# Patient Record
Sex: Female | Born: 1970
Health system: Southern US, Community
[De-identification: ages and names within clinical notes are randomized; demographics above are authoritative.]

## PROBLEM LIST (undated history)

## (undated) DIAGNOSIS — K649 Unspecified hemorrhoids: Secondary | ICD-10-CM

## (undated) DIAGNOSIS — A549 Gonococcal infection, unspecified: Secondary | ICD-10-CM

## (undated) DIAGNOSIS — F32A Depression, unspecified: Secondary | ICD-10-CM

## (undated) DIAGNOSIS — F419 Anxiety disorder, unspecified: Secondary | ICD-10-CM

## (undated) DIAGNOSIS — K219 Gastro-esophageal reflux disease without esophagitis: Secondary | ICD-10-CM

## (undated) DIAGNOSIS — D649 Anemia, unspecified: Secondary | ICD-10-CM

## (undated) DIAGNOSIS — A749 Chlamydial infection, unspecified: Secondary | ICD-10-CM

## (undated) DIAGNOSIS — I10 Essential (primary) hypertension: Secondary | ICD-10-CM

## (undated) DIAGNOSIS — R0600 Dyspnea, unspecified: Secondary | ICD-10-CM

## (undated) DIAGNOSIS — M1990 Unspecified osteoarthritis, unspecified site: Secondary | ICD-10-CM

## (undated) HISTORY — PX: TUBAL LIGATION: SHX77

## (undated) HISTORY — PX: TONSILLECTOMY: SUR1361

## (undated) SURGERY — EGD (ESOPHAGOGASTRODUODENOSCOPY)
Anesthesia: Moderate Sedation

## (undated) NOTE — *Deleted (*Deleted)
HEMATOLOGY/ONCOLOGY CONSULTATION NOTE  Date of Service: 03/21/2020  Patient Care Team: Marcine Matar, MD as PCP - General (Internal Medicine)  CHIEF COMPLAINTS/PURPOSE OF CONSULTATION:  IDA  HISTORY OF PRESENTING ILLNESS:  Elizabeth Bush is a wonderful 31 y.o. female who has been referred to [place] by Dr. Laural Benes for evaluation and management of iron deficiency anemia. Pt is accompanied today by ***. The pt reports that she is doing well overall.   The pt reports ***   Of note prior to the patient's visit today, pt has had *** completed on *** with results revealing ***.   Most recent lab results (***) of CBC is as follows: all values are WNL except for ***.  On review of systems, pt reports *** and denies ***and any other symptoms.   On PMHx the pt reports ***. On Social Hx the pt reports *** On Family Hx the pt reports ***  A: -Discussed patient's most recent labs from ***, *** -***  MEDICAL HISTORY:  Past Medical History:  Diagnosis Date  . Chlamydia   . Gonorrhea   . Hypertension     SURGICAL HISTORY: Past Surgical History:  Procedure Laterality Date  . COLONOSCOPY  05/31/2011   Procedure: COLONOSCOPY;  Surgeon: [name], MD;  Location: [facility];  Service: Endoscopy;  Laterality: N/A;  . ESOPHAGOGASTRODUODENOSCOPY  05/30/2011   Procedure: ESOPHAGOGASTRODUODENOSCOPY (EGD);  Surgeon: [name], MD;  Location: [facility];  Service: Endoscopy;  Laterality: N/A;  . GIVENS CAPSULE STUDY  06/01/2011   Procedure: GIVENS CAPSULE STUDY;  Surgeon: [name], MD;  Location: [facility];  Service: Endoscopy;  Laterality: N/A;  . TONSILLECTOMY    . TUBAL LIGATION      SOCIAL HISTORY: Social History   Socioeconomic History  . Marital status: Single    Spouse name: Not on file  . Number of children: Not on file  . Years of education: Not on file  . Highest education level: Not on file  Occupational History  . Not on file  Tobacco Use  .  Smoking status: Current Some Day Smoker    Packs/day: 0.25    Years: 5.00    Pack years: 1.25    Types: Cigarettes    Last attempt to quit: 11/04/2016    Years since quitting: 3.3  . Smokeless tobacco: Never Used  Substance and Sexual Activity  . Alcohol use: Yes    Alcohol/week: 6.0 standard drinks    Types: 6 Cans of beer per week    Comment: 6 40 oz beers/day  . Drug use: No  . Sexual activity: Yes    Birth control/protection: Other-see comments, Surgical  Other Topics Concern  . Not on file  Social History Narrative  . Not on file   Social Determinants of Health   Financial Resource Strain:   . Difficulty of Paying Living Expenses: Not on file  Food Insecurity: Food Insecurity Present  . Worried About Programme researcher, broadcasting/film/video in the Last Year: Often true  . Ran Out of Food in the Last Year: Often true  Transportation Needs: Unmet Transportation Needs  . Lack of Transportation (Medical): Yes  . Lack of Transportation (Non-Medical): Yes  Physical Activity:   . Days of Exercise per Week: Not on file  . Minutes of Exercise per Session: Not on file  Stress:   . Feeling of Stress : Not on file  Social Connections:   . Frequency of Communication with Friends and Family:  Not on file  . Frequency of Social Gatherings with Friends and Family: Not on file  . Attends Religious Services: Not on file  . Active Member of Clubs or Organizations: Not on file  . Attends Banker Meetings: Not on file  . Marital Status: Not on file  Intimate Partner Violence:   . Fear of Current or Ex-Partner: Not on file  . Emotionally Abused: Not on file  . Physically Abused: Not on file  . Sexually Abused: Not on file    FAMILY HISTORY: Family History  Problem Relation Age of Onset  . Diabetes Mother   . Hypertension Other   . Asthma Other     ALLERGIES:  is allergic to lisinopril.  MEDICATIONS:  Current Outpatient Medications  Medication Sig Dispense Refill  . amLODipine  (NORVASC) 10 MG tablet Take 1 tablet (10 mg total) by mouth daily. 90 tablet 3  . celecoxib (CELEBREX) 200 MG capsule Take 1 capsule (200 mg total) by mouth daily. 30 capsule 3  . DULoxetine (CYMBALTA) 30 MG capsule Take 1 capsule (30 mg total) by mouth daily. 30 capsule 5  . ferrous sulfate 325 (65 FE) MG tablet Take 1 tablet (325 mg total) by mouth daily with breakfast. (Patient taking differently: Take 325 mg by mouth 2 (two) times daily with a meal. ) 100 tablet 3  . omeprazole (PRILOSEC) 20 MG capsule Take 1 capsule (20 mg total) by mouth daily. 30 capsule 3  . PEG-KCl-NaCl-NaSulf-Na Asc-C (PLENVU) 140 g SOLR Take 1 kit by mouth as directed. Manufacturer's coupon Universal coupon code:BIN: G6837245; GROUP: [id number]; PCN: [id number]; ID: [id number]; PAY NO MORE $50 1 each 0   No current facility-administered medications for this visit.    REVIEW OF SYSTEMS:    10 Point review of Systems was done is negative except as noted above.  PHYSICAL EXAMINATION: ECOG PERFORMANCE STATUS: {CHL ONC ECOG NF:[id number]}  .There were no vitals filed for this visit. There were no vitals filed for this visit. .There is no height or weight on file to calculate BMI.  *** GENERAL:alert, in no acute distress and comfortable SKIN: no acute rashes, no significant lesions EYES: conjunctiva are pink and non-injected, sclera anicteric OROPHARYNX: MMM, no exudates, no oropharyngeal erythema or ulceration NECK: supple, no JVD LYMPH:  no palpable lymphadenopathy in the cervical, axillary or inguinal regions LUNGS: clear to auscultation b/l with normal respiratory effort HEART: regular rate & rhythm ABDOMEN:  normoactive bowel sounds , non tender, not distended. Extremity: no pedal edema PSYCH: alert & oriented x 3 with fluent speech NEURO: no focal motor/sensory deficits  LABORATORY DATA:  I have reviewed the data as listed  . CBC Latest Ref Rng & Units 02/28/2020 02/06/2020 10/26/2019  WBC 4.0 - 10.5 K/uL  7.2 7.0 7.2  Hemoglobin 12.0 - 15.0 g/dL 9.2 Repeated and verified X2.(L) 8.9(L) 8.4 Repeated and verified X2.(L)  Hematocrit 36 - 46 % 28.3(L) 27.6(L) 26.5(L)  Platelets 150 - 400 K/uL 197.0 303 262.0    . CMP Latest Ref Rng & Units 10/20/2019 09/15/2019 04/02/2017  Glucose 70 - 99 mg/dL 90 92 89  BUN 6 - 23 mg/dL 7 6 5(L)  Creatinine 4.69 - 1.20 mg/dL 6.29 5.28 4.13(K)  Sodium 135 - 145 mEq/L 138 132(L) 137  Potassium 3.5 - 5.1 mEq/L 3.9 4.5 3.7  Chloride 96 - 112 mEq/L 106 100 101  CO2 19 - 32 mEq/L 24 16(L) 19(L)  Calcium 8.4 - 10.5 mg/dL 9.1 9.0 9.0  Total  Protein 6.0 - 8.3 g/dL 9.0(H) 9.0(H) 9.1(H)  Total Bilirubin 0.2 - 1.2 mg/dL 0.4 0.3 0.3  Alkaline Phos 39 - 117 U/L 93 105 105  AST 0 - 37 U/L 69(H) 91(H) 105(H)  ALT 0 - 35 U/L 20 29 33(H)     RADIOGRAPHIC STUDIES: I have personally reviewed the radiological images as listed and agreed with the findings in the report. No results found.  ASSESSMENT & PLAN:   PLAN: ***   FOLLOW UP: ***  All of the patients questions were answered with apparent satisfaction. The patient knows to call the clinic with any problems, questions or concerns.  I spent *** counseling the patient face to face. The total time spent in the appointment was *** and more than 50% was on counseling and direct patient cares.    Wyvonnia Lora MD MS AAHIVMS [facility] Hematology/Oncology Physician [facility]  (Office):       [phone] (Work cell):  [phone] (Fax):           [phone]  03/21/2020 8:07 AM  I, Carollee Herter, am acting as a scribe for Dr. Wyvonnia Lora.   {Add Production assistant, radio Statement}

---

## 1999-01-17 ENCOUNTER — Inpatient Hospital Stay (HOSPITAL_COMMUNITY): Admission: AD | Admit: 1999-01-17 | Discharge: 1999-01-17 | Payer: Self-pay | Admitting: *Deleted

## 1999-07-02 ENCOUNTER — Other Ambulatory Visit: Admission: RE | Admit: 1999-07-02 | Discharge: 1999-07-02 | Payer: Self-pay | Admitting: Obstetrics

## 2000-09-24 ENCOUNTER — Emergency Department (HOSPITAL_COMMUNITY): Admission: EM | Admit: 2000-09-24 | Discharge: 2000-09-25 | Payer: Self-pay | Admitting: Emergency Medicine

## 2000-10-06 ENCOUNTER — Ambulatory Visit (HOSPITAL_BASED_OUTPATIENT_CLINIC_OR_DEPARTMENT_OTHER): Admission: RE | Admit: 2000-10-06 | Discharge: 2000-10-06 | Payer: Self-pay | Admitting: Orthopedic Surgery

## 2003-06-06 ENCOUNTER — Encounter: Admission: RE | Admit: 2003-06-06 | Discharge: 2003-06-06 | Payer: Self-pay | Admitting: Obstetrics

## 2007-08-24 ENCOUNTER — Emergency Department (HOSPITAL_COMMUNITY): Admission: EM | Admit: 2007-08-24 | Discharge: 2007-08-24 | Payer: Self-pay | Admitting: Emergency Medicine

## 2008-07-21 ENCOUNTER — Emergency Department (HOSPITAL_COMMUNITY): Admission: EM | Admit: 2008-07-21 | Discharge: 2008-07-21 | Payer: Self-pay | Admitting: Emergency Medicine

## 2009-01-18 ENCOUNTER — Emergency Department (HOSPITAL_COMMUNITY): Admission: EM | Admit: 2009-01-18 | Discharge: 2009-01-18 | Payer: Self-pay | Admitting: Emergency Medicine

## 2010-06-07 ENCOUNTER — Encounter: Payer: Self-pay | Admitting: Obstetrics

## 2010-08-22 LAB — POCT URINALYSIS DIP (DEVICE)
Bilirubin Urine: NEGATIVE
Glucose, UA: NEGATIVE mg/dL
Ketones, ur: 15 mg/dL — AB
Nitrite: NEGATIVE
Protein, ur: 100 mg/dL — AB
Specific Gravity, Urine: 1.025 (ref 1.005–1.030)
Urobilinogen, UA: 0.2 mg/dL (ref 0.0–1.0)
pH: 5 (ref 5.0–8.0)

## 2010-08-22 LAB — RAPID URINE DRUG SCREEN, HOSP PERFORMED
Amphetamines: NOT DETECTED
Barbiturates: NOT DETECTED
Benzodiazepines: NOT DETECTED
Cocaine: NOT DETECTED
Opiates: NOT DETECTED
Tetrahydrocannabinol: NOT DETECTED

## 2010-08-22 LAB — POCT I-STAT, CHEM 8
BUN: 4 mg/dL — ABNORMAL LOW (ref 6–23)
Calcium, Ion: 1.05 mmol/L — ABNORMAL LOW (ref 1.12–1.32)
Chloride: 103 mEq/L (ref 96–112)
Creatinine, Ser: 0.5 mg/dL (ref 0.4–1.2)
Glucose, Bld: 75 mg/dL (ref 70–99)
HCT: 35 % — ABNORMAL LOW (ref 36.0–46.0)
Hemoglobin: 11.9 g/dL — ABNORMAL LOW (ref 12.0–15.0)
Potassium: 4.1 mEq/L (ref 3.5–5.1)
Sodium: 135 mEq/L (ref 135–145)
TCO2: 19 mmol/L (ref 0–100)

## 2010-08-22 LAB — TSH: TSH: 1.73 u[IU]/mL (ref 0.350–4.500)

## 2010-08-22 LAB — POCT PREGNANCY, URINE: Preg Test, Ur: NEGATIVE

## 2010-08-28 LAB — POCT URINALYSIS DIP (DEVICE)
Bilirubin Urine: NEGATIVE
Glucose, UA: NEGATIVE mg/dL
Ketones, ur: NEGATIVE mg/dL
Nitrite: NEGATIVE
Protein, ur: 30 mg/dL — AB
Specific Gravity, Urine: <=1.005 (ref 1.005–1.030)
Urobilinogen, UA: 0.2 mg/dL (ref 0.0–1.0)
pH: 5.5 (ref 5.0–8.0)

## 2010-08-28 LAB — POCT PREGNANCY, URINE: Preg Test, Ur: NEGATIVE

## 2010-08-28 LAB — GC/CHLAMYDIA PROBE AMP, GENITAL
Chlamydia, DNA Probe: NEGATIVE
GC Probe Amp, Genital: NEGATIVE

## 2010-10-03 NOTE — Op Note (Signed)
[facility]  Patient:    DEVONE, BONILLA                          MRN: [id number] Proc. Date: 10/06/00 Adm. Date:  [id number] Attending:  Marlowe Shores                           Operative Report  PREOPERATIVE DIAGNOSIS:  Right hand web space laceration.  POSTOPERATIVE DIAGNOSIS:  Right hand web space laceration.  PROCEDURE:  Irrigation, debridement, exploration above wound with repair of first dorsal interosseous muscle.  SURGEON:  Artist Pais. Mina Marble, M.D.  ASSISTANT:  Junius Roads. Ireton, P.A.C.  ANESTHESIA:  General.  TOURNIQUET TIME:  40 minutes.  COMPLICATIONS:  None.  DRAINS:  None.  DESCRIPTION:  Patient taken to the operating room where after the induction of adequate general anesthesia the right upper extremity is prepped and draped in usual sterile fashion.  An Esmarch was used to exsanguinate the limb and tourniquet was inflated to 250 mmHg.  At this point in time a large web space laceration of the thumb on the right hand was explored after the sutures placed in the ER were removed.  Exploration revealed intact ulnar neurovascular bundles.  However, there was significant laceration of the first dorsal interosseus muscle and the adductor to the thumb.  These were repaired using 4-0 Vicryl after adequate debridement of nonviable tissues.  The wound was then thoroughly irrigated and closed loosely with 4-0 nylon in a combination of simple and horizontal mattress sutures.  A sterile dressing of Xeroform, 4x4s, fluffs, and a compressive dressing was applied.  Patient tolerated the procedure well, went to recovery room in stable fashion. DD:  10/06/00 TD:  10/07/00 Job: [id number]

## 2011-02-10 LAB — POCT URINALYSIS DIP (DEVICE)
Bilirubin Urine: NEGATIVE
Glucose, UA: NEGATIVE
Ketones, ur: NEGATIVE
Nitrite: NEGATIVE
Operator id: 247071
Protein, ur: NEGATIVE
Specific Gravity, Urine: 1.015
Urobilinogen, UA: 0.2
pH: 5

## 2011-02-10 LAB — GC/CHLAMYDIA PROBE AMP, GENITAL
Chlamydia, DNA Probe: NEGATIVE
GC Probe Amp, Genital: NEGATIVE

## 2011-02-10 LAB — POCT PREGNANCY, URINE
Operator id: 247071
Preg Test, Ur: NEGATIVE

## 2011-02-10 LAB — WET PREP, GENITAL
Clue Cells Wet Prep HPF POC: NONE SEEN
Trich, Wet Prep: NONE SEEN
Yeast Wet Prep HPF POC: NONE SEEN

## 2011-05-27 ENCOUNTER — Inpatient Hospital Stay (HOSPITAL_COMMUNITY)
Admission: AD | Admit: 2011-05-27 | Discharge: 2011-05-27 | Discharge status: Against Medical Advice | Payer: Self-pay | Source: Ambulatory Visit | Attending: Obstetrics & Gynecology | Admitting: Obstetrics & Gynecology

## 2011-05-27 DIAGNOSIS — K625 Hemorrhage of anus and rectum: Secondary | ICD-10-CM | POA: Insufficient documentation

## 2011-05-27 DIAGNOSIS — M549 Dorsalgia, unspecified: Secondary | ICD-10-CM | POA: Insufficient documentation

## 2011-05-27 DIAGNOSIS — R109 Unspecified abdominal pain: Secondary | ICD-10-CM | POA: Insufficient documentation

## 2011-05-27 LAB — COMPREHENSIVE METABOLIC PANEL
ALT: 48 U/L — ABNORMAL HIGH (ref 0–35)
AST: 91 U/L — ABNORMAL HIGH (ref 0–37)
Albumin: 3.4 g/dL — ABNORMAL LOW (ref 3.5–5.2)
Alkaline Phosphatase: 76 U/L (ref 39–117)
BUN: 3 mg/dL — ABNORMAL LOW (ref 6–23)
CO2: 24 mEq/L (ref 19–32)
Calcium: 9.4 mg/dL (ref 8.4–10.5)
Chloride: 105 mEq/L (ref 96–112)
Creatinine, Ser: 0.56 mg/dL (ref 0.50–1.10)
GFR calc Af Amer: >90 mL/min (ref >90)
GFR calc non Af Amer: >90 mL/min (ref >90)
Glucose, Bld: 92 mg/dL (ref 70–99)
Potassium: 3.5 mEq/L (ref 3.5–5.1)
Sodium: 139 mEq/L (ref 135–145)
Total Bilirubin: 0.2 mg/dL — ABNORMAL LOW (ref 0.3–1.2)
Total Protein: 8.5 g/dL — ABNORMAL HIGH (ref 6.0–8.3)

## 2011-05-27 LAB — DIFFERENTIAL
Basophils Absolute: 0.2 10*3/uL — ABNORMAL HIGH (ref 0.0–0.1)
Basophils Relative: 2 % — ABNORMAL HIGH (ref 0–1)
Eosinophils Absolute: 0.4 10*3/uL (ref 0.0–0.7)
Eosinophils Relative: 5 % (ref 0–5)
Lymphocytes Relative: 46 % (ref 12–46)
Lymphs Abs: 3.6 10*3/uL (ref 0.7–4.0)
Monocytes Absolute: 1 10*3/uL (ref 0.1–1.0)
Monocytes Relative: 13 % — ABNORMAL HIGH (ref 3–12)
Neutro Abs: 2.7 10*3/uL (ref 1.7–7.7)
Neutrophils Relative %: 35 % — ABNORMAL LOW (ref 43–77)

## 2011-05-27 LAB — CBC
HCT: 24.1 % — ABNORMAL LOW (ref 36.0–46.0)
Hemoglobin: 7.3 g/dL — ABNORMAL LOW (ref 12.0–15.0)
MCH: 20.5 pg — ABNORMAL LOW (ref 26.0–34.0)
MCHC: 30.3 g/dL (ref 30.0–36.0)
MCV: 67.7 fL — ABNORMAL LOW (ref 78.0–100.0)
Platelets: 419 10*3/uL — ABNORMAL HIGH (ref 150–400)
RBC: 3.56 MIL/uL — ABNORMAL LOW (ref 3.87–5.11)
RDW: 18.4 % — ABNORMAL HIGH (ref 11.5–15.5)
WBC: 7.9 10*3/uL (ref 4.0–10.5)

## 2011-05-27 LAB — POCT PREGNANCY, URINE: Preg Test, Ur: NEGATIVE

## 2011-05-27 NOTE — Plan of Care (Signed)
Patient is not in the lobby when called to a room. Admission staff state the patient left after the last hourly round.

## 2011-05-27 NOTE — Progress Notes (Signed)
Patient states she has had rectal bleeding for 2 years. States the bleeding is daily and occurs spontaneously and saturates everything she is wearing. States she has had abdominal and back pain for the same length of time but was afraid to be evaluated.

## 2011-05-29 ENCOUNTER — Emergency Department (HOSPITAL_COMMUNITY): Payer: Self-pay

## 2011-05-29 ENCOUNTER — Inpatient Hospital Stay (HOSPITAL_COMMUNITY)
Admission: EM | Admit: 2011-05-29 | Discharge: 2011-06-01 | DRG: 378 | Disposition: A | Discharge status: Home / Self Care | Payer: Self-pay | Attending: Internal Medicine | Admitting: Internal Medicine

## 2011-05-29 ENCOUNTER — Encounter (HOSPITAL_COMMUNITY): Payer: Self-pay

## 2011-05-29 DIAGNOSIS — D649 Anemia, unspecified: Secondary | ICD-10-CM

## 2011-05-29 DIAGNOSIS — K625 Hemorrhage of anus and rectum: Principal | ICD-10-CM | POA: Diagnosis present

## 2011-05-29 DIAGNOSIS — K2980 Duodenitis without bleeding: Secondary | ICD-10-CM | POA: Diagnosis present

## 2011-05-29 DIAGNOSIS — Z91199 Patient's noncompliance with other medical treatment and regimen due to unspecified reason: Secondary | ICD-10-CM

## 2011-05-29 DIAGNOSIS — A499 Bacterial infection, unspecified: Secondary | ICD-10-CM | POA: Diagnosis present

## 2011-05-29 DIAGNOSIS — B9689 Other specified bacterial agents as the cause of diseases classified elsewhere: Secondary | ICD-10-CM | POA: Diagnosis present

## 2011-05-29 DIAGNOSIS — N760 Acute vaginitis: Secondary | ICD-10-CM | POA: Diagnosis present

## 2011-05-29 DIAGNOSIS — E876 Hypokalemia: Secondary | ICD-10-CM | POA: Diagnosis present

## 2011-05-29 DIAGNOSIS — F17200 Nicotine dependence, unspecified, uncomplicated: Secondary | ICD-10-CM | POA: Diagnosis present

## 2011-05-29 DIAGNOSIS — R110 Nausea: Secondary | ICD-10-CM | POA: Diagnosis present

## 2011-05-29 DIAGNOSIS — K644 Residual hemorrhoidal skin tags: Secondary | ICD-10-CM | POA: Diagnosis present

## 2011-05-29 DIAGNOSIS — Z9119 Patient's noncompliance with other medical treatment and regimen: Secondary | ICD-10-CM

## 2011-05-29 DIAGNOSIS — K648 Other hemorrhoids: Secondary | ICD-10-CM | POA: Diagnosis present

## 2011-05-29 DIAGNOSIS — N390 Urinary tract infection, site not specified: Secondary | ICD-10-CM | POA: Diagnosis present

## 2011-05-29 DIAGNOSIS — R109 Unspecified abdominal pain: Secondary | ICD-10-CM | POA: Diagnosis present

## 2011-05-29 DIAGNOSIS — I10 Essential (primary) hypertension: Secondary | ICD-10-CM | POA: Diagnosis present

## 2011-05-29 DIAGNOSIS — F1020 Alcohol dependence, uncomplicated: Secondary | ICD-10-CM | POA: Diagnosis present

## 2011-05-29 DIAGNOSIS — K922 Gastrointestinal hemorrhage, unspecified: Secondary | ICD-10-CM | POA: Diagnosis present

## 2011-05-29 DIAGNOSIS — D62 Acute posthemorrhagic anemia: Secondary | ICD-10-CM | POA: Diagnosis present

## 2011-05-29 HISTORY — DX: Essential (primary) hypertension: I10

## 2011-05-29 LAB — COMPREHENSIVE METABOLIC PANEL
ALT: 54 U/L — ABNORMAL HIGH (ref 0–35)
AST: 96 U/L — ABNORMAL HIGH (ref 0–37)
Albumin: 3.4 g/dL — ABNORMAL LOW (ref 3.5–5.2)
Alkaline Phosphatase: 77 U/L (ref 39–117)
BUN: 4 mg/dL — ABNORMAL LOW (ref 6–23)
CO2: 23 mEq/L (ref 19–32)
Calcium: 9.4 mg/dL (ref 8.4–10.5)
Chloride: 100 mEq/L (ref 96–112)
Creatinine, Ser: 0.65 mg/dL (ref 0.50–1.10)
GFR calc Af Amer: >90 mL/min (ref >90)
GFR calc non Af Amer: >90 mL/min (ref >90)
Glucose, Bld: 123 mg/dL — ABNORMAL HIGH (ref 70–99)
Potassium: 3.4 mEq/L — ABNORMAL LOW (ref 3.5–5.1)
Sodium: 135 mEq/L (ref 135–145)
Total Bilirubin: 0.4 mg/dL (ref 0.3–1.2)
Total Protein: 8.7 g/dL — ABNORMAL HIGH (ref 6.0–8.3)

## 2011-05-29 LAB — PREPARE RBC (CROSSMATCH)

## 2011-05-29 LAB — WET PREP, GENITAL
Trich, Wet Prep: NONE SEEN
Yeast Wet Prep HPF POC: NONE SEEN

## 2011-05-29 LAB — TYPE AND SCREEN
ABO/RH(D): B POS
Antibody Screen: NEGATIVE
Unit division: 0
Unit division: 0

## 2011-05-29 LAB — CBC
HCT: 24.9 % — ABNORMAL LOW (ref 36.0–46.0)
Hemoglobin: 7.6 g/dL — ABNORMAL LOW (ref 12.0–15.0)
MCH: 21.3 pg — ABNORMAL LOW (ref 26.0–34.0)
MCHC: 30.5 g/dL (ref 30.0–36.0)
MCV: 69.9 fL — ABNORMAL LOW (ref 78.0–100.0)
Platelets: 442 10*3/uL — ABNORMAL HIGH (ref 150–400)
RBC: 3.56 MIL/uL — ABNORMAL LOW (ref 3.87–5.11)
RDW: 18 % — ABNORMAL HIGH (ref 11.5–15.5)
WBC: 7.9 10*3/uL (ref 4.0–10.5)

## 2011-05-29 LAB — URINALYSIS, ROUTINE W REFLEX MICROSCOPIC
Bilirubin Urine: NEGATIVE
Glucose, UA: NEGATIVE mg/dL
Ketones, ur: NEGATIVE mg/dL
Nitrite: POSITIVE — AB
Protein, ur: NEGATIVE mg/dL
Specific Gravity, Urine: 1.008 (ref 1.005–1.030)
Urobilinogen, UA: 0.2 mg/dL (ref 0.0–1.0)
pH: 5.5 (ref 5.0–8.0)

## 2011-05-29 LAB — URINE MICROSCOPIC-ADD ON

## 2011-05-29 LAB — ABO/RH: ABO/RH(D): B POS

## 2011-05-29 LAB — PROTIME-INR
INR: 1.04 (ref 0.00–1.49)
Prothrombin Time: 13.8 seconds (ref 11.6–15.2)

## 2011-05-29 LAB — PREGNANCY, URINE: Preg Test, Ur: NEGATIVE

## 2011-05-29 LAB — MAGNESIUM: Magnesium: 1.3 mg/dL — ABNORMAL LOW (ref 1.5–2.5)

## 2011-05-29 LAB — ETHANOL: Alcohol, Ethyl (B): <11 mg/dL (ref 0–11)

## 2011-05-29 MED ORDER — IOHEXOL 300 MG/ML  SOLN
100.0000 mL | Freq: Once | INTRAMUSCULAR | Status: AC | PRN
  Administered 2011-05-29: 100 mL via INTRAVENOUS

## 2011-05-29 MED ORDER — OXYCODONE HCL 5 MG PO TABS
5.0000 mg | ORAL_TABLET | ORAL | Status: DC | PRN
  Administered 2011-05-29: 5 mg via ORAL
  Filled 2011-05-29: qty 1

## 2011-05-29 MED ORDER — LORAZEPAM 1 MG PO TABS: 1.0000 mg | ORAL_TABLET | Freq: Four times a day (QID) | ORAL | Status: AC | PRN

## 2011-05-29 MED ORDER — MORPHINE SULFATE 2 MG/ML IJ SOLN: 2.0000 mg | INTRAMUSCULAR | Status: DC | PRN

## 2011-05-29 MED ORDER — ADULT MULTIVITAMIN W/MINERALS CH
1.0000 | ORAL_TABLET | Freq: Every day | ORAL | Status: DC
  Administered 2011-05-29 – 2011-06-01 (×4): 1 via ORAL
  Filled 2011-05-29 (×5): qty 1

## 2011-05-29 MED ORDER — LORAZEPAM 1 MG PO TABS: 0.0000 mg | ORAL_TABLET | Freq: Two times a day (BID) | ORAL | Status: DC

## 2011-05-29 MED ORDER — FOLIC ACID 1 MG PO TABS
1.0000 mg | ORAL_TABLET | Freq: Every day | ORAL | Status: DC
  Administered 2011-05-29 – 2011-06-01 (×4): 1 mg via ORAL
  Filled 2011-05-29 (×5): qty 1

## 2011-05-29 MED ORDER — LORAZEPAM 1 MG PO TABS: 0.0000 mg | ORAL_TABLET | Freq: Four times a day (QID) | ORAL | Status: AC

## 2011-05-29 MED ORDER — SODIUM CHLORIDE 0.9 % IV SOLN
INTRAVENOUS | Status: DC
  Administered 2011-05-29: 09:00:00 via INTRAVENOUS

## 2011-05-29 MED ORDER — INFLUENZA VIRUS VACC SPLIT PF IM SUSP
0.5000 mL | INTRAMUSCULAR | Status: AC
  Filled 2011-05-29: qty 0.5

## 2011-05-29 MED ORDER — THIAMINE HCL 100 MG/ML IJ SOLN
100.0000 mg | Freq: Every day | INTRAMUSCULAR | Status: DC
  Filled 2011-05-29 (×4): qty 2

## 2011-05-29 MED ORDER — PNEUMOCOCCAL VAC POLYVALENT 25 MCG/0.5ML IJ INJ
0.5000 mL | INJECTION | INTRAMUSCULAR | Status: AC
  Filled 2011-05-29: qty 0.5

## 2011-05-29 MED ORDER — METRONIDAZOLE 500 MG PO TABS
500.0000 mg | ORAL_TABLET | Freq: Two times a day (BID) | ORAL | Status: DC
  Administered 2011-05-29 – 2011-06-01 (×7): 500 mg via ORAL
  Filled 2011-05-29 (×11): qty 1

## 2011-05-29 MED ORDER — HYDRALAZINE HCL 20 MG/ML IJ SOLN
10.0000 mg | Freq: Four times a day (QID) | INTRAMUSCULAR | Status: DC | PRN
  Administered 2011-05-30 – 2011-06-01 (×4): 10 mg via INTRAVENOUS
  Filled 2011-05-29 (×4): qty 1

## 2011-05-29 MED ORDER — VITAMIN B-1 100 MG PO TABS
100.0000 mg | ORAL_TABLET | Freq: Every day | ORAL | Status: DC
  Administered 2011-05-29 – 2011-06-01 (×4): 100 mg via ORAL
  Filled 2011-05-29 (×5): qty 1

## 2011-05-29 MED ORDER — SODIUM CHLORIDE 0.9 % IV SOLN
INTRAVENOUS | Status: DC
  Administered 2011-05-29 – 2011-06-01 (×6): via INTRAVENOUS

## 2011-05-29 MED ORDER — LORAZEPAM 2 MG/ML IJ SOLN: 1.0000 mg | Freq: Four times a day (QID) | INTRAMUSCULAR | Status: AC | PRN

## 2011-05-29 MED ORDER — POTASSIUM CHLORIDE CRYS ER 20 MEQ PO TBCR
40.0000 meq | EXTENDED_RELEASE_TABLET | Freq: Every day | ORAL | Status: DC
  Administered 2011-05-29 – 2011-06-01 (×4): 40 meq via ORAL
  Filled 2011-05-29 (×5): qty 2

## 2011-05-29 MED ORDER — LORAZEPAM 2 MG/ML IJ SOLN
1.0000 mg | Freq: Once | INTRAMUSCULAR | Status: AC
  Administered 2011-05-29: 1 mg via INTRAVENOUS
  Filled 2011-05-29: qty 1

## 2011-05-29 MED ORDER — PANTOPRAZOLE SODIUM 40 MG IV SOLR
40.0000 mg | INTRAVENOUS | Status: DC
  Administered 2011-05-29 – 2011-06-01 (×4): 40 mg via INTRAVENOUS
  Filled 2011-05-29 (×5): qty 40

## 2011-05-29 NOTE — ED Notes (Signed)
Pt alert and oriented x4. Respirations even and unlabored, bilateral symmetrical rise and fall of chest. Skin warm and dry. In no acute distress. Denies needs.   

## 2011-05-29 NOTE — ED Notes (Signed)
Called to give report, rn giving meds, will call back in 5 min

## 2011-05-29 NOTE — ED Notes (Signed)
Dr in room, performing pelvic

## 2011-05-29 NOTE — Progress Notes (Signed)
CM [facility] with K[facility] at [facility] to make appointments for Tuesday january 29 11:30 am for eligibility & Tuesday July 14, 2011 at 10 am with Dr Sherron Flemings for one time hospital follow up

## 2011-05-29 NOTE — H&P (Signed)
Hospital Admission Note Date: 05/29/2011  Patient name: Elizabeth Bush Medical record number: [id number] Date of birth: [birth year] Age: 41 y.o. Gender: female PCP: No primary provider on file.  Attending physician: Kathlen Mody  Chief Complaint:: Increased Rectal Bleeding and Dizziness and Fatigue  History of Present Illness:Elizabeth Bush is a pleasant 41 y/o female with H/O heavy ETOH use in the past 2 years. Pt states that while she had medical insurance 2 years ago she saw her Ob-Gyn Dr. Gaynell Face and at that time was having some occasional rectal bleeding. She was advised by her Ob-Gyn that she had hemorrhoids and so she did not worry with it. She states that the bleeding increased over time but she did not pursue any medical care as she no longer had medical insurance coverage, and she thought that it was just her hemorrhoids. However in the last 2-3 months the volume of blood loss has increased significantly to the point that she stays in bed because when she sits, she bleeds through her underclothes. She also reports dizziness and intense fatigue that has kept her in the bed for the last several months. She also admits that she has been drinking about a 6-pack of beer daily and to excessive use of "Goody Powder". Last ETOH ingestion last night.  Pt designates her sister [name] (who can be reached at [phone]) to speak for her in the even  that she cannot speak for herself. Scheduled Meds:   . folic acid  1 mg Oral Daily  . LORazepam  1 mg Intravenous Once  . LORazepam  0-4 mg Oral Q6H   Followed by  . LORazepam  0-4 mg Oral Q12H  . mulitivitamin with minerals  1 tablet Oral Daily  . pantoprazole (PROTONIX) IV  40 mg Intravenous Q24H  . thiamine  100 mg Oral Daily   Or  . thiamine  100 mg Intravenous Daily   Continuous Infusions:   . sodium chloride 100 mL/hr at 05/29/11 1321  . DISCONTD: sodium chloride Stopped (05/29/11 1251)   PRN Meds:.LORazepam,  LORazepam Allergies: Review of patient's allergies indicates no known allergies. Past Medical History  Diagnosis Date  . Hypertension    Past Surgical History  Procedure Date  . Tubal ligation   . Tonsillectomy    Family History  Problem Relation Age of Onset  . Diabetes Mother    History   Social History  . Marital Status: Single    Spouse Name: N/A    Number of Children: N/A  . Years of Education: N/A   Occupational History  . Not on file.   Social History Main Topics  . Smoking status: Current Some Day Smoker -- 0.2 packs/day for 5 years  . Smokeless tobacco: Never Used  . Alcohol Use: 14.4 oz/week    24 Cans of beer per week  . Drug Use: No  . Sexually Active: Yes    Birth Control/ Protection: Other-see comments   Other Topics Concern  . Not on file   Social History Narrative  . No narrative on file   Review of Systems: A comprehensive review of systems was negative. Physical Exam: No intake or output data in the 24 hours ending 05/29/11 1344 General: Alert, awake, oriented x3, in no acute distress.  HEENT: [place]/AT PEERL, EOMI Neck: Trachea midline,  no masses, no thyromegal,y no JVD, no carotid bruit OROPHARYNX:  Moist, No exudate/ erythema/lesions.  Heart: Regular rate and rhythm, without murmurs, rubs, gallops, PMI non-displaced, no heaves  or thrills on palpation.  Lungs: Clear to auscultation, no wheezing or rhonchi noted. No increased vocal fremitus resonant to percussion  Abdomen: Soft, diffusely tender, nondistended, positive bowel sounds, no masses no hepatosplenomegaly noted..  Neuro: No focal neurological deficits noted cranial nerves II through XII grossly intact. DTRs 2+ bilaterally upper and lower extremities. Strength functional in bilateral upper and lower extremities. Musculoskeletal: No warm swelling or erythema around joints, no spinal tenderness noted. Psychiatric: Patient alert and oriented x3, good insight and cognition, good recent to  remote recall. Lymph node survey: No cervical axillary or inguinal lymphadenopathy noted.  Lab results:  [facility] 05/29/11 0905 05/27/11 1223  NA 135 139  K 3.4* 3.5  CL 100 105  CO2 23 24  GLUCOSE 123* 92  BUN 4* 3*  CREATININE 0.65 0.56  CALCIUM 9.4 9.4  MG -- --  PHOS -- --    Basename 05/29/11 0905 05/27/11 1223  AST 96* 91*  ALT 54* 48*  ALKPHOS 77 76  BILITOT 0.4 0.2*  PROT 8.7* 8.5*  ALBUMIN 3.4* 3.4*   No results found for this basename: LIPASE:2,AMYLASE:2 in the last 72 hours  Basename 05/29/11 0905 05/27/11 1223  WBC 7.9 7.9  NEUTROABS -- 2.7  HGB 7.6* 7.3*  HCT 24.9* 24.1*  MCV 69.9* 67.7*  PLT 442* 419*   No results found for this basename: CKTOTAL:3,CKMB:3,CKMBINDEX:3,TROPONINI:3 in the last 72 hours No components found with this basename: POCBNP:3 No results found for this basename: DDIMER:2 in the last 72 hours No results found for this basename: HGBA1C:2 in the last 72 hours No results found for this basename: CHOL:2,HDL:2,LDLCALC:2,TRIG:2,CHOLHDL:2,LDLDIRECT:2 in the last 72 hours No results found for this basename: TSH,T4TOTAL,FREET3,T3FREE,THYROIDAB in the last 72 hours No results found for this basename: VITAMINB12:2,FOLATE:2,FERRITIN:2,TIBC:2,IRON:2,RETICCTPCT:2 in the last 72 hours Imaging results:  No results found.    Patient Active Hospital Problem List: GIB (gastrointestinal bleeding) (05/29/2011)   Assessment: From the description of her symptoms it appears that patient may be having components of both an upper GIB and  Maybe a lower GIB. There is likely some alcoholic gastritis and effects of NSAIDS .   Plan: Will order CT abdomen and pelvis, and consult GI and transfuse 2 units PRBC's. Also start on Protonix. Pt will likely need endoscopic evaluation.  ETOH dependence (05/29/2011)   Assessment: Treat symptoms of withdrawal with Ativan    Abdominal pain (05/29/2011)   Assessment: Pt reports increase in abdominal girth as well as  generalized abdominal pain. Will get a CT abdomen and pelvis to further evaluate.   Nausea alone (05/29/2011)   Assessment: Treat symptoms with Zofran. GI evaluation   Anemia associated with acute blood loss (05/29/2011)   Assessment: Transfuse 2 units PRBC's as Pt symptomatic. Check Iron levels to evaluate need for replacemnent    Bacterial vaginosis (05/29/2011)   Assessment: Pt has clue cells on wet prep. In light of abdominal pain. Will treat with flagyl.  DVT prophylaxis with SCD's.   MATTHEWS,MICHELLE A. 05/29/2011, 1:44 PM

## 2011-05-29 NOTE — Consult Note (Signed)
[facility] Reason for consult: GI bleeding and anemia Referring Physician: [facility] room. Primary care physician none. Patient is unassigned.  Elizabeth Bush is an 41 y.o. female.  HPI: Patient without previous history of GI bleeding. Has never had EGD or colonoscopy presented to the emergency room with vague abdominal pain dark are maroonish stools. Hemoglobin was 7.6. She notes that she has chronic back pain and takes could easily be sees as well as other things in any chronic continuing basis. Since being in hospital she has not had any further bleeding.  Past Medical History  Diagnosis Date  . Hypertension     Past Surgical History  Procedure Date  . Tubal ligation   . Tonsillectomy     Family History  Problem Relation Age of Onset  . Diabetes Mother     Social History:  reports that she has been smoking.  She has never used smokeless tobacco. She reports that she drinks alcohol. She reports that she does not use illicit drugs.  Allergies: No Known Allergies  Medications;    . folic acid  1 mg Oral Daily  . influenza  inactive virus vaccine  0.5 mL Intramuscular Tomorrow-1000  . LORazepam  1 mg Intravenous Once  . LORazepam  0-4 mg Oral Q6H   Followed by  . LORazepam  0-4 mg Oral Q12H  . metroNIDAZOLE  500 mg Oral Q12H  . mulitivitamin with minerals  1 tablet Oral Daily  . pantoprazole (PROTONIX) IV  40 mg Intravenous Q24H  . pneumococcal 23 valent vaccine  0.5 mL Intramuscular Tomorrow-1000  . potassium chloride  40 mEq Oral Daily  . thiamine  100 mg Oral Daily   Or  . thiamine  100 mg Intravenous Daily   PRN Meds iohexol, LORazepam, LORazepam Results for orders placed during the hospital encounter of 05/29/11 (from the past 48 hour(s))  TYPE AND SCREEN     Status: Normal (Preliminary result)   Collection Time   05/29/11  9:05 AM      Component Value Range Comment   ABO/RH(D) B POS      Antibody Screen NEG      Sample  Expiration 06/01/2011      Unit Number [id number]      Blood Component Type RED CELLS,LR      Unit division 00      Status of Unit ISSUED      Transfusion Status OK TO TRANSFUSE      Crossmatch Result Compatible      Unit Number [id number]      Blood Component Type RED CELLS,LR      Unit division 00      Status of Unit ALLOCATED      Transfusion Status OK TO TRANSFUSE      Crossmatch Result Compatible     COMPREHENSIVE METABOLIC PANEL     Status: Abnormal   Collection Time   05/29/11  9:05 AM      Component Value Range Comment   Sodium 135  135 - 145 (mEq/L)    Potassium 3.4 (*) 3.5 - 5.1 (mEq/L)    Chloride 100  96 - 112 (mEq/L)    CO2 23  19 - 32 (mEq/L)    Glucose, Bld 123 (*) 70 - 99 (mg/dL)    BUN 4 (*) 6 - 23 (mg/dL)    Creatinine, Ser 7.82  0.50 - 1.10 (mg/dL)    Calcium 9.4  8.4 - 10.5 (mg/dL)    Total  Protein 8.7 (*) 6.0 - 8.3 (g/dL)    Albumin 3.4 (*) 3.5 - 5.2 (g/dL)    AST 96 (*) 0 - 37 (U/L)    ALT 54 (*) 0 - 35 (U/L)    Alkaline Phosphatase 77  39 - 117 (U/L)    Total Bilirubin 0.4  0.3 - 1.2 (mg/dL)    GFR calc non Af Amer >90  >90 (mL/min)    GFR calc Af Amer >90  >90 (mL/min)   CBC     Status: Abnormal   Collection Time   05/29/11  9:05 AM      Component Value Range Comment   WBC 7.9  4.0 - 10.5 (K/uL)    RBC 3.56 (*) 3.87 - 5.11 (MIL/uL)    Hemoglobin 7.6 (*) 12.0 - 15.0 (g/dL)    HCT 16.1 (*) 09.6 - 46.0 (%)    MCV 69.9 (*) 78.0 - 100.0 (fL)    MCH 21.3 (*) 26.0 - 34.0 (pg)    MCHC 30.5  30.0 - 36.0 (g/dL)    RDW 04.5 (*) 40.9 - 15.5 (%)    Platelets 442 (*) 150 - 400 (K/uL)   PROTIME-INR     Status: Normal   Collection Time   05/29/11  9:05 AM      Component Value Range Comment   Prothrombin Time 13.8  11.6 - 15.2 (seconds)    INR 1.04  0.00 - 1.49    ABO/RH     Status: Normal   Collection Time   05/29/11  9:05 AM      Component Value Range Comment   ABO/RH(D) B POS     PREGNANCY, URINE     Status: Normal   Collection Time   05/29/11  9:28 AM       Component Value Range Comment   Preg Test, Ur NEGATIVE     WET PREP, GENITAL     Status: Abnormal   Collection Time   05/29/11  9:50 AM      Component Value Range Comment   Yeast, Wet Prep NONE SEEN  NONE SEEN     Trich, Wet Prep NONE SEEN  NONE SEEN     Clue Cells, Wet Prep FEW (*) NONE SEEN     WBC, Wet Prep HPF POC FEW (*) NONE SEEN    ETHANOL     Status: Normal   Collection Time   05/29/11  1:35 PM      Component Value Range Comment   Alcohol, Ethyl (B) <11  0 - 11 (mg/dL)   MAGNESIUM     Status: Abnormal   Collection Time   05/29/11  2:15 PM      Component Value Range Comment   Magnesium 1.3 (*) 1.5 - 2.5 (mg/dL)   URINALYSIS, ROUTINE W REFLEX MICROSCOPIC     Status: Abnormal   Collection Time   05/29/11  2:24 PM      Component Value Range Comment   Color, Urine YELLOW  YELLOW     APPearance CLOUDY (*) CLEAR     Specific Gravity, Urine 1.008  1.005 - 1.030     pH 5.5  5.0 - 8.0     Glucose, UA NEGATIVE  NEGATIVE (mg/dL)    Hgb urine dipstick TRACE (*) NEGATIVE     Bilirubin Urine NEGATIVE  NEGATIVE     Ketones, ur NEGATIVE  NEGATIVE (mg/dL)    Protein, ur NEGATIVE  NEGATIVE (mg/dL)    Urobilinogen, UA 0.2  0.0 - 1.0 (  mg/dL)    Nitrite POSITIVE (*) NEGATIVE     Leukocytes, UA LARGE (*) NEGATIVE    URINE MICROSCOPIC-ADD ON     Status: Abnormal   Collection Time   05/29/11  2:24 PM      Component Value Range Comment   Squamous Epithelial / LPF MANY (*) RARE     WBC, UA 21-50  <3 (WBC/hpf)    RBC / HPF 0-2  <3 (RBC/hpf)    Bacteria, UA MANY (*) RARE     Casts GRANULAR CAST (*) NEGATIVE      Ct Abdomen Pelvis W Contrast  05/29/2011  *RADIOLOGY REPORT*  Clinical Data: Abdominal pain.  CT ABDOMEN AND PELVIS WITH CONTRAST  Technique:  Multidetector CT imaging of the abdomen and pelvis was performed following the standard protocol during bolus administration of intravenous contrast.  Contrast: OMNIPAQUE IOHEXOL 300 MG/ML IV SOLN  Comparison: None.  Findings: Lung bases  are clear.  No effusions.  Heart is normal size.  Liver, spleen, pancreas, gallbladder, adrenals and kidneys are unremarkable.  Small exophytic cyst off the superior pole of the left kidney which appears benign.  No hydronephrosis or stones.  No biliary ductal dilatation.  There are a few scattered descending colonic and sigmoid diverticula.  No active diverticulitis.  Uterus is enlarged and heterogeneous, likely with large fibroid in the fundus.  3.2 cm left ovarian cyst.  Right ovary unremarkable.  Prior appendectomy.  Small bowel is decompressed.  No free fluid, free air or adenopathy.  Aorta is normal caliber.  No acute bony abnormality.  IMPRESSION: No acute findings in the abdomen or pelvis.  Scattered left colonic diverticula.  Original Report Authenticated By: Cyndie Chime, M.D.   Linus OrnWynona Meals problem appears to be chronic back pain Cardiovascular-hypertension no heart trouble Pulmonary-negative GI-chronic problems no prior EGD or colonoscopy  Musculoskeletal-no chronic problems other than chronic low back pain           Blood pressure 157/105, pulse 92, temperature 98.4 F (36.9 C), temperature source Oral, resp. rate 16, height 5\' 7"  (1.702 m), weight 97.3 kg (214 lb 8.1 oz), SpO2 100.00%.  Physical exam:   Gen.-obese African American female in acute distress Eyes-sclerae are nonicteric Lungs-clear anteriorly and posteriorly Abdomen-mild epigastric tenderness  Assessment: 1. Anemia/positive stool. 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 She admits to taking this nearly every day .  Plan: 1. We'll increase her Protonix to twice a day and arrange endoscopy tomorrow by Dr. Dulce Sellar. Have discussed the procedure including the risk and benefits with the patient.   Terease Marcotte JR,Bronc Brosseau L 05/29/2011, 6:11 PM

## 2011-05-29 NOTE — ED Notes (Signed)
Pt. Reports that she has had rectal bleeding x 2 years, but has increased significantly in the past 2 months. Pt. Having dark red bleeding with clots. Pt .also c/o dizziness.

## 2011-05-29 NOTE — Progress Notes (Signed)
ED CM spoke with pt Confirmed she is self [place] resident with no pcp. Pt states she previously been with [facility] but not recently She agrees to [facility] referral to assist with eligibility Pt also want to check on [facility].

## 2011-05-29 NOTE — ED Provider Notes (Signed)
History     CSN: [id number]  Arrival date & time 05/29/11  7829   First MD Initiated Contact with Patient 05/29/11 0840      Chief Complaint  Patient presents with  . Rectal Bleeding  . Abdominal Pain    (Consider location/radiation/quality/duration/timing/severity/associated sxs/prior treatment) Patient is a 41 y.o. female presenting with hematochezia and abdominal pain. The history is provided by the patient.  Rectal Bleeding  Associated symptoms include abdominal pain. Pertinent negatives include no fever, no hematuria, no vaginal bleeding, no vaginal discharge, no chest pain, no headaches and no rash.  Abdominal Pain The primary symptoms of the illness include abdominal pain and hematochezia. The primary symptoms of the illness do not include fever, shortness of breath, dysuria, vaginal discharge or vaginal bleeding.  Symptoms associated with the illness do not include chills, hematuria or back pain.  pt c/o rectal bleeding intermittently for past 2 years. States has increased in past 2 months. Dark red blood. Denies rectal pain or injury. States remote hx hemorrhoids. No hx gi bleeding, ulcer disease or diverticula. No nsaid, asa or anticoagulant use. States has been told is anemic in past, but no hx transfusion or iron therapy. States when stands feels sl lightheaded. No syncope. No chest pain or sob. No other abnormal bleeding or bruising. States is unsure when last period was, but states does have regular periods. Denies unusual vaginal bleeding or hx fibroids. No flank or abd pain. No fever or chills. No nvd. No constipation or straining. Pt denies specific exacerbating or alleviating factors related to symptoms.  Past Medical History  Diagnosis Date  . Hypertension     Past Surgical History  Procedure Date  . Tubal ligation   . Tonsillectomy     Family History  Problem Relation Age of Onset  . Diabetes Other     History  Substance Use Topics  . Smoking status:  Current Some Day Smoker -- 0.5 packs/day for 5 years  . Smokeless tobacco: Never Used  . Alcohol Use: 14.4 oz/week    24 Cans of beer per week    OB History    Grav Para Term Preterm Abortions TAB SAB Ect Mult Living                  Review of Systems  Constitutional: Negative for fever and chills.  HENT: Negative for neck pain.   Eyes: Negative for redness.  Respiratory: Negative for shortness of breath.   Cardiovascular: Negative for chest pain.  Gastrointestinal: Positive for abdominal pain and hematochezia.  Genitourinary: Negative for dysuria, hematuria, flank pain, vaginal bleeding and vaginal discharge.  Musculoskeletal: Negative for back pain.  Skin: Negative for rash.  Neurological: Negative for headaches.  Hematological: Does not bruise/bleed easily.  Psychiatric/Behavioral: Negative for confusion.    Allergies  Review of patient's allergies indicates not on file.  Home Medications  No current outpatient prescriptions on file.  BP 168/86  Pulse 102  Temp(Src) 97.9 F (36.6 C) (Oral)  Resp 20  SpO2 100%  Physical Exam  Nursing note and vitals reviewed. Constitutional: She appears well-developed and well-nourished. No distress.  HENT:  Head: Atraumatic.  Mouth/Throat: Oropharynx is clear and moist.  Eyes: Conjunctivae are normal. No scleral icterus.  Neck: Normal range of motion. Neck supple. No tracheal deviation present.  Cardiovascular: Normal rate, regular rhythm, normal heart sounds and intact distal pulses.  Exam reveals no gallop and no friction rub.   No murmur heard. Pulmonary/Chest: Effort normal and breath  sounds normal. No respiratory distress.  Abdominal: Soft. Normal appearance and bowel sounds are normal. She exhibits no distension and no mass. There is no tenderness. There is no rebound and no guarding.  Genitourinary:       Rectal exam, no stool in vault only scant mucous/streak brb, that does test hemoccult positive. Small skin tag/?old  hemorrhoid, but no acutely thrombosed or bleeding hemorrhoids seen. No rectal mass. Mild whitish vaginal discharge. No vaginal blood/bleeding. No cmt. No adx masses/tenderness.   Musculoskeletal: She exhibits no edema and no tenderness.  Neurological: She is alert.  Skin: Skin is warm and dry. No rash noted.  Psychiatric: She has a normal mood and affect.    ED Course  Procedures (including critical care time)   Labs Reviewed  TYPE AND SCREEN  COMPREHENSIVE METABOLIC PANEL  CBC  PROTIME-INR  PREGNANCY, URINE   Results for orders placed during the hospital encounter of 05/29/11  TYPE AND SCREEN      Component Value Range   ABO/RH(D) B POS     Antibody Screen NEG     Sample Expiration 06/01/2011    COMPREHENSIVE METABOLIC PANEL      Component Value Range   Sodium 135  135 - 145 (mEq/L)   Potassium 3.4 (*) 3.5 - 5.1 (mEq/L)   Chloride 100  96 - 112 (mEq/L)   CO2 23  19 - 32 (mEq/L)   Glucose, Bld 123 (*) 70 - 99 (mg/dL)   BUN 4 (*) 6 - 23 (mg/dL)   Creatinine, Ser 1.61  0.50 - 1.10 (mg/dL)   Calcium 9.4  8.4 - 09.6 (mg/dL)   Total Protein 8.7 (*) 6.0 - 8.3 (g/dL)   Albumin 3.4 (*) 3.5 - 5.2 (g/dL)   AST 96 (*) 0 - 37 (U/L)   ALT 54 (*) 0 - 35 (U/L)   Alkaline Phosphatase 77  39 - 117 (U/L)   Total Bilirubin 0.4  0.3 - 1.2 (mg/dL)   GFR calc non Af Amer >90  >90 (mL/min)   GFR calc Af Amer >90  >90 (mL/min)  CBC      Component Value Range   WBC 7.9  4.0 - 10.5 (K/uL)   RBC 3.56 (*) 3.87 - 5.11 (MIL/uL)   Hemoglobin 7.6 (*) 12.0 - 15.0 (g/dL)   HCT 04.5 (*) 40.9 - 46.0 (%)   MCV 69.9 (*) 78.0 - 100.0 (fL)   MCH 21.3 (*) 26.0 - 34.0 (pg)   MCHC 30.5  30.0 - 36.0 (g/dL)   RDW 81.1 (*) 91.4 - 15.5 (%)   Platelets 442 (*) 150 - 400 (K/uL)  PROTIME-INR      Component Value Range   Prothrombin Time 13.8  11.6 - 15.2 (seconds)   INR 1.04  0.00 - 1.49   PREGNANCY, URINE      Component Value Range   Preg Test, Ur NEGATIVE    WET PREP, GENITAL      Component Value Range     Yeast, Wet Prep NONE SEEN  NONE SEEN    Trich, Wet Prep NONE SEEN  NONE SEEN    Clue Cells, Wet Prep FEW (*) NONE SEEN    WBC, Wet Prep HPF POC FEW (*) NONE SEEN   ABO/RH      Component Value Range   ABO/RH(D) B POS         MDM  Prior charts and prior labs reviewed. Recent cbc with hemoglobin 7 noted. As pt unclear on rectal bleeding/periods/vaginal bleeding, will  do pelvic exam.   Nursing notes reviewed. Vitals reviewed.   Orthostatic vital signs checked.   Given symptomatic anemia, rectal bleeding, triad called to admit. They indicate admit to gen bed to team 1.         Suzi Roots, MD 05/29/11 1249

## 2011-05-30 ENCOUNTER — Encounter (HOSPITAL_COMMUNITY)
Admission: EM | Disposition: A | Discharge status: Home / Self Care | Payer: Self-pay | Source: Home / Self Care | Attending: Internal Medicine

## 2011-05-30 HISTORY — PX: ESOPHAGOGASTRODUODENOSCOPY: SHX5428

## 2011-05-30 LAB — URINE CULTURE
Colony Count: >=100000
Culture  Setup Time: 201301131209

## 2011-05-30 LAB — URINE MICROSCOPIC-ADD ON

## 2011-05-30 LAB — URINALYSIS, ROUTINE W REFLEX MICROSCOPIC
Bilirubin Urine: NEGATIVE
Glucose, UA: NEGATIVE mg/dL
Ketones, ur: NEGATIVE mg/dL
Nitrite: NEGATIVE
Protein, ur: NEGATIVE mg/dL
Specific Gravity, Urine: 1.003 — ABNORMAL LOW (ref 1.005–1.030)
Urobilinogen, UA: 0.2 mg/dL (ref 0.0–1.0)
pH: 6.5 (ref 5.0–8.0)

## 2011-05-30 LAB — DIFFERENTIAL
Basophils Absolute: 0.1 10*3/uL (ref 0.0–0.1)
Basophils Relative: 1 % (ref 0–1)
Eosinophils Absolute: 0.3 10*3/uL (ref 0.0–0.7)
Eosinophils Relative: 4 % (ref 0–5)
Lymphocytes Relative: 26 % (ref 12–46)
Lymphs Abs: 1.7 10*3/uL (ref 0.7–4.0)
Monocytes Absolute: 0.8 10*3/uL (ref 0.1–1.0)
Monocytes Relative: 12 % (ref 3–12)
Neutro Abs: 3.7 10*3/uL (ref 1.7–7.7)
Neutrophils Relative %: 57 % (ref 43–77)

## 2011-05-30 LAB — CBC
HCT: 27.9 % — ABNORMAL LOW (ref 36.0–46.0)
Hemoglobin: 8.9 g/dL — ABNORMAL LOW (ref 12.0–15.0)
MCH: 23.1 pg — ABNORMAL LOW (ref 26.0–34.0)
MCHC: 31.9 g/dL (ref 30.0–36.0)
MCV: 72.3 fL — ABNORMAL LOW (ref 78.0–100.0)
Platelets: 376 10*3/uL (ref 150–400)
RBC: 3.86 MIL/uL — ABNORMAL LOW (ref 3.87–5.11)
RDW: 18.9 % — ABNORMAL HIGH (ref 11.5–15.5)
WBC: 6.5 10*3/uL (ref 4.0–10.5)

## 2011-05-30 LAB — GC/CHLAMYDIA PROBE AMP, GENITAL
Chlamydia, DNA Probe: NEGATIVE
GC Probe Amp, Genital: NEGATIVE

## 2011-05-30 LAB — PREGNANCY, URINE: Preg Test, Ur: NEGATIVE

## 2011-05-30 SURGERY — EGD (ESOPHAGOGASTRODUODENOSCOPY)
Anesthesia: Moderate Sedation

## 2011-05-30 MED ORDER — FENTANYL CITRATE 0.05 MG/ML IJ SOLN
INTRAMUSCULAR | Status: AC
  Filled 2011-05-30: qty 2

## 2011-05-30 MED ORDER — PEG 3350-KCL-NA BICARB-NACL 420 G PO SOLR
4000.0000 mL | Freq: Once | ORAL | Status: AC
  Administered 2011-05-30: 4000 mL via ORAL
  Filled 2011-05-30: qty 4000

## 2011-05-30 MED ORDER — MIDAZOLAM HCL 10 MG/2ML IJ SOLN
INTRAMUSCULAR | Status: AC
  Filled 2011-05-30: qty 2

## 2011-05-30 MED ORDER — SODIUM CHLORIDE 0.9 % IV SOLN: Freq: Once | INTRAVENOUS | Status: DC

## 2011-05-30 MED ORDER — MIDAZOLAM HCL 10 MG/2ML IJ SOLN
INTRAMUSCULAR | Status: DC | PRN
  Administered 2011-05-30 (×5): 2 mg via INTRAVENOUS

## 2011-05-30 MED ORDER — BUTAMBEN-TETRACAINE-BENZOCAINE 2-2-14 % EX AERO
INHALATION_SPRAY | CUTANEOUS | Status: DC | PRN
  Administered 2011-05-30: 2 via TOPICAL

## 2011-05-30 MED ORDER — BISACODYL 5 MG PO TBEC
10.0000 mg | DELAYED_RELEASE_TABLET | Freq: Once | ORAL | Status: AC
  Administered 2011-05-30: 10 mg via ORAL
  Filled 2011-05-30: qty 2

## 2011-05-30 MED ORDER — FENTANYL CITRATE 0.05 MG/ML IJ SOLN
INTRAMUSCULAR | Status: DC | PRN
  Administered 2011-05-30 (×4): 25 ug via INTRAVENOUS

## 2011-05-30 NOTE — Brief Op Note (Signed)
Please see EndoPro note dated 05/30/11.  

## 2011-05-30 NOTE — Op Note (Signed)
[facility] [address] [place], [place]  [address]  ENDOSCOPY PROCEDURE REPORT  PATIENT:  Elizabeth Bush, Elizabeth Bush  MR#:  [id number] BIRTHDATE:  December 01, 1970, 40 yrs. old  GENDER:  female ENDOSCOPIST:  Willis Modena, MD Referred by:  Marthann Schiller, MD PROCEDURE DATE:  05/30/2011 PROCEDURE:  EGD, diagnostic 96295 ASA CLASS:  Class II INDICATIONS:  melena, anemia, abdominal pain MEDICATIONS:  Cetacaine spray x 2, Fentanyl 100 mcg IV, Versed 10 mg IV DESCRIPTION OF PROCEDURE:   After the risks benefits and alternatives of the procedure were thoroughly explained, informed consent was obtained.  The Pentax Gastroscope [id number] endoscope was introduced through the mouth and advanced to the , without limitations.  The instrument was slowly withdrawn as the mucosa was fully examined.  <<PROCEDUREIMAGES>>  FINDINGS:  Small hiatal hernia, otherwise normal esophagus. Couple small antral erosions, otherwise normal stomach.  Mild bulbar duodenitis, otherwise normal duodenum to the second portion.  ENDOSCOPIC IMPRESSION:    1.  Small antral erosions and mild bulbar duodenitis. 2.  Otherwise normal endoscopy; no source of melena or anemia was identified.  RECOMMENDATIONS:      1.  Watch for potential complications of procedure. 2.  PPI therapy, clear liquids only. 3.  Colonoscopy tomorrow for further evaluation.  REPEAT EXAM:  No  ______________________________ Willis Modena  CC:  n. eSIGNEDWillis Modena at 05/30/2011 08:31 AM  Williemae Area, [phone]

## 2011-05-30 NOTE — Interval H&P Note (Signed)
History and Physical Interval Note:  05/30/2011 7:59 AM  Elizabeth Bush  has presented today for surgery, with the diagnosis of abdominal pain, melena  The various methods of treatment have been discussed with the patient and family. After consideration of risks, benefits and other options for treatment, the patient has consented to  Procedure(s): ESOPHAGOGASTRODUODENOSCOPY (EGD) as a surgical intervention .  The patients' history has been reviewed, patient examined, no change in status, stable for surgery.  I have reviewed the patients' chart and labs.  Questions were answered to the patient's satisfaction.     [name]  Risks (bleeding, infection, bowel perforation that could require surgery, sedation-related changes in cardiopulmonary systems), benefits (identification and possible treatment of source of symptoms, exclusion of certain causes of symptoms), and alternatives (watchful waiting, radiographic imaging studies, empiric medical treatment) of upper endoscopy (EGD) were explained to patient in detail and patient wishes to proceed.

## 2011-05-30 NOTE — Progress Notes (Signed)
Subjective: Patient had EGD today.  She is disappointed about being able to be discharged today.  Objective: Vital signs in last 24 hours: Filed Vitals:   05/30/11 0835 05/30/11 0840 05/30/11 0900 05/30/11 1434  BP: 152/103 144/94 138/93 154/95  Pulse:   91 83  Temp:   98.2 F (36.8 C) 98.6 F (37 C)  TempSrc:   Oral Oral  Resp: 22 24 20 18   Height:      Weight:      SpO2: 100% 99% 99% 100%   Weight change:   Intake/Output Summary (Last 24 hours) at 05/30/11 1520 Last data filed at 05/30/11 1400  Gross per 24 hour  Intake 2267.5 ml  Output   1600 ml  Net  667.5 ml    Physical Exam: General: Awake, Oriented, No acute distress. HEENT: EOMI. Neck: Supple CV: S1 and S2 Lungs: Clear to ascultation bilaterally Abdomen: Soft, Nontender, Nondistended, +bowel sounds. Ext: Good pulses. Trace edema.  Lab Results:  Basename 05/29/11 1415 05/29/11 0905  NA -- 135  K -- 3.4*  CL -- 100  CO2 -- 23  GLUCOSE -- 123*  BUN -- 4*  CREATININE -- 0.65  CALCIUM -- 9.4  MG 1.3* --  PHOS -- --    Basename 05/29/11 0905  AST 96*  ALT 54*  ALKPHOS 77  BILITOT 0.4  PROT 8.7*  ALBUMIN 3.4*   No results found for this basename: LIPASE:2,AMYLASE:2 in the last 72 hours  Basename 05/30/11 0415 05/29/11 0905  WBC 6.5 7.9  NEUTROABS 3.7 --  HGB 8.9* 7.6*  HCT 27.9* 24.9*  MCV 72.3* 69.9*  PLT 376 442*   No results found for this basename: CKTOTAL:3,CKMB:3,CKMBINDEX:3,TROPONINI:3 in the last 72 hours No components found with this basename: POCBNP:3 No results found for this basename: DDIMER:2 in the last 72 hours No results found for this basename: HGBA1C:2 in the last 72 hours No results found for this basename: CHOL:2,HDL:2,LDLCALC:2,TRIG:2,CHOLHDL:2,LDLDIRECT:2 in the last 72 hours No results found for this basename: TSH,T4TOTAL,FREET3,T3FREE,THYROIDAB in the last 72 hours No results found for this basename: VITAMINB12:2,FOLATE:2,FERRITIN:2,TIBC:2,IRON:2,RETICCTPCT:2 in the  last 72 hours  Micro Results: Recent Results (from the past 240 hour(s))  WET PREP, GENITAL     Status: Abnormal   Collection Time   05/29/11  9:50 AM      Component Value Range Status Comment   Yeast, Wet Prep NONE SEEN  NONE SEEN  Final    Trich, Wet Prep NONE SEEN  NONE SEEN  Final    Clue Cells, Wet Prep FEW (*) NONE SEEN  Final    WBC, Wet Prep HPF POC FEW (*) NONE SEEN  Final     Studies/Results: Ct Abdomen Pelvis W Contrast  05/29/2011  *RADIOLOGY REPORT*  Clinical Data: Abdominal pain.  CT ABDOMEN AND PELVIS WITH CONTRAST  Technique:  Multidetector CT imaging of the abdomen and pelvis was performed following the standard protocol during bolus administration of intravenous contrast.  Contrast: OMNIPAQUE IOHEXOL 300 MG/ML IV SOLN  Comparison: None.  Findings: Lung bases are clear.  No effusions.  Heart is normal size.  Liver, spleen, pancreas, gallbladder, adrenals and kidneys are unremarkable.  Small exophytic cyst off the superior pole of the left kidney which appears benign.  No hydronephrosis or stones.  No biliary ductal dilatation.  There are a few scattered descending colonic and sigmoid diverticula.  No active diverticulitis.  Uterus is enlarged and heterogeneous, likely with large fibroid in the fundus.  3.2 cm left ovarian  cyst.  Right ovary unremarkable.  Prior appendectomy.  Small bowel is decompressed.  No free fluid, free air or adenopathy.  Aorta is normal caliber.  No acute bony abnormality.  IMPRESSION: No acute findings in the abdomen or pelvis.  Scattered left colonic diverticula.  Original Report Authenticated By: Cyndie Chime, M.D.    Medications: I have reviewed the patient's current medications. Scheduled Meds:   . bisacodyl  10 mg Oral Once  . folic acid  1 mg Oral Daily  . influenza  inactive virus vaccine  0.5 mL Intramuscular Tomorrow-1000  . LORazepam  0-4 mg Oral Q6H   Followed by  . LORazepam  0-4 mg Oral Q12H  . metroNIDAZOLE  500 mg Oral Q12H    . mulitivitamin with minerals  1 tablet Oral Daily  . pantoprazole (PROTONIX) IV  40 mg Intravenous Q24H  . pneumococcal 23 valent vaccine  0.5 mL Intramuscular Tomorrow-1000  . polyethylene glycol-electrolytes  4,000 mL Oral Once  . potassium chloride  40 mEq Oral Daily  . thiamine  100 mg Oral Daily   Or  . thiamine  100 mg Intravenous Daily  . DISCONTD: sodium chloride   Intravenous Once   Continuous Infusions:   . sodium chloride 100 mL/hr at 05/30/11 1440   PRN Meds:.hydrALAZINE, LORazepam, LORazepam, oxyCODONE, DISCONTD: butamben-tetracaine-benzocaine, DISCONTD: fentaNYL, DISCONTD: midazolam, DISCONTD:  morphine injection  Assessment/Plan: 1. GIB (gastrointestinal bleeding).  Status post EGD on 05/30/2011 which showed small antral erosions and mild bulbar duodenitis otherwise normal EGD, nor source of melena or anemia identified.  Plan for colonoscopy on 05/31/2011.  CT of the abdomen and pelvis on 05/29/2011 showed no acute findings in the abdomen and pelvis.  Continue pantoprazole.  2.  Alcohol dependence.  Patient is on CIWA protocol.  Social worker consult to offer the patient resources on alcohol cessation.  Continue thiamine and folic acid.  3.  Abdominal pain.  Currently denies any abdominal pain.  CT of the abdomen pelvis with results as indicated above.  4.  Acute blood loss anemia.  Status post 2 units of PRBC.  Likely due to #1.  5.  Bacterial vaginosis.  Currently on metronidazole.  Antibiotics since 05/29/2011.  6.  Questionable urinary tract infection.  Sample dirty as indicated by many squamous epithelial cells. Will resend for urine analysis.  7.  Prophylaxis.  SCDs and PPI.  8.  Disposition.  Pending.   LOS: 1 day  Amber Williard A, MD 05/30/2011, 3:20 PM

## 2011-05-30 NOTE — Progress Notes (Signed)
Cm spoke with pt concerning d/c planning. Cm in ER made pt follow-up at [facility] for Feb 26th 2013. Pt eligible for medication assistance upon discharge. Pt informed about [facility] uninsured program. Cm to fax referral to Johney Frame intake specialist. Pt given target generic drug list. Pt informed [facility] also has generic drugs for same price. Pt agreeable with plan of care.  Elizabeth Bush [phone]

## 2011-05-31 ENCOUNTER — Encounter (HOSPITAL_COMMUNITY)
Admission: EM | Disposition: A | Discharge status: Home / Self Care | Payer: Self-pay | Source: Home / Self Care | Attending: Internal Medicine

## 2011-05-31 ENCOUNTER — Encounter (HOSPITAL_COMMUNITY): Payer: Self-pay | Admitting: *Deleted

## 2011-05-31 DIAGNOSIS — I10 Essential (primary) hypertension: Secondary | ICD-10-CM | POA: Diagnosis present

## 2011-05-31 DIAGNOSIS — E876 Hypokalemia: Secondary | ICD-10-CM | POA: Diagnosis not present

## 2011-05-31 HISTORY — PX: COLONOSCOPY: SHX5424

## 2011-05-31 LAB — BASIC METABOLIC PANEL
BUN: <3 mg/dL — ABNORMAL LOW (ref 6–23)
CO2: 24 mEq/L (ref 19–32)
Calcium: 8.8 mg/dL (ref 8.4–10.5)
Chloride: 106 mEq/L (ref 96–112)
Creatinine, Ser: 0.68 mg/dL (ref 0.50–1.10)
GFR calc Af Amer: >90 mL/min (ref >90)
GFR calc non Af Amer: >90 mL/min (ref >90)
Glucose, Bld: 94 mg/dL (ref 70–99)
Potassium: 2.9 mEq/L — ABNORMAL LOW (ref 3.5–5.1)
Sodium: 139 mEq/L (ref 135–145)

## 2011-05-31 LAB — CBC
HCT: 27.9 % — ABNORMAL LOW (ref 36.0–46.0)
Hemoglobin: 8.7 g/dL — ABNORMAL LOW (ref 12.0–15.0)
MCH: 22.7 pg — ABNORMAL LOW (ref 26.0–34.0)
MCHC: 31.2 g/dL (ref 30.0–36.0)
MCV: 72.8 fL — ABNORMAL LOW (ref 78.0–100.0)
Platelets: 356 10*3/uL (ref 150–400)
RBC: 3.83 MIL/uL — ABNORMAL LOW (ref 3.87–5.11)
RDW: 19.4 % — ABNORMAL HIGH (ref 11.5–15.5)
WBC: 6.6 10*3/uL (ref 4.0–10.5)

## 2011-05-31 LAB — POTASSIUM: Potassium: 3.5 mEq/L (ref 3.5–5.1)

## 2011-05-31 SURGERY — COLONOSCOPY
Anesthesia: Moderate Sedation

## 2011-05-31 MED ORDER — MIDAZOLAM HCL 10 MG/2ML IJ SOLN
INTRAMUSCULAR | Status: AC
  Filled 2011-05-31: qty 4

## 2011-05-31 MED ORDER — MAGNESIUM SULFATE 40 MG/ML IJ SOLN: 2.0000 g | Freq: Once | INTRAMUSCULAR | Status: DC

## 2011-05-31 MED ORDER — POTASSIUM CHLORIDE 10 MEQ/100ML IV SOLN
10.0000 meq | INTRAVENOUS | Status: AC
  Administered 2011-05-31 (×6): 10 meq via INTRAVENOUS
  Filled 2011-05-31 (×6): qty 100

## 2011-05-31 MED ORDER — MIDAZOLAM HCL 5 MG/5ML IJ SOLN
INTRAMUSCULAR | Status: DC | PRN
  Administered 2011-05-31 (×5): 2 mg via INTRAVENOUS

## 2011-05-31 MED ORDER — FENTANYL NICU IV SYRINGE 50 MCG/ML
INJECTION | INTRAMUSCULAR | Status: DC | PRN
  Administered 2011-05-31 (×4): 25 ug via INTRAVENOUS

## 2011-05-31 MED ORDER — FENTANYL CITRATE 0.05 MG/ML IJ SOLN
INTRAMUSCULAR | Status: AC
  Filled 2011-05-31: qty 4

## 2011-05-31 MED ORDER — DIPHENHYDRAMINE HCL 50 MG/ML IJ SOLN
INTRAMUSCULAR | Status: DC | PRN
  Administered 2011-05-31: 25 mg via INTRAVENOUS

## 2011-05-31 MED ORDER — METOPROLOL TARTRATE 50 MG PO TABS
50.0000 mg | ORAL_TABLET | Freq: Two times a day (BID) | ORAL | Status: DC
  Administered 2011-05-31 – 2011-06-01 (×3): 50 mg via ORAL
  Filled 2011-05-31 (×6): qty 1

## 2011-05-31 MED ORDER — MAGNESIUM SULFATE 50 % IJ SOLN
Freq: Once | INTRAVENOUS | Status: AC
  Administered 2011-05-31: 18:00:00 via INTRAVENOUS
  Filled 2011-05-31: qty 50

## 2011-05-31 MED ORDER — DIPHENHYDRAMINE HCL 50 MG/ML IJ SOLN
INTRAMUSCULAR | Status: AC
  Filled 2011-05-31: qty 1

## 2011-05-31 NOTE — Interval H&P Note (Signed)
History and Physical Interval Note:  05/31/2011 11:08 AM  Elizabeth Bush  has presented today for surgery, with the diagnosis of GI Bleeding  The various methods of treatment have been discussed with the patient and family. After consideration of risks, benefits and other options for treatment, the patient has consented to  Procedure(s): COLONOSCOPY as a surgical intervention .  The patients' history has been reviewed, patient examined, no change in status, stable for surgery.  I have reviewed the patients' chart and labs.  Questions were answered to the patient's satisfaction.     Elizabeth Bush  Risks (bleeding, infection, bowel perforation that could require surgery, sedation-related changes in cardiopulmonary systems), benefits (identification and possible treatment of source of symptoms, exclusion of certain causes of symptoms), and alternatives (watchful waiting, radiographic imaging studies, empiric medical treatment) of colonoscopy were explained to patient in detail and she wishes to proceed.

## 2011-05-31 NOTE — Op Note (Signed)
[facility] [address] [place], [place]  [address]  COLONOSCOPY PROCEDURE REPORT  PATIENT:  Elizabeth Bush, Elizabeth Bush  MR#:  [id number] BIRTHDATE:  1970-10-11, 40 yrs. old  GENDER:  female ENDOSCOPIST:  Willis Modena, MD REF. BY:  Marthann Schiller, MD PROCEDURE DATE:  05/31/2011 PROCEDURE:  Colonoscopy 19147 ASA CLASS:  Class II INDICATIONS:  blood in stool, microcytic anemia MEDICATIONS:   Fentanyl 100 mcg IV, Versed 10 mg IV, Benadryl 25 mg IV  DESCRIPTION OF PROCEDURE:   After the risks benefits and alternatives of the procedure were thoroughly explained, informed consent was obtained.  Digital rectal exam was performed and revealed moderate external hemorrhoids.   The Pentax Peds Colonoscope [id number] endoscope was introduced through the anus and advanced to the terminal ileum which was intubated for a short distance, without limitations.  The quality of the prep was fair.. The instrument was then slowly withdrawn as the colon was fully examined.  <<PROCEDUREIMAGES>> FINDINGS:  Digital rectal exam showed moderate external hemorrhoids, otherwise normal.  Prep quality was fair in proximal colon (ascending + cecum), good elsewhere.  Scattered medium-sized sigmoid diverticula.  Normal distal 5cm of the terminal ileum.  No polyps, masses, vascular ectasias, or inflammatory changes. Retroflexed view of rectum revealed inflammed internal hemorrhoids, otherwise normal.  No old or fresh blood seen to the extent of our examination.  ENDOSCOPIC IMPRESSION:      1.  Moderate internal and external hemorrhoids, likely source of blood in stool. 2.  Scattered sigmoid diverticula. 3.  Otherwise normal colonoscopy to terminal ileum.  RECOMMENDATIONS:        1.  Watch for potential complications of procedure. 2.  Advance diet. 3.  Anusol-HC for presumed hemorrhoidal bleeding. 4.  Capsule endoscopy  tomorrow (while the hemorrhoids could certainly explain some               bleeding,  they wouldn't readily explain her microcytic anemia.  REPEAT EXAM:  No  ______________________________ Willis Modena  CC:  n. eSIGNEDWillis Modena at 05/31/2011 11:56 AM  Williemae Area, [phone]

## 2011-05-31 NOTE — Progress Notes (Signed)
Subjective: Patient states she feels much better since she's had blood. She also reports that she has had a previous diagnosis of hypertension and had been on multiple medications. She decided to only take one medication and has essentially been noncompliant with that medication for greater than a year. The patient states that her dizziness is now resolved and she's had no further rectal bleeding. [name] I also had a long discussion about her alcohol usage and we discussed the risks to her both for GI prospective and involving of the pathology appeared The patient had an upper endoscopy which showed small antral erosions and mild bulbar duodenitis.. The endoscopy however did not demonstrate any ulcer or active bleeding. The colonoscopy on today show moderate internal and external hemorrhoids which is likely source of the blood in the stool. As scattered sigmoid diverticula but otherwise normal colonoscopy to terminal ileum. Objective: Filed Vitals:   05/31/11 1154 05/31/11 1204 05/31/11 1213 05/31/11 1402  BP: 153/98 160/86 162/105 156/106  Pulse:    100  Temp:    98 F (36.7 C)  TempSrc:    Oral  Resp: 22 19 14 18   Height:      Weight:      SpO2: 99% 99% 100% 100%   Weight change:   Intake/Output Summary (Last 24 hours) at 05/31/11 1706 Last data filed at 05/31/11 1300  Gross per 24 hour  Intake 1451.67 ml  Output   2400 ml  Net -948.33 ml    General: Alert, awake, oriented x3, in no acute distress.  HEENT: [place]/AT PEERL, EOMI Neck: Trachea midline,  no masses, no thyromegal,y no JVD, no carotid bruit OROPHARYNX:  Moist, No exudate/ erythema/lesions.  Heart: Regular rate and rhythm, without murmurs, rubs, gallops, PMI non-displaced, no heaves or thrills on palpation.  Lungs: Clear to auscultation, no wheezing or rhonchi noted. No increased vocal fremitus resonant to percussion  Abdomen: Soft, nontender, nondistended, positive bowel sounds, no masses no hepatosplenomegaly noted..    Neuro: No focal neurological deficits noted cranial nerves II through XII grossly intact. DTRs 2+ bilaterally upper and lower extremities. Strength 5 /5 in bilateral upper and lower extremities. Musculoskeletal: No warm swelling or erythema around joints, no spinal tenderness noted. Psychiatric: Patient alert and oriented x3, good insight and cognition, good recent to remote recall. Lymph node survey: No cervical axillary or inguinal lymphadenopathy noted.     Lab Results:  Basename 05/31/11 0415 05/29/11 1415 05/29/11 0905  NA 139 -- 135  K 2.9* -- 3.4*  CL 106 -- 100  CO2 24 -- 23  GLUCOSE 94 -- 123*  BUN <3* -- 4*  CREATININE 0.68 -- 0.65  CALCIUM 8.8 -- 9.4  MG -- 1.3* --  PHOS -- -- --    Basename 05/29/11 0905  AST 96*  ALT 54*  ALKPHOS 77  BILITOT 0.4  PROT 8.7*  ALBUMIN 3.4*   No results found for this basename: LIPASE:2,AMYLASE:2 in the last 72 hours  Basename 05/31/11 0415 05/30/11 0415  WBC 6.6 6.5  NEUTROABS -- 3.7  HGB 8.7* 8.9*  HCT 27.9* 27.9*  MCV 72.8* 72.3*  PLT 356 376   No results found for this basename: CKTOTAL:3,CKMB:3,CKMBINDEX:3,TROPONINI:3 in the last 72 hours No components found with this basename: POCBNP:3 No results found for this basename: DDIMER:2 in the last 72 hours No results found for this basename: HGBA1C:2 in the last 72 hours No results found for this basename: CHOL:2,HDL:2,LDLCALC:2,TRIG:2,CHOLHDL:2,LDLDIRECT:2 in the last 72 hours No results found for  this basename: TSH,T4TOTAL,FREET3,T3FREE,THYROIDAB in the last 72 hours No results found for this basename: VITAMINB12:2,FOLATE:2,FERRITIN:2,TIBC:2,IRON:2,RETICCTPCT:2 in the last 72 hours  Micro Results: Recent Results (from the past 240 hour(s))  WET PREP, GENITAL     Status: Abnormal   Collection Time   05/29/11  9:50 AM      Component Value Range Status Comment   Yeast, Wet Prep NONE SEEN  NONE SEEN  Final    Trich, Wet Prep NONE SEEN  NONE SEEN  Final    Clue Cells,  Wet Prep FEW (*) NONE SEEN  Final    WBC, Wet Prep HPF POC FEW (*) NONE SEEN  Final     Studies/Results: Ct Abdomen Pelvis W Contrast  05/29/2011  *RADIOLOGY REPORT*  Clinical Data: Abdominal pain.  CT ABDOMEN AND PELVIS WITH CONTRAST  Technique:  Multidetector CT imaging of the abdomen and pelvis was performed following the standard protocol during bolus administration of intravenous contrast.  Contrast: OMNIPAQUE IOHEXOL 300 MG/ML IV SOLN  Comparison: None.  Findings: Lung bases are clear.  No effusions.  Heart is normal size.  Liver, spleen, pancreas, gallbladder, adrenals and kidneys are unremarkable.  Small exophytic cyst off the superior pole of the left kidney which appears benign.  No hydronephrosis or stones.  No biliary ductal dilatation.  There are a few scattered descending colonic and sigmoid diverticula.  No active diverticulitis.  Uterus is enlarged and heterogeneous, likely with large fibroid in the fundus.  3.2 cm left ovarian cyst.  Right ovary unremarkable.  Prior appendectomy.  Small bowel is decompressed.  No free fluid, free air or adenopathy.  Aorta is normal caliber.  No acute bony abnormality.  IMPRESSION: No acute findings in the abdomen or pelvis.  Scattered left colonic diverticula.  Original Report Authenticated By: Cyndie Chime, M.D.    Medications: I have reviewed the patient's current medications. Scheduled Meds:   . bisacodyl  10 mg Oral Once  . folic acid  1 mg Oral Daily  . influenza  inactive virus vaccine  0.5 mL Intramuscular Tomorrow-1000  . LORazepam  0-4 mg Oral Q6H   Followed by  . LORazepam  0-4 mg Oral Q12H  . metoprolol tartrate  50 mg Oral BID  . metroNIDAZOLE  500 mg Oral Q12H  . mulitivitamin with minerals  1 tablet Oral Daily  . pantoprazole (PROTONIX) IV  40 mg Intravenous Q24H  . pneumococcal 23 valent vaccine  0.5 mL Intramuscular Tomorrow-1000  . potassium chloride  10 mEq Intravenous Q1 Hr x 6  . potassium chloride  40 mEq Oral  Daily  . thiamine  100 mg Oral Daily   Or  . thiamine  100 mg Intravenous Daily   Continuous Infusions:   . sodium chloride 100 mL/hr at 05/31/11 0842   PRN Meds:.hydrALAZINE, LORazepam, LORazepam, oxyCODONE, DISCONTD: diphenhydrAMINE, DISCONTD: fentaNYL, DISCONTD: midazolam Assessment/Plan: Patient Active Hospital Problem List: GIB (gastrointestinal bleeding) (05/29/2011)   Assessment: Patient has had no further active GI bleeding. She scheduled for a capsule endoscopy  tomorrow.presently her diet is been advanced and she's tolerating without difficulty. The patient has been put on proton pump inhibitors and Carafate.    Plan: To discharge and he may post a capsule endoscopy.  ETOH dependence (05/29/2011)   Assessment: So far the patient has shown no signs of withdrawal. The patient is in a contemplative state of cessation of alcohol use.   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 Dependent upon her blood pressures I will go ahead and add hydrochlorothiazide 12.5 mg if necessary.   Hypokalemia (05/31/2011)   Assessment: Currently being repleted by IV.magnesium was low at 1.3 on 05/29/11. I will go ahead and  supplement magnesium by IV on today and check it in the morning.      LOS: 2 days

## 2011-05-31 NOTE — Brief Op Note (Signed)
Please see [facility] note dated 05/31/11.

## 2011-06-01 ENCOUNTER — Encounter (HOSPITAL_COMMUNITY): Payer: Self-pay | Admitting: Gastroenterology

## 2011-06-01 ENCOUNTER — Encounter (HOSPITAL_COMMUNITY)
Admission: EM | Disposition: A | Discharge status: Home / Self Care | Payer: Self-pay | Source: Home / Self Care | Attending: Internal Medicine

## 2011-06-01 HISTORY — PX: GIVENS CAPSULE STUDY: SHX5432

## 2011-06-01 LAB — MAGNESIUM: Magnesium: 1.7 mg/dL (ref 1.5–2.5)

## 2011-06-01 LAB — BASIC METABOLIC PANEL
BUN: 4 mg/dL — ABNORMAL LOW (ref 6–23)
CO2: 24 mEq/L (ref 19–32)
Calcium: 8.8 mg/dL (ref 8.4–10.5)
Chloride: 105 mEq/L (ref 96–112)
Creatinine, Ser: 0.68 mg/dL (ref 0.50–1.10)
GFR calc Af Amer: >90 mL/min (ref >90)
GFR calc non Af Amer: >90 mL/min (ref >90)
Glucose, Bld: 105 mg/dL — ABNORMAL HIGH (ref 70–99)
Potassium: 3.7 mEq/L (ref 3.5–5.1)
Sodium: 138 mEq/L (ref 135–145)

## 2011-06-01 LAB — CBC
HCT: 27.5 % — ABNORMAL LOW (ref 36.0–46.0)
Hemoglobin: 8.5 g/dL — ABNORMAL LOW (ref 12.0–15.0)
MCH: 22.5 pg — ABNORMAL LOW (ref 26.0–34.0)
MCHC: 30.9 g/dL (ref 30.0–36.0)
MCV: 72.9 fL — ABNORMAL LOW (ref 78.0–100.0)
Platelets: 363 10*3/uL (ref 150–400)
RBC: 3.77 MIL/uL — ABNORMAL LOW (ref 3.87–5.11)
RDW: 19.9 % — ABNORMAL HIGH (ref 11.5–15.5)
WBC: 6.3 10*3/uL (ref 4.0–10.5)

## 2011-06-01 SURGERY — IMAGING PROCEDURE, GI TRACT, INTRALUMINAL, VIA CAPSULE

## 2011-06-01 MED ORDER — METRONIDAZOLE 500 MG PO TABS: 500.0000 mg | ORAL_TABLET | Freq: Two times a day (BID) | ORAL | Status: AC

## 2011-06-01 MED ORDER — POTASSIUM CHLORIDE CRYS ER 20 MEQ PO TBCR: 40.0000 meq | EXTENDED_RELEASE_TABLET | Freq: Every day | ORAL | Status: DC

## 2011-06-01 MED ORDER — PANTOPRAZOLE SODIUM 40 MG PO TBEC: 40.0000 mg | DELAYED_RELEASE_TABLET | Freq: Every day | ORAL | Status: DC

## 2011-06-01 MED ORDER — METRONIDAZOLE 500 MG PO TABS: 500.0000 mg | ORAL_TABLET | Freq: Two times a day (BID) | ORAL | Status: DC

## 2011-06-01 MED ORDER — FOLIC ACID 1 MG PO TABS: 1.0000 mg | ORAL_TABLET | Freq: Every day | ORAL | Status: DC

## 2011-06-01 MED ORDER — THIAMINE HCL 100 MG PO TABS: 100.0000 mg | ORAL_TABLET | Freq: Every day | ORAL | Status: DC

## 2011-06-01 MED ORDER — METOPROLOL TARTRATE 50 MG PO TABS: 50.0000 mg | ORAL_TABLET | Freq: Two times a day (BID) | ORAL | Status: DC

## 2011-06-01 MED ORDER — ADULT MULTIVITAMIN W/MINERALS CH: 1.0000 | ORAL_TABLET | Freq: Every day | ORAL | Status: DC

## 2011-06-01 MED ORDER — FOLIC ACID 1 MG PO TABS: 1.0000 mg | ORAL_TABLET | Freq: Every day | ORAL | Status: AC

## 2011-06-01 MED ORDER — THIAMINE HCL 100 MG PO TABS: 100.0000 mg | ORAL_TABLET | Freq: Every day | ORAL | Status: AC

## 2011-06-01 NOTE — Progress Notes (Signed)
The patient feels fine. She is in the process of the capsule endoscopy at this time. Once this is completed I think she can go home later today and she can follow up at [facility] as an outpatient. We will read the capsule endo.

## 2011-06-01 NOTE — Progress Notes (Addendum)
Pt  D/C home, denies pain, in stable condition. Discharge med/discharge  instruction education done. Pt verbalizes understanding.  Pt refuses Flu and PNA shot at this time.

## 2011-06-01 NOTE — Discharge Summary (Signed)
Elizabeth Bush MRN: [id number] DOB/AGE: December 12, 1970 41 y.o.  Admit date: 05/29/2011 Discharge date: 06/01/2011  Primary Care Physician:  No primary provider on file.   Discharge Diagnoses:   Patient Active Problem List  Diagnoses  . GIB (gastrointestinal bleeding)  . ETOH dependence  . Abdominal pain  . Nausea alone  . Anemia associated with acute blood loss  . Bacterial vaginosis  . HTN (hypertension), malignant  . Hypokalemia    DISCHARGE MEDICATION: Current Discharge Medication List    START taking these medications   Details  folic acid (FOLVITE) 1 MG tablet Take 1 tablet (1 mg total) by mouth daily. Qty: 30 tablet, Refills: 0    metoprolol (LOPRESSOR) 50 MG tablet Take 1 tablet (50 mg total) by mouth 2 (two) times daily. Qty: 60 tablet, Refills: 0    metroNIDAZOLE (FLAGYL) 500 MG tablet Take 1 tablet (500 mg total) by mouth every 12 (twelve) hours. Qty: 8 tablet, Refills: 0    Multiple Vitamin (MULITIVITAMIN WITH MINERALS) TABS Take 1 tablet by mouth daily.    pantoprazole (PROTONIX) 40 MG tablet Take 1 tablet (40 mg total) by mouth daily. Qty: 30 tablet, Refills: 0    potassium chloride SA (K-DUR,KLOR-CON) 20 MEQ tablet Take 2 tablets (40 mEq total) by mouth daily. Qty: 60 tablet, Refills: 0    thiamine 100 MG tablet Take 1 tablet (100 mg total) by mouth daily. Qty: 30 tablet, Refills: 0           Consults: Treatment Team:  Vertell Novak., MD   SIGNIFICANT DIAGNOSTIC STUDIES:  Ct Abdomen Pelvis W Contrast  05/29/2011  *RADIOLOGY REPORT*  Clinical Data: Abdominal pain.  CT ABDOMEN AND PELVIS WITH CONTRAST  Technique:  Multidetector CT imaging of the abdomen and pelvis was performed following the standard protocol during bolus administration of intravenous contrast.  Contrast: OMNIPAQUE IOHEXOL 300 MG/ML IV SOLN  Comparison: None.  Findings: Lung bases are clear.  No effusions.  Heart is normal size.  Liver, spleen, pancreas, gallbladder, adrenals  and kidneys are unremarkable.  Small exophytic cyst off the superior pole of the left kidney which appears benign.  No hydronephrosis or stones.  No biliary ductal dilatation.  There are a few scattered descending colonic and sigmoid diverticula.  No active diverticulitis.  Uterus is enlarged and heterogeneous, likely with large fibroid in the fundus.  3.2 cm left ovarian cyst.  Right ovary unremarkable.  Prior appendectomy.  Small bowel is decompressed.  No free fluid, free air or adenopathy.  Aorta is normal caliber.  No acute bony abnormality.  IMPRESSION: No acute findings in the abdomen or pelvis.  Scattered left colonic diverticula.  Original Report Authenticated By: Cyndie Chime, M.D.       Recent Results (from the past 240 hour(s))  WET PREP, GENITAL     Status: Abnormal   Collection Time   05/29/11  9:50 AM      Component Value Range Status Comment   Yeast, Wet Prep NONE SEEN  NONE SEEN  Final    Trich, Wet Prep NONE SEEN  NONE SEEN  Final    Clue Cells, Wet Prep FEW (*) NONE SEEN  Final    WBC, Wet Prep HPF POC FEW (*) NONE SEEN  Final   URINE CULTURE     Status: Normal (Preliminary result)   Collection Time   05/30/11  5:47 PM      Component Value Range Status Comment   Specimen Description URINE, CLEAN  CATCH   Final    Special Requests NONE   Final    Setup Time [id number]   Final    Colony Count PENDING   Incomplete    Culture Culture reincubated for better growth   Final    Report Status PENDING   Incomplete     BRIEF ADMITTING H & P: Elizabeth Bush is a pleasant 41 y/o female with H/O heavy ETOH use in the past 2 years. Pt states that while she had medical insurance 2 years ago she saw her Ob-Gyn Dr. Gaynell Face and at that time was having some occasional rectal bleeding. She was advised by her Ob-Gyn that she had hemorrhoids and so she did not worry with it. She states that the bleeding increased over time but she did not pursue any medical care as she no longer had medical  insurance coverage, and she thought that it was just her hemorrhoids. However in the last 2-3 months the volume of blood loss has increased significantly to the point that she stays in bed because when she sits, she bleeds through her underclothes. She also reports dizziness and intense fatigue that has kept her in the bed for the last several months. She also admits that she has been drinking about a 6-pack of beer daily and to excessive use of "Goody Powder". Last ETOH ingestion night PTA.    Hospital Course:  Present on Admission:  Patient Active Hospital Problem List: GIB (gastrointestinal bleeding) (05/29/2011) Assessment: Patient has had no further active GI bleeding.She had upper and lower endoscopy which showed antral gastritis and duodenitis, and internal and external hemorrhoids and scattered diverticulosis. She had a capsule endoscopy today and GI will follow up as an out patient. Presently her diet is been advanced and she's tolerating without difficulty. The patient has been put on proton pump inhibitors and Carafate.   ETOH dependence (05/29/2011) Assessment: So far the patient has shown no signs of withdrawal. The patient is in a contemplative state of cessation of alcohol use.   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 Dependent upon her blood pressures I will go ahead and add hydrochlorothiazide 12.5 mg if necessary.   Hypokalemia (05/31/2011) Assessment:repleted    Disposition and Follow-up:   Pt to F/U with [facility] for medical care and [facility] will call to F/U on biopsy and  further treatment.  Discharge Orders    Future Orders Please Complete By Expires   Diet - low sodium heart healthy            DISCHARGE EXAM: General: Alert, awake, oriented x3, in no acute distress.  HEENT: [place]/AT [place]ER[place], EOMI  Neck: Trachea midline, no masses, no thyromegal,y no JVD, no carotid bruit  OROPHARYNX: Moist, No exudate/ erythema/lesions.  Heart: Regular rate and rhythm, without murmurs, rubs, gallops, PMI non-displaced, no heaves or thrills on palpation.  Lungs: Clear to auscultation, no wheezing or rhonchi noted. No increased vocal fremitus resonant to percussion  Abdomen: Soft, nontender, nondistended, positive bowel sounds, no masses no hepatosplenomegaly noted..  Neuro: No focal neurological deficits noted cranial nerves II through XII grossly intact. DTRs 2+ bilaterally upper and lower extremities. Strength 5 /5  in bilateral upper and lower extremities.  Musculoskeletal: No warm swelling or erythema around joints, no spinal tenderness noted.  Psychiatric: Patient alert and oriented x3, good insight and cognition, good recent to remote recall.  Lymph node survey: No cervical axillary or inguinal lymphadenopathy noted.    Blood pressure 123/80, pulse 80, temperature 98.2 F (36.8 C), temperature source Oral, resp. rate 20, height 5\' 7"  (1.702 m), weight 97.3 kg (214 lb 8.1 oz), SpO2 100.00%.   Basename 06/01/11 0355 05/31/11 1645 05/31/11 0415  NA 138 -- 139  K 3.7 3.5 --  CL 105 -- 106  CO2 24 -- 24  GLUCOSE 105* -- 94  BUN 4* -- <3*  CREATININE 0.68 -- 0.68  CALCIUM 8.8 -- 8.8  MG 1.7 -- --  PHOS -- -- --   No results found for this basename: AST:2,ALT:2,ALKPHOS:2,BILITOT:2,PROT:2,ALBUMIN:2 in the last 72 hours No results found for this basename: LIPASE:2,AMYLASE:2 in the last 72 hours  Basename 06/01/11 0355 05/31/11 0415 05/30/11 0415  WBC 6.3 6.6 --  NEUTROABS -- -- 3.7  HGB 8.5* 8.7* --  HCT 27.5* 27.9* --  MCV 72.9* 72.8* --  PLT 363 356 --    Total time for D/C including face to face time approximately 40 minutes. Signed: MATTHEWS,MICHELLE A. 06/01/2011, 5:05 PM

## 2011-06-01 NOTE — Progress Notes (Signed)
GIVENS Capsule Endoscopy. Capsule given to pt at 0610. Procedure explained to pt. Pt tolerated capsule without difficulty and verbalized understanding. Macy Mis RN

## 2011-06-01 NOTE — Progress Notes (Signed)
CM consult completed by [facility] RN - [facility] apt Jul 14, 2011 at 10 am with Dr Sherron Flemings and eligibility apt Jun 16, 2011 at 11:30am; Patient has used [facility] in the past. Talked to patient about going to [facility] to get her prescriptions filled ( $4.00); Patient qualifies for the indigent funds (3 day medication supply);  B. Lobbyist, BSN MHA

## 2011-06-02 ENCOUNTER — Encounter (HOSPITAL_COMMUNITY): Payer: Self-pay | Admitting: Gastroenterology

## 2012-10-19 ENCOUNTER — Emergency Department (HOSPITAL_COMMUNITY)
Admission: EM | Admit: 2012-10-19 | Discharge: 2012-10-19 | Disposition: A | Discharge status: Home / Self Care | Payer: Self-pay | Attending: Emergency Medicine | Admitting: Emergency Medicine

## 2012-10-19 DIAGNOSIS — T465X5A Adverse effect of other antihypertensive drugs, initial encounter: Secondary | ICD-10-CM | POA: Insufficient documentation

## 2012-10-19 DIAGNOSIS — I10 Essential (primary) hypertension: Secondary | ICD-10-CM | POA: Insufficient documentation

## 2012-10-19 DIAGNOSIS — R220 Localized swelling, mass and lump, head: Secondary | ICD-10-CM | POA: Insufficient documentation

## 2012-10-19 DIAGNOSIS — T783XXA Angioneurotic edema, initial encounter: Secondary | ICD-10-CM | POA: Insufficient documentation

## 2012-10-19 DIAGNOSIS — F17200 Nicotine dependence, unspecified, uncomplicated: Secondary | ICD-10-CM | POA: Insufficient documentation

## 2012-10-19 MED ORDER — PREDNISONE 20 MG PO TABS
60.0000 mg | ORAL_TABLET | Freq: Once | ORAL | Status: AC
  Administered 2012-10-19: 60 mg via ORAL
  Filled 2012-10-19: qty 3

## 2012-10-19 MED ORDER — DIPHENHYDRAMINE HCL 25 MG PO CAPS
25.0000 mg | ORAL_CAPSULE | Freq: Once | ORAL | Status: AC
  Administered 2012-10-19: 25 mg via ORAL
  Filled 2012-10-19: qty 1

## 2012-10-19 MED ORDER — HYDROCHLOROTHIAZIDE 25 MG PO TABS: 25.0000 mg | ORAL_TABLET | Freq: Every day | ORAL | Status: DC

## 2012-10-19 NOTE — ED Notes (Signed)
Pt states that last night, she started experiencing some facial swelling/angioedema.  Pt asked if she takes any medications for HBP.  States that yesterday, her neighbor gave her one of her hypertension meds.  Denies any trouble breathing.

## 2012-10-19 NOTE — Progress Notes (Signed)
[facility] has seen patient and provided her with a oc application. °

## 2012-10-19 NOTE — ED Notes (Signed)
Pt also c/o bilateral leg pain x 2 years.

## 2012-10-19 NOTE — ED Provider Notes (Signed)
History     CSN: [id number]  Arrival date & time 10/19/12  1035   First MD Initiated Contact with Patient 10/19/12 1043      Chief Complaint  Patient presents with  . Facial Swelling  . Angioedema     The history is provided by the patient.   Patient reports a history of hypertension she is currently out of her hydrochlorothiazide.  Last night she "felt like my blood pressure was up" and therefore she took one of her neighbors blood pressure medications.  Shortly after she developed swelling of both her upper and lower lips.  This began yesterday using it was severe.  She reports her swelling has significantly improved at this time.  No difficulty breathing or swallowing.  No other complaints.  She does not have a primary care physician.  She states she is post be on hydrochlorothiazide 25 mg once a day.   Past Medical History  Diagnosis Date  . Hypertension     Past Surgical History  Procedure Laterality Date  . Tubal ligation    . Tonsillectomy    . Esophagogastroduodenoscopy  05/30/2011    Procedure: ESOPHAGOGASTRODUODENOSCOPY (EGD);  Surgeon: [name], MD;  Location: [facility];  Service: Endoscopy;  Laterality: N/A;  . Colonoscopy  05/31/2011    Procedure: COLONOSCOPY;  Surgeon: [name], MD;  Location: [facility];  Service: Endoscopy;  Laterality: N/A;  . Givens capsule study  06/01/2011    Procedure: GIVENS CAPSULE STUDY;  Surgeon: [name], MD;  Location: [facility];  Service: Endoscopy;  Laterality: N/A;    Family History  Problem Relation Age of Onset  . Diabetes Mother     History  Substance Use Topics  . Smoking status: Current Some Day Smoker -- 0.25 packs/day for 5 years  . Smokeless tobacco: Never Used  . Alcohol Use: 3.6 oz/week    6 Cans of beer per week     Comment: 6 pack per day of beer    OB History   Grav Para Term Preterm Abortions TAB SAB Ect Mult Living                  Review of Systems  All other systems  reviewed and are negative.    Allergies  Lisinopril  Home Medications   Current Outpatient Rx  Name  Route  Sig  Dispense  Refill  . hydrochlorothiazide (HYDRODIURIL) 25 MG tablet   Oral   Take 1 tablet (25 mg total) by mouth daily.   30 tablet   2     BP 145/86  Pulse 110  Temp(Src) 98.2 F (36.8 C) (Oral)  Resp 18  SpO2 97%  LMP 10/12/2012  Physical Exam  Nursing note and vitals reviewed. Constitutional: She is oriented to person, place, and time. She appears well-developed and well-nourished. No distress.  HENT:  Head: Normocephalic and atraumatic.  Evidence of angioedema of both upper and lower lips.  Oral pharynx is patent.  Tolerating secretions.  Airway patent.  No tongue swelling.  Eyes: EOM are normal.  Neck: Normal range of motion.  Cardiovascular: Normal rate, regular rhythm and normal heart sounds.   Pulmonary/Chest: Effort normal and breath sounds normal.  Abdominal: Soft. She exhibits no distension. There is no tenderness.  Musculoskeletal: Normal range of motion.  Neurological: She is alert and oriented to person, place, and time.  Skin: Skin is warm and dry.  Psychiatric: She has a normal mood and affect. Judgment normal.  ED Course  Procedures (including critical care time)  Labs Reviewed - No data to display No results found.   1. Angioedema, initial encounter       MDM  She's had angioedema of her lips since last night.  She reports that her lip swelling is significantly improved.  Single dose of prednisone Benadryl emergency department.  This is likely secondary to lisinopril given to her by her neighbor.  Her hydrochlorothiazide was written for.  She[facility] for primary care followup.  Blood pressure 146/86 in the emergency department.  Discharge as she has had already her observational period        Lyanne Co, MD 10/19/12 1147

## 2013-09-04 ENCOUNTER — Encounter (HOSPITAL_COMMUNITY): Payer: Self-pay | Admitting: Emergency Medicine

## 2013-09-04 ENCOUNTER — Emergency Department (HOSPITAL_COMMUNITY)
Admission: EM | Admit: 2013-09-04 | Discharge: 2013-09-04 | Disposition: A | Discharge status: Home / Self Care | Payer: Self-pay | Attending: Emergency Medicine | Admitting: Emergency Medicine

## 2013-09-04 DIAGNOSIS — F17200 Nicotine dependence, unspecified, uncomplicated: Secondary | ICD-10-CM | POA: Insufficient documentation

## 2013-09-04 DIAGNOSIS — L03211 Cellulitis of face: Secondary | ICD-10-CM

## 2013-09-04 DIAGNOSIS — X58XXXA Exposure to other specified factors, initial encounter: Secondary | ICD-10-CM | POA: Insufficient documentation

## 2013-09-04 DIAGNOSIS — K130 Diseases of lips: Secondary | ICD-10-CM

## 2013-09-04 DIAGNOSIS — L0201 Cutaneous abscess of face: Secondary | ICD-10-CM | POA: Insufficient documentation

## 2013-09-04 DIAGNOSIS — S01541A Puncture wound with foreign body of lip, initial encounter: Secondary | ICD-10-CM

## 2013-09-04 DIAGNOSIS — I10 Essential (primary) hypertension: Secondary | ICD-10-CM | POA: Insufficient documentation

## 2013-09-04 DIAGNOSIS — Y9389 Activity, other specified: Secondary | ICD-10-CM | POA: Insufficient documentation

## 2013-09-04 DIAGNOSIS — Z79899 Other long term (current) drug therapy: Secondary | ICD-10-CM | POA: Insufficient documentation

## 2013-09-04 DIAGNOSIS — S01501A Unspecified open wound of lip, initial encounter: Secondary | ICD-10-CM | POA: Insufficient documentation

## 2013-09-04 DIAGNOSIS — Z23 Encounter for immunization: Secondary | ICD-10-CM | POA: Insufficient documentation

## 2013-09-04 DIAGNOSIS — Y929 Unspecified place or not applicable: Secondary | ICD-10-CM | POA: Insufficient documentation

## 2013-09-04 MED ORDER — CLINDAMYCIN HCL 150 MG PO CAPS
300.0000 mg | ORAL_CAPSULE | Freq: Three times a day (TID) | ORAL | Status: DC
Start: 2013-09-04 — End: 2016-11-05

## 2013-09-04 MED ORDER — TETANUS-DIPHTH-ACELL PERTUSSIS 5-2.5-18.5 LF-MCG/0.5 IM SUSP
0.5000 mL | Freq: Once | INTRAMUSCULAR | Status: AC
  Administered 2013-09-04: 0.5 mL via INTRAMUSCULAR
  Filled 2013-09-04: qty 0.5

## 2013-09-04 MED ORDER — LIDOCAINE-EPINEPHRINE-TETRACAINE (LET) SOLUTION
3.0000 mL | Freq: Once | NASAL | Status: AC
  Administered 2013-09-04: 3 mL via TOPICAL
  Filled 2013-09-04: qty 3

## 2013-09-04 NOTE — ED Notes (Signed)
Pt states she got her lip peirced on Friday night  Pt states Saturday early morning she noticed swelling to her lip and face  Pt states she continues to have swelling to her lips  Pt states she has been taking penicillin for it and the swelling to her face has improved but her lips remain swollen

## 2013-09-04 NOTE — ED Notes (Signed)
EDPA at bedside for incision and removal of lip piercing, pt tolerated procedure well.

## 2013-09-04 NOTE — Discharge Instructions (Signed)
Disposition spit with warm water to prevent particles from seeding in your wound. Take the antibiotics as prescribed. Take ibuprofen as needed for pain control. Return if symptoms worsen.  Facial Infection You have an infection of your face. This requires special attention to help prevent serious problems. Infections in facial wounds can cause poor healing and scars. They can also spread to deeper tissues, especially around the eye. Wound and dental infections can lead to sinusitis, infection of the eye socket, and even meningitis. Permanent damage to the skin, eye, and nervous system may result if facial infections are not treated properly. With severe infections, hospital care for IV antibiotic injections may be needed if they don't respond to oral antibiotics. Antibiotics must be taken for the full course to insure the infection is eliminated. If the infection came from a bad tooth, it may have to be extracted when the infection is under control. Warm compresses may be applied to reduce skin irritation and remove drainage. You might need a tetanus shot now if:  You cannot remember when your last tetanus shot was.  You have never had a tetanus shot.  The object that caused your wound was dirty. If you need a tetanus shot, and you decide not to get one, there is a rare chance of getting tetanus. Sickness from tetanus can be serious. If you got a tetanus shot, your arm may swell, get red and warm to the touch at the shot site. This is common and not a problem. SEEK IMMEDIATE MEDICAL CARE IF:   You have increased swelling, redness, or trouble breathing.  You have a severe headache, dizziness, nausea, or vomiting.  You develop problems with your eyesight.  You have a fever. Document Released: 06/11/2004 Document Revised: 07/27/2011 Document Reviewed: 05/04/2005 [facility] Patient Information 2014 [place].

## 2013-09-04 NOTE — ED Provider Notes (Signed)
CSN: [id number]     Arrival date & time 09/04/13  1754 History   None    This chart was scribed for non-physician practitioner, Antonietta Breach, PA-C working with Saddie Benders. Dorna Mai, MD by Forrestine Him, ED Scribe. This patient was seen in room WTR5/WTR5 and the patient's care was started at 9:01 PM.   Chief Complaint  Patient presents with  . Oral Swelling   The history is provided by the patient. No language interpreter was used.    HPI Comments: Elizabeth Bush is a 43 y.o. female with a PMHx of HTN who presents to the Emergency Department complaining of ongoing, moderate oral and facial swelling x 2 days that is somewhat responsive to Penicillin. She has also noted pain described as throbbing to her lip and yellow colored pus drainage. Pt states she recently had her lip pierced 3 days ago. States she has been taking prescribed Penicillin from a [place] store and has noted facial swelling improvement; No improvement for lip swelling. She states she was given 4 tabs of the Penicillin and has 1 tab remaining. At this time she denies any fever or chills. She has no other pertinent medical history. No other concerns this visit.  Past Medical History  Diagnosis Date  . Hypertension    Past Surgical History  Procedure Laterality Date  . Tubal ligation    . Tonsillectomy    . Esophagogastroduodenoscopy  05/30/2011    Procedure: ESOPHAGOGASTRODUODENOSCOPY (EGD);  Surgeon: Landry Dyke, MD;  Location: [facility];  Service: Endoscopy;  Laterality: N/A;  . Colonoscopy  05/31/2011    Procedure: COLONOSCOPY;  Surgeon: Landry Dyke, MD;  Location: [facility];  Service: Endoscopy;  Laterality: N/A;  . Givens capsule study  06/01/2011    Procedure: GIVENS CAPSULE STUDY;  Surgeon: Landry Dyke, MD;  Location: [facility];  Service: Endoscopy;  Laterality: N/A;   Family History  Problem Relation Age of Onset  . Diabetes Mother   . Hypertension Other   . Asthma Other    History  Substance Use  Topics  . Smoking status: Current Some Day Smoker -- 0.25 packs/day for 5 years    Types: Cigarettes  . Smokeless tobacco: Never Used  . Alcohol Use: 3.6 oz/week    6 Cans of beer per week     Comment: 6 pack per day of beer   OB History   Grav Para Term Preterm Abortions TAB SAB Ect Mult Living                  Review of Systems  Constitutional: Negative for fever and chills.  HENT: Positive for facial swelling (Facial and Oral swelling). Negative for congestion.   Eyes: Negative for redness.  Respiratory: Negative for cough.   Skin: Negative for rash.  Psychiatric/Behavioral: Negative for confusion.     Allergies  Lisinopril  Home Medications   Prior to Admission medications   Medication Sig Start Date End Date Taking? Authorizing Provider  hydrochlorothiazide (HYDRODIURIL) 25 MG tablet Take 1 tablet (25 mg total) by mouth daily. 10/19/12   Hoy Morn, MD   Triage Vitals: BP 145/89  Pulse 108  Temp(Src) 97.8 F (36.6 C) (Oral)  Resp 20  SpO2 100%   Physical Exam  Nursing note and vitals reviewed. Constitutional: She is oriented to person, place, and time. She appears well-developed and well-nourished. No distress.  HENT:  Head: Normocephalic and atraumatic.  Right Ear: External ear normal.  Left Ear: External ear  normal.  Nose: Nose normal.  Mouth/Throat: Uvula is midline, oropharynx is clear and moist and mucous membranes are normal.  Oropharynx clear. Patient tolerating secretions without difficulty. Patient has purulent drainage from her piercing site to her left lower lip as well as swelling to her left lower lip. No facial swelling or red linear streaking appreciated.  Eyes: Conjunctivae and EOM are normal. No scleral icterus.  Neck: Normal range of motion.  Cardiovascular: Normal rate.   Pulmonary/Chest: Effort normal. No respiratory distress.  Musculoskeletal: Normal range of motion.  Neurological: She is alert and oriented to person, place, and time.   Skin: Skin is warm and dry. No rash noted. She is not diaphoretic. No erythema. No pallor.  Psychiatric: She has a normal mood and affect. Her behavior is normal.    ED Course  FOREIGN BODY REMOVAL Date/Time: 09/04/2013 10:51 PM Performed by: Antonietta Breach Authorized by: Antonietta Breach Consent: Verbal consent obtained. written consent not obtained. The procedure was performed in an emergent situation. Risks and benefits: risks, benefits and alternatives were discussed Consent given by: patient Patient understanding: patient states understanding of the procedure being performed Patient consent: the patient's understanding of the procedure matches consent given Procedure consent: procedure consent matches procedure scheduled Relevant documents: relevant documents present and verified Test results: test results available and properly labeled Site marked: the operative site was marked Imaging studies: imaging studies available Required items: required blood products, implants, devices, and special equipment available Patient identity confirmed: verbally with patient and arm band Time out: Immediately prior to procedure a "time out" was called to verify the correct patient, procedure, equipment, support staff and site/side marked as required. Intake: L lower lip. Anesthesia: local infiltration Local anesthetic: LET (lido,epi,tetracaine) Anesthetic total: 5 ml Patient sedated: no Patient restrained: no Patient cooperative: yes Complexity: simple 1 objects recovered. Objects recovered: lip ring piercing Post-procedure assessment: foreign body removed Patient tolerance: Patient tolerated the procedure well with no immediate complications.   (including critical care time)  DIAGNOSTIC STUDIES: Oxygen Saturation is 100% on RA, Normal by my interpretation.    COORDINATION OF CARE: 10:47 PM-Discussed treatment plan with pt at bedside and pt agreed to plan.     Labs Review Labs Reviewed -  No data to display  Imaging Review No results found.   EKG Interpretation None      MDM   Final diagnoses:  Puncture wound of lip with foreign body  Cellulitis, lip  Abscess of lip    Patient with signs of acute infection in abscess to her left lower lip after having it pierced on Friday night. Ring removed been ED. Patient well and nontoxic appearing, hemodynamically stable, and afebrile. She is appropriate for discharge with course of clindamycin. Maxillofacial referral provided should symptoms persist. Return precautions discussed and provided. Patient agreeable to plan with no unaddressed concerns.  I personally performed the services described in this documentation, which was scribed in my presence. The recorded information has been reviewed and is accurate.    Filed Vitals:   09/04/13 1915 09/04/13 2246  BP: 145/89 155/79  Pulse: 108 97  Temp: 97.8 F (36.6 C)   TempSrc: Oral   Resp: 20 16  SpO2: 100% 99%     Antonietta Breach, PA-C 09/04/13 2253

## 2013-09-05 NOTE — ED Provider Notes (Signed)
Medical screening examination/treatment/procedure(s) were performed by non-physician practitioner and as supervising physician I was immediately available for consultation/collaboration.   EKG Interpretation None        Saddie Benders. Breckon Reeves, MD 09/05/13 2314

## 2013-09-21 ENCOUNTER — Encounter (HOSPITAL_COMMUNITY): Payer: Self-pay | Admitting: *Deleted

## 2013-09-21 ENCOUNTER — Observation Stay (HOSPITAL_COMMUNITY)
Admission: AD | Admit: 2013-09-21 | Discharge: 2013-09-21 | Disposition: A | Discharge status: Home / Self Care | Payer: Self-pay | Source: Ambulatory Visit | Attending: Obstetrics and Gynecology | Admitting: Obstetrics and Gynecology

## 2013-09-21 ENCOUNTER — Inpatient Hospital Stay (HOSPITAL_COMMUNITY): Payer: Self-pay

## 2013-09-21 DIAGNOSIS — D62 Acute posthemorrhagic anemia: Secondary | ICD-10-CM

## 2013-09-21 DIAGNOSIS — I10 Essential (primary) hypertension: Secondary | ICD-10-CM | POA: Insufficient documentation

## 2013-09-21 DIAGNOSIS — F10239 Alcohol dependence with withdrawal, unspecified: Secondary | ICD-10-CM | POA: Insufficient documentation

## 2013-09-21 DIAGNOSIS — F1020 Alcohol dependence, uncomplicated: Secondary | ICD-10-CM | POA: Insufficient documentation

## 2013-09-21 DIAGNOSIS — R0609 Other forms of dyspnea: Secondary | ICD-10-CM | POA: Insufficient documentation

## 2013-09-21 DIAGNOSIS — N938 Other specified abnormal uterine and vaginal bleeding: Principal | ICD-10-CM | POA: Diagnosis present

## 2013-09-21 DIAGNOSIS — R0989 Other specified symptoms and signs involving the circulatory and respiratory systems: Secondary | ICD-10-CM | POA: Insufficient documentation

## 2013-09-21 DIAGNOSIS — N949 Unspecified condition associated with female genital organs and menstrual cycle: Principal | ICD-10-CM | POA: Insufficient documentation

## 2013-09-21 DIAGNOSIS — R7401 Elevation of levels of liver transaminase levels: Secondary | ICD-10-CM | POA: Insufficient documentation

## 2013-09-21 DIAGNOSIS — N760 Acute vaginitis: Secondary | ICD-10-CM | POA: Insufficient documentation

## 2013-09-21 DIAGNOSIS — D500 Iron deficiency anemia secondary to blood loss (chronic): Secondary | ICD-10-CM

## 2013-09-21 DIAGNOSIS — F10939 Alcohol use, unspecified with withdrawal, unspecified: Secondary | ICD-10-CM | POA: Insufficient documentation

## 2013-09-21 DIAGNOSIS — R7402 Elevation of levels of lactic acid dehydrogenase (LDH): Secondary | ICD-10-CM | POA: Insufficient documentation

## 2013-09-21 DIAGNOSIS — D649 Anemia, unspecified: Secondary | ICD-10-CM | POA: Insufficient documentation

## 2013-09-21 DIAGNOSIS — A499 Bacterial infection, unspecified: Secondary | ICD-10-CM | POA: Insufficient documentation

## 2013-09-21 DIAGNOSIS — B9689 Other specified bacterial agents as the cause of diseases classified elsewhere: Secondary | ICD-10-CM | POA: Insufficient documentation

## 2013-09-21 DIAGNOSIS — D250 Submucous leiomyoma of uterus: Secondary | ICD-10-CM | POA: Insufficient documentation

## 2013-09-21 DIAGNOSIS — N925 Other specified irregular menstruation: Principal | ICD-10-CM

## 2013-09-21 DIAGNOSIS — R740 Nonspecific elevation of levels of transaminase and lactic acid dehydrogenase [LDH]: Secondary | ICD-10-CM

## 2013-09-21 HISTORY — DX: Chlamydial infection, unspecified: A74.9

## 2013-09-21 HISTORY — DX: Gonococcal infection, unspecified: A54.9

## 2013-09-21 LAB — COMPREHENSIVE METABOLIC PANEL
ALT: 49 U/L — ABNORMAL HIGH (ref 0–35)
AST: 118 U/L — ABNORMAL HIGH (ref 0–37)
Albumin: 3.4 g/dL — ABNORMAL LOW (ref 3.5–5.2)
Alkaline Phosphatase: 90 U/L (ref 39–117)
BUN: 7 mg/dL (ref 6–23)
CO2: 20 mEq/L (ref 19–32)
Calcium: 9.2 mg/dL (ref 8.4–10.5)
Chloride: 105 mEq/L (ref 96–112)
Creatinine, Ser: 0.61 mg/dL (ref 0.50–1.10)
GFR calc Af Amer: >90 mL/min (ref >90)
GFR calc non Af Amer: >90 mL/min (ref >90)
Glucose, Bld: 84 mg/dL (ref 70–99)
Potassium: 4.2 mEq/L (ref 3.7–5.3)
Sodium: 142 mEq/L (ref 137–147)
Total Bilirubin: 0.4 mg/dL (ref 0.3–1.2)
Total Protein: 8.3 g/dL (ref 6.0–8.3)

## 2013-09-21 LAB — CBC
HCT: 21.5 % — ABNORMAL LOW (ref 36.0–46.0)
Hemoglobin: 6.2 g/dL — CL (ref 12.0–15.0)
MCH: 20 pg — ABNORMAL LOW (ref 26.0–34.0)
MCHC: 28.8 g/dL — ABNORMAL LOW (ref 30.0–36.0)
MCV: 69.4 fL — ABNORMAL LOW (ref 78.0–100.0)
Platelets: 368 10*3/uL (ref 150–400)
RBC: 3.1 MIL/uL — ABNORMAL LOW (ref 3.87–5.11)
RDW: 18.8 % — ABNORMAL HIGH (ref 11.5–15.5)
WBC: 5.9 10*3/uL (ref 4.0–10.5)

## 2013-09-21 LAB — ETHANOL: Alcohol, Ethyl (B): <11 mg/dL (ref 0–11)

## 2013-09-21 LAB — PREPARE RBC (CROSSMATCH)

## 2013-09-21 LAB — HEMOGLOBIN AND HEMATOCRIT, BLOOD
HCT: 25.3 % — ABNORMAL LOW (ref 36.0–46.0)
Hemoglobin: 7.9 g/dL — ABNORMAL LOW (ref 12.0–15.0)

## 2013-09-21 LAB — WET PREP, GENITAL: Trich, Wet Prep: NONE SEEN

## 2013-09-21 LAB — HCG, QUANTITATIVE, PREGNANCY: hCG, Beta Chain, Quant, S: <1 m[IU]/mL (ref <5)

## 2013-09-21 LAB — ABO/RH: ABO/RH(D): B POS

## 2013-09-21 MED ORDER — HYDROCHLOROTHIAZIDE 25 MG PO TABS
25.0000 mg | ORAL_TABLET | Freq: Every day | ORAL | Status: DC
  Administered 2013-09-21: 25 mg via ORAL
  Filled 2013-09-21 (×2): qty 1

## 2013-09-21 MED ORDER — FERROUS SULFATE 325 (65 FE) MG PO TABS: 325.0000 mg | ORAL_TABLET | Freq: Two times a day (BID) | ORAL | Status: DC

## 2013-09-21 MED ORDER — LORAZEPAM 1 MG PO TABS
0.0000 mg | ORAL_TABLET | Freq: Four times a day (QID) | ORAL | Status: DC
  Administered 2013-09-21: 1 mg via ORAL
  Administered 2013-09-21: 2 mg via ORAL
  Filled 2013-09-21: qty 1
  Filled 2013-09-21: qty 2

## 2013-09-21 MED ORDER — LORAZEPAM 1 MG PO TABS: 1.0000 mg | ORAL_TABLET | Freq: Four times a day (QID) | ORAL | Status: DC | PRN

## 2013-09-21 MED ORDER — LORAZEPAM 2 MG/ML IJ SOLN: 1.0000 mg | Freq: Four times a day (QID) | INTRAMUSCULAR | Status: DC | PRN

## 2013-09-21 MED ORDER — LABETALOL HCL 5 MG/ML IV SOLN
20.0000 mg | INTRAVENOUS | Status: DC | PRN
  Administered 2013-09-21 (×2): 20 mg via INTRAVENOUS
  Filled 2013-09-21 (×2): qty 4

## 2013-09-21 MED ORDER — THIAMINE HCL 100 MG/ML IJ SOLN
100.0000 mg | Freq: Every day | INTRAMUSCULAR | Status: DC
  Filled 2013-09-21 (×2): qty 1

## 2013-09-21 MED ORDER — SODIUM CHLORIDE 0.9 % IV SOLN
INTRAVENOUS | Status: DC
  Administered 2013-09-21: 12:00:00 via INTRAVENOUS

## 2013-09-21 MED ORDER — LORAZEPAM 1 MG PO TABS: 0.0000 mg | ORAL_TABLET | Freq: Two times a day (BID) | ORAL | Status: DC

## 2013-09-21 MED ORDER — IBUPROFEN 600 MG PO TABS
600.0000 mg | ORAL_TABLET | Freq: Four times a day (QID) | ORAL | Status: DC | PRN
  Administered 2013-09-21: 600 mg via ORAL
  Filled 2013-09-21: qty 1

## 2013-09-21 MED ORDER — VITAMIN B-1 100 MG PO TABS
100.0000 mg | ORAL_TABLET | Freq: Every day | ORAL | Status: DC
  Administered 2013-09-21: 100 mg via ORAL
  Filled 2013-09-21 (×2): qty 1

## 2013-09-21 MED ORDER — MEGESTROL ACETATE 40 MG PO TABS
40.0000 mg | ORAL_TABLET | Freq: Two times a day (BID) | ORAL | Status: DC
  Administered 2013-09-21 (×2): 40 mg via ORAL
  Filled 2013-09-21 (×3): qty 1

## 2013-09-21 MED ORDER — PRENATAL MULTIVITAMIN CH: 1.0000 | ORAL_TABLET | Freq: Every day | ORAL | Status: DC

## 2013-09-21 MED ORDER — ADULT MULTIVITAMIN W/MINERALS CH
1.0000 | ORAL_TABLET | Freq: Every day | ORAL | Status: DC
  Administered 2013-09-21: 1 via ORAL
  Filled 2013-09-21 (×2): qty 1

## 2013-09-21 MED ORDER — HYDROCHLOROTHIAZIDE 25 MG PO TABS: 25.0000 mg | ORAL_TABLET | Freq: Every day | ORAL | Status: DC

## 2013-09-21 MED ORDER — FOLIC ACID 1 MG PO TABS
1.0000 mg | ORAL_TABLET | Freq: Every day | ORAL | Status: DC
  Administered 2013-09-21: 1 mg via ORAL
  Filled 2013-09-21 (×2): qty 1

## 2013-09-21 MED ORDER — MEGESTROL ACETATE 40 MG PO TABS: 40.0000 mg | ORAL_TABLET | Freq: Two times a day (BID) | ORAL | Status: DC

## 2013-09-21 MED ORDER — ALUM & MAG HYDROXIDE-SIMETH 200-200-20 MG/5ML PO SUSP: 30.0000 mL | ORAL | Status: DC | PRN

## 2013-09-21 NOTE — H&P (Signed)
Chief Complaint:  Vaginal Bleeding  @MAUPATCONTACT @  HPI: Elizabeth Bush is a 43 y.o. [phone] with pmh of chronic GI bleed, alcohol abuse, and BTL who presents to maternity admissions reporting vaginal bleeding x 2 months associated with large clots two days ago. Pt notes vaginal bleeding that started two months ago which she thought was simply a heavy period. Monday 09/18/13 pt complained of new lower abdominal cramping. Tuesday 09/19/13 pt went to the bathroom and passed a very large dark red/brown clot. Since Tuesday the pt has continue to have lower abdominal cramping as well as well as the passage of several small blood clots. Pt is sexually active with last intercourse yesterday. Pt acknowledges dysparaeunia, vomiting x 2 weeks, fatigue, malaise, exertional dyspnea, depressed appetite.   Pt is currently drinking six 40 oz beers per day   Pregnancy Course:   Past Medical History: Past Medical History  Diagnosis Date  . Hypertension   . Gonorrhea   . Chlamydia     Past obstetric history: OB History  Gravida Para Term Preterm AB SAB TAB Ectopic Multiple Living  4 3 3  1  1   3     # Outcome Date GA Lbr Len/2nd Weight Sex Delivery Anes PTL Lv  4 TRM           3 TRM           2 TAB           1 TRM               Past Surgical History: Past Surgical History  Procedure Laterality Date  . Tubal ligation    . Tonsillectomy    . Esophagogastroduodenoscopy  05/30/2011    Procedure: ESOPHAGOGASTRODUODENOSCOPY (EGD);  Surgeon: Landry Dyke, MD;  Location: [facility];  Service: Endoscopy;  Laterality: N/A;  . Colonoscopy  05/31/2011    Procedure: COLONOSCOPY;  Surgeon: Landry Dyke, MD;  Location: [facility];  Service: Endoscopy;  Laterality: N/A;  . Givens capsule study  06/01/2011    Procedure: GIVENS CAPSULE STUDY;  Surgeon: Landry Dyke, MD;  Location: [facility];  Service: Endoscopy;  Laterality: N/A;    Family History: Family History  Problem Relation Age of  Onset  . Diabetes Mother   . Hypertension Other   . Asthma Other     Social History: History  Substance Use Topics  . Smoking status: Current Some Day Smoker -- 0.25 packs/day for 5 years    Types: Cigarettes  . Smokeless tobacco: Never Used  . Alcohol Use: 3.6 oz/week    6 Cans of beer per week     Comment: 6 40 oz beers/day    Allergies:  Allergies  Allergen Reactions  . Lisinopril Swelling    Swelling of mouth/lips    Meds:  Prescriptions prior to admission  Medication Sig Dispense Refill  . clindamycin (CLEOCIN) 150 MG capsule Take 2 capsules (300 mg total) by mouth 3 (three) times daily. May dispense as 150mg  capsules  60 capsule  0  . Naproxen Sod-Diphenhydramine (ALEVE PM) 220-25 MG TABS Take 1 tablet by mouth daily as needed (pain).         ROS: Pertinent findings in history of present illness.  Physical Exam  Blood pressure 154/98, pulse 97, temperature 98 F (36.7 C), temperature source Oral, resp. rate 18. GENERAL: Well-developed, well-nourished female in no acute distress.  HEENT: normocephalic, scleral icterus, peripheral pulses 2+, cap refill within normal limits HEART: normal rate RESP:  normal effort ABDOMEN: Soft, bowel sounds x4, RUQ and epigastric tenderness to palpation, lower abdominal and suprapubic tenderness to palpation bilaterally, EXTREMITIES: Nontender, no edema NEURO: alert and oriented Speculum exam: Vagina - odorous, small amount of bleeding and clots noted Cervix - No contact bleeding Bimanual exam: Cervix closed, no CMT,  Uterus non tender, large Adnexa non tender, no masses bilaterally GC/Chlam, wet prep done Chaperone present for exam.       Labs: Results for orders placed during the hospital encounter of 09/21/13 (from the past 24 hour(s))  HCG, QUANTITATIVE, PREGNANCY     Status: None   Collection Time    09/21/13  9:30 AM      Result Value Ref Range   hCG, Beta Chain, Quant, S <1  <5 mIU/mL    Imaging:  No results  found. MAU Course: CBC Pregnancy test-negative Beta quant Wet prep G/C Ultrasound 2 units PRB BMP Type and screen  Assessment/Plan  Dysfunctional Uterine Bleeding / Symptomatic Anemia 1. Admit for Observation 2. Ultrasound to visualize uterus/pmh of large fibroid 2. 2 units of PRBc for associated anemia with goal of >/= 8  Bacterial Vaginosis 1. Flagyl 500 mgs BID for 7 days  Abdominal cramping/back pain 1. Ibuprofen 600mg  q4-6 hrs prn for pain  Needs to follow up with a primary care provider       Medication List    ASK your doctor about these medications       ALEVE PM 220-25 MG Tabs  Generic drug:  Naproxen Sod-Diphenhydramine  Take 1 tablet by mouth daily as needed (pain).     clindamycin 150 MG capsule  Commonly known as:  CLEOCIN  Take 2 capsules (300 mg total) by mouth 3 (three) times daily. May dispense as 150mg  capsules        Melvern Banker, [place] 09/21/2013 10:38 AM   Patient seen and examined by me also Discussed with Dr Elly Modena + Orthostatic VS Has severe anemia and elevated transaminases [place] pending Will transfuse 2 units of blood. Will start on Megace for bleeding and give Flagyl for BV  Seabron Spates, CNM  Results for orders placed during the hospital encounter of 09/21/13 (from the past 24 hour(s))  HCG, QUANTITATIVE, PREGNANCY     Status: None   Collection Time    09/21/13  9:30 AM      Result Value Ref Range   hCG, Beta Chain, Quant, S <1  <5 mIU/mL  CBC     Status: Abnormal   Collection Time    09/21/13  9:30 AM      Result Value Ref Range   WBC 5.9  4.0 - 10.5 K/uL   RBC 3.10 (*) 3.87 - 5.11 MIL/uL   Hemoglobin 6.2 (*) 12.0 - 15.0 g/dL   HCT 21.5 (*) 36.0 - 46.0 %   MCV 69.4 (*) 78.0 - 100.0 fL   MCH 20.0 (*) 26.0 - 34.0 pg   MCHC 28.8 (*) 30.0 - 36.0 g/dL   RDW 18.8 (*) 11.5 - 15.5 %   Platelets 368  150 - 400 K/uL  COMPREHENSIVE METABOLIC PANEL     Status: Abnormal   Collection Time    09/21/13  9:30 AM       Result Value Ref Range   Sodium 142  137 - 147 mEq/L   Potassium 4.2  3.7 - 5.3 mEq/L   Chloride 105  96 - 112 mEq/L   CO2 20  19 - 32 mEq/L   Glucose, Bld  84  70 - 99 mg/dL   BUN 7  6 - 23 mg/dL   Creatinine, Ser 0.61  0.50 - 1.10 mg/dL   Calcium 9.2  8.4 - 10.5 mg/dL   Total Protein 8.3  6.0 - 8.3 g/dL   Albumin 3.4 (*) 3.5 - 5.2 g/dL   AST 118 (*) 0 - 37 U/L   ALT 49 (*) 0 - 35 U/L   Alkaline Phosphatase 90  39 - 117 U/L   Total Bilirubin 0.4  0.3 - 1.2 mg/dL   GFR calc non Af Amer >90  >90 mL/min   GFR calc Af Amer >90  >90 mL/min  WET PREP, GENITAL     Status: Abnormal   Collection Time    09/21/13 10:32 AM      Result Value Ref Range   Yeast Wet Prep HPF POC FEW (*) NONE SEEN   Trich, Wet Prep NONE SEEN  NONE SEEN   Clue Cells Wet Prep HPF POC MODERATE (*) NONE SEEN   WBC, Wet Prep HPF POC FEW (*) NONE SEEN   US Pelvis Complete  09/21/2013   CLINICAL DATA:  Heavy uterine bleeding for 2 months.  EXAM: TRANSABDOMINAL AND TRANSVAGINAL ULTRASOUND OF PELVIS  TECHNIQUE: Both transabdominal and transvaginal ultrasound examinations of the pelvis were performed. Transabdominal technique was performed for global imaging of the pelvis including uterus, ovaries, adnexal regions, and pelvic cul-de-sac. It was necessary to proceed with endovaginal exam following the transabdominal exam to visualize the endometrium.  COMPARISON:  None    FINDINGS: Uterus  Measurements: 13.4 x 6.2 x 7.6 cm. Multiple uterine fibroids the largest being 5.8 cm in the uterine fundus. There is a 1.2 x 1.1 x 0.9 cm submucosal fibroid in the inferior aspect of the endometrial cavity. There are 2 small fibroids in the midbody.  Endometrium  Thickness: 5.6 mm. There is a fibroid which is submucosal and protrudes into the inferior aspect of the endometrial cavity as described above.  Right ovary  Measurements: 3.5 x 2.2 x 2.0 cm. Normal appearance/no adnexal mass.  Left ovary  Measurements: 2.9 x 2.2 x 2.0 cm. Normal  appearance/no adnexal mass.  Other findings  No free fluid.    IMPRESSION: 1.2 cm submucosal fibroid in the inferior aspect of the endometrial cavity. 5.8 cm fundal fibroid. Endometrial thickness is normal.   Electronically Signed   By: Rozetta Nunnery M.D.   On: 09/21/2013 12:00    A:  DUB       Fibroid uterus, including one submucosal       Severe anemia       Elevated transaminases       Early ETOH withdrawal  P:  Admit for transfusion then discharge home on Megace       Will initiate ETOH protocol  Seabron Spates, CNM

## 2013-09-21 NOTE — Progress Notes (Signed)
CRITICAL VALUE ALERT  Critical value received:  HGB 6.2  Date of notification:  09/21/13  Time of notification:  6147  Critical value read back:yes  Nurse who received alert:  Velna Ochs RN  MD notified (1st page):  M. Williams CNM notified by phone.  Time of first page: N/A  MD notified (2nd page):N/A  Time of second page:N/A  Responding MD:  N/A  Time MD responded:  N/A

## 2013-09-21 NOTE — Discharge Summary (Signed)
Physician Discharge Summary  Patient ID: Elizabeth Bush MRN: [id number] DOB/AGE: 44-12-1970 44 y.o.  Admit date: 09/21/2013 Discharge date: 09/21/2013  Admission Diagnoses: symptomatic anemia  Discharge Diagnoses: same Active Problems:   DUB (dysfunctional uterine bleeding)   Discharged Condition: good  Hospital Course: Patient admitted for observation for the treatment of symptomatic anemia. Patient with history of 2 months of vaginal bleeding with passage of clots. Patient was found to have a hemoglobin of 6.2 on admission and reported increased fatigue and exertional dyspnea  Consults: None  Significant Diagnostic Studies: labs: Hg 6.2 post transfusion Hg 7.9 and radiology: Ultrasound:   IMPRESSION:  1.2 cm submucosal fibroid in the inferior aspect of the endometrial  cavity. 5.8 cm fundal fibroid. Endometrial thickness is normal.   Treatments: transfusion of 2 units of pRBC  Discharge Exam: Blood pressure 169/97, pulse 76, temperature 98.1 F (36.7 C), temperature source Oral, resp. rate 18, SpO2 100.00%. General appearance: alert, cooperative and no distress Resp: clear to auscultation bilaterally Cardio: regular rate and rhythm GI: soft, non-tender; bowel sounds normal; no masses,  no organomegaly Extremities: extremities normal, atraumatic, no cyanosis or edema  Disposition: 01-Home or Self Care Patient was instructed to follow up with PCP for further management of HTN.     Medication List         ALEVE PM 220-25 MG Tabs  Generic drug:  Naproxen Sod-Diphenhydramine  Take 1 tablet by mouth daily as needed (pain).     clindamycin 150 MG capsule  Commonly known as:  CLEOCIN  Take 2 capsules (300 mg total) by mouth 3 (three) times daily. May dispense as 150mg  capsules     ferrous sulfate 325 (65 FE) MG tablet  Commonly known as:  FERROUSUL  Take 1 tablet (325 mg total) by mouth 2 (two) times daily.     hydrochlorothiazide 25 MG tablet  Commonly known as:   HYDRODIURIL  Take 1 tablet (25 mg total) by mouth daily.     megestrol 40 MG tablet  Commonly known as:  MEGACE  Take 1 tablet (40 mg total) by mouth 2 (two) times daily.       Follow-up Information   Follow up with [facility]. (An appointment will be made for you to follow up in 4 weeks for further management of your vaginal bleeding)    Specialty:  Obstetrics and Gynecology   Contact information:   [place] [address] [phone]      Signed: Mora Bellman 09/21/2013, 10:18 PM

## 2013-09-21 NOTE — Progress Notes (Signed)
Pt v/s stable, B/P 164/94, with no c/o pain. Reviewed discharge instructions with patient. Pt escorted to MAU exit via wheelchair, accompanied by significant other.

## 2013-09-21 NOTE — Discharge Instructions (Signed)
Follow up with a primary care physician for the management of your high blood pressure  Megace is medication to help stop bleeding  Hydrochlorothiazide is high blood pressure medication  Abnormal Uterine Bleeding Abnormal uterine bleeding can affect women at various stages in life, including teenagers, women in their reproductive years, pregnant women, and women who have reached menopause. Several kinds of uterine bleeding are considered abnormal, including:  Bleeding or spotting between periods.   Bleeding after sexual intercourse.   Bleeding that is heavier or more than normal.   Periods that last longer than usual.  Bleeding after menopause.  Many cases of abnormal uterine bleeding are minor and simple to treat, while others are more serious. Any type of abnormal bleeding should be evaluated by your health care provider. Treatment will depend on the cause of the bleeding. HOME CARE INSTRUCTIONS Monitor your condition for any changes. The following actions may help to alleviate any discomfort you are experiencing:  Avoid the use of tampons and douches as directed by your health care provider.  Change your pads frequently. You should get regular pelvic exams and Pap tests. Keep all follow-up appointments for diagnostic tests as directed by your health care provider.  SEEK MEDICAL CARE IF:   Your bleeding lasts more than 1 week.   You feel dizzy at times.  SEEK IMMEDIATE MEDICAL CARE IF:   You pass out.   You are changing pads every 15 to 30 minutes.   You have abdominal pain.  You have a fever.   You become sweaty or weak.   You are passing large blood clots from the vagina.   You start to feel nauseous and vomit. MAKE SURE YOU:   Understand these instructions.  Will watch your condition.  Will get help right away if you are not doing well or get worse. Document Released: 05/04/2005 Document Revised: 01/04/2013 Document Reviewed: 12/01/2012 [facility]  Patient Information 2014 [place], [place].

## 2013-09-21 NOTE — MAU Note (Signed)
Bleeding for approx 2 months, bleeding through clothes Sunday & Monday, passed large clot or ? Tissue on Tuesday.  BTL 22 years ago.  Lower abd cramping.

## 2013-09-21 NOTE — Progress Notes (Addendum)
Chief Complaint:  Vaginal Bleeding  @MAUPATCONTACT @  HPI: Elizabeth Bush is a 43 y.o. [phone] with pmh of chronic GI bleed, alcohol abuse, and BTL who presents to maternity admissions reporting vaginal bleeding x 2 months associated with large clots two days ago. Pt notes vaginal bleeding that started two months ago which she thought was simply a heavy period. Monday 09/18/13 pt complained of new lower abdominal cramping. Tuesday 09/19/13 pt went to the bathroom and passed a very large dark red/brown clot. Since Tuesday the pt has continue to have lower abdominal cramping as well as well as the passage of several small blood clots. Pt is sexually active with last intercourse yesterday. Pt acknowledges dysparaeunia, vomiting x 2 weeks, fatigue, malaise, exertional dyspnea, depressed appetite.   Pt is currently drinking six 40 oz beers per day   Pregnancy Course:   Past Medical History: Past Medical History  Diagnosis Date  . Hypertension   . Gonorrhea   . Chlamydia     Past obstetric history: OB History  Gravida Para Term Preterm AB SAB TAB Ectopic Multiple Living  4 3 3  1  1   3     # Outcome Date GA Lbr Len/2nd Weight Sex Delivery Anes PTL Lv  4 TRM           3 TRM           2 TAB           1 TRM               Past Surgical History: Past Surgical History  Procedure Laterality Date  . Tubal ligation    . Tonsillectomy    . Esophagogastroduodenoscopy  05/30/2011    Procedure: ESOPHAGOGASTRODUODENOSCOPY (EGD);  Surgeon: Landry Dyke, MD;  Location: [facility];  Service: Endoscopy;  Laterality: N/A;  . Colonoscopy  05/31/2011    Procedure: COLONOSCOPY;  Surgeon: Landry Dyke, MD;  Location: [facility];  Service: Endoscopy;  Laterality: N/A;  . Givens capsule study  06/01/2011    Procedure: GIVENS CAPSULE STUDY;  Surgeon: Landry Dyke, MD;  Location: [facility];  Service: Endoscopy;  Laterality: N/A;    Family History: Family History  Problem Relation Age of Onset   . Diabetes Mother   . Hypertension Other   . Asthma Other     Social History: History  Substance Use Topics  . Smoking status: Current Some Day Smoker -- 0.25 packs/day for 5 years    Types: Cigarettes  . Smokeless tobacco: Never Used  . Alcohol Use: 3.6 oz/week    6 Cans of beer per week     Comment: 6 40 oz beers/day    Allergies:  Allergies  Allergen Reactions  . Lisinopril Swelling    Swelling of mouth/lips    Meds:  Prescriptions prior to admission  Medication Sig Dispense Refill  . clindamycin (CLEOCIN) 150 MG capsule Take 2 capsules (300 mg total) by mouth 3 (three) times daily. May dispense as 150mg  capsules  60 capsule  0  . Naproxen Sod-Diphenhydramine (ALEVE PM) 220-25 MG TABS Take 1 tablet by mouth daily as needed (pain).         ROS: Pertinent findings in history of present illness.  Physical Exam  Blood pressure 154/98, pulse 97, temperature 98 F (36.7 C), temperature source Oral, resp. rate 18. GENERAL: Well-developed, well-nourished female in no acute distress.  HEENT: normocephalic, scleral icterus, peripheral pulses 2+, cap refill within normal limits HEART: normal rate RESP:  normal effort ABDOMEN: Soft, bowel sounds x4, RUQ and epigastric tenderness to palpation, lower abdominal and suprapubic tenderness to palpation bilaterally, EXTREMITIES: Nontender, no edema NEURO: alert and oriented Speculum exam: Vagina - odorous, small amount of bleeding and clots noted Cervix - No contact bleeding Bimanual exam: Cervix closed, no CMT,  Uterus non tender, large Adnexa non tender, no masses bilaterally GC/Chlam, wet prep done Chaperone present for exam.       Labs: Results for orders placed during the hospital encounter of 09/21/13 (from the past 24 hour(s))  HCG, QUANTITATIVE, PREGNANCY     Status: None   Collection Time    09/21/13  9:30 AM      Result Value Ref Range   hCG, Beta Chain, Quant, S <1  <5 mIU/mL    Imaging:  No results  found. MAU Course: CBC Pregnancy test-negative Beta quant Wet prep G/C Ultrasound 2 units PRB BMP Type and screen  Assessment/Plan  Dysfunctional Uterine Bleeding 1. Admit for Observation 2. Ultrasound to visualize uterus/pmh of large fibroid 2. 2 units of PRBc for associated anemia with goal of >/= 8  Bacterial Vaginosis 1. Flagyl 500 mgs BID for 7 days  Abdominal cramping/back pain 1. Ibuprofen 600mg  q4-6 hrs prn for pain  Needs to follow up with a primary care provider       Medication List    ASK your doctor about these medications       ALEVE PM 220-25 MG Tabs  Generic drug:  Naproxen Sod-Diphenhydramine  Take 1 tablet by mouth daily as needed (pain).     clindamycin 150 MG capsule  Commonly known as:  CLEOCIN  Take 2 capsules (300 mg total) by mouth 3 (three) times daily. May dispense as 150mg  capsules        Melvern Banker, Student-PA 09/21/2013 10:38 AM  Seen and examined by me also Filed Vitals:   09/21/13 1046 09/21/13 1047 09/21/13 1048 09/21/13 1050  BP: 154/102 175/106 179/107 173/100  Pulse: 94 116 109 93  Temp:      TempSrc:      Resp:      SpO2: 100%  100% 100%   + orthostasis Agree with note Seabron Spates, CNM

## 2013-09-22 LAB — GC/CHLAMYDIA PROBE AMP
CT Probe RNA: NEGATIVE
GC Probe RNA: NEGATIVE

## 2013-09-22 LAB — TYPE AND SCREEN
ABO/RH(D): B POS
Antibody Screen: NEGATIVE
Unit division: 0
Unit division: 0

## 2013-09-25 NOTE — H&P (Signed)
Attestation of Attending Supervision of Advanced Practitioner (CNM/NP): Evaluation and management procedures were performed by the Advanced Practitioner under my supervision and collaboration.  I have reviewed the Advanced Practitioner's note and chart, and I agree with the management and plan.  Khaliya Golinski 09/25/2013 7:54 AM

## 2013-10-19 ENCOUNTER — Encounter: Payer: Self-pay | Admitting: Obstetrics & Gynecology

## 2014-03-19 ENCOUNTER — Encounter (HOSPITAL_COMMUNITY): Payer: Self-pay | Admitting: *Deleted

## 2016-09-29 ENCOUNTER — Ambulatory Visit: Payer: Self-pay | Admitting: Internal Medicine

## 2016-10-06 ENCOUNTER — Ambulatory Visit: Payer: Self-pay

## 2016-11-05 ENCOUNTER — Encounter: Payer: Self-pay | Admitting: Internal Medicine

## 2016-11-05 ENCOUNTER — Ambulatory Visit: Payer: Self-pay | Attending: Internal Medicine | Admitting: Internal Medicine

## 2016-11-05 VITALS — BP 160/95 | HR 92 | Temp 98.1°F | Resp 16 | Wt 203.2 lb

## 2016-11-05 DIAGNOSIS — R110 Nausea: Secondary | ICD-10-CM | POA: Insufficient documentation

## 2016-11-05 DIAGNOSIS — N760 Acute vaginitis: Secondary | ICD-10-CM | POA: Insufficient documentation

## 2016-11-05 DIAGNOSIS — R42 Dizziness and giddiness: Secondary | ICD-10-CM | POA: Insufficient documentation

## 2016-11-05 DIAGNOSIS — D500 Iron deficiency anemia secondary to blood loss (chronic): Secondary | ICD-10-CM

## 2016-11-05 DIAGNOSIS — K5730 Diverticulosis of large intestine without perforation or abscess without bleeding: Secondary | ICD-10-CM | POA: Insufficient documentation

## 2016-11-05 DIAGNOSIS — Z124 Encounter for screening for malignant neoplasm of cervix: Secondary | ICD-10-CM | POA: Insufficient documentation

## 2016-11-05 DIAGNOSIS — F1010 Alcohol abuse, uncomplicated: Secondary | ICD-10-CM | POA: Insufficient documentation

## 2016-11-05 DIAGNOSIS — B9689 Other specified bacterial agents as the cause of diseases classified elsewhere: Secondary | ICD-10-CM | POA: Insufficient documentation

## 2016-11-05 DIAGNOSIS — I10 Essential (primary) hypertension: Secondary | ICD-10-CM | POA: Insufficient documentation

## 2016-11-05 DIAGNOSIS — N938 Other specified abnormal uterine and vaginal bleeding: Secondary | ICD-10-CM | POA: Insufficient documentation

## 2016-11-05 DIAGNOSIS — K625 Hemorrhage of anus and rectum: Secondary | ICD-10-CM | POA: Insufficient documentation

## 2016-11-05 DIAGNOSIS — E876 Hypokalemia: Secondary | ICD-10-CM | POA: Insufficient documentation

## 2016-11-05 DIAGNOSIS — Z825 Family history of asthma and other chronic lower respiratory diseases: Secondary | ICD-10-CM | POA: Insufficient documentation

## 2016-11-05 DIAGNOSIS — K644 Residual hemorrhoidal skin tags: Secondary | ICD-10-CM | POA: Insufficient documentation

## 2016-11-05 DIAGNOSIS — D62 Acute posthemorrhagic anemia: Secondary | ICD-10-CM | POA: Insufficient documentation

## 2016-11-05 DIAGNOSIS — K642 Third degree hemorrhoids: Secondary | ICD-10-CM | POA: Insufficient documentation

## 2016-11-05 DIAGNOSIS — F17210 Nicotine dependence, cigarettes, uncomplicated: Secondary | ICD-10-CM | POA: Insufficient documentation

## 2016-11-05 DIAGNOSIS — D509 Iron deficiency anemia, unspecified: Secondary | ICD-10-CM | POA: Insufficient documentation

## 2016-11-05 DIAGNOSIS — Z888 Allergy status to other drugs, medicaments and biological substances status: Secondary | ICD-10-CM | POA: Insufficient documentation

## 2016-11-05 DIAGNOSIS — Z131 Encounter for screening for diabetes mellitus: Secondary | ICD-10-CM | POA: Insufficient documentation

## 2016-11-05 DIAGNOSIS — Z833 Family history of diabetes mellitus: Secondary | ICD-10-CM | POA: Insufficient documentation

## 2016-11-05 DIAGNOSIS — Z9889 Other specified postprocedural states: Secondary | ICD-10-CM | POA: Insufficient documentation

## 2016-11-05 DIAGNOSIS — Z8249 Family history of ischemic heart disease and other diseases of the circulatory system: Secondary | ICD-10-CM | POA: Insufficient documentation

## 2016-11-05 LAB — POCT GLYCOSYLATED HEMOGLOBIN (HGB A1C): Hemoglobin A1C: 5.1

## 2016-11-05 MED ORDER — AMLODIPINE BESYLATE 5 MG PO TABS: 5.0000 mg | ORAL_TABLET | Freq: Every day | ORAL | 3 refills | Status: DC

## 2016-11-05 MED ORDER — FERROUS SULFATE 325 (65 FE) MG PO TABS: 325.0000 mg | ORAL_TABLET | Freq: Two times a day (BID) | ORAL | 3 refills | Status: DC

## 2016-11-05 MED FILL — AMLODIPINE BESYLATE 5 MG TA: 5 | 30 days supply | Qty: 30 | Fill #0

## 2016-11-05 MED FILL — FERROUS SULFATE 325 MG TAB: 325 (65 FE) | 30 days supply | Qty: 60 | Fill #0

## 2016-11-05 NOTE — Progress Notes (Signed)
Patient ID: Elizabeth Bush, female    DOB: December 09, 1970  MRN: [id number]  CC: New Patient (Initial Visit)   Subjective: Elizabeth Bush is a 46 y.o. female who presents for new pt visit. No PCP in few yrs Her concerns today include:  Pt with hx of ETOH abuse, HTN, anemia  1.  C/O intermittent rectal bleeding x 3-4 yrs.  She attributes to hemorrhoids -can occur with or without BM.  She takes stool softener as needed -colonoscopy 2013.  Moderate int and ext hemorrhoids with sigmoid diverticulosis - Having to wear pad and sometimes diapers 3-4 x a wk.  When she has menses, she is unable to tell whether bleeding is from vagina or rectum because she is bleeding for rectum all the time -feels tired and dizziness when she stands up -grand-father had colon CA. Dx ago 50-60.  2.  Hx of HTN -was on meds in past -not on BP med now and no device to check -endorses HA/dizziness  3.  Hx of ETOH abuse:  "I drink because I don't move around much." -drinks three 40 oz beers a day.  "I think I drink it cause I can get it."  Endorse feeling shaky without it.  -went through out-pt treatment program yrs ago. Also went through 12 step program 25 yrs ago -want to quit. "I need to."    Patient Active Problem List   Diagnosis Date Noted  . Diabetes mellitus screening 11/05/2016  . DUB (dysfunctional uterine bleeding) 09/21/2013  . Symptomatic anemia 09/21/2013  . HTN (hypertension), malignant 05/31/2011  . Hypokalemia 05/31/2011  . GIB (gastrointestinal bleeding) 05/29/2011  . EtOH dependence ([place]) 05/29/2011  . Abdominal pain 05/29/2011  . Nausea alone 05/29/2011  . Anemia associated with acute blood loss 05/29/2011  . Bacterial vaginosis 05/29/2011     No current outpatient prescriptions on file prior to visit.   No current facility-administered medications on file prior to visit.     Allergies  Allergen Reactions  . Lisinopril Swelling    Swelling of mouth/lips    Social History    Social History  . Marital status: Single    Spouse name: N/A  . Number of children: N/A  . Years of education: N/A   Occupational History  . Not on file.   Social History Main Topics  . Smoking status: Current Some Day Smoker    Packs/day: 0.25    Years: 5.00    Types: Cigarettes  . Smokeless tobacco: Never Used  . Alcohol use 3.6 oz/week    6 Cans of beer per week     Comment: 6 40 oz beers/day  . Drug use: No  . Sexual activity: Yes    Birth control/ protection: Other-see comments, Surgical   Other Topics Concern  . Not on file   Social History Narrative  . No narrative on file    Family History  Problem Relation Age of Onset  . Diabetes Mother   . Hypertension Other   . Asthma Other     Past Surgical History:  Procedure Laterality Date  . COLONOSCOPY  05/31/2011   Procedure: COLONOSCOPY;  Surgeon: Landry Dyke, MD;  Location: [facility];  Service: Endoscopy;  Laterality: N/A;  . ESOPHAGOGASTRODUODENOSCOPY  05/30/2011   Procedure: ESOPHAGOGASTRODUODENOSCOPY (EGD);  Surgeon: Landry Dyke, MD;  Location: [facility];  Service: Endoscopy;  Laterality: N/A;  . GIVENS CAPSULE STUDY  06/01/2011   Procedure: GIVENS CAPSULE STUDY;  Surgeon: Landry Dyke, MD;  Location: [facility];  Service: Endoscopy;  Laterality: N/A;  . TONSILLECTOMY    . TUBAL LIGATION      ROS: Review of Systems  Constitutional: Positive for fatigue. Negative for activity change.  Respiratory: Negative for shortness of breath.   Cardiovascular: Negative for chest pain.  Gastrointestinal: Negative for abdominal pain, anal bleeding and blood in stool.    PHYSICAL EXAM: BP (!) 160/95   Pulse 92   Temp 98.1 F (36.7 C) (Oral)   Resp 16   Wt 203 lb 3.2 oz (92.2 kg)   SpO2 97%   BMI 31.83 kg/m   Wt Readings from Last 3 Encounters:  11/05/16 203 lb 3.2 oz (92.2 kg)  05/29/11 214 lb 8.1 oz (97.3 kg)  05/27/11 214 lb 9.6 oz (97.3 kg)   Physical Exam General appearance -  alert, well appearing, middle age AAFand in no distress Mental status - alert, oriented to person, place, and time, normal mood, behavior, speech, dress, motor activity, and thought processes Eyes: very pale conjunctiva Mouth - mucous membranes moist, pharynx normal without lesions Neck - supple, no significant adenopathy Chest - clear to auscultation, no wheezes, rales or rhonchi, symmetric air entry Heart - normal rate, regular rhythm, normal S1, S2, no murmurs, rubs, clicks or gallops Pelvic - No external lesions. No abnormal discharge in vaginal vault. She appears to have a polyp within the cervical os. No cervical motion tenderness. Uterus is enlarged. Rectal -3 very large hemorrhoids one of which is prolapsing out of the rectum with some dried blood noted Extremities - no lower extremity edema Neuro -patient with mild tremors  Results for orders placed or performed in visit on 11/05/16  POCT glycosylated hemoglobin (Hb A1C)  Result Value Ref Range   Hemoglobin A1C 5.1     ASSESSMENT AND PLAN: 1. Alcohol abuse -discussed the impact of alcohol on her overall health and family life. She is wanting to quit but finds it difficult as she does have withdrawal symptoms. We discussed trying her with naltrexone to decrease the cravings. However I would like to see her Chem-7 before doing so.  We'll bring her back in about 2 weeks to discuss further. -Will also have her LCSW reach out to her - HIV antibody - Hepatitis c antibody (reflex)  2. Essential hypertension -DASh discussed Start Norvasc comprehensive metabolic panel  3. Grade III hemorrhoids -Given the amount of bleeding that she is having with possible anemia and the size of the hemorrhoids I recommend that she see a general surgeon to have these removed. - Ambulatory referral to General Surgery - CBC  4. Diabetes mellitus screening - POCT glycosylated hemoglobin (Hb A1C)  5. Iron deficiency anemia due to chronic blood  loss - CBC - Iron and TIBC - Ferritin  6. Pap smear for cervical cancer screening - Cytology - PAP    Patient was given the opportunity to ask questions.  Patient verbalized understanding of the plan and was able to repeat key elements of the plan.   Orders Placed This Encounter  Procedures  . Comprehensive metabolic panel  . CBC  . HIV antibody  . Hepatitis c antibody (reflex)  . Iron and TIBC  . Ferritin  . Ambulatory referral to General Surgery  . POCT glycosylated hemoglobin (Hb A1C)     Requested Prescriptions   Signed Prescriptions Disp Refills  . ferrous sulfate (FERROUSUL) 325 (65 FE) MG tablet 60 tablet 3    Sig: Take 1 tablet (325 mg total) by  mouth 2 (two) times daily.  Marland Kitchen amLODipine (NORVASC) 5 MG tablet 90 tablet 3    Sig: Take 1 tablet (5 mg total) by mouth daily.    Return in about 2 weeks (around 11/19/2016).  Karle Plumber, MD, FACP

## 2016-11-05 NOTE — Patient Instructions (Addendum)
Give appointment with Elizabeth Bush. Start Norvasc for blood pressure.  Try to limit salt in the foods.  I have referred you to a surgeon to have hemorrhoids removed.  Sit in warm water 2-3 times a day for 10 minutes to help sooth hemorrhoids. Use Colace to keep stools soft and regular.

## 2016-11-06 LAB — CERVICOVAGINAL ANCILLARY ONLY: Wet Prep (BD Affirm): POSITIVE — AB

## 2016-11-06 LAB — CYTOLOGY - PAP
Chlamydia: NEGATIVE
Diagnosis: NEGATIVE
HPV: NOT DETECTED
Neisseria Gonorrhea: NEGATIVE

## 2016-11-07 ENCOUNTER — Other Ambulatory Visit: Payer: Self-pay | Admitting: Internal Medicine

## 2016-11-07 MED ORDER — CLINDAMYCIN HCL 300 MG PO CAPS: 300.0000 mg | ORAL_CAPSULE | Freq: Two times a day (BID) | ORAL | 0 refills | Status: DC

## 2016-11-09 ENCOUNTER — Telehealth: Payer: Self-pay | Admitting: Licensed Clinical Social Worker

## 2016-11-09 NOTE — Telephone Encounter (Signed)
LCSWA contacted pt via telephone to provide substance use resources. Pt agreed to schedule appointment with LCSWA to discuss treatment options on Wednesday, June 27.

## 2016-11-11 ENCOUNTER — Ambulatory Visit: Payer: Self-pay

## 2016-11-11 ENCOUNTER — Ambulatory Visit: Payer: Self-pay | Admitting: Licensed Clinical Social Worker

## 2016-11-11 ENCOUNTER — Telehealth: Payer: Self-pay

## 2016-11-11 ENCOUNTER — Ambulatory Visit: Payer: Self-pay | Attending: Internal Medicine

## 2016-11-11 DIAGNOSIS — F419 Anxiety disorder, unspecified: Secondary | ICD-10-CM

## 2016-11-11 DIAGNOSIS — F1010 Alcohol abuse, uncomplicated: Secondary | ICD-10-CM | POA: Insufficient documentation

## 2016-11-11 DIAGNOSIS — I10 Essential (primary) hypertension: Secondary | ICD-10-CM | POA: Insufficient documentation

## 2016-11-11 DIAGNOSIS — D500 Iron deficiency anemia secondary to blood loss (chronic): Secondary | ICD-10-CM | POA: Insufficient documentation

## 2016-11-11 DIAGNOSIS — F329 Major depressive disorder, single episode, unspecified: Secondary | ICD-10-CM

## 2016-11-11 DIAGNOSIS — K642 Third degree hemorrhoids: Secondary | ICD-10-CM | POA: Insufficient documentation

## 2016-11-11 DIAGNOSIS — F32A Depression, unspecified: Secondary | ICD-10-CM

## 2016-11-11 NOTE — Progress Notes (Signed)
Patient here for lab visit only 

## 2016-11-11 NOTE — BH Specialist Note (Signed)
Integrated Behavioral Health Initial Visit  MRN: [id number] Name: Elizabeth Bush   Session Start time: 11:40 AM Session End time: 12:00 PM Total time: 20 minutes  Type of Service: [place] Interpretor:No. Interpretor Name and Language: N/A   Warm Hand Off Completed.       SUBJECTIVE: Elizabeth Bush is a 46 y.o. female accompanied by patient and and minor grandson. Patient was referred by Dr. Wynetta Emery for depression, anxiety. Patient reports the following symptoms/concerns: substance use, chronic pain, withdrawn behavior, and low energy Duration of problem: Ongoing; Severity of problem: moderate  OBJECTIVE: Mood: Anxious and Affect: Appropriate Risk of harm to self or others: No plan to harm self or others   LIFE CONTEXT: Family and Social: Pt resides with significant other. She receives support from her sister, two adult daughters, and her adult son.  School/Work: Pt is unemployed. She has applied for Financial Counseling Self-Care: Pt reports substance use (alcohol) three 40 oz beers daily  Life Changes: Pt shared that her dog of eleven years recently passed away. She disclosed that she would like to become sober  GOALS ADDRESSED: Patient will reduce symptoms of: anxiety and depression and increase knowledge and/or ability of: coping skills and also: Increase adequate support systems for patient/family and Decrease self-medicating behaviors   INTERVENTIONS: Supportive Counseling, Psychoeducation and/or Health Education and Link to [employer]  Standardized Assessments completed: Patient declined screening  ASSESSMENT: Patient currently experiencing depression and anxiety triggered by ongoing substance use. She reports chronic pain, withdrawn behavior, and low energy. Patient may benefit from psychoeducation, psychotherapy, and medication management. She reports support from family. [place] educated pt on the cycle of depression and  anxiety and discussed how substance use can negatively impact pt's mental and physical health. LCSWA discussed benefits of applying healthy coping skills to decrease symptoms. Pt disclosed willingness to participate in medicine management and initiate outpatient treatment for substance use. LCSWA provided resources ADS, intensive outpatient programs, crisis intervention, psychotherapy, and medication management.  PLAN: 1. Follow up with behavioral health clinician on : Pt was encouraged to contact LCSWA if symptoms worsen or fail to improve to schedule behavioral appointments at [facility]. 2. Behavioral recommendations: LCSWA recommends that pt apply healthy coping skills discussed, comply with medication management, and utilize provided resources. Pt is encouraged to schedule follow up appointment with LCSWA 3. Referral(s): [employer] logistics/support/administrative officer (LME/Outside Clinic) and Substance Abuse Program 4. "From scale of 1-10, how likely are you to follow plan?": Vallonia Lewis, LCSW 11/12/16 10:25 AM

## 2016-11-11 NOTE — Telephone Encounter (Signed)
Contacted pt to go over pap results pt is aware of results and doesn't have any questions or concerns pt came today to have blood drawn

## 2016-11-12 LAB — HIV ANTIBODY (ROUTINE TESTING W REFLEX): HIV Screen 4th Generation wRfx: NONREACTIVE

## 2016-11-12 LAB — CBC
Hematocrit: 25.6 % — ABNORMAL LOW (ref 34.0–46.6)
Hemoglobin: 7.4 g/dL — ABNORMAL LOW (ref 11.1–15.9)
MCH: 22.2 pg — ABNORMAL LOW (ref 26.6–33.0)
MCHC: 28.9 g/dL — ABNORMAL LOW (ref 31.5–35.7)
MCV: 77 fL — ABNORMAL LOW (ref 79–97)
Platelets: 345 x10E3/uL (ref 150–379)
RBC: 3.33 x10E6/uL — ABNORMAL LOW (ref 3.77–5.28)
RDW: 20.3 % — ABNORMAL HIGH (ref 12.3–15.4)
WBC: 10.9 x10E3/uL — ABNORMAL HIGH (ref 3.4–10.8)

## 2016-11-12 LAB — COMPREHENSIVE METABOLIC PANEL
ALT: 42 IU/L — ABNORMAL HIGH (ref 0–32)
AST: 109 IU/L — ABNORMAL HIGH (ref 0–40)
Albumin/Globulin Ratio: 0.8 — ABNORMAL LOW (ref 1.2–2.2)
Albumin: 3.7 g/dL (ref 3.5–5.5)
Alkaline Phosphatase: 92 IU/L (ref 39–117)
BUN/Creatinine Ratio: 7 — ABNORMAL LOW (ref 9–23)
BUN: 4 mg/dL — ABNORMAL LOW (ref 6–24)
Bilirubin Total: 0.2 mg/dL (ref 0.0–1.2)
CO2: 16 mmol/L — ABNORMAL LOW (ref 20–29)
Calcium: 8.8 mg/dL (ref 8.7–10.2)
Chloride: 103 mmol/L (ref 96–106)
Creatinine, Ser: 0.55 mg/dL — ABNORMAL LOW (ref 0.57–1.00)
GFR calc Af Amer: 129 mL/min/{1.73_m2} (ref >59)
GFR calc non Af Amer: 112 mL/min/{1.73_m2} (ref >59)
Globulin, Total: 4.4 g/dL (ref 1.5–4.5)
Glucose: 101 mg/dL — ABNORMAL HIGH (ref 65–99)
Potassium: 3.7 mmol/L (ref 3.5–5.2)
Sodium: 135 mmol/L (ref 134–144)
Total Protein: 8.1 g/dL (ref 6.0–8.5)

## 2016-11-12 LAB — IRON AND TIBC
Iron Saturation: 67 % — ABNORMAL HIGH (ref 15–55)
Iron: 245 ug/dL — ABNORMAL HIGH (ref 27–159)
Total Iron Binding Capacity: 365 ug/dL (ref 250–450)
UIBC: 120 ug/dL — ABNORMAL LOW (ref 131–425)

## 2016-11-12 LAB — FERRITIN: Ferritin: 104 ng/mL (ref 15–150)

## 2016-11-12 LAB — HCV COMMENT:

## 2016-11-12 LAB — HEPATITIS C ANTIBODY (REFLEX): HCV Ab: <0.1 {s_co_ratio} (ref 0.0–0.9)

## 2016-11-17 ENCOUNTER — Telehealth: Payer: Self-pay

## 2016-11-17 NOTE — Telephone Encounter (Signed)
Contacted pt to go over lab results pt didn't answer lvm asking pt to give me a call at her earliest convenience   If pt calls back please give results: blood count reveals anemia. She should take the iron twice a day as prescribed on recent visit. Screening tests for hepatitis C and HIV were negative. Liver enzymes are elevated most likely due to continued alcohol use. Advised to cut back on drinking as discussed on recent visit to prevent further damage to the liver.

## 2016-12-04 ENCOUNTER — Ambulatory Visit: Payer: Self-pay | Admitting: Licensed Clinical Social Worker

## 2016-12-04 ENCOUNTER — Ambulatory Visit: Payer: Self-pay | Attending: Internal Medicine | Admitting: Internal Medicine

## 2016-12-04 ENCOUNTER — Encounter: Payer: Self-pay | Admitting: Internal Medicine

## 2016-12-04 VITALS — BP 130/88 | HR 103 | Temp 98.8°F | Resp 18 | Ht 67.0 in | Wt 203.6 lb

## 2016-12-04 DIAGNOSIS — F419 Anxiety disorder, unspecified: Secondary | ICD-10-CM | POA: Insufficient documentation

## 2016-12-04 DIAGNOSIS — F32A Depression, unspecified: Secondary | ICD-10-CM

## 2016-12-04 DIAGNOSIS — R600 Localized edema: Secondary | ICD-10-CM | POA: Insufficient documentation

## 2016-12-04 DIAGNOSIS — F329 Major depressive disorder, single episode, unspecified: Secondary | ICD-10-CM

## 2016-12-04 DIAGNOSIS — I10 Essential (primary) hypertension: Secondary | ICD-10-CM | POA: Insufficient documentation

## 2016-12-04 DIAGNOSIS — Z833 Family history of diabetes mellitus: Secondary | ICD-10-CM | POA: Insufficient documentation

## 2016-12-04 DIAGNOSIS — Z87891 Personal history of nicotine dependence: Secondary | ICD-10-CM | POA: Insufficient documentation

## 2016-12-04 DIAGNOSIS — F1010 Alcohol abuse, uncomplicated: Secondary | ICD-10-CM | POA: Insufficient documentation

## 2016-12-04 DIAGNOSIS — Z79899 Other long term (current) drug therapy: Secondary | ICD-10-CM | POA: Insufficient documentation

## 2016-12-04 DIAGNOSIS — Z888 Allergy status to other drugs, medicaments and biological substances status: Secondary | ICD-10-CM | POA: Insufficient documentation

## 2016-12-04 DIAGNOSIS — D509 Iron deficiency anemia, unspecified: Secondary | ICD-10-CM | POA: Insufficient documentation

## 2016-12-04 DIAGNOSIS — K642 Third degree hemorrhoids: Secondary | ICD-10-CM | POA: Insufficient documentation

## 2016-12-04 MED ORDER — POTASSIUM CHLORIDE ER 10 MEQ PO TBCR: 10.0000 meq | EXTENDED_RELEASE_TABLET | Freq: Every day | ORAL | 3 refills | Status: DC

## 2016-12-04 MED ORDER — ESCITALOPRAM OXALATE 10 MG PO TABS: 10.0000 mg | ORAL_TABLET | Freq: Every day | ORAL | 3 refills | Status: DC

## 2016-12-04 MED ORDER — HYDROCHLOROTHIAZIDE 12.5 MG PO TABS: 12.5000 mg | ORAL_TABLET | Freq: Every day | ORAL | 3 refills | Status: DC

## 2016-12-04 MED FILL — ESCITALOPRAM 10 MG TABLET: 10 | 30 days supply | Qty: 30 | Fill #0

## 2016-12-04 MED FILL — POTASSIUM CL 10 MEQ TAB SA: 10 | 30 days supply | Qty: 30 | Fill #0

## 2016-12-04 MED FILL — ?HYDROCHLOROTHIAZIDE 12.5MG: 12.5 | 30 days supply | Qty: 30 | Fill #0

## 2016-12-04 NOTE — Progress Notes (Signed)
Patient ID: Elizabeth Bush, female    DOB: Dec 17, 1970  MRN: [id number]  CC: Follow-up   Subjective: Elizabeth Bush is a 46 y.o. female who presents for 2 wks f/u Her concerns today include:  Hx of ETOH abuse, HTN, severe hemorrhoids, iron def anemia.  Started on Norvasc and Iron and referred to gen. surg on last visit.  AST/ALT elev.  1. Hemorrhoids/Anemia: -No appt with gen. Surgeon as yet. Elizabeth Bush is in the progress of applying for [facility] card.  -bleeding has stopped.  "It comes and goes." -taking the iron  2. ETOH -Elizabeth Bush has cut back. Down to two from eight  40 oz beers a day  3.  HTN:  Taking the Norvasc and trying to cut back on salt -c/o swelling in legs which Elizabeth Bush states comes and goes and has  had for a while -some SOB when walking up steps. No PND/orthopnea   4. +PHQ9 and GAD7 -endorses chronic depression "which goes along with the drinking." -denies SI -anxious about being around people.  "I don;t want to get up out the bed. I Just want to hide in my room.  I'm only here because I know I have a problem." -Elizabeth Bush is interested in med to help decrease her symptoms and would consider counseling but lacks transportation  Patient Active Problem List   Diagnosis Date Noted  . Diabetes mellitus screening 11/05/2016  . Alcohol abuse 11/05/2016  . Essential hypertension 11/05/2016  . Grade III hemorrhoids 11/05/2016  . DUB (dysfunctional uterine bleeding) 09/21/2013  . Symptomatic anemia 09/21/2013  . GIB (gastrointestinal bleeding) 05/29/2011  . Anemia associated with acute blood loss 05/29/2011     Current Outpatient Prescriptions on File Prior to Visit  Medication Sig Dispense Refill  . amLODipine (NORVASC) 5 MG tablet Take 1 tablet (5 mg total) by mouth daily. 90 tablet 3  . clindamycin (CLEOCIN) 300 MG capsule Take 1 capsule (300 mg total) by mouth 2 (two) times daily. 14 capsule 0  . ferrous sulfate (FERROUSUL) 325 (65 FE) MG tablet Take 1 tablet (325 mg total) by mouth 2  (two) times daily. 60 tablet 3   No current facility-administered medications on file prior to visit.     Allergies  Allergen Reactions  . Lisinopril Swelling    Swelling of mouth/lips    Social History   Social History  . Marital status: Single    Spouse name: N/A  . Number of children: N/A  . Years of education: N/A   Occupational History  . Not on file.   Social History Main Topics  . Smoking status: Former Smoker    Packs/day: 0.25    Years: 5.00    Types: Cigarettes    Quit date: 11/04/2016  . Smokeless tobacco: Never Used  . Alcohol use 3.6 oz/week    6 Cans of beer per week     Comment: 6 40 oz beers/day  . Drug use: No  . Sexual activity: Yes    Birth control/ protection: Other-see comments, Surgical   Other Topics Concern  . Not on file   Social History Narrative  . No narrative on file    Family History  Problem Relation Age of Onset  . Diabetes Mother   . Hypertension Other   . Asthma Other     Past Surgical History:  Procedure Laterality Date  . COLONOSCOPY  05/31/2011   Procedure: COLONOSCOPY;  Surgeon: Landry Dyke, MD;  Location: [facility];  Service: Endoscopy;  Laterality: N/A;  . ESOPHAGOGASTRODUODENOSCOPY  05/30/2011   Procedure: ESOPHAGOGASTRODUODENOSCOPY (EGD);  Surgeon: Landry Dyke, MD;  Location: [facility];  Service: Endoscopy;  Laterality: N/A;  . GIVENS CAPSULE STUDY  06/01/2011   Procedure: GIVENS CAPSULE STUDY;  Surgeon: Landry Dyke, MD;  Location: [facility];  Service: Endoscopy;  Laterality: N/A;  . TONSILLECTOMY    . TUBAL LIGATION      ROS: Review of Systems As stated above  PHYSICAL EXAM: BP 130/88 (BP Location: Left Arm, Patient Position: Sitting, Cuff Size: Normal)   Pulse (!) 103   Temp 98.8 F (37.1 C) (Oral)   Resp 18   Ht 5\' 7"  (1.702 m)   Wt 203 lb 9.6 oz (92.4 kg)   SpO2 98%   BMI 31.89 kg/m   Physical Exam General appearance - alert, well appearing, and in no distress Mental status  - alert, oriented to person, place, and time, normal mood, behavior, speech, dress Mouth - mucous membranes moist, pharynx normal without lesions Neck - supple, no significant adenopathy Chest - clear to auscultation, no wheezes, rales or rhonchi, symmetric air entry Heart - normal rate, regular rhythm, normal S1, S2, no murmurs, rubs, clicks or gallops Extremities - peripheral pulses normal, trace LE and pedal edema Mild asterixis  Depression screen PHQ 2/9 12/04/2016  Decreased Interest 2  Down, Depressed, Hopeless 3  PHQ - 2 Score 5  Altered sleeping 3  Tired, decreased energy 3  Change in appetite 3  Feeling bad or failure about yourself  3  Trouble concentrating 2  Moving slowly or fidgety/restless 1  Suicidal thoughts -  PHQ-9 Score 20   GAD 7 : Generalized Anxiety Score 12/04/2016 11/05/2016  Nervous, Anxious, on Edge 3 3  Control/stop worrying 3 3  Worry too much - different things 3 3  Trouble relaxing 3 3  Restless 3 1  Easily annoyed or irritable 3 3  Afraid - awful might happen 3 3  Total GAD 7 Score 21 19   ASSESSMENT AND PLAN: 1. Alcohol abuse Patient commended on cutting back. LFTs elevated most likely due to EtOH use. I would like to see levels a little lower before putting her on naltrexone  2. Anxiety and depression -Patient agreeable to starting medication. -Elizabeth Bush'll be seen by LCSW today - escitalopram (LEXAPRO) 10 MG tablet; Take 1 tablet (10 mg total) by mouth daily.  Dispense: 30 tablet; Refill: 3  3. Essential hypertension -at goal LE edema could be due to Norvasc but pt reports it has been going on prior to it Will add low dose HCTZ with K+ supplement.  Advise to return to lab after being on med for 1 wk for BMP - Basic Metabolic Panel; Future - hydrochlorothiazide (HYDRODIURIL) 12.5 MG tablet; Take 1 tablet (12.5 mg total) by mouth daily.  Dispense: 30 tablet; Refill: 3 - potassium chloride (K-DUR) 10 MEQ tablet; Take 1 tablet (10 mEq total) by mouth  daily.  Dispense: 30 tablet; Refill: 3  4. Grade III hemorrhoids -advise to fill out paper work for [facility] card ASAP so that appt can be made with surgeon   Patient was given the opportunity to ask questions.  Patient verbalized understanding of the plan and was able to repeat key elements of the plan.   Orders Placed This Encounter  Procedures  . Basic Metabolic Panel     Requested Prescriptions   Signed Prescriptions Disp Refills  . hydrochlorothiazide (HYDRODIURIL) 12.5 MG tablet 30 tablet 3  Sig: Take 1 tablet (12.5 mg total) by mouth daily.  Marland Kitchen escitalopram (LEXAPRO) 10 MG tablet 30 tablet 3    Sig: Take 1 tablet (10 mg total) by mouth daily.  . potassium chloride (K-DUR) 10 MEQ tablet 30 tablet 3    Sig: Take 1 tablet (10 mEq total) by mouth daily.    Return in about 2 months (around 02/04/2017).  Karle Plumber, MD, FACP

## 2016-12-04 NOTE — Patient Instructions (Signed)
After you have been on the new blood pressure medication HCTZ (Hydrochlorothiazide) for 1 week, please return to the lab to have your potassium and kidney function rechecked.

## 2016-12-04 NOTE — Progress Notes (Signed)
Patient has had her medications

## 2016-12-04 NOTE — BH Specialist Note (Signed)
Integrated Behavioral Health Follow Up Visit  MRN: [id number] Name: Elizabeth Bush   Session Start time: 11:10 AM Session End time: 11:25 AM Total time: 15 minutes Number of Integrated Behavioral Health Clinician visits: 2/10  Type of Service: [place] Interpretor:No. Interpretor Name and Language: N/A   Warm Hand Off Completed.       SUBJECTIVE: Elizabeth Bush is a 46 y.o. female accompanied by patient. Patient was referred by Dr. Wynetta Emery for depression and anxiety. Patient reports the following symptoms/concerns: substance use, chronic pain, withdrawn behavior, and low energy Duration of problem: Ongoing; Severity of problem: severe  OBJECTIVE: Mood: Anxious and Depressed and Affect: Appropriate Risk of harm to self or others: No plan to harm self or others   LIFE CONTEXT: Family and Social: Pt resides with significant other. She receives support from her sister, two adult daughters, and her adult son.  School/Work: Pt is unemployed. She has applied for Financial Counseling to obtain [employer] and CAFA Self-Care: Pt reports decrease in substance use (alcohol) from eight 40 ounce beers to two daily Life Changes: Pt shared that her dog of eleven years recently passed away. She disclosed that she would like to become sober and is open to medication management to assist with mental health  GOALS ADDRESSED: Patient will reduce symptoms of: anxiety and depression and increase knowledge and/or ability of: coping skills and also: Increase adequate support systems for patient/family and Decrease self-medicating behaviors  INTERVENTIONS: Solution-Focused Strategies and Supportive Counseling Standardized Assessments completed: GAD-7 and PHQ 2&9  ASSESSMENT: Patient currently experiencing depression and anxiety triggered by ongoing substance use. She reports chronic pain, withdrawn behavior, and low energy. Patient may benefit from psychotherapy  and medication management. She reports support from family. Pt is participating in medicine management through PCP. She has not initiated outpatient treatment for substance use; however, reports decrease in substance use. Pt is knowledgeable of crisis intervention, intensive outpatient programs, and psychotherapy resources. She is in the process of completing financial application for the orange card and was informed of transportation resources.   PLAN: 1. Follow up with behavioral health clinician on : Pt was encouraged tocontact LCSWA if symptoms worsen or fail to improveto schedule behavioral appointments at [facility]. 2. Behavioral recommendations: LCSWA recommends that pt apply healthy coping skills discussed, comply with medication management, and utilize provided resources. Pt is encouraged to schedule follow up appointment with LCSWA 3. Referral(s): [employer] Resources:  Transportation 4. "From scale of 1-10, how likely are you to follow plan?": 8/10  Rebekah Chesterfield, LCSW 12/08/16 10:43 AM

## 2016-12-14 ENCOUNTER — Other Ambulatory Visit: Payer: Self-pay

## 2016-12-22 ENCOUNTER — Other Ambulatory Visit: Payer: Self-pay

## 2016-12-22 ENCOUNTER — Ambulatory Visit: Payer: Self-pay

## 2016-12-25 ENCOUNTER — Other Ambulatory Visit: Payer: Self-pay

## 2016-12-28 ENCOUNTER — Other Ambulatory Visit: Payer: Self-pay | Admitting: Internal Medicine

## 2016-12-28 ENCOUNTER — Ambulatory Visit: Payer: Self-pay | Attending: Internal Medicine

## 2016-12-28 DIAGNOSIS — I10 Essential (primary) hypertension: Secondary | ICD-10-CM

## 2016-12-29 LAB — COMPREHENSIVE METABOLIC PANEL
ALT: 40 IU/L — ABNORMAL HIGH (ref 0–32)
AST: 112 IU/L — ABNORMAL HIGH (ref 0–40)
Albumin/Globulin Ratio: 0.8 — ABNORMAL LOW (ref 1.2–2.2)
Albumin: 3.6 g/dL (ref 3.5–5.5)
Alkaline Phosphatase: 104 IU/L (ref 39–117)
BUN/Creatinine Ratio: 7 — ABNORMAL LOW (ref 9–23)
BUN: 6 mg/dL (ref 6–24)
Bilirubin Total: <0.2 mg/dL (ref 0.0–1.2)
CO2: 18 mmol/L — ABNORMAL LOW (ref 20–29)
Calcium: 9 mg/dL (ref 8.7–10.2)
Chloride: 100 mmol/L (ref 96–106)
Creatinine, Ser: 0.82 mg/dL (ref 0.57–1.00)
GFR calc Af Amer: 99 mL/min/{1.73_m2} (ref >59)
GFR calc non Af Amer: 85 mL/min/{1.73_m2} (ref >59)
Globulin, Total: 4.7 g/dL — ABNORMAL HIGH (ref 1.5–4.5)
Glucose: 97 mg/dL (ref 65–99)
Potassium: 4 mmol/L (ref 3.5–5.2)
Sodium: 139 mmol/L (ref 134–144)
Total Protein: 8.3 g/dL (ref 6.0–8.5)

## 2017-01-26 MED FILL — HYDROCHLOROTHIAZIDE 12.5 MG: 12.5 | 30 days supply | Qty: 30 | Fill #1

## 2017-01-26 MED FILL — ESCITALOPRAM 10 MG TABLET: 10 | 30 days supply | Qty: 30 | Fill #1

## 2017-02-01 ENCOUNTER — Ambulatory Visit: Payer: Self-pay | Admitting: Internal Medicine

## 2017-02-04 ENCOUNTER — Encounter: Payer: Self-pay | Admitting: Internal Medicine

## 2017-02-04 ENCOUNTER — Ambulatory Visit: Payer: Self-pay | Attending: Internal Medicine | Admitting: Internal Medicine

## 2017-02-04 VITALS — BP 123/81 | HR 93 | Temp 98.2°F | Resp 18 | Ht 67.0 in | Wt 204.2 lb

## 2017-02-04 DIAGNOSIS — F419 Anxiety disorder, unspecified: Secondary | ICD-10-CM | POA: Insufficient documentation

## 2017-02-04 DIAGNOSIS — M170 Bilateral primary osteoarthritis of knee: Secondary | ICD-10-CM | POA: Insufficient documentation

## 2017-02-04 DIAGNOSIS — R269 Unspecified abnormalities of gait and mobility: Secondary | ICD-10-CM | POA: Insufficient documentation

## 2017-02-04 DIAGNOSIS — K648 Other hemorrhoids: Secondary | ICD-10-CM | POA: Insufficient documentation

## 2017-02-04 DIAGNOSIS — I10 Essential (primary) hypertension: Secondary | ICD-10-CM | POA: Insufficient documentation

## 2017-02-04 DIAGNOSIS — K642 Third degree hemorrhoids: Secondary | ICD-10-CM

## 2017-02-04 DIAGNOSIS — F329 Major depressive disorder, single episode, unspecified: Secondary | ICD-10-CM | POA: Insufficient documentation

## 2017-02-04 DIAGNOSIS — F32A Depression, unspecified: Secondary | ICD-10-CM

## 2017-02-04 DIAGNOSIS — F1021 Alcohol dependence, in remission: Secondary | ICD-10-CM | POA: Insufficient documentation

## 2017-02-04 DIAGNOSIS — K5730 Diverticulosis of large intestine without perforation or abscess without bleeding: Secondary | ICD-10-CM | POA: Insufficient documentation

## 2017-02-04 DIAGNOSIS — D500 Iron deficiency anemia secondary to blood loss (chronic): Secondary | ICD-10-CM | POA: Insufficient documentation

## 2017-02-04 DIAGNOSIS — Z87891 Personal history of nicotine dependence: Secondary | ICD-10-CM | POA: Insufficient documentation

## 2017-02-04 DIAGNOSIS — F1010 Alcohol abuse, uncomplicated: Secondary | ICD-10-CM | POA: Insufficient documentation

## 2017-02-04 DIAGNOSIS — D259 Leiomyoma of uterus, unspecified: Secondary | ICD-10-CM | POA: Insufficient documentation

## 2017-02-04 LAB — POCT URINE PREGNANCY: Preg Test, Ur: NEGATIVE

## 2017-02-04 MED ORDER — MELOXICAM 15 MG PO TABS: 15.0000 mg | ORAL_TABLET | Freq: Every day | ORAL | 0 refills | Status: DC

## 2017-02-04 MED ORDER — OMEPRAZOLE 20 MG PO CPDR: 20.0000 mg | DELAYED_RELEASE_CAPSULE | Freq: Every day | ORAL | 3 refills | Status: DC

## 2017-02-04 NOTE — [place] Notes (Signed)
Patient ID: Elizabeth Bush, female    DOB: Apr 16, 1971  MRN: [id number]  CC: No chief complaint on file.   Subjective: Elizabeth Bush is a 46 y.o. female who presents for chronic ds management f/u Her concerns today include:  Hx of ETOH abuse, HTN, severe hemorrhoids, fibroids, iron def anemia.  .  1. Hemorrhoids/rectal bleeding: -No OC as yet. Has a few other documents with her today to turn in  -still having a lot of bleeding Having to wear pad and sometimes diapers 3-4 x a wk.  When she has menses, she is unable to tell whether bleeding is from vagina or rectum because she is bleeding for rectum all the time -colonoscopy 2013.  Moderate internal and ext hemorrhoids with sigmoid diverticulosis -stopped taking iron because  "it makes me throw up."  -not sure of dizziness -"my stomach will swell like I'm pregnant" when menstruating. Has know fibroids on u/s from 2015 when she was hosp with severe anemia requiring transfusion  2. Having problems with balance, has to hold onto objects -fell at laundry mat 4 days ago; tried to catch herself but wall was not in reach. Sustained large bruise on RT lower thigh. Does not recall feeling dizzy or any palpitations. No LOC but unsure of whether she hit her head Had drank the night before.-Drinks two 40 oz a day, sometimes more  3. C/o pain in lower back, legs and ankles for months -endorses swelling in knees and feet Swelling in feet less with fluid pills prescribed on last vsit -takes BC OTC pain pills  and drinks alcohol to decrease pain -request letter for her apartment complex requesting ground level apartment. Painful climbing steps  4.Depression/Anxiety: Lexpro helps -"use to feel scared and have bad dreams all the time" but less since starting Lexapro  Patient Active Problem List   Diagnosis Date Noted  . Diabetes mellitus screening 11/05/2016  . Alcohol abuse 11/05/2016  . Essential hypertension 11/05/2016  . Grade III hemorrhoids  11/05/2016  . DUB (dysfunctional uterine bleeding) 09/21/2013  . Symptomatic anemia 09/21/2013  . GIB (gastrointestinal bleeding) 05/29/2011  . Anemia associated with acute blood loss 05/29/2011     Current Outpatient Prescriptions on File Prior to Visit  Medication Sig Dispense Refill  . amLODipine (NORVASC) 5 MG tablet Take 1 tablet (5 mg total) by mouth daily. 90 tablet 3  . clindamycin (CLEOCIN) 300 MG capsule Take 1 capsule (300 mg total) by mouth 2 (two) times daily. 14 capsule 0  . escitalopram (LEXAPRO) 10 MG tablet Take 1 tablet (10 mg total) by mouth daily. 30 tablet 3  . hydrochlorothiazide (HYDRODIURIL) 12.5 MG tablet Take 1 tablet (12.5 mg total) by mouth daily. 30 tablet 3  . potassium chloride (K-DUR) 10 MEQ tablet Take 1 tablet (10 mEq total) by mouth daily. 30 tablet 3   No current facility-administered medications on file prior to visit.     Allergies  Allergen Reactions  . Lisinopril Swelling    Swelling of mouth/lips    Social History   Social History  . Marital status: Single    Spouse name: N/A  . Number of children: N/A  . Years of education: N/A   Occupational History  . Not on file.   Social History Main Topics  . Smoking status: Former Smoker    Packs/day: 0.25    Years: 5.00    Types: Cigarettes    Quit date: 11/04/2016  . Smokeless tobacco: Never Used  . Alcohol use  3.6 oz/week    6 Cans of beer per week     Comment: 6 40 oz beers/day  . Drug use: No  . Sexual activity: Yes    Birth control/ protection: Other-see comments, Surgical   Other Topics Concern  . Not on file   Social History Narrative  . No narrative on file    Family History  Problem Relation Age of Onset  . Diabetes Mother   . Hypertension Other   . Asthma Other     Past Surgical History:  Procedure Laterality Date  . COLONOSCOPY  05/31/2011   Procedure: COLONOSCOPY;  Surgeon: Landry Dyke, MD;  Location: [facility];  Service: Endoscopy;  Laterality: N/A;    . ESOPHAGOGASTRODUODENOSCOPY  05/30/2011   Procedure: ESOPHAGOGASTRODUODENOSCOPY (EGD);  Surgeon: Landry Dyke, MD;  Location: [facility];  Service: Endoscopy;  Laterality: N/A;  . GIVENS CAPSULE STUDY  06/01/2011   Procedure: GIVENS CAPSULE STUDY;  Surgeon: Landry Dyke, MD;  Location: [facility];  Service: Endoscopy;  Laterality: N/A;  . TONSILLECTOMY    . TUBAL LIGATION      ROS: Review of Systems As stated above PHYSICAL EXAM: BP 123/81 (BP Location: Left Arm, Patient Position: Sitting, Cuff Size: Large)   Pulse 93   Temp 98.2 F (36.8 C) (Oral)   Resp 18   Ht 5\' 7"  (1.702 m)   Wt 204 lb 3.2 oz (92.6 kg)   SpO2 94%   BMI 31.98 kg/m   Physical Exam General appearance - alert, well appearing, and in no distress. Mental status - alert, oriented to person, place, and time. Tangential in thought process, difficult historian Neck - supple, no significant adenopathy Chest - clear to auscultation, no wheezes, rales or rhonchi, symmetric air entry Heart - normal rate, regular rhythm, normal S1, S2, no murmurs, rubs, clicks or gallops Neurological - cranial nerves II through XII intact, motor and sensory grossly normal bilaterally. Reflexes normal. Toes down going. Valgus deformity of knees. Gait wide base and intermittently holds onto objects in the room Musculoskeletal - mild tenderness on palpation of LS spine. Knees: no edema. + jt enlargement and moderate discomfort with passive movement. +crepitus on passive movement Ankles: no edema. Good ROM Skin: resolving 6-8 cm area of ecchymosis RT lower thigh lateral to knee  ASSESSMENT AND PLAN: 1. Gait abnormality May be combo of arthritis in knees, ETOH use. Given recent fall, I would like to check CT of head to r/o any subdural hematoma.  Given cane today. Letter written for apartment complex office - Vitamin B12; Future - Comprehensive metabolic panel; Future - DG Knee Complete 4 Views Right; Future - DG Knee Complete 4  Views Left; Future - CT Head Wo Contrast; Future - Cane adjustable wide base quad rxn given  2. Grade III hemorrhoids -stress importance of getting everything in to be considered for orange card so that our referral specialist can move forward with scheduling appointment with a general surgeon  3. Iron deficiency anemia due to chronic blood loss Change to liquid iron to see if she tolerates that better Anticipate anemia worse if she has not been taking the iron tabs - CBC; Future - ferrous sulfate 300 (60 Fe) MG/5ML syrup; Take 5 mLs (300 mg total) by mouth 2 (two) times daily with a meal.  Dispense: 150 mL; Refill: 3  4. Anxiety and depression Improved on Lexapro Continue to encourage her to cut back on alcohol. Problems with transportation prevents her from getting to [place]  meetings all of the rehabilitation programs  5. Primary osteoarthritis of both knees - DG Knee Complete 4 Views Right; Future - DG Knee Complete 4 Views Left; Future - meloxicam (MOBIC) 15 MG tablet; Take 1 tablet (15 mg total) by mouth daily.  Dispense: 30 tablet; Refill: 0 - omeprazole (PRILOSEC) 20 MG capsule; Take 1 capsule (20 mg total) by mouth daily.  Dispense: 30 capsule; Refill: 3  6. Alcohol abuse See #4 above - Folate; Future  7. Uterine leiomyoma, unspecified location - POCT urine pregnancy - US Pelvis Complete; Future - US Transvaginal Non-OB; Future - Follicle stimulating hormone; Future - Luteinizing hormone; Future   Patient was given the opportunity to ask questions.  Patient verbalized understanding of the plan and was able to repeat key elements of the plan.   Orders Placed This Encounter  Procedures  . Cane adjustable wide base quad  . DG Knee Complete 4 Views Right  . DG Knee Complete 4 Views Left  . CT Head Wo Contrast  . US Pelvis Complete  . US Transvaginal Non-OB  . CBC  . Vitamin B12  . Comprehensive metabolic panel  . Follicle stimulating hormone  . Luteinizing hormone  .  Folate  . POCT urine pregnancy     Requested Prescriptions   Signed Prescriptions Disp Refills  . meloxicam (MOBIC) 15 MG tablet 30 tablet 0    Sig: Take 1 tablet (15 mg total) by mouth daily.  Marland Kitchen omeprazole (PRILOSEC) 20 MG capsule 30 capsule 3    Sig: Take 1 capsule (20 mg total) by mouth daily.  . ferrous sulfate 300 (60 Fe) MG/5ML syrup 150 mL 3    Sig: Take 5 mLs (300 mg total) by mouth 2 (two) times daily with a meal.    Return in about 3 weeks (around 02/25/2017).  Karle Plumber, MD, FACP

## 2017-02-05 MED ORDER — FERROUS SULFATE 300 (60 FE) MG/5ML PO SYRP: 300.0000 mg | ORAL_SOLUTION | Freq: Two times a day (BID) | ORAL | 3 refills | Status: DC

## 2017-02-05 MED FILL — ?MELOXICAM 15MG TABLET: 15 | 30 days supply | Qty: 30 | Fill #0

## 2017-02-05 MED FILL — ?OMEPRAZOLE DR 20 MG CAPSUL: 20 | 30 days supply | Qty: 30 | Fill #0

## 2017-02-25 ENCOUNTER — Ambulatory Visit: Payer: Self-pay | Admitting: Internal Medicine

## 2017-02-26 ENCOUNTER — Telehealth: Payer: Self-pay | Admitting: Internal Medicine

## 2017-02-26 NOTE — Telephone Encounter (Signed)
Returned pt call. Pt didn't answer was unable to lvm

## 2017-02-26 NOTE — Telephone Encounter (Signed)
Pt called to speak with the nurse or the pcp about the appt she miss her appt yesterday due to weather, please follow up

## 2017-03-16 ENCOUNTER — Ambulatory Visit: Payer: Self-pay | Admitting: Internal Medicine

## 2017-03-26 ENCOUNTER — Other Ambulatory Visit: Payer: Self-pay | Admitting: Internal Medicine

## 2017-03-26 DIAGNOSIS — M170 Bilateral primary osteoarthritis of knee: Secondary | ICD-10-CM

## 2017-03-26 MED FILL — HYDROCHLOROTHIAZIDE 12.5 MG: 12.5 | 30 days supply | Qty: 30 | Fill #2

## 2017-03-26 MED FILL — ?OMEPRAZOLE DR 20 MG CAPSUL: 20 | 30 days supply | Qty: 30 | Fill #1

## 2017-03-26 MED FILL — ESCITALOPRAM 10 MG TABLET: 10 | 30 days supply | Qty: 30 | Fill #2

## 2017-03-26 MED FILL — AMLODIPINE BESYLATE 5 MG TA: 5 | 30 days supply | Qty: 30 | Fill #1

## 2017-04-02 ENCOUNTER — Ambulatory Visit: Payer: Self-pay | Admitting: Internal Medicine

## 2017-04-02 ENCOUNTER — Encounter: Payer: Self-pay | Admitting: Internal Medicine

## 2017-04-02 ENCOUNTER — Ambulatory Visit: Payer: Self-pay

## 2017-04-02 ENCOUNTER — Telehealth: Payer: Self-pay

## 2017-04-02 ENCOUNTER — Ambulatory Visit: Payer: Self-pay | Attending: Internal Medicine | Admitting: Internal Medicine

## 2017-04-02 VITALS — BP 120/84 | HR 92 | Temp 98.3°F | Resp 18 | Ht 67.0 in | Wt 205.0 lb

## 2017-04-02 DIAGNOSIS — M170 Bilateral primary osteoarthritis of knee: Secondary | ICD-10-CM | POA: Insufficient documentation

## 2017-04-02 DIAGNOSIS — R269 Unspecified abnormalities of gait and mobility: Secondary | ICD-10-CM | POA: Insufficient documentation

## 2017-04-02 DIAGNOSIS — D259 Leiomyoma of uterus, unspecified: Secondary | ICD-10-CM | POA: Insufficient documentation

## 2017-04-02 DIAGNOSIS — Z87891 Personal history of nicotine dependence: Secondary | ICD-10-CM | POA: Insufficient documentation

## 2017-04-02 DIAGNOSIS — I10 Essential (primary) hypertension: Secondary | ICD-10-CM | POA: Insufficient documentation

## 2017-04-02 DIAGNOSIS — F1010 Alcohol abuse, uncomplicated: Secondary | ICD-10-CM | POA: Insufficient documentation

## 2017-04-02 DIAGNOSIS — F329 Major depressive disorder, single episode, unspecified: Secondary | ICD-10-CM | POA: Insufficient documentation

## 2017-04-02 DIAGNOSIS — F419 Anxiety disorder, unspecified: Secondary | ICD-10-CM | POA: Insufficient documentation

## 2017-04-02 DIAGNOSIS — D500 Iron deficiency anemia secondary to blood loss (chronic): Secondary | ICD-10-CM | POA: Insufficient documentation

## 2017-04-02 DIAGNOSIS — S90211A Contusion of right great toe with damage to nail, initial encounter: Secondary | ICD-10-CM | POA: Insufficient documentation

## 2017-04-02 DIAGNOSIS — K649 Unspecified hemorrhoids: Secondary | ICD-10-CM | POA: Insufficient documentation

## 2017-04-02 DIAGNOSIS — M549 Dorsalgia, unspecified: Secondary | ICD-10-CM

## 2017-04-02 DIAGNOSIS — X58XXXA Exposure to other specified factors, initial encounter: Secondary | ICD-10-CM | POA: Insufficient documentation

## 2017-04-02 DIAGNOSIS — K625 Hemorrhage of anus and rectum: Secondary | ICD-10-CM

## 2017-04-02 DIAGNOSIS — M545 Low back pain: Secondary | ICD-10-CM | POA: Insufficient documentation

## 2017-04-02 DIAGNOSIS — M546 Pain in thoracic spine: Secondary | ICD-10-CM | POA: Insufficient documentation

## 2017-04-02 DIAGNOSIS — F32A Depression, unspecified: Secondary | ICD-10-CM

## 2017-04-02 MED ORDER — FERROUS SULFATE 300 (60 FE) MG/5ML PO SYRP: 300.0000 mg | ORAL_SOLUTION | Freq: Two times a day (BID) | ORAL | 3 refills | Status: DC

## 2017-04-02 MED ORDER — TRAMADOL HCL 50 MG PO TABS: 50.0000 mg | ORAL_TABLET | Freq: Two times a day (BID) | ORAL | 0 refills | Status: DC

## 2017-04-02 NOTE — Telephone Encounter (Signed)
Message receive from [facility] noting that the patient is inquiring about a cane.  Call placed to the patient. She said that she has a prescription for the cane but does not have the money for it and was inquiring if [facility] could provide her with one.  Informed her that [facility] does not supply canes.

## 2017-04-02 NOTE — [place] Notes (Signed)
Patient ID: Elizabeth Bush, female    DOB: 06-Mar-1971  MRN: [id number]  CC: Toe Pain   Subjective: Elizabeth Bush is a 46 y.o. female who presents for Her concerns today include:  Hx of ETOH abuse, HTN, severe hemorrhoids, fibroids, iron def anemia. .  1. RT foot "been messed up" ever since she fell in [place] back in September. -She is blood around and under the right big toenail  2. Pain on RT side, hip and knee.  Both knees hurt but right greater than left -Pain in the right thoracic and lumbar paraspinal muscles started after her fall in September -Pain keeps her awake at night. -taking Meloxicam which helps. "It puts me to sleep."  Takes BCs in addition to the meloxicam She babysits her grandchildren during the day.  She tries to set everything in one place so that she does not have to move around much except to get up to cook for them -I ordered x-rays of both knees and gave her a prescription for a cane on last visit.  She was unable to afford the cane.  She has not gone for her x-rays.  She depends on her daughters for transportation  3. Anemia: did not get Liq iron that was prescribed on last visit.  Apparently our pharmacy does not carry liquid iron -she has not been taking the oral iron in the meantime -Still has a lot of vaginal and rectal bleeding -She still has not turn in paperwork for orange card/[facility] discount.  She has some paperwork with her today  4. Anxiety/Dep: taking Lexapro in a.m. Does not feel she needs higher dose. "I do not feel scared as much." Denies SI/HI ideation She reports that she has cut back even further on drinking to 1/2 40 oz beer negative except as stated above a day  Patient Active Problem List   Diagnosis Date Noted  . Diabetes mellitus screening 11/05/2016  . Alcohol abuse 11/05/2016  . Essential hypertension 11/05/2016  . Grade III hemorrhoids 11/05/2016  . DUB (dysfunctional uterine bleeding) 09/21/2013  . Symptomatic anemia  09/21/2013  . GIB (gastrointestinal bleeding) 05/29/2011  . Anemia associated with acute blood loss 05/29/2011     Current Outpatient Medications on File Prior to Visit  Medication Sig Dispense Refill  . amLODipine (NORVASC) 5 MG tablet Take 1 tablet (5 mg total) by mouth daily. 90 tablet 3  . escitalopram (LEXAPRO) 10 MG tablet Take 1 tablet (10 mg total) by mouth daily. 30 tablet 3  . hydrochlorothiazide (HYDRODIURIL) 12.5 MG tablet Take 1 tablet (12.5 mg total) by mouth daily. 30 tablet 3  . meloxicam (MOBIC) 15 MG tablet TAKE 1 TABLET (15 MG TOTAL) BY MOUTH DAILY. 30 tablet 1  . omeprazole (PRILOSEC) 20 MG capsule Take 1 capsule (20 mg total) by mouth daily. 30 capsule 3  . potassium chloride (K-DUR) 10 MEQ tablet Take 1 tablet (10 mEq total) by mouth daily. 30 tablet 3  . ferrous sulfate 300 (60 Fe) MG/5ML syrup Take 5 mLs (300 mg total) by mouth 2 (two) times daily with a meal. 150 mL 3   No current facility-administered medications on file prior to visit.     Allergies  Allergen Reactions  . Lisinopril Swelling    Swelling of mouth/lips    Social History   Socioeconomic History  . Marital status: Single    Spouse name: Not on file  . Number of children: Not on file  . Years of education: Not  on file  . Highest education level: Not on file  Social Needs  . Financial resource strain: Not on file  . Food insecurity - worry: Not on file  . Food insecurity - inability: Not on file  . Transportation needs - medical: Not on file  . Transportation needs - non-medical: Not on file  Occupational History  . Not on file  Tobacco Use  . Smoking status: Former Smoker    Packs/day: 0.25    Years: 5.00    Pack years: 1.25    Types: Cigarettes    Last attempt to quit: 11/04/2016    Years since quitting: 0.4  . Smokeless tobacco: Never Used  Substance and Sexual Activity  . Alcohol use: Yes    Alcohol/week: 3.6 oz    Types: 6 Cans of beer per week    Comment: 6 40 oz  beers/day  . Drug use: No  . Sexual activity: Yes    Birth control/protection: Other-see comments, Surgical  Other Topics Concern  . Not on file  Social History Narrative  . Not on file    Family History  Problem Relation Age of Onset  . Diabetes Mother   . Hypertension Other   . Asthma Other     Past Surgical History:  Procedure Laterality Date  . COLONOSCOPY N/A 05/31/2011   Performed by Arta Silence, MD at [place]  . ESOPHAGOGASTRODUODENOSCOPY (EGD) N/A 05/30/2011   Performed by Arta Silence, MD at [place]  . GIVENS CAPSULE STUDY N/A 06/01/2011   Performed by Arta Silence, MD at [place]  . TONSILLECTOMY    . TUBAL LIGATION      ROS: Review of Systems Negative except as stated above PHYSICAL EXAM: BP 120/84 (BP Location: Left Arm, Patient Position: Sitting, Cuff Size: Normal)   Pulse 92   Temp 98.3 F (36.8 C) (Oral)   Resp 18   Ht 5\' 7"  (1.702 m)   Wt 205 lb (93 kg)   SpO2 93%   BMI 32.11 kg/m   Physical Exam General appearance - alert, well appearing, and in no distress Mental status - alert, oriented to person, place, and time, normal mood, behavior, speech, dress, motor activity, and thought processes Chest - clear to auscultation, no wheezes, rales or rhonchi, symmetric air entry Heart - normal rate, regular rhythm, normal S1, S2, no murmurs, rubs, clicks or gallops Musculoskeletal -mild tenderness on palpation of the thoracic and lumbar spine and paraspinal muscles. Right knee: Enlarged with possible effusion.  No erythema.  Moderate discomfort with attempted passive range of motion.  She walks with a limp. Skin -right big toe: Dried blood noted under the nail at the tip.  The nail is a bit raised up off of the bed.  Good peripheral pulses   ASSESSMENT AND PLAN: 1. Primary osteoarthritis of both knees -Advised patient to go and have x-rays done that were ordered on last visit. I think she has significant osteoarthritis. She will  see financial counselor today to see if we can get her signed up for the  orange card.  If approved we can refer to orthopedics -I have given a limited supply of tramadol to take as needed.  Patient advised that the medication can cause drowsiness and not to take with alcohol. -Advised to continue meloxicam and stop BCs - traMADol (ULTRAM) 50 MG tablet; Take 1 tablet (50 mg total) 2 (two) times daily by mouth.  Dispense: 60 tablet; Refill: 0  2. Iron deficiency anemia due  to chronic blood loss -Printed prescription given for liquid iron -We have rescheduled the appointment for her to go and have the pelvic ultrasound done. -Once approved for orange card she needs referral to gynecology and gastroenterology - ferrous sulfate 300 (60 Fe) MG/5ML syrup; Take 5 mLs (300 mg total) 2 (two) times daily with a meal by mouth.  Dispense: 150 mL; Refill: 3 - CBC  3. Anxiety and depression Continue Lexapro  4. Uterine leiomyoma, unspecified location - Luteinizing hormone - Follicle stimulating hormone  5. Other acute back pain - DG Thoracic Spine W/Swimmers; Future - DG Lumbar Spine Complete; Future  6. Contusion of right great toe with damage to nail, initial encounter - Ambulatory referral to Podiatry  7. Alcohol abuse - Folate  8. Gait abnormality - Vitamin B12 - Comprehensive metabolic panel  Patient was given the opportunity to ask questions.  Patient verbalized understanding of the plan and was able to repeat key elements of the plan.   No orders of the defined types were placed in this encounter.    Requested Prescriptions    No prescriptions requested or ordered in this encounter    In 2 mths. Karle Plumber, MD, FACP

## 2017-04-03 LAB — CBC
Hematocrit: 26.5 % — ABNORMAL LOW (ref 34.0–46.6)
Hemoglobin: 8.2 g/dL — ABNORMAL LOW (ref 11.1–15.9)
MCH: 23.2 pg — ABNORMAL LOW (ref 26.6–33.0)
MCHC: 30.9 g/dL — ABNORMAL LOW (ref 31.5–35.7)
MCV: 75 fL — ABNORMAL LOW (ref 79–97)
Platelets: 345 10*3/uL (ref 150–379)
RBC: 3.54 x10E6/uL — ABNORMAL LOW (ref 3.77–5.28)
RDW: 16.8 % — ABNORMAL HIGH (ref 12.3–15.4)
WBC: 7.8 10*3/uL (ref 3.4–10.8)

## 2017-04-03 LAB — FOLATE: Folate: 5.9 ng/mL (ref >3.0)

## 2017-04-03 LAB — COMPREHENSIVE METABOLIC PANEL
ALT: 33 IU/L — ABNORMAL HIGH (ref 0–32)
AST: 105 IU/L — ABNORMAL HIGH (ref 0–40)
Albumin/Globulin Ratio: 0.7 — ABNORMAL LOW (ref 1.2–2.2)
Albumin: 3.6 g/dL (ref 3.5–5.5)
Alkaline Phosphatase: 105 IU/L (ref 39–117)
BUN/Creatinine Ratio: 9 (ref 9–23)
BUN: 5 mg/dL — ABNORMAL LOW (ref 6–24)
Bilirubin Total: 0.3 mg/dL (ref 0.0–1.2)
CO2: 19 mmol/L — ABNORMAL LOW (ref 20–29)
Calcium: 9 mg/dL (ref 8.7–10.2)
Chloride: 101 mmol/L (ref 96–106)
Creatinine, Ser: 0.54 mg/dL — ABNORMAL LOW (ref 0.57–1.00)
GFR calc Af Amer: 130 mL/min/{1.73_m2} (ref >59)
GFR calc non Af Amer: 113 mL/min/{1.73_m2} (ref >59)
Globulin, Total: 5.5 g/dL — ABNORMAL HIGH (ref 1.5–4.5)
Glucose: 89 mg/dL (ref 65–99)
Potassium: 3.7 mmol/L (ref 3.5–5.2)
Sodium: 137 mmol/L (ref 134–144)
Total Protein: 9.1 g/dL — ABNORMAL HIGH (ref 6.0–8.5)

## 2017-04-03 LAB — LUTEINIZING HORMONE: LH: 20.5 m[IU]/mL

## 2017-04-03 LAB — VITAMIN B12: Vitamin B-12: 888 pg/mL (ref 232–1245)

## 2017-04-03 LAB — FOLLICLE STIMULATING HORMONE: FSH: 22.5 m[IU]/mL

## 2017-04-04 NOTE — [place] Notes (Signed)
Pt met with our financial specialist on her recent visit. We are hopeful that she will be approved for OC so I will submit the referral to GI.

## 2017-04-04 NOTE — Addendum Note (Signed)
Addended by: Karle Plumber B on: 04/04/2017 09:59 AM   Modules accepted: Orders

## 2017-04-06 MED FILL — MELOXICAM 15 MG TABLET: 15 | 30 days supply | Qty: 30 | Fill #0

## 2017-04-07 ENCOUNTER — Ambulatory Visit (HOSPITAL_COMMUNITY)
Admission: RE | Admit: 2017-04-07 | Discharge: 2017-04-07 | Disposition: A | Discharge status: Home / Self Care | Payer: Self-pay | Source: Ambulatory Visit | Attending: Internal Medicine | Admitting: Internal Medicine

## 2017-04-07 ENCOUNTER — Ambulatory Visit (HOSPITAL_COMMUNITY): Payer: Self-pay

## 2017-04-07 DIAGNOSIS — R269 Unspecified abnormalities of gait and mobility: Secondary | ICD-10-CM | POA: Insufficient documentation

## 2017-04-07 DIAGNOSIS — M549 Dorsalgia, unspecified: Secondary | ICD-10-CM | POA: Insufficient documentation

## 2017-04-07 DIAGNOSIS — M170 Bilateral primary osteoarthritis of knee: Secondary | ICD-10-CM | POA: Insufficient documentation

## 2017-04-07 DIAGNOSIS — M5136 Other intervertebral disc degeneration, lumbar region: Secondary | ICD-10-CM | POA: Insufficient documentation

## 2017-04-07 DIAGNOSIS — M25462 Effusion, left knee: Secondary | ICD-10-CM | POA: Insufficient documentation

## 2017-04-07 NOTE — Addendum Note (Signed)
Addended by: Karle Plumber B on: 04/07/2017 02:59 PM   Modules accepted: Orders

## 2017-04-12 ENCOUNTER — Other Ambulatory Visit: Payer: Self-pay | Admitting: Internal Medicine

## 2017-04-12 DIAGNOSIS — M170 Bilateral primary osteoarthritis of knee: Secondary | ICD-10-CM

## 2017-04-14 ENCOUNTER — Ambulatory Visit (HOSPITAL_COMMUNITY)
Admission: RE | Admit: 2017-04-14 | Discharge: 2017-04-14 | Disposition: A | Discharge status: Home / Self Care | Payer: Self-pay | Source: Ambulatory Visit | Attending: Internal Medicine | Admitting: Internal Medicine

## 2017-04-14 DIAGNOSIS — D259 Leiomyoma of uterus, unspecified: Secondary | ICD-10-CM

## 2017-04-14 DIAGNOSIS — D252 Subserosal leiomyoma of uterus: Secondary | ICD-10-CM | POA: Insufficient documentation

## 2017-04-23 ENCOUNTER — Telehealth: Payer: Self-pay | Admitting: *Deleted

## 2017-04-23 NOTE — Telephone Encounter (Signed)
-----   Message from Ladell Pier, MD sent at 04/04/2017  9:53 AM EST ----- Let pt know that folic acid level is in the low normal range. I recommend taking folic acid  1 mg supplement daily. She can purchase OTC. Still anemic. Please try taking the liquid iron as prescribed.  Liver enzymes still abnormal. Most likely due to continued alcohol use. Encourage to stop drinking as discussed.

## 2017-04-23 NOTE — Telephone Encounter (Signed)
Patient verified DOB Patient is aware of being anemic and liver enzymes being elevated. Patient is advised to pick up iron supplement and OTC folic acid. Patient is encouraged to stop drinking. Patient has an appointment on 04/26/17.

## 2017-04-23 NOTE — [place] Notes (Signed)
Patient is aware of being referred to ortho for the severe arthritis and encouraged to lose weight.

## 2017-04-26 ENCOUNTER — Ambulatory Visit: Payer: Self-pay | Admitting: Internal Medicine

## 2017-04-29 ENCOUNTER — Ambulatory Visit: Payer: Self-pay | Admitting: Internal Medicine

## 2017-05-05 ENCOUNTER — Other Ambulatory Visit: Payer: Self-pay | Admitting: Internal Medicine

## 2017-05-05 DIAGNOSIS — M170 Bilateral primary osteoarthritis of knee: Secondary | ICD-10-CM

## 2017-05-05 DIAGNOSIS — F419 Anxiety disorder, unspecified: Secondary | ICD-10-CM

## 2017-05-05 DIAGNOSIS — F329 Major depressive disorder, single episode, unspecified: Secondary | ICD-10-CM

## 2017-05-05 DIAGNOSIS — I10 Essential (primary) hypertension: Secondary | ICD-10-CM

## 2017-05-05 DIAGNOSIS — F32A Depression, unspecified: Secondary | ICD-10-CM

## 2017-05-05 MED FILL — ESCITALOPRAM 10 MG TABLET: 10 | 30 days supply | Qty: 30 | Fill #3

## 2017-05-05 MED FILL — ?HYDROCHLOROTHIAZIDE 12.5MG: 12.5 | 30 days supply | Qty: 30 | Fill #3

## 2017-05-05 MED FILL — MELOXICAM 15 MG TABLET: 15 | 30 days supply | Qty: 30 | Fill #1

## 2017-05-05 MED FILL — ?OMEPRAZOLE DR 20MG CAPSULE: 20 | 30 days supply | Qty: 30 | Fill #2

## 2017-05-05 MED FILL — AMLODIPINE BESYLATE 5 MG TA: 5 | 30 days supply | Qty: 30 | Fill #2

## 2017-05-14 ENCOUNTER — Encounter: Payer: Self-pay | Admitting: Internal Medicine

## 2017-05-14 ENCOUNTER — Telehealth: Payer: Self-pay | Admitting: Internal Medicine

## 2017-05-14 ENCOUNTER — Ambulatory Visit: Payer: Self-pay | Attending: Internal Medicine | Admitting: Internal Medicine

## 2017-05-14 VITALS — BP 134/80 | HR 99 | Temp 98.3°F | Resp 16 | Wt 205.2 lb

## 2017-05-14 DIAGNOSIS — D259 Leiomyoma of uterus, unspecified: Secondary | ICD-10-CM | POA: Insufficient documentation

## 2017-05-14 DIAGNOSIS — Z23 Encounter for immunization: Secondary | ICD-10-CM | POA: Insufficient documentation

## 2017-05-14 DIAGNOSIS — D500 Iron deficiency anemia secondary to blood loss (chronic): Secondary | ICD-10-CM

## 2017-05-14 DIAGNOSIS — Z79899 Other long term (current) drug therapy: Secondary | ICD-10-CM | POA: Insufficient documentation

## 2017-05-14 DIAGNOSIS — M170 Bilateral primary osteoarthritis of knee: Secondary | ICD-10-CM | POA: Insufficient documentation

## 2017-05-14 DIAGNOSIS — Z87891 Personal history of nicotine dependence: Secondary | ICD-10-CM | POA: Insufficient documentation

## 2017-05-14 DIAGNOSIS — D509 Iron deficiency anemia, unspecified: Secondary | ICD-10-CM | POA: Insufficient documentation

## 2017-05-14 DIAGNOSIS — F1010 Alcohol abuse, uncomplicated: Secondary | ICD-10-CM | POA: Insufficient documentation

## 2017-05-14 DIAGNOSIS — I10 Essential (primary) hypertension: Secondary | ICD-10-CM | POA: Insufficient documentation

## 2017-05-14 MED ORDER — FOLIC ACID 1 MG PO TABS: 1.0000 mg | ORAL_TABLET | Freq: Every day | ORAL | 1 refills | Status: DC

## 2017-05-14 MED ORDER — FERROUS SULFATE 300 (60 FE) MG/5ML PO SYRP: 300.0000 mg | ORAL_SOLUTION | Freq: Two times a day (BID) | ORAL | 3 refills | Status: DC

## 2017-05-14 NOTE — Telephone Encounter (Signed)
-----   Message from Ena Dawley sent at 05/14/2017  5:27 PM EST ----- Gyn  Still pending  They are behind  I talk to [facility] clinic  Podiatry was sent 12/14 to gccn for the next clinic on January at [place]  She need the CAFA [facility] the [employer] .   ----- Message ----- From: Ladell Pier, MD Sent: 05/14/2017   5:09 PM To: Ena Dawley  Patient now has the orange card.  She is awaiting appointment with gynecology, orthopedics and podiatry.

## 2017-05-14 NOTE — Progress Notes (Signed)
Patient ID: Elizabeth Bush, female    DOB: 03/05/1971  MRN: [id number]  CC: balance   Subjective: Elizabeth Bush is a 46 y.o. female who presents for 1 mth f/u Her concerns today include:  Hx of ETOH abuse, HTN, severe hemorrhoids,fibroids,iron def anemia. Marland Kitchen   She was approved for the OC since last visit.    1.  Iron deficiency anemia/hemorrhoids/fibroids:  has appt with GI 05/25/2017 -Still awaiting appointment with gynecology -Did not get liquid iron as yet.  She misplaced the prescription and we do not carry it at our pharmacy -Folic Acid level was low normal. She did not purchase OTC as yet.  2. HTN: compliant with Norvasc and HCTZ.  "I love salt."   3. ETOH: drinking two 40 oz beers a day down from eight. Stays up late at nights snacking  4. OA knees: fell coming down steps about 1 wk ago.  Sustained abrasion to the right shoulder. never able to get the cane.  -She will wait orthopedic appointment.  Placed on meloxicam and tramadol on last visit.  She has found them helpful Patient Active Problem List   Diagnosis Date Noted  . Diabetes mellitus screening 11/05/2016  . Alcohol abuse 11/05/2016  . Essential hypertension 11/05/2016  . Grade III hemorrhoids 11/05/2016  . DUB (dysfunctional uterine bleeding) 09/21/2013  . Symptomatic anemia 09/21/2013  . GIB (gastrointestinal bleeding) 05/29/2011  . Anemia associated with acute blood loss 05/29/2011     Current Outpatient Medications on File Prior to Visit  Medication Sig Dispense Refill  . amLODipine (NORVASC) 5 MG tablet Take 1 tablet (5 mg total) by mouth daily. 90 tablet 3  . escitalopram (LEXAPRO) 10 MG tablet Take 1 tablet (10 mg total) by mouth daily. 30 tablet 3  . hydrochlorothiazide (HYDRODIURIL) 12.5 MG tablet Take 1 tablet (12.5 mg total) by mouth daily. 30 tablet 3  . meloxicam (MOBIC) 15 MG tablet TAKE 1 TABLET (15 MG TOTAL) BY MOUTH DAILY. 30 tablet 1  . omeprazole (PRILOSEC) 20 MG capsule Take 1 capsule (20  mg total) by mouth daily. 30 capsule 3  . potassium chloride (K-DUR) 10 MEQ tablet Take 1 tablet (10 mEq total) by mouth daily. 30 tablet 3  . traMADol (ULTRAM) 50 MG tablet Take 1 tablet (50 mg total) 2 (two) times daily by mouth. 60 tablet 0   No current facility-administered medications on file prior to visit.     Allergies  Allergen Reactions  . Lisinopril Swelling    Swelling of mouth/lips    Social History   Socioeconomic History  . Marital status: Single    Spouse name: Not on file  . Number of children: Not on file  . Years of education: Not on file  . Highest education level: Not on file  Social Needs  . Financial resource strain: Not on file  . Food insecurity - worry: Not on file  . Food insecurity - inability: Not on file  . Transportation needs - medical: Not on file  . Transportation needs - non-medical: Not on file  Occupational History  . Not on file  Tobacco Use  . Smoking status: Former Smoker    Packs/day: 0.25    Years: 5.00    Pack years: 1.25    Types: Cigarettes    Last attempt to quit: 11/04/2016    Years since quitting: 0.5  . Smokeless tobacco: Never Used  Substance and Sexual Activity  . Alcohol use: Yes    Alcohol/week:  3.6 oz    Types: 6 Cans of beer per week    Comment: 6 40 oz beers/day  . Drug use: No  . Sexual activity: Yes    Birth control/protection: Other-see comments, Surgical  Other Topics Concern  . Not on file  Social History Narrative  . Not on file    Family History  Problem Relation Age of Onset  . Diabetes Mother   . Hypertension Other   . Asthma Other     Past Surgical History:  Procedure Laterality Date  . COLONOSCOPY  05/31/2011   Procedure: COLONOSCOPY;  Surgeon: Landry Dyke, MD;  Location: [facility];  Service: Endoscopy;  Laterality: N/A;  . ESOPHAGOGASTRODUODENOSCOPY  05/30/2011   Procedure: ESOPHAGOGASTRODUODENOSCOPY (EGD);  Surgeon: Landry Dyke, MD;  Location: [facility];  Service:  Endoscopy;  Laterality: N/A;  . GIVENS CAPSULE STUDY  06/01/2011   Procedure: GIVENS CAPSULE STUDY;  Surgeon: Landry Dyke, MD;  Location: [facility];  Service: Endoscopy;  Laterality: N/A;  . TONSILLECTOMY    . TUBAL LIGATION      ROS: Review of Systems Negative except as stated above PHYSICAL EXAM: BP (!) 150/99   Pulse 99   Temp 98.3 F (36.8 C) (Oral)   Resp 16   Wt 205 lb 3.2 oz (93.1 kg)   SpO2 95%   BMI 32.14 kg/m   Wt Readings from Last 3 Encounters:  05/14/17 205 lb 3.2 oz (93.1 kg)  04/02/17 205 lb (93 kg)  02/04/17 204 lb 3.2 oz (92.6 kg)  Repeat BP 134/80  Physical Exam  General appearance - alert, well appearing, and in no distress Mental status - alert, oriented to person, place, and time, normal mood, behavior, speech, dress, motor activity, and thought processes Neck - supple, no significant adenopathy Chest - clear to auscultation, no wheezes, rales or rhonchi, symmetric air entry Heart - normal rate, regular rhythm, normal S1, S2, no murmurs, rubs, clicks or gallops Extremities - peripheral pulses normal, no pedal edema, no clubbing or cyanosis  Results for orders placed or performed in visit on 04/02/17  Folate  Result Value Ref Range   Folate 5.9 >3.0 ng/mL  Luteinizing hormone  Result Value Ref Range   LH 98.3 mIU/mL  Follicle stimulating hormone  Result Value Ref Range   FSH 22.5 mIU/mL  CBC  Result Value Ref Range   WBC 7.8 3.4 - 10.8 x10E3/uL   RBC 3.54 (L) 3.77 - 5.28 x10E6/uL   Hemoglobin 8.2 (L) 11.1 - 15.9 g/dL   Hematocrit 26.5 (L) 34.0 - 46.6 %   MCV 75 (L) 79 - 97 fL   MCH 23.2 (L) 26.6 - 33.0 pg   MCHC 30.9 (L) 31.5 - 35.7 g/dL   RDW 16.8 (H) 12.3 - 15.4 %   Platelets 345 150 - 379 x10E3/uL  Vitamin B12  Result Value Ref Range   Vitamin B-12 888 232 - 1,245 pg/mL  Comprehensive metabolic panel  Result Value Ref Range   Glucose 89 65 - 99 mg/dL   BUN 5 (L) 6 - 24 mg/dL   Creatinine, Ser 0.54 (L) 0.57 - 1.00 mg/dL    GFR calc non Af Amer 113 >59 mL/min/1.73   GFR calc Af Amer 130 >59 mL/min/1.73   BUN/Creatinine Ratio 9 9 - 23   Sodium 137 134 - 144 mmol/L   Potassium 3.7 3.5 - 5.2 mmol/L   Chloride 101 96 - 106 mmol/L   CO2 19 (L) 20 - 29 mmol/L  Calcium 9.0 8.7 - 10.2 mg/dL   Total Protein 9.1 (H) 6.0 - 8.5 g/dL   Albumin 3.6 3.5 - 5.5 g/dL   Globulin, Total 5.5 (H) 1.5 - 4.5 g/dL   Albumin/Globulin Ratio 0.7 (L) 1.2 - 2.2   Bilirubin Total 0.3 0.0 - 1.2 mg/dL   Alkaline Phosphatase 105 39 - 117 IU/L   AST 105 (H) 0 - 40 IU/L   ALT 33 (H) 0 - 32 IU/L    ASSESSMENT AND PLAN: 1. Primary osteoarthritis of both knees -Continue meloxicam. Encourage weight loss. Await orthopedic appointment.  2. Iron deficiency anemia due to chronic blood loss Patient advised to take the prescription for the liquid iron to an outside pharmacy as we do not have it here.  Stressed the importance of taking the iron as prescribed given that she is anemic and continues to have bleeding episodes - ferrous sulfate 300 (60 Fe) MG/5ML syrup; Take 5 mLs (300 mg total) by mouth 2 (two) times daily with a meal.  Dispense: 150 mL; Refill: 3  3. Alcohol abuse -Likely cause of abnormal liver function tests.  Commended her on cutting back but encouraged her to do more  4. Essential hypertension At goal.  Continue amlodipine and HCTZ  5. Need for influenza vaccination - Flu Vaccine QUAD 6+ mos PF IM (Fluarix Quad PF)  Patient was given the opportunity to ask questions.  Patient verbalized understanding of the plan and was able to repeat key elements of the plan.   Orders Placed This Encounter  Procedures  . Flu Vaccine QUAD 6+ mos PF IM (Fluarix Quad PF)     Requested Prescriptions   Signed Prescriptions Disp Refills  . ferrous sulfate 300 (60 Fe) MG/5ML syrup 150 mL 3    Sig: Take 5 mLs (300 mg total) by mouth 2 (two) times daily with a meal.  . folic acid (FOLVITE) 1 MG tablet 100 tablet 1    Sig: Take 1 tablet  (1 mg total) by mouth daily.    Return in about 3 months (around 08/12/2017).  Karle Plumber, MD, FACP

## 2017-05-14 NOTE — Patient Instructions (Signed)

## 2017-05-17 NOTE — Telephone Encounter (Signed)
Contacted pt and left a detailed message regarding message and if she has any questions or concerns to give [place] a call

## 2017-05-25 ENCOUNTER — Telehealth: Payer: Self-pay | Admitting: Internal Medicine

## 2017-05-25 NOTE — Telephone Encounter (Signed)
Brook from [facility] called to state that this patient did not show up for her appointment

## 2017-06-04 ENCOUNTER — Encounter: Payer: Self-pay | Admitting: Obstetrics & Gynecology

## 2017-06-04 ENCOUNTER — Other Ambulatory Visit (HOSPITAL_COMMUNITY)
Admission: RE | Admit: 2017-06-04 | Discharge: 2017-06-04 | Disposition: A | Discharge status: Home / Self Care | Payer: Self-pay | Source: Ambulatory Visit | Attending: Obstetrics & Gynecology | Admitting: Obstetrics & Gynecology

## 2017-06-04 ENCOUNTER — Ambulatory Visit (INDEPENDENT_AMBULATORY_CARE_PROVIDER_SITE_OTHER): Payer: Self-pay | Admitting: Obstetrics & Gynecology

## 2017-06-04 VITALS — BP 131/88 | HR 113 | Ht 67.0 in | Wt 201.8 lb

## 2017-06-04 DIAGNOSIS — N938 Other specified abnormal uterine and vaginal bleeding: Secondary | ICD-10-CM

## 2017-06-04 DIAGNOSIS — F439 Reaction to severe stress, unspecified: Secondary | ICD-10-CM

## 2017-06-04 LAB — POCT PREGNANCY, URINE: Preg Test, Ur: NEGATIVE

## 2017-06-04 MED ORDER — MEDROXYPROGESTERONE ACETATE 150 MG/ML IM SUSP: 150.0000 mg | Freq: Once | INTRAMUSCULAR | Status: DC

## 2017-06-04 MED ORDER — MEDROXYPROGESTERONE ACETATE 150 MG/ML IM SUSP
150.0000 mg | Freq: Once | INTRAMUSCULAR | Status: AC
  Administered 2017-06-04: 150 mg via INTRAMUSCULAR

## 2017-06-04 NOTE — BH Specialist Note (Deleted)
Integrated Behavioral Health Initial Visit  MRN: [id number] Name: Elizabeth Bush  Number of [place] Clinician visits:: {IBH Number of Visits:21014052} Session Start time: ***  Session End time: *** Total time: {IBH Total Time:21014050}  Type of Service: [place] Interpretor:{yes [id number]} Interpretor Name and Language: ***   Warm Hand Off Completed.       SUBJECTIVE: Elizabeth Bush is a 47 y.o. female accompanied by {CHL AMB ACCOMPANIED XY:[id number]} Patient was referred by *** for ***. Patient reports the following symptoms/concerns: *** Duration of problem: ***; Severity of problem: {Mild/Moderate/Severe:20260}  OBJECTIVE: Mood: {BHH MOOD:22306} and Affect: {BHH AFFECT:22307} Risk of harm to self or others: {CHL AMB BH Suicide Current Mental Status:21022748}  LIFE CONTEXT: Family and Social: *** School/Work: *** Self-Care: *** Life Changes: ***  GOALS ADDRESSED: Patient will: 1. Reduce symptoms of: {IBH Symptoms:21014056} 2. Increase knowledge and/or ability of: {IBH Patient Tools:21014057}  3. Demonstrate ability to: {IBH Goals:21014053}  INTERVENTIONS: Interventions utilized: {IBH Interventions:21014054}  Standardized Assessments completed: {IBH Screening Tools:21014051}  ASSESSMENT: Patient currently experiencing ***.   Patient may benefit from ***.  PLAN: 1. Follow up with behavioral health clinician on : *** 2. Behavioral recommendations: *** 3. Referral(s): {IBH Referrals:21014055} 4. "From scale of 1-10, how likely are you to follow plan?": ***  [name], LCSW  Depression screen [facility] 2/9 06/04/2017 05/14/2017 04/02/2017 02/04/2017 12/04/2016  Decreased Interest 3 - 2 3 2   Down, Depressed, Hopeless 3 2 3 3 3   PHQ - 2 Score 6 2 5 6 5   Altered sleeping 3 2 3 3 3   Tired, decreased energy 3 3 3 3 3   Change in appetite 2 3 3 3 3   Feeling bad or failure about yourself  3 3 3 3 3   Trouble  concentrating 2 3 1 2 2   Moving slowly or fidgety/restless 3 3 1 3 1   Suicidal thoughts 1 0 1 1 -  PHQ-9 Score 23 19 20 24 20    GAD 7 : Generalized Anxiety Score 06/04/2017 05/14/2017 04/02/2017 02/04/2017  Nervous, Anxious, on Edge 3 3 3 3   Control/stop worrying 3 3 3 3   Worry too much - different things 3 3 3 3   Trouble relaxing 3 3 3 3   Restless 1 0 1 3  Easily annoyed or irritable 3 3 3 3   Afraid - awful might happen 3 3 3 3   Total GAD 7 Score 19 18 19  21

## 2017-06-04 NOTE — Patient Instructions (Signed)
Uterine Fibroids Uterine fibroids are tissue masses (tumors) that can develop in the womb (uterus). They are also called leiomyomas. This type of tumor is not cancerous (benign) and does not spread to other parts of the body outside of the pelvic area, which is between the hip bones. Occasionally, fibroids may develop in the fallopian tubes, in the cervix, or on the support structures (ligaments) that surround the uterus. You can have one or many fibroids. Fibroids can vary in size, weight, and where they grow in the uterus. Some can become quite large. Most fibroids do not require medical treatment. What are the causes? A fibroid can develop when a single uterine cell keeps growing (replicating). Most cells in the human body have a control mechanism that keeps them from replicating without control. What are the signs or symptoms? Symptoms may include:  Heavy bleeding during your period.  Bleeding or spotting between periods.  Pelvic pain and pressure.  Bladder problems, such as needing to urinate more often (urinary frequency) or urgently.  Inability to reproduce offspring (infertility).  Miscarriages.  How is this diagnosed? Uterine fibroids are diagnosed through a physical exam. Your health care provider may feel the lumpy tumors during a pelvic exam. Ultrasonography and an MRI may be done to determine the size, location, and number of fibroids. How is this treated? Treatment may include:  Watchful waiting. This involves getting the fibroid checked by your health care provider to see if it grows or shrinks. Follow your health care provider's recommendations for how often to have this checked.  Hormone medicines. These can be taken by mouth or given through an intrauterine device (IUD).  Surgery. ? Removing the fibroids (myomectomy) or the uterus (hysterectomy). ? Removing blood supply to the fibroids (uterine artery embolization).  If fibroids interfere with your fertility and you  want to become pregnant, your health care provider may recommend having the fibroids removed. Follow these instructions at home:  Keep all follow-up visits as directed by your health care provider. This is important.  Take over-the-counter and prescription medicines only as told by your health care provider. ? If you were prescribed a hormone treatment, take the hormone medicines exactly as directed.  Ask your health care provider about taking iron pills and increasing the amount of dark green, leafy vegetables in your diet. These actions can help to boost your blood iron levels, which may be affected by heavy menstrual bleeding.  Pay close attention to your period and tell your health care provider about any changes, such as: ? Increased blood flow that requires you to use more pads or tampons than usual per month. ? A change in the number of days that your period lasts per month. ? A change in symptoms that are associated with your period, such as abdominal cramping or back pain. Contact a health care provider if:  You have pelvic pain, back pain, or abdominal cramps that cannot be controlled with medicines.  You have an increase in bleeding between and during periods.  You soak tampons or pads in a half hour or less.  You feel lightheaded, extra tired, or weak. Get help right away if:  You faint.  You have a sudden increase in pelvic pain. This information is not intended to replace advice given to you by your health care provider. Make sure you discuss any questions you have with your health care provider. Document Released: 05/01/2000 Document Revised: 01/02/2016 Document Reviewed: 10/31/2013 Elsevier Interactive Patient Education  2018 Elsevier Inc.  

## 2017-06-04 NOTE — Progress Notes (Signed)
Patient ID: Elizabeth Bush, female   DOB: [birth year], 47 y.o.   MRN: [id number]  Chief Complaint  Patient presents with  . Fibroids    HPI Elizabeth Bush is a 47 y.o. female. Single P3 (28, 54, and 78 yo kids, 3 grands) here today with the issue of dysfunctional uterine bleeding for the last 4 years. This is her first visit to this clinic.The had an u/s that showed some subserosal bleeding. She has had transfusions in the past.  HPI  Past Medical History:  Diagnosis Date  . Chlamydia   . Gonorrhea   . Hypertension     Past Surgical History:  Procedure Laterality Date  . COLONOSCOPY  05/31/2011   Procedure: COLONOSCOPY;  Surgeon: Landry Dyke, MD;  Location: [facility];  Service: Endoscopy;  Laterality: N/A;  . ESOPHAGOGASTRODUODENOSCOPY  05/30/2011   Procedure: ESOPHAGOGASTRODUODENOSCOPY (EGD);  Surgeon: Landry Dyke, MD;  Location: [facility];  Service: Endoscopy;  Laterality: N/A;  . GIVENS CAPSULE STUDY  06/01/2011   Procedure: GIVENS CAPSULE STUDY;  Surgeon: Landry Dyke, MD;  Location: [facility];  Service: Endoscopy;  Laterality: N/A;  . TONSILLECTOMY    . TUBAL LIGATION      Family History  Problem Relation Age of Onset  . Diabetes Mother   . Hypertension Other   . Asthma Other     Social History Social History   Tobacco Use  . Smoking status: Former Smoker    Packs/day: 0.25    Years: 5.00    Pack years: 1.25    Types: Cigarettes    Last attempt to quit: 11/04/2016    Years since quitting: 0.5  . Smokeless tobacco: Never Used  Substance Use Topics  . Alcohol use: Yes    Alcohol/week: 3.6 oz    Types: 6 Cans of beer per week    Comment: 6 40 oz beers/day  . Drug use: No    Allergies  Allergen Reactions  . Lisinopril Swelling    Swelling of mouth/lips    Current Outpatient Medications  Medication Sig Dispense Refill  . amLODipine (NORVASC) 5 MG tablet Take 1 tablet (5 mg total) by mouth daily. 90 tablet 3  . escitalopram (LEXAPRO) 10 MG  tablet Take 1 tablet (10 mg total) by mouth daily. 30 tablet 3  . folic acid (FOLVITE) 1 MG tablet Take 1 tablet (1 mg total) by mouth daily. 100 tablet 1  . hydrochlorothiazide (HYDRODIURIL) 12.5 MG tablet Take 1 tablet (12.5 mg total) by mouth daily. 30 tablet 3  . meloxicam (MOBIC) 15 MG tablet TAKE 1 TABLET (15 MG TOTAL) BY MOUTH DAILY. 30 tablet 1  . omeprazole (PRILOSEC) 20 MG capsule Take 1 capsule (20 mg total) by mouth daily. 30 capsule 3  . potassium chloride (K-DUR) 10 MEQ tablet Take 1 tablet (10 mEq total) by mouth daily. 30 tablet 3  . traMADol (ULTRAM) 50 MG tablet Take 1 tablet (50 mg total) 2 (two) times daily by mouth. 60 tablet 0  . ferrous sulfate 300 (60 Fe) MG/5ML syrup Take 5 mLs (300 mg total) by mouth 2 (two) times daily with a meal. (Patient not taking: Reported on 06/04/2017) 150 mL 3   Current Facility-Administered Medications  Medication Dose Route Frequency Provider Last Rate Last Dose  . medroxyPROGESTERone (DEPO-PROVERA) injection 150 mg  150 mg Intramuscular Once Emily Filbert, MD        Review of Systems Review of Systems  She has been monogamous with her  boyfriend for 4 years. They live together.  She had a BTL. She is unemployed. She reports dyspareunia for about 4 years.  She also has an appt with GI to evaluate rectal bleeding. Pap smear normal 6/18  Blood pressure 131/88, pulse (!) 113, height 5\' 7"  (1.702 m), weight 91.5 kg (201 lb 12.8 oz).  Physical Exam Physical Exam  Breathing, conversing, and ambulating normally Well nourished, well hydrated Black female, no apparent distress  UPT negative, consent signed, time out done Cervix prepped with betadine and grasped with a single tooth tenaculum Uterus sounded to 9 cm Pipelle used for 2 passes with a moderate amount of tissue obtained. A gritty sensation was appreciated. She tolerated the procedure well.  Data Reviewed CLINICAL DATA:  Uterine fibroids  EXAM: TRANSABDOMINAL AND TRANSVAGINAL  ULTRASOUND OF PELVIS  TECHNIQUE: Both transabdominal and transvaginal ultrasound examinations of the pelvis were performed. Transabdominal technique was performed for global imaging of the pelvis including uterus, ovaries, adnexal regions, and pelvic cul-de-sac. It was necessary to proceed with endovaginal exam following the transabdominal exam to visualize the uterus, endometrium, ovaries and adnexa .  COMPARISON:  09/21/2013  FINDINGS: Uterus  Measurements: 12.6 x 5.7 x 6.9 cm. Posterior subserosal right fundal fibroid measures up to 5.4 cm. Adjacent posterior right subserosal fibroid measures up to 3.7 cm. Previously seen submucosal fibroid not visualized.  Endometrium  Thickness: 7 mm in thickness.  No focal abnormality visualized.  Right ovary  Measurements: 3.0 x 2.2 x 1.9 cm. Normal appearance/no adnexal mass.  Left ovary  Measurements: 2.8 x 2.2 x 2.7 cm. Normal appearance/no adnexal mass.  Other findings  No abnormal free fluid.  IMPRESSION: Subserosal uterine fibroids as above.  No acute findings.    Assessment    Preventative care DUB    Plan    Fill out paperwork for mammogram scholarship Check TSH and CBC To see Roselyn Reef at her convenience Come back 2 weeks for results Depo provera injection today        Akua Blethen C Kimberli Winne 06/04/2017, 9:12 AM

## 2017-06-05 LAB — TSH: TSH: 2.77 u[IU]/mL (ref 0.450–4.500)

## 2017-06-05 LAB — CBC
Hematocrit: 21.9 % — ABNORMAL LOW (ref 34.0–46.6)
Hemoglobin: 6.2 g/dL — CL (ref 11.1–15.9)
MCH: 21.5 pg — ABNORMAL LOW (ref 26.6–33.0)
MCHC: 28.3 g/dL — ABNORMAL LOW (ref 31.5–35.7)
MCV: 76 fL — ABNORMAL LOW (ref 79–97)
NRBC: 1 % — ABNORMAL HIGH (ref 0–0)
Platelets: 335 10*3/uL (ref 150–379)
RBC: 2.88 x10E6/uL — ABNORMAL LOW (ref 3.77–5.28)
RDW: 17.6 % — ABNORMAL HIGH (ref 12.3–15.4)
WBC: 8.4 10*3/uL (ref 3.4–10.8)

## 2017-06-08 ENCOUNTER — Encounter: Payer: Self-pay | Admitting: *Deleted

## 2017-06-09 ENCOUNTER — Telehealth: Payer: Self-pay | Admitting: *Deleted

## 2017-06-09 ENCOUNTER — Telehealth: Payer: Self-pay | Admitting: Internal Medicine

## 2017-06-09 NOTE — Telephone Encounter (Signed)
Brooke from [facility] called and stated patient missed her "2nd attempt" appointment.

## 2017-06-09 NOTE — Telephone Encounter (Addendum)
-----   Message from Emily Filbert, MD sent at 06/07/2017  2:44 PM EST ----- Her hemoglobin was 6.2. If she is still bleeding or feels dizzy or other symptoms of anemia, she should come to [facility] for a transfusion. Thanks  1/23 1600 Called pt and informed her of lab results indicating anemia. Pt states her bleeding has stopped since receiving injection on 1/18. She is still feeling dizzy and weak. I advised that Dr. Hulan Fray recommends she go to [facility] for a blood transfusion. Pt agreed to plan of care and voiced understanding. She stated that her transportation is not very good and she cares for her 2 grandsons. I advised that she may wait until tomorrow. Pt voiced understanding.

## 2017-06-17 ENCOUNTER — Other Ambulatory Visit (HOSPITAL_COMMUNITY): Payer: Self-pay | Admitting: Obstetrics & Gynecology

## 2017-06-17 DIAGNOSIS — Z1231 Encounter for screening mammogram for malignant neoplasm of breast: Secondary | ICD-10-CM

## 2017-06-18 ENCOUNTER — Telehealth: Payer: Self-pay | Admitting: Obstetrics & Gynecology

## 2017-06-18 NOTE — Telephone Encounter (Signed)
Patient is requesting a call back to speak to Dr. Hulan Fray about possibly getting her results over the phone due to not having transportation to come to her appointment. Needs a late afternoon appointment, if not able to get results over the phone.

## 2017-06-18 NOTE — Telephone Encounter (Addendum)
Message sent to Dr. Hulan Fray regarding pt's request to receive results by phone. Also, pt has not come to hospital for recommended blood transfusion as discussed w/pt on 1/23. Pt will be contacted once a response is received.   2/4 1045  Response received from Dr. Hulan Fray and pt notified of normal endometrial biopsy. Pt was also advised that Dr. Hulan Fray still recommends that she have a blood transfusion due to severe anemia.  She will need 3-4 units packed RBC's. Pt explained that she has great difficulty with transportation and also takes care of her grandson before and after school daily. After further discussion, pt agrees to come to [facility] on 2/9 in early am to receive transfusion. She was advised that the process will take approximately 12 hours and may require overnight stay.  Pt voiced understanding of all information and instructions given.

## 2017-06-18 NOTE — BH Specialist Note (Deleted)
Integrated Behavioral Health Initial Visit  MRN: [id number] Name: Elizabeth Bush  Number of [place] Clinician visits:: 1/6 Session Start time: ***  Session End time: *** Total time: {IBH Total Time:21014050}  Type of Service: [place] Interpretor:No. Interpretor Name and Language: n/a   Warm Hand Off Completed.       SUBJECTIVE: Elizabeth Bush is a 47 y.o. female accompanied by {CHL AMB ACCOMPANIED XE:9407680881} Patient was referred by Dr Hulan Fray for stress. Patient reports the following symptoms/concerns: *** Duration of problem: ***; Severity of problem: {Mild/Moderate/Severe:20260}  OBJECTIVE: Mood: {BHH MOOD:22306} and Affect: {BHH AFFECT:22307} Risk of harm to self or others: No plan to harm self or others ***  LIFE CONTEXT: Family and Social: *** School/Work: *** Self-Care: *** Life Changes: ***  GOALS ADDRESSED: Patient will: 1. Reduce symptoms of: stress 2. Increase knowledge and/or ability of: stress reduction  3. Demonstrate ability to: {IBH Goals:21014053}  INTERVENTIONS: Interventions utilized: {IBH Interventions:21014054}  Standardized Assessments completed: GAD-7 and PHQ 9  ASSESSMENT: Patient currently experiencing ***.   Patient may benefit from psychoeducation and brief therapeutic interventions regarding coping with symptoms of *** .  PLAN: 1. Follow up with behavioral health clinician on : *** 2. Behavioral recommendations:  -Follow Safety Plan *** -*** -Read educational materials regarding coping with symptoms of ***  3. Referral(s): {IBH Referrals:21014055} 4. "From scale of 1-10, how likely are you to follow plan?": ***  [name], LCSW  Depression screen [facility] 2/9 06/04/2017 05/14/2017 04/02/2017 02/04/2017 12/04/2016  Decreased Interest 3 - 2 3 2   Down, Depressed, Hopeless 3 2 3 3 3   PHQ - 2 Score 6 2 5 6 5   Altered sleeping 3 2 3 3 3   Tired, decreased energy 3 3 3 3 3    Change in appetite 2 3 3 3 3   Feeling bad or failure about yourself  3 3 3 3 3   Trouble concentrating 2 3 1 2 2   Moving slowly or fidgety/restless 3 3 1 3 1   Suicidal thoughts 1 0 1 1 -  PHQ-9 Score 23 19 20 24 20    GAD 7 : Generalized Anxiety Score 06/04/2017 05/14/2017 04/02/2017 02/04/2017  Nervous, Anxious, on Edge 3 3 3 3   Control/stop worrying 3 3 3 3   Worry too much - different things 3 3 3 3   Trouble relaxing 3 3 3 3   Restless 1 0 1 3  Easily annoyed or irritable 3 3 3 3   Afraid - awful might happen 3 3 3 3   Total GAD 7 Score 19 18 19  21

## 2017-06-21 ENCOUNTER — Institutional Professional Consult (permissible substitution): Payer: Self-pay

## 2017-06-21 ENCOUNTER — Ambulatory Visit: Payer: Self-pay | Admitting: Obstetrics & Gynecology

## 2017-06-21 ENCOUNTER — Ambulatory Visit: Payer: Self-pay

## 2017-06-25 NOTE — Telephone Encounter (Signed)
Patient called saying that she needs a letter to take to housing stating that she can have a service animals.

## 2017-06-26 ENCOUNTER — Encounter (HOSPITAL_COMMUNITY): Payer: Self-pay | Admitting: *Deleted

## 2017-06-26 ENCOUNTER — Observation Stay (HOSPITAL_COMMUNITY)
Admission: AD | Admit: 2017-06-26 | Discharge: 2017-06-26 | Disposition: A | Discharge status: Home / Self Care | Payer: Self-pay | Source: Ambulatory Visit | Attending: Obstetrics and Gynecology | Admitting: Obstetrics and Gynecology

## 2017-06-26 DIAGNOSIS — Z888 Allergy status to other drugs, medicaments and biological substances status: Secondary | ICD-10-CM | POA: Insufficient documentation

## 2017-06-26 DIAGNOSIS — I10 Essential (primary) hypertension: Secondary | ICD-10-CM | POA: Insufficient documentation

## 2017-06-26 DIAGNOSIS — Z79899 Other long term (current) drug therapy: Secondary | ICD-10-CM | POA: Insufficient documentation

## 2017-06-26 DIAGNOSIS — D500 Iron deficiency anemia secondary to blood loss (chronic): Secondary | ICD-10-CM

## 2017-06-26 DIAGNOSIS — D509 Iron deficiency anemia, unspecified: Secondary | ICD-10-CM

## 2017-06-26 DIAGNOSIS — D649 Anemia, unspecified: Principal | ICD-10-CM | POA: Insufficient documentation

## 2017-06-26 DIAGNOSIS — Z8619 Personal history of other infectious and parasitic diseases: Secondary | ICD-10-CM | POA: Insufficient documentation

## 2017-06-26 DIAGNOSIS — Z87891 Personal history of nicotine dependence: Secondary | ICD-10-CM | POA: Insufficient documentation

## 2017-06-26 LAB — PREPARE RBC (CROSSMATCH)

## 2017-06-26 LAB — CBC
HCT: 19.1 % — ABNORMAL LOW (ref 36.0–46.0)
Hemoglobin: 5.7 g/dL — CL (ref 12.0–15.0)
MCH: 21.3 pg — ABNORMAL LOW (ref 26.0–34.0)
MCHC: 29.8 g/dL — ABNORMAL LOW (ref 30.0–36.0)
MCV: 71.3 fL — ABNORMAL LOW (ref 78.0–100.0)
Platelets: 488 10*3/uL — ABNORMAL HIGH (ref 150–400)
RBC: 2.68 MIL/uL — ABNORMAL LOW (ref 3.87–5.11)
RDW: 17.7 % — ABNORMAL HIGH (ref 11.5–15.5)
WBC: 9.3 10*3/uL (ref 4.0–10.5)

## 2017-06-26 MED ORDER — ONDANSETRON HCL 4 MG/2ML IJ SOLN: 4.0000 mg | Freq: Four times a day (QID) | INTRAMUSCULAR | Status: DC | PRN

## 2017-06-26 MED ORDER — PRENATAL MULTIVITAMIN CH: 1.0000 | ORAL_TABLET | Freq: Every day | ORAL | Status: DC

## 2017-06-26 MED ORDER — ACETAMINOPHEN 325 MG PO TABS: 650.0000 mg | ORAL_TABLET | ORAL | Status: DC | PRN

## 2017-06-26 MED ORDER — DIPHENHYDRAMINE HCL 25 MG PO CAPS
25.0000 mg | ORAL_CAPSULE | Freq: Once | ORAL | Status: AC
  Administered 2017-06-26: 25 mg via ORAL
  Filled 2017-06-26: qty 1

## 2017-06-26 MED ORDER — SODIUM CHLORIDE 0.9 % IV SOLN: 250.0000 mL | INTRAVENOUS | Status: DC | PRN

## 2017-06-26 MED ORDER — SODIUM CHLORIDE 0.9% FLUSH: 3.0000 mL | Freq: Two times a day (BID) | INTRAVENOUS | Status: DC

## 2017-06-26 MED ORDER — SODIUM CHLORIDE 0.9 % IV SOLN
Freq: Once | INTRAVENOUS | Status: AC
  Administered 2017-06-26: 10:00:00 via INTRAVENOUS

## 2017-06-26 MED ORDER — FUROSEMIDE 10 MG/ML IJ SOLN
10.0000 mg | Freq: Once | INTRAMUSCULAR | Status: AC
  Administered 2017-06-26: 10 mg via INTRAVENOUS
  Filled 2017-06-26: qty 1

## 2017-06-26 MED ORDER — ACETAMINOPHEN 325 MG PO TABS
650.0000 mg | ORAL_TABLET | Freq: Once | ORAL | Status: AC
  Administered 2017-06-26: 650 mg via ORAL
  Filled 2017-06-26: qty 2

## 2017-06-26 MED ORDER — SODIUM CHLORIDE 0.9% FLUSH: 3.0000 mL | INTRAVENOUS | Status: DC | PRN

## 2017-06-26 MED ORDER — ZOLPIDEM TARTRATE 5 MG PO TABS: 5.0000 mg | ORAL_TABLET | Freq: Every evening | ORAL | Status: DC | PRN

## 2017-06-26 MED ORDER — ONDANSETRON HCL 4 MG PO TABS: 4.0000 mg | ORAL_TABLET | Freq: Four times a day (QID) | ORAL | Status: DC | PRN

## 2017-06-26 MED ORDER — ALUM & MAG HYDROXIDE-SIMETH 200-200-20 MG/5ML PO SUSP: 30.0000 mL | ORAL | Status: DC | PRN

## 2017-06-26 NOTE — Discharge Summary (Signed)
Physician Discharge Summary  Patient ID: Elizabeth Bush MRN: [id number] DOB/AGE: July 29, 1970 47 y.o.  Admit date: 06/26/2017 Discharge date: 06/26/2017  Admission Diagnoses: Anemia   Discharge Diagnoses:  Active Problems:   Anemia   Discharged Condition: good  Hospital Course: Pt was admitted for blood transfusion secondary to above Dx. She received 4 units of PRBC's without problems. Pt was discharged home after transfusion completed with medications and follow up as noted.   Consults: None  Significant Diagnostic Studies: labs  Treatments: Blood transfusion  Discharge Exam: Blood pressure (!) 152/84, pulse 82, temperature 98.7 F (37.1 C), temperature source Oral, resp. rate 16, height 5\' 6"  (1.676 m), weight 92.5 kg (204 lb), SpO2 100 %. Lungs clear Heart RRR Abd soft + BS  Disposition: 01-Home or Self Care  Discharge Instructions    Discharge patient   Complete by:  As directed    Discharge disposition:  01-Home or Self Care   Discharge patient date:  06/26/2017     Allergies as of 06/26/2017      Reactions   Lisinopril Swelling   Swelling of mouth/lips      Medication List    STOP taking these medications   omeprazole 20 MG capsule Commonly known as:  PRILOSEC   traMADol 50 MG tablet Commonly known as:  ULTRAM     TAKE these medications   amLODipine 5 MG tablet Commonly known as:  NORVASC Take 1 tablet (5 mg total) by mouth daily.   escitalopram 10 MG tablet Commonly known as:  LEXAPRO Take 1 tablet (10 mg total) by mouth daily.   ferrous sulfate 300 (60 Fe) MG/5ML syrup Take 5 mLs (300 mg total) by mouth 2 (two) times daily with a meal.   folic acid 1 MG tablet Commonly known as:  FOLVITE Take 1 tablet (1 mg total) by mouth daily.   hydrochlorothiazide 12.5 MG tablet Commonly known as:  HYDRODIURIL Take 1 tablet (12.5 mg total) by mouth daily.   meloxicam 15 MG tablet Commonly known as:  MOBIC TAKE 1 TABLET (15 MG TOTAL) BY MOUTH DAILY.    potassium chloride 10 MEQ tablet Commonly known as:  K-DUR Take 1 tablet (10 mEq total) by mouth daily.      Follow-up Information    [place]. Schedule an appointment as soon as possible for a visit in 2 week(s).   Why:  Make follow up appt with Dr. Madelyn Brunner information: [place] [phone]          Signed: Chancy Milroy 06/26/2017, 6:46 PM

## 2017-06-26 NOTE — Progress Notes (Signed)
Dr Elonda Husky notified of pt's admission and status. Will do blood work and then discuss with Dr Rip Harbour as to plan of care

## 2017-06-26 NOTE — MAU Note (Signed)
Per note from Dr Hulan Fray, pt to  Receive 3-4 units of blood. Dr Elonda Husky wanting blood work drawn prior to decision

## 2017-06-26 NOTE — Progress Notes (Signed)
CRITICAL VALUE ALERT  Critical Value: Hgb 5.7, Hct 19.1  Date & Time Notied:  0838 06/26/2017  Provider Notified: Gardiner Fanti  Orders Received/Actions taken: no new order, "they will finish rounds and take care of it."

## 2017-06-26 NOTE — Discharge Instructions (Signed)
Anemia Anemia is a condition in which you do not have enough red blood cells or hemoglobin. Hemoglobin is a substance in red blood cells that carries oxygen. When you do not have enough red blood cells or hemoglobin (are anemic), your body cannot get enough oxygen and your organs may not work properly. As a result, you may feel very tired or have other problems. What are the causes? Common causes of anemia include:  Excessive bleeding. Anemia can be caused by excessive bleeding inside or outside the body, including bleeding from the intestine or from periods in women.  Poor nutrition.  Long-lasting (chronic) kidney, thyroid, and liver disease.  Bone marrow disorders.  Cancer and treatments for cancer.  HIV (human immunodeficiency virus) and AIDS (acquired immunodeficiency syndrome).  Treatments for HIV and AIDS.  Spleen problems.  Blood disorders.  Infections, medicines, and autoimmune disorders that destroy red blood cells.  What are the signs or symptoms? Symptoms of this condition include:  Minor weakness.  Dizziness.  Headache.  Feeling heartbeats that are irregular or faster than normal (palpitations).  Shortness of breath, especially with exercise.  Paleness.  Cold sensitivity.  Indigestion.  Nausea.  Difficulty sleeping.  Difficulty concentrating.  Symptoms may occur suddenly or develop slowly. If your anemia is mild, you may not have symptoms. How is this diagnosed? This condition is diagnosed based on:  Blood tests.  Your medical history.  A physical exam.  Bone marrow biopsy.  Your health care provider may also check your stool (feces) for blood and may do additional testing to look for the cause of your bleeding. You may also have other tests, including:  Imaging tests, such as a CT scan or MRI.  Endoscopy.  Colonoscopy.  How is this treated? Treatment for this condition depends on the cause. If you continue to lose a lot of blood,  you may need to be treated at a hospital. Treatment may include:  Taking supplements of iron, vitamin T02, or folic acid.  Taking a hormone medicine (erythropoietin) that can help to stimulate red blood cell growth.  Having a blood transfusion. This may be needed if you lose a lot of blood.  Making changes to your diet.  Having surgery to remove your spleen.  Follow these instructions at home:  Take over-the-counter and prescription medicines only as told by your health care provider.  Take supplements only as told by your health care provider.  Follow any diet instructions that you were given.  Keep all follow-up visits as told by your health care provider. This is important. Contact a health care provider if:  You develop new bleeding anywhere in the body. Get help right away if:  You are very weak.  You are short of breath.  You have pain in your abdomen or chest.  You are dizzy or feel faint.  You have trouble concentrating.  You have bloody or black, tarry stools.  You vomit repeatedly or you vomit up blood. Summary  Anemia is a condition in which you do not have enough red blood cells or enough of a substance in your red blood cells that carries oxygen (hemoglobin).  Symptoms may occur suddenly or develop slowly.  If your anemia is mild, you may not have symptoms.  This condition is diagnosed with blood tests as well as a medical history and physical exam. Other tests may be needed.  Treatment for this condition depends on the cause of the anemia. This information is not intended to replace advice  given to you by your health care provider. Make sure you discuss any questions you have with your health care provider. Document Released: 06/11/2004 Document Revised: 06/05/2016 Document Reviewed: 06/05/2016 Elsevier Interactive Patient Education  2018 [employer].   Blood Transfusion, Care After This sheet gives you information about how to care for yourself  after your procedure. Your doctor may also give you more specific instructions. If you have problems or questions, contact your doctor. Follow these instructions at home:  Take over-the-counter and prescription medicines only as told by your doctor.  Go back to your normal activities as told by your doctor.  Follow instructions from your doctor about how to take care of the area where an IV tube was put into your vein (insertion site). Make sure you: ? Wash your hands with soap and water before you change your bandage (dressing). If there is no soap and water, use hand sanitizer. ? Change your bandage as told by your doctor.  Check your IV insertion site every day for signs of infection. Check for: ? More redness, swelling, or pain. ? More fluid or blood. ? Warmth. ? Pus or a bad smell. Contact a doctor if:  You have more redness, swelling, or pain around the IV insertion site..  You have more fluid or blood coming from the IV insertion site.  Your IV insertion site feels warm to the touch.  You have pus or a bad smell coming from the IV insertion site.  Your pee (urine) turns pink, red, or brown.  You feel weak after doing your normal activities. Get help right away if:  You have signs of a serious allergic or body defense (immune) system reaction, including: ? Itchiness. ? Hives. ? Trouble breathing. ? Anxiety. ? Pain in your chest or lower back. ? Fever, flushing, and chills. ? Fast pulse. ? Rash. ? Watery poop (diarrhea). ? Throwing up (vomiting). ? Dark pee. ? Serious headache. ? Dizziness. ? Stiff neck. ? Yellow color in your face or the white parts of your eyes (jaundice). Summary  After a blood transfusion, return to your normal activities as told by your doctor.  Every day, check for signs of infection where the IV tube was put into your vein.  Some signs of infection are warm skin, more redness and pain, more fluid or blood, and pus or a bad smell where  the needle went in.  Contact your doctor if you feel weak or have any unusual symptoms. This information is not intended to replace advice given to you by your health care provider. Make sure you discuss any questions you have with your health care provider. Document Released: 05/25/2014 Document Revised: 12/27/2015 Document Reviewed: 12/27/2015 Elsevier Interactive Patient Education  2017 [employer].

## 2017-06-26 NOTE — Progress Notes (Signed)

## 2017-06-26 NOTE — H&P (Signed)
Elizabeth Bush is an 47 y.o. female who is being admitted for blood transfusion. Pt followed by Dr. Idolina Primer. Saw Dr. Hulan Fray on 1/18 for DUB. H/H noted to be 6.2/21.9. Blood transfusion recommended but d/t transportation issues unable to do so until today. Pt with known uterine fibroids. TSH, nl and EMBX normal. Pt received Depo Provera on 06/04/17. She can not recall her LMP. She reports bleeding has been from her hemorrhoids which are managed by her PCP.   Pertinent Gynecological History: Menses: unknown Bleeding: dysfunctional uterine bleeding Contraception: tubal ligation DES exposure: denies Blood transfusions: in the past Sexually transmitted diseases: GC/C in the past Previous GYN Procedures: EMBX  Last mammogram: unknown  Last pap: scheduled  OB History: G3, P3   Menstrual History: Menarche age: 45 No LMP recorded (lmp unknown).    Past Medical History:  Diagnosis Date  . Chlamydia   . Gonorrhea   . Hypertension     Past Surgical History:  Procedure Laterality Date  . COLONOSCOPY  05/31/2011   Procedure: COLONOSCOPY;  Surgeon: Landry Dyke, MD;  Location: [facility];  Service: Endoscopy;  Laterality: N/A;  . ESOPHAGOGASTRODUODENOSCOPY  05/30/2011   Procedure: ESOPHAGOGASTRODUODENOSCOPY (EGD);  Surgeon: Landry Dyke, MD;  Location: [facility];  Service: Endoscopy;  Laterality: N/A;  . GIVENS CAPSULE STUDY  06/01/2011   Procedure: GIVENS CAPSULE STUDY;  Surgeon: Landry Dyke, MD;  Location: [facility];  Service: Endoscopy;  Laterality: N/A;  . TONSILLECTOMY    . TUBAL LIGATION      Family History  Problem Relation Age of Onset  . Diabetes Mother   . Hypertension Other   . Asthma Other     Social History:  reports that she quit smoking about 7 months ago. Her smoking use included cigarettes. She has a 1.25 pack-year smoking history. she has never used smokeless tobacco. She reports that she drinks about 3.6 oz of alcohol per week. She reports that she  does not use drugs.  Allergies:  Allergies  Allergen Reactions  . Lisinopril Swelling    Swelling of mouth/lips    Medications Prior to Admission  Medication Sig Dispense Refill Last Dose  . amLODipine (NORVASC) 5 MG tablet Take 1 tablet (5 mg total) by mouth daily. 90 tablet 3 Taking  . escitalopram (LEXAPRO) 10 MG tablet Take 1 tablet (10 mg total) by mouth daily. 30 tablet 3 Taking  . ferrous sulfate 300 (60 Fe) MG/5ML syrup Take 5 mLs (300 mg total) by mouth 2 (two) times daily with a meal. (Patient not taking: Reported on 06/04/2017) 150 mL 3 Not Taking  . folic acid (FOLVITE) 1 MG tablet Take 1 tablet (1 mg total) by mouth daily. 100 tablet 1 Taking  . hydrochlorothiazide (HYDRODIURIL) 12.5 MG tablet Take 1 tablet (12.5 mg total) by mouth daily. 30 tablet 3 Taking  . meloxicam (MOBIC) 15 MG tablet TAKE 1 TABLET (15 MG TOTAL) BY MOUTH DAILY. 30 tablet 1 Taking  . omeprazole (PRILOSEC) 20 MG capsule Take 1 capsule (20 mg total) by mouth daily. 30 capsule 3 Taking  . potassium chloride (K-DUR) 10 MEQ tablet Take 1 tablet (10 mEq total) by mouth daily. 30 tablet 3 Taking  . traMADol (ULTRAM) 50 MG tablet Take 1 tablet (50 mg total) 2 (two) times daily by mouth. 60 tablet 0 Taking    Review of Systems  Constitutional: Negative.   Respiratory: Negative.   Cardiovascular: Negative.   Gastrointestinal: Negative.   Genitourinary: Negative.  There were no vitals taken for this visit. Physical Exam  Constitutional: She appears well-developed and well-nourished.  Cardiovascular: Normal rate and regular rhythm.  Respiratory: Effort normal and breath sounds normal.  GI: Soft. Bowel sounds are normal.    Results for orders placed or performed during the hospital encounter of 06/26/17 (from the past 24 hour(s))  CBC     Status: Abnormal   Collection Time: 06/26/17  8:15 AM  Result Value Ref Range   WBC 9.3 4.0 - 10.5 K/uL   RBC 2.68 (L) 3.87 - 5.11 MIL/uL   Hemoglobin 5.7 (LL)  12.0 - 15.0 g/dL   HCT 19.1 (L) 36.0 - 46.0 %   MCV 71.3 (L) 78.0 - 100.0 fL   MCH 21.3 (L) 26.0 - 34.0 pg   MCHC 29.8 (L) 30.0 - 36.0 g/dL   RDW 17.7 (H) 11.5 - 15.5 %   Platelets 488 (H) 150 - 400 K/uL    No results found.  Assessment/Plan: Anemia Uterine fibroids Hemorrhoids   Pt presents today for blood transfusion secondary to above. Specific cause of anemia unknown, ie DUB d/t uterine fibroids or hemorrhoids. Nonetheless of cause blood transfusion needed. R/B of blood transfusion reviewed with pt. Pt verbalized understanding. Will admit for 23 observation for transfusion of 4 units PRBC's   Chancy Milroy 06/26/2017, 9:09 AM

## 2017-06-26 NOTE — MAU Note (Signed)
Spoke with pt in lobby and made her aware would do blood work this am. Dr Elonda Husky will then discuss with Dr Rip Harbour and decide on plan of care after blood work. Pt agrees with plan

## 2017-06-27 LAB — BPAM RBC
Blood Product Expiration Date: 201903012359
Blood Product Expiration Date: 201903042359
Blood Product Expiration Date: 201903042359
Blood Product Expiration Date: 201903042359
ISSUE DATE / TIME: 201902091021
ISSUE DATE / TIME: 201902091222
ISSUE DATE / TIME: 201902091433
ISSUE DATE / TIME: 201902091630
Unit Type and Rh: 5100
Unit Type and Rh: 7300
Unit Type and Rh: 7300
Unit Type and Rh: 7300

## 2017-06-27 LAB — TYPE AND SCREEN
ABO/RH(D): B POS
Antibody Screen: NEGATIVE
Unit division: 0
Unit division: 0
Unit division: 0
Unit division: 0

## 2017-06-28 NOTE — Telephone Encounter (Signed)
Called pt. And scheduled her an appt. For 07/06/17 @ 3:30.

## 2017-06-29 NOTE — Telephone Encounter (Signed)
Contacted pt to go over some questions for Dr. Wynetta Emery and to see why she missed her GI appointment. Pt did't answer lvm asking pt to give me a call at her earliest convenience

## 2017-06-30 NOTE — Telephone Encounter (Signed)
PC placed to pt.  Pt reports that she was dependent on her boyfriend for transportation and they recently broke up.  So had no way to get to the GI appts.  She called to cancel the day of her 2nd appt.  She is requesting new referral and she will work on getting transportation arranged ahead of time.I advised pt that [facility] may not want to schedule appt for her any more because of 2 non-shows. I will wait until her f/u appt with me later this mth to resubmit referral. She is requesting letter for her landlord to allow her to keep her dogs as companion dogs.  She has 2 small dogs one that she has had for 4 yrs and another that she recently acquired.  When questioned about it further, pt states she can keep the dogs but she has to pay $350 few a yr for each one.  I told her that I can write a letter asking that she be allowed to keep a dog as her companion animal.

## 2017-06-30 NOTE — Telephone Encounter (Signed)
Pt returned call and I spoke with pt on why she missed her GI appointment for a second time pt states because she was having transportation issues and she forgot. Also pt states she has 2 dogs but they are currently not I the home because she needs a letter. Pt states she has the dogs for companionship because her grandson be in school. Pt states she was told she will need another referral to GI because they wont see her.

## 2017-06-30 NOTE — Telephone Encounter (Signed)
Printed letter and place in PCP box to be signed

## 2017-07-06 ENCOUNTER — Ambulatory Visit: Payer: Self-pay | Admitting: Internal Medicine

## 2017-07-13 ENCOUNTER — Telehealth: Payer: Self-pay | Admitting: General Practice

## 2017-07-13 NOTE — Telephone Encounter (Signed)
Patient called and left message on nurse line asking when is her next appt. Called patient & reviewed all upcoming appts and recommended mychart to her. Text message sent for sign up. Patient verbalized understanding to all & had no questions

## 2017-07-14 ENCOUNTER — Ambulatory Visit
Admission: RE | Admit: 2017-07-14 | Discharge: 2017-07-14 | Disposition: A | Discharge status: Home / Self Care | Payer: No Typology Code available for payment source | Source: Ambulatory Visit | Attending: Obstetrics & Gynecology | Admitting: Obstetrics & Gynecology

## 2017-07-14 DIAGNOSIS — Z1231 Encounter for screening mammogram for malignant neoplasm of breast: Secondary | ICD-10-CM

## 2017-07-16 ENCOUNTER — Encounter: Payer: Self-pay | Admitting: Obstetrics & Gynecology

## 2017-07-16 ENCOUNTER — Ambulatory Visit (INDEPENDENT_AMBULATORY_CARE_PROVIDER_SITE_OTHER): Payer: Self-pay | Admitting: Obstetrics & Gynecology

## 2017-07-16 VITALS — BP 172/99 | HR 96 | Wt 192.0 lb

## 2017-07-16 DIAGNOSIS — D500 Iron deficiency anemia secondary to blood loss (chronic): Secondary | ICD-10-CM

## 2017-07-16 NOTE — Progress Notes (Signed)
Pt denied pain and bleeding.

## 2017-07-16 NOTE — Progress Notes (Signed)
Patient ID: Elizabeth Bush, female   DOB: 1970-07-28, 47 y.o.   MRN: [id number]  Chief Complaint  Patient presents with  . Follow-up    HPI Elizabeth Bush is a 47 y.o. female.  P3 here for followup after an admission at [facility] for a transfusion.  HPI  Past Medical History:  Diagnosis Date  . Chlamydia   . Gonorrhea   . Hypertension     Past Surgical History:  Procedure Laterality Date  . COLONOSCOPY  05/31/2011   Procedure: COLONOSCOPY;  Surgeon: Landry Dyke, MD;  Location: [facility];  Service: Endoscopy;  Laterality: N/A;  . ESOPHAGOGASTRODUODENOSCOPY  05/30/2011   Procedure: ESOPHAGOGASTRODUODENOSCOPY (EGD);  Surgeon: Landry Dyke, MD;  Location: [facility];  Service: Endoscopy;  Laterality: N/A;  . GIVENS CAPSULE STUDY  06/01/2011   Procedure: GIVENS CAPSULE STUDY;  Surgeon: Landry Dyke, MD;  Location: [facility];  Service: Endoscopy;  Laterality: N/A;  . TONSILLECTOMY    . TUBAL LIGATION      Family History  Problem Relation Age of Onset  . Diabetes Mother   . Hypertension Other   . Asthma Other     Social History Social History   Tobacco Use  . Smoking status: Former Smoker    Packs/day: 0.25    Years: 5.00    Pack years: 1.25    Types: Cigarettes    Last attempt to quit: 11/04/2016    Years since quitting: 0.6  . Smokeless tobacco: Never Used  Substance Use Topics  . Alcohol use: Yes    Alcohol/week: 3.6 oz    Types: 6 Cans of beer per week    Comment: 6 40 oz beers/day  . Drug use: No    Allergies  Allergen Reactions  . Lisinopril Swelling    Swelling of mouth/lips    Current Outpatient Medications  Medication Sig Dispense Refill  . amLODipine (NORVASC) 5 MG tablet Take 1 tablet (5 mg total) by mouth daily. 90 tablet 3  . escitalopram (LEXAPRO) 10 MG tablet Take 1 tablet (10 mg total) by mouth daily. 30 tablet 3  . folic acid (FOLVITE) 1 MG tablet Take 1 tablet (1 mg total) by mouth daily. 100 tablet 1  . hydrochlorothiazide  (HYDRODIURIL) 12.5 MG tablet Take 1 tablet (12.5 mg total) by mouth daily. 30 tablet 3  . meloxicam (MOBIC) 15 MG tablet TAKE 1 TABLET (15 MG TOTAL) BY MOUTH DAILY. 30 tablet 1  . potassium chloride (K-DUR) 10 MEQ tablet Take 1 tablet (10 mEq total) by mouth daily. 30 tablet 3  . ferrous sulfate 300 (60 Fe) MG/5ML syrup Take 5 mLs (300 mg total) by mouth 2 (two) times daily with a meal. 150 mL 3   No current facility-administered medications for this visit.     Review of Systems Review of Systems  Blood pressure (!) 172/99, pulse 96, weight 192 lb (87.1 kg).  Physical Exam Physical Exam Breathing, conversing, and ambulating normally Well nourished, well hydrated Black female, no apparent distress Abd- benign  Data Reviewed Diagnosis Endometrium, biopsy - BENIGN PROLIFERATIVE PHASE ENDOMETRIUM, SEE COMMENT.  Assessment    History of severe anemia due to fibroids- currently with no vaginal bleeding s/p depo provera 1/19   HTN (She did not take her BP meds today)  Plan   Recheck CBC Rec iron Rec continue depo provera q 3 months (her next shot will due in April) Rec that she take her BP meds  Emily Filbert 07/16/2017, 10:55 AM

## 2017-08-02 ENCOUNTER — Telehealth: Payer: Self-pay | Admitting: Internal Medicine

## 2017-08-02 DIAGNOSIS — M170 Bilateral primary osteoarthritis of knee: Secondary | ICD-10-CM

## 2017-08-02 MED ORDER — MELOXICAM 15 MG PO TABS: 15.0000 mg | ORAL_TABLET | Freq: Every day | ORAL | 1 refills | Status: DC

## 2017-08-02 MED FILL — AMLODIPINE BESYLATE 5 MG TA: 5 | 30 days supply | Qty: 30 | Fill #3

## 2017-08-02 NOTE — Telephone Encounter (Signed)
Will forward to pcp

## 2017-08-02 NOTE — Telephone Encounter (Signed)
Patient called requesting medication for her knees. She says that the pain is so bad she cannot sleep at night.

## 2017-08-03 MED FILL — MELOXICAM 15 MG TABLET: 15 | 30 days supply | Qty: 30 | Fill #0

## 2017-08-16 ENCOUNTER — Ambulatory Visit: Payer: Self-pay | Admitting: Internal Medicine

## 2017-09-14 ENCOUNTER — Ambulatory Visit: Payer: Self-pay | Attending: Internal Medicine | Admitting: Internal Medicine

## 2017-09-14 MED FILL — MELOXICAM 15 MG TABLET: 15 | 30 days supply | Qty: 30 | Fill #1

## 2017-10-05 ENCOUNTER — Encounter: Payer: Self-pay | Admitting: Internal Medicine

## 2017-10-05 ENCOUNTER — Ambulatory Visit: Payer: Self-pay | Attending: Internal Medicine | Admitting: Internal Medicine

## 2017-10-05 VITALS — BP 154/81 | HR 91 | Temp 98.6°F | Resp 16 | Wt 199.8 lb

## 2017-10-05 DIAGNOSIS — D509 Iron deficiency anemia, unspecified: Secondary | ICD-10-CM | POA: Insufficient documentation

## 2017-10-05 DIAGNOSIS — D500 Iron deficiency anemia secondary to blood loss (chronic): Secondary | ICD-10-CM

## 2017-10-05 DIAGNOSIS — Z8742 Personal history of other diseases of the female genital tract: Secondary | ICD-10-CM

## 2017-10-05 DIAGNOSIS — Z87891 Personal history of nicotine dependence: Secondary | ICD-10-CM | POA: Insufficient documentation

## 2017-10-05 DIAGNOSIS — F32A Depression, unspecified: Secondary | ICD-10-CM

## 2017-10-05 DIAGNOSIS — I10 Essential (primary) hypertension: Secondary | ICD-10-CM | POA: Insufficient documentation

## 2017-10-05 DIAGNOSIS — D259 Leiomyoma of uterus, unspecified: Secondary | ICD-10-CM | POA: Insufficient documentation

## 2017-10-05 DIAGNOSIS — F419 Anxiety disorder, unspecified: Secondary | ICD-10-CM | POA: Insufficient documentation

## 2017-10-05 DIAGNOSIS — M170 Bilateral primary osteoarthritis of knee: Secondary | ICD-10-CM | POA: Insufficient documentation

## 2017-10-05 DIAGNOSIS — N920 Excessive and frequent menstruation with regular cycle: Secondary | ICD-10-CM | POA: Insufficient documentation

## 2017-10-05 DIAGNOSIS — F1010 Alcohol abuse, uncomplicated: Secondary | ICD-10-CM | POA: Insufficient documentation

## 2017-10-05 DIAGNOSIS — Z833 Family history of diabetes mellitus: Secondary | ICD-10-CM | POA: Insufficient documentation

## 2017-10-05 DIAGNOSIS — Z8249 Family history of ischemic heart disease and other diseases of the circulatory system: Secondary | ICD-10-CM | POA: Insufficient documentation

## 2017-10-05 DIAGNOSIS — Z9889 Other specified postprocedural states: Secondary | ICD-10-CM | POA: Insufficient documentation

## 2017-10-05 DIAGNOSIS — Z9851 Tubal ligation status: Secondary | ICD-10-CM | POA: Insufficient documentation

## 2017-10-05 DIAGNOSIS — F329 Major depressive disorder, single episode, unspecified: Secondary | ICD-10-CM | POA: Insufficient documentation

## 2017-10-05 MED ORDER — MELOXICAM 15 MG PO TABS: 15.0000 mg | ORAL_TABLET | Freq: Every day | ORAL | 1 refills | Status: DC

## 2017-10-05 MED ORDER — HYDROCHLOROTHIAZIDE 12.5 MG PO TABS: 12.5000 mg | ORAL_TABLET | Freq: Every day | ORAL | 3 refills | Status: DC

## 2017-10-05 MED ORDER — FERROUS SULFATE 300 (60 FE) MG/5ML PO SYRP: 300.0000 mg | ORAL_SOLUTION | Freq: Two times a day (BID) | ORAL | 3 refills | Status: DC

## 2017-10-05 MED ORDER — FOLIC ACID 1 MG PO TABS: 1.0000 mg | ORAL_TABLET | Freq: Every day | ORAL | 1 refills | Status: DC

## 2017-10-05 MED ORDER — AMLODIPINE BESYLATE 5 MG PO TABS: 5.0000 mg | ORAL_TABLET | Freq: Every day | ORAL | 3 refills | Status: DC

## 2017-10-05 MED ORDER — ESCITALOPRAM OXALATE 10 MG PO TABS: 10.0000 mg | ORAL_TABLET | Freq: Every day | ORAL | 3 refills | Status: DC

## 2017-10-05 MED ORDER — POTASSIUM CHLORIDE ER 10 MEQ PO TBCR: 10.0000 meq | EXTENDED_RELEASE_TABLET | Freq: Every day | ORAL | 3 refills | Status: DC

## 2017-10-05 MED ORDER — MEDROXYPROGESTERONE ACETATE 150 MG/ML IM SUSP: 150.0000 mg | Freq: Once | INTRAMUSCULAR | Status: DC

## 2017-10-05 MED FILL — ?FOLIC ACID 1 MG TABLET: 1 | 30 days supply | Qty: 30 | Fill #0

## 2017-10-05 MED FILL — ESCITALOPRAM 10 MG TABLET: 10 | 30 days supply | Qty: 30 | Fill #0

## 2017-10-05 MED FILL — POTASSIUM CL ER 10 MEQ TAB: 10 | 30 days supply | Qty: 30 | Fill #0

## 2017-10-05 MED FILL — ?HYDROCHLOROTHIAZIDE 12.5MG: 12.5 | 30 days supply | Qty: 30 | Fill #0

## 2017-10-05 MED FILL — AMLODIPINE BESYLATE 5 MG TA: 5 | 30 days supply | Qty: 30 | Fill #0

## 2017-10-05 NOTE — Progress Notes (Signed)
Patient ID: Elizabeth Bush, female    DOB: 03-21-1971  MRN: [id number]  CC: Hypertension   Subjective: Elizabeth Bush is a 47 y.o. female who presents for chronic ds management Her concerns today include:  Hx of ETOH abuse, HTN, severe hemorrhoids,fibroids,iron def anemia, OA knees. .  Anemia:  hosp at [facility] 06/2017 for severe anemia.  Rec 4 units PRBC.  Start on Depo shot. Reports bleeding less since Depo.  Last menses 07/26/2017 and lasted for several days.  Next shot was due to 08/2017 but she did not go back because she owes $40-60.  She has had a tubal ligation in the past -OC expires today.  She said she applied for Medicaid and it is pending -Needs refill on iron.  HTN:  Out of HCTZ and Norvasc for a while due to [place].  She will try get financial help from her friend today to get her medications   ETOH:  "I'm still drinking"  From 1-2 40 oz beer a day. Neighbors give it to her in exchange for food.   Depression/Anxiety:  "All I do is cry and drink." Out of Lexapro. Requesting refill.  OA knees: Requests refill on meloxicam. Referred to also in the past but she did not have the [place] card at the time. Now her Orange card expires today. She is hoping that she will get Medicaid soon at which time we can refer to orthopedics Patient Active Problem List   Diagnosis Date Noted  . Diabetes mellitus screening 11/05/2016  . Alcohol abuse 11/05/2016  . Essential hypertension 11/05/2016  . Grade III hemorrhoids 11/05/2016  . DUB (dysfunctional uterine bleeding) 09/21/2013  . Anemia 09/21/2013  . GIB (gastrointestinal bleeding) 05/29/2011  . Anemia associated with acute blood loss 05/29/2011     No current outpatient medications on file prior to visit.   No current facility-administered medications on file prior to visit.     Allergies  Allergen Reactions  . Lisinopril Swelling    Swelling of mouth/lips    Social History   Socioeconomic History  . Marital  status: Single    Spouse name: Not on file  . Number of children: Not on file  . Years of education: Not on file  . Highest education level: Not on file  Occupational History  . Not on file  Social Needs  . Financial resource strain: Not on file  . Food insecurity:    Worry: Not on file    Inability: Not on file  . Transportation needs:    Medical: Not on file    Non-medical: Not on file  Tobacco Use  . Smoking status: Former Smoker    Packs/day: 0.25    Years: 5.00    Pack years: 1.25    Types: Cigarettes    Last attempt to quit: 11/04/2016    Years since quitting: 0.9  . Smokeless tobacco: Never Used  Substance and Sexual Activity  . Alcohol use: Yes    Alcohol/week: 3.6 oz    Types: 6 Cans of beer per week    Comment: 6 40 oz beers/day  . Drug use: No  . Sexual activity: Yes    Birth control/protection: Other-see comments, Surgical  Lifestyle  . Physical activity:    Days per week: Not on file    Minutes per session: Not on file  . Stress: Not on file  Relationships  . Social connections:    Talks on phone: Not on file  Gets together: Not on file    Attends religious service: Not on file    Active member of club or organization: Not on file    Attends meetings of clubs or organizations: Not on file    Relationship status: Not on file  . Intimate partner violence:    Fear of current or ex partner: Not on file    Emotionally abused: Not on file    Physically abused: Not on file    Forced sexual activity: Not on file  Other Topics Concern  . Not on file  Social History Narrative  . Not on file    Family History  Problem Relation Age of Onset  . Diabetes Mother   . Hypertension Other   . Asthma Other     Past Surgical History:  Procedure Laterality Date  . COLONOSCOPY  05/31/2011   Procedure: COLONOSCOPY;  Surgeon: Landry Dyke, MD;  Location: [facility];  Service: Endoscopy;  Laterality: N/A;  . ESOPHAGOGASTRODUODENOSCOPY  05/30/2011    Procedure: ESOPHAGOGASTRODUODENOSCOPY (EGD);  Surgeon: Landry Dyke, MD;  Location: [facility];  Service: Endoscopy;  Laterality: N/A;  . GIVENS CAPSULE STUDY  06/01/2011   Procedure: GIVENS CAPSULE STUDY;  Surgeon: Landry Dyke, MD;  Location: [facility];  Service: Endoscopy;  Laterality: N/A;  . TONSILLECTOMY    . TUBAL LIGATION      ROS: Review of Systems Negative except as stated above PHYSICAL EXAM: BP (!) 154/81   Pulse 91   Temp 98.6 F (37 C) (Oral)   Resp 16   Wt 199 lb 12.8 oz (90.6 kg)   SpO2 96%   BMI 32.25 kg/m   Physical Exam  General appearance - alert, well appearing, and in no distress Mental status - normal mood, behavior, speech, dress, motor activity, and thought processes Neck - supple, no significant adenopathy Chest - clear to auscultation, no wheezes, rales or rhonchi, symmetric air entry Heart - normal rate, regular rhythm, normal S1, S2, no murmurs, rubs, clicks or gallops Musculoskeletal - knees: slight bowlegged. Positive bilateral joint enlargement. Moderate discomfort with passive range of motion. Extremities - peripheral pulses normal, no pedal edema, no clubbing or cyanosis  ASSESSMENT AND PLAN: 1. Iron deficiency anemia due to chronic blood loss - ferrous sulfate 300 (60 Fe) MG/5ML syrup; Take 5 mLs (300 mg total) by mouth 2 (two) times daily with a meal.  Dispense: 150 mL; Refill: 3  2. Essential hypertension Uncontrolled. Needs refill on medications. - amLODipine (NORVASC) 5 MG tablet; Take 1 tablet (5 mg total) by mouth daily.  Dispense: 90 tablet; Refill: 3 - hydrochlorothiazide (HYDRODIURIL) 12.5 MG tablet; Take 1 tablet (12.5 mg total) by mouth daily.  Dispense: 30 tablet; Refill: 3 - potassium chloride (K-DUR) 10 MEQ tablet; Take 1 tablet (10 mEq total) by mouth daily.  Dispense: 30 tablet; Refill: 3  3. Primary osteoarthritis of both knees She will let me know once she is approved for Medicaid or has reapplied for the [facility]  card so that we can refer to orthopedics - meloxicam (MOBIC) 15 MG tablet; Take 1 tablet (15 mg total) by mouth daily.  Dispense: 30 tablet; Refill: 1  4. H/O menorrhagia - medroxyPROGESTERone (DEPO-PROVERA) injection 150 mg  5. Anxiety and depression - escitalopram (LEXAPRO) 10 MG tablet; Take 1 tablet (10 mg total) by mouth daily.  Dispense: 30 tablet; Refill: 3   Patient was given the opportunity to ask questions.  Patient verbalized understanding of the plan and was able  to repeat key elements of the plan.   No orders of the defined types were placed in this encounter.    Requested Prescriptions   Signed Prescriptions Disp Refills  . meloxicam (MOBIC) 15 MG tablet 30 tablet 1    Sig: Take 1 tablet (15 mg total) by mouth daily.  . ferrous sulfate 300 (60 Fe) MG/5ML syrup 150 mL 3    Sig: Take 5 mLs (300 mg total) by mouth 2 (two) times daily with a meal.  . amLODipine (NORVASC) 5 MG tablet 90 tablet 3    Sig: Take 1 tablet (5 mg total) by mouth daily.  . hydrochlorothiazide (HYDRODIURIL) 12.5 MG tablet 30 tablet 3    Sig: Take 1 tablet (12.5 mg total) by mouth daily.  Marland Kitchen escitalopram (LEXAPRO) 10 MG tablet 30 tablet 3    Sig: Take 1 tablet (10 mg total) by mouth daily.  . potassium chloride (K-DUR) 10 MEQ tablet 30 tablet 3    Sig: Take 1 tablet (10 mEq total) by mouth daily.  . folic acid (FOLVITE) 1 MG tablet 100 tablet 1    Sig: Take 1 tablet (1 mg total) by mouth daily.    Return in about 2 months (around 12/05/2017).  Karle Plumber, MD, FACP

## 2017-10-20 ENCOUNTER — Ambulatory Visit: Payer: No Typology Code available for payment source

## 2017-11-02 ENCOUNTER — Ambulatory Visit: Payer: No Typology Code available for payment source

## 2017-11-04 MED FILL — ESCITALOPRAM 10 MG TABLET: 10 | 30 days supply | Qty: 30 | Fill #1

## 2017-11-04 MED FILL — MELOXICAM 15 MG TABLET: 15 | 30 days supply | Qty: 30 | Fill #0

## 2017-11-22 ENCOUNTER — Ambulatory Visit: Payer: Self-pay | Attending: Internal Medicine | Admitting: Internal Medicine

## 2017-11-22 ENCOUNTER — Encounter: Payer: Self-pay | Admitting: Internal Medicine

## 2017-11-22 VITALS — BP 140/93 | HR 93 | Temp 98.4°F | Resp 18 | Ht 67.0 in | Wt 198.0 lb

## 2017-11-22 DIAGNOSIS — M170 Bilateral primary osteoarthritis of knee: Secondary | ICD-10-CM | POA: Insufficient documentation

## 2017-11-22 DIAGNOSIS — Z09 Encounter for follow-up examination after completed treatment for conditions other than malignant neoplasm: Secondary | ICD-10-CM | POA: Insufficient documentation

## 2017-11-22 DIAGNOSIS — F329 Major depressive disorder, single episode, unspecified: Secondary | ICD-10-CM | POA: Insufficient documentation

## 2017-11-22 DIAGNOSIS — Z87891 Personal history of nicotine dependence: Secondary | ICD-10-CM | POA: Insufficient documentation

## 2017-11-22 DIAGNOSIS — N920 Excessive and frequent menstruation with regular cycle: Secondary | ICD-10-CM | POA: Insufficient documentation

## 2017-11-22 DIAGNOSIS — F419 Anxiety disorder, unspecified: Secondary | ICD-10-CM | POA: Insufficient documentation

## 2017-11-22 DIAGNOSIS — F32A Depression, unspecified: Secondary | ICD-10-CM

## 2017-11-22 DIAGNOSIS — D259 Leiomyoma of uterus, unspecified: Secondary | ICD-10-CM | POA: Insufficient documentation

## 2017-11-22 DIAGNOSIS — I10 Essential (primary) hypertension: Secondary | ICD-10-CM | POA: Insufficient documentation

## 2017-11-22 DIAGNOSIS — Z79899 Other long term (current) drug therapy: Secondary | ICD-10-CM | POA: Insufficient documentation

## 2017-11-22 DIAGNOSIS — D500 Iron deficiency anemia secondary to blood loss (chronic): Secondary | ICD-10-CM | POA: Insufficient documentation

## 2017-11-22 DIAGNOSIS — F1010 Alcohol abuse, uncomplicated: Secondary | ICD-10-CM | POA: Insufficient documentation

## 2017-11-22 MED ORDER — OMEPRAZOLE 20 MG PO CPDR: 20.0000 mg | DELAYED_RELEASE_CAPSULE | Freq: Every day | ORAL | 3 refills | Status: DC

## 2017-11-22 MED ORDER — FERROUS SULFATE 325 (65 FE) MG PO TABS: 325.0000 mg | ORAL_TABLET | Freq: Two times a day (BID) | ORAL | 3 refills | Status: DC

## 2017-11-22 MED ORDER — DULOXETINE HCL 30 MG PO CPEP: 30.0000 mg | ORAL_CAPSULE | Freq: Every day | ORAL | 3 refills | Status: DC

## 2017-11-22 MED FILL — FERROUS SULFATE 325 MG TAB: 325 (65 FE) | 30 days supply | Qty: 60 | Fill #0

## 2017-11-22 MED FILL — DULoxetine HCL 30 MG CPEP: 30 | 30 days supply | Qty: 30 | Fill #0

## 2017-11-22 NOTE — Progress Notes (Signed)
Patient ID: Elizabeth Bush, female    DOB: 04/05/1971  MRN: [id number]  CC: Follow-up   Subjective: Elizabeth Bush is a 47 y.o. female who presents for 2 mths f/u for chronic ds management Her concerns today include: Hx of ETOH abuse, HTN, severe hemorrhoids,fibroids,iron def anemia, OA knees. .  Did not bring meds with her and she does not recall what she is taking.  Anemia/Iron/menrrhagia:  She is not sure if she has iron.  Not able to afford the liq iron at outside pharmacy.  Willing to try the iron pills again. -did not get the Depo shot on last visit; had to leave because ride was ready to go -"I've been bleeding. Big clots."  Endorses dizziness but not sure if it is due to bleeding or chronic drinking and taking meds when she drinks   Staying in motel for pass 3 days because roof fell in her apartment. Limited finances and depends on others for transportation. Very depress.  Taking Lexapro.   HTN:  States she is taking her 2 BP meds but did not take one as yet for today  OA:  Saw a disability doctor recently for physical.  No Medicaid as yet.  Has not reapplied for OC as yet.  Filling out form today and wants to know if she can be seen by the financial specialist while here today.  Not sure that she would get back here otherwise due to limited transportation -taking Meloxicam for pain.  Feels she needs something stronger. Patient Active Problem List   Diagnosis Date Noted  . Diabetes mellitus screening 11/05/2016  . Alcohol abuse 11/05/2016  . Essential hypertension 11/05/2016  . Grade III hemorrhoids 11/05/2016  . DUB (dysfunctional uterine bleeding) 09/21/2013  . Anemia 09/21/2013  . GIB (gastrointestinal bleeding) 05/29/2011  . Anemia associated with acute blood loss 05/29/2011     Current Outpatient Medications on File Prior to Visit  Medication Sig Dispense Refill  . amLODipine (NORVASC) 5 MG tablet Take 1 tablet (5 mg total) by mouth daily. 90 tablet 3  . folic acid  (FOLVITE) 1 MG tablet Take 1 tablet (1 mg total) by mouth daily. 100 tablet 1  . hydrochlorothiazide (HYDRODIURIL) 12.5 MG tablet Take 1 tablet (12.5 mg total) by mouth daily. 30 tablet 3  . meloxicam (MOBIC) 15 MG tablet Take 1 tablet (15 mg total) by mouth daily. 30 tablet 1  . potassium chloride (K-DUR) 10 MEQ tablet Take 1 tablet (10 mEq total) by mouth daily. 30 tablet 3   No current facility-administered medications on file prior to visit.     Allergies  Allergen Reactions  . Lisinopril Swelling    Swelling of mouth/lips    Social History   Socioeconomic History  . Marital status: Single    Spouse name: Not on file  . Number of children: Not on file  . Years of education: Not on file  . Highest education level: Not on file  Occupational History  . Not on file  Social Needs  . Financial resource strain: Not on file  . Food insecurity:    Worry: Not on file    Inability: Not on file  . Transportation needs:    Medical: Not on file    Non-medical: Not on file  Tobacco Use  . Smoking status: Former Smoker    Packs/day: 0.25    Years: 5.00    Pack years: 1.25    Types: Cigarettes    Last attempt to  quit: 11/04/2016    Years since quitting: 1.0  . Smokeless tobacco: Never Used  Substance and Sexual Activity  . Alcohol use: Yes    Alcohol/week: 3.6 oz    Types: 6 Cans of beer per week    Comment: 6 40 oz beers/day  . Drug use: No  . Sexual activity: Yes    Birth control/protection: Other-see comments, Surgical  Lifestyle  . Physical activity:    Days per week: Not on file    Minutes per session: Not on file  . Stress: Not on file  Relationships  . Social connections:    Talks on phone: Not on file    Gets together: Not on file    Attends religious service: Not on file    Active member of club or organization: Not on file    Attends meetings of clubs or organizations: Not on file    Relationship status: Not on file  . Intimate partner violence:    Fear of  current or ex partner: Not on file    Emotionally abused: Not on file    Physically abused: Not on file    Forced sexual activity: Not on file  Other Topics Concern  . Not on file  Social History Narrative  . Not on file    Family History  Problem Relation Age of Onset  . Diabetes Mother   . Hypertension Other   . Asthma Other     Past Surgical History:  Procedure Laterality Date  . COLONOSCOPY  05/31/2011   Procedure: COLONOSCOPY;  Surgeon: Landry Dyke, MD;  Location: [facility];  Service: Endoscopy;  Laterality: N/A;  . ESOPHAGOGASTRODUODENOSCOPY  05/30/2011   Procedure: ESOPHAGOGASTRODUODENOSCOPY (EGD);  Surgeon: Landry Dyke, MD;  Location: [facility];  Service: Endoscopy;  Laterality: N/A;  . GIVENS CAPSULE STUDY  06/01/2011   Procedure: GIVENS CAPSULE STUDY;  Surgeon: Landry Dyke, MD;  Location: [facility];  Service: Endoscopy;  Laterality: N/A;  . TONSILLECTOMY    . TUBAL LIGATION      ROS: Review of Systems Neg except as above PHYSICAL EXAM: BP (!) 140/93 (BP Location: Left Arm, Patient Position: Sitting, Cuff Size: Normal)   Pulse 93   Temp 98.4 F (36.9 C) (Oral)   Resp 18   Ht 5\' 7"  (1.702 m)   Wt 198 lb (89.8 kg)   SpO2 97%   BMI 31.01 kg/m   Physical Exam  General appearance - alert, well appearing, and in no distress Mental status - pt tearful Eyes - slightly pale conjunctiva Chest - clear to auscultation, no wheezes, rales or rhonchi, symmetric air entry Heart - normal rate, regular rhythm, normal S1, S2, no murmurs, rubs, clicks or gallops Extremities - no LE edema MSK: varus deformity of knees.  + jt enlargement  ASSESSMENT AND PLAN: 1. Anxiety and depression Stop Lexapro.  Change to Cymbalta which will help with pain from OA knees also Message sent to LCSW to f/u with pt - DULoxetine (CYMBALTA) 30 MG capsule; Take 1 capsule (30 mg total) by mouth daily.  Dispense: 30 capsule; Refill: 3  2. Essential hypertension Not at  goal Cont current meds  3. Iron deficiency anemia due to chronic blood loss - ferrous sulfate 325 (65 FE) MG tablet; Take 1 tablet (325 mg total) by mouth 2 (two) times daily with a meal.  Dispense: 120 tablet; Refill: 3 - CBC  4. Primary osteoarthritis of both knees Continue Mobic.  Add Cymbalta. Add Omeprazole for  GI protection given ETOH use on Mobic Once she has renewed OC, we can refer to ortho - DULoxetine (CYMBALTA) 30 MG capsule; Take 1 capsule (30 mg total) by mouth daily.  Dispense: 30 capsule; Refill: 3  5. Menorrhagia with regular cycle We are out of Depo shot.  Pt reschedule for nurse only visit for depo shot   Patient was given the opportunity to ask questions.  Patient verbalized understanding of the plan and was able to repeat key elements of the plan.   Orders Placed This Encounter  Procedures  . CBC     Requested Prescriptions   Signed Prescriptions Disp Refills  . ferrous sulfate 325 (65 FE) MG tablet 120 tablet 3    Sig: Take 1 tablet (325 mg total) by mouth 2 (two) times daily with a meal.  . DULoxetine (CYMBALTA) 30 MG capsule 30 capsule 3    Sig: Take 1 capsule (30 mg total) by mouth daily.    Return in about 2 months (around 01/23/2018).  Karle Plumber, MD, FACP

## 2017-11-22 NOTE — Patient Instructions (Signed)
Stop Lexapro.  This is the medication for depression/anxiety.  Start Cymbalta 30 mg instead.

## 2017-11-23 LAB — CBC
Hematocrit: 30.5 % — ABNORMAL LOW (ref 34.0–46.6)
Hemoglobin: 9.7 g/dL — ABNORMAL LOW (ref 11.1–15.9)
MCH: 27.5 pg (ref 26.6–33.0)
MCHC: 31.8 g/dL (ref 31.5–35.7)
MCV: 86 fL (ref 79–97)
Platelets: 302 10*3/uL (ref 150–450)
RBC: 3.53 x10E6/uL — ABNORMAL LOW (ref 3.77–5.28)
RDW: 15 % (ref 12.3–15.4)
WBC: 6.7 10*3/uL (ref 3.4–10.8)

## 2017-11-23 MED FILL — ?OMEPRazole 20mg CPDR: 20 | 30 days supply | Qty: 30 | Fill #0

## 2017-11-26 ENCOUNTER — Ambulatory Visit: Payer: Self-pay | Attending: Internal Medicine

## 2017-11-26 ENCOUNTER — Ambulatory Visit (HOSPITAL_BASED_OUTPATIENT_CLINIC_OR_DEPARTMENT_OTHER): Payer: Self-pay | Admitting: *Deleted

## 2017-11-26 ENCOUNTER — Ambulatory Visit: Payer: Self-pay | Admitting: Licensed Clinical Social Worker

## 2017-11-26 ENCOUNTER — Telehealth: Payer: Self-pay | Admitting: Internal Medicine

## 2017-11-26 DIAGNOSIS — Z3042 Encounter for surveillance of injectable contraceptive: Secondary | ICD-10-CM | POA: Insufficient documentation

## 2017-11-26 DIAGNOSIS — F1010 Alcohol abuse, uncomplicated: Secondary | ICD-10-CM

## 2017-11-26 DIAGNOSIS — F419 Anxiety disorder, unspecified: Secondary | ICD-10-CM

## 2017-11-26 DIAGNOSIS — F331 Major depressive disorder, recurrent, moderate: Secondary | ICD-10-CM

## 2017-11-26 LAB — POCT URINE PREGNANCY: Preg Test, Ur: NEGATIVE

## 2017-11-26 MED ORDER — MEDROXYPROGESTERONE ACETATE 150 MG/ML IM SUSP
150.0000 mg | Freq: Once | INTRAMUSCULAR | Status: AC
  Administered 2017-11-26: 150 mg via INTRAMUSCULAR

## 2017-11-26 NOTE — Progress Notes (Signed)
Pt is in the office today for a depo injection

## 2017-11-26 NOTE — Patient Instructions (Signed)
Please schedule next depo between September 27-October 11  Medroxyprogesterone injection [Contraceptive] What is this medicine? MEDROXYPROGESTERONE (me DROX ee proe JES te rone) contraceptive injections prevent pregnancy. They provide effective birth control for 3 months. Depo-subQ Provera 104 is also used for treating pain related to endometriosis. This medicine may be used for other purposes; ask your health care provider or pharmacist if you have questions. COMMON BRAND NAME(S): Depo-Provera, Depo-subQ Provera 104 What should I tell my health care provider before I take this medicine? They need to know if you have any of these conditions: -frequently drink alcohol -asthma -blood vessel disease or a history of a blood clot in the lungs or legs -bone disease such as osteoporosis -breast cancer -diabetes -eating disorder (anorexia nervosa or bulimia) -high blood pressure -HIV infection or AIDS -kidney disease -liver disease -mental depression -migraine -seizures (convulsions) -stroke -tobacco smoker -vaginal bleeding -an unusual or allergic reaction to medroxyprogesterone, other hormones, medicines, foods, dyes, or preservatives -pregnant or trying to get pregnant -breast-feeding How should I use this medicine? Depo-Provera Contraceptive injection is given into a muscle. Depo-subQ Provera 104 injection is given under the skin. These injections are given by a health care professional. You must not be pregnant before getting an injection. The injection is usually given during the first 5 days after the start of a menstrual period or 6 weeks after delivery of a baby. Talk to your pediatrician regarding the use of this medicine in children. Special care may be needed. These injections have been used in female children who have started having menstrual periods. Overdosage: If you think you have taken too much of this medicine contact a poison control center or emergency room at  once. NOTE: This medicine is only for you. Do not share this medicine with others. What if I miss a dose? Try not to miss a dose. You must get an injection once every 3 months to maintain birth control. If you cannot keep an appointment, call and reschedule it. If you wait longer than 13 weeks between Depo-Provera contraceptive injections or longer than 14 weeks between Depo-subQ Provera 104 injections, you could get pregnant. Use another method for birth control if you miss your appointment. You may also need a pregnancy test before receiving another injection. What may interact with this medicine? Do not take this medicine with any of the following medications: -bosentan This medicine may also interact with the following medications: -aminoglutethimide -antibiotics or medicines for infections, especially rifampin, rifabutin, rifapentine, and griseofulvin -aprepitant -barbiturate medicines such as phenobarbital or primidone -bexarotene -carbamazepine -medicines for seizures like ethotoin, felbamate, oxcarbazepine, phenytoin, topiramate -modafinil -St. John's wort This list may not describe all possible interactions. Give your health care provider a list of all the medicines, herbs, non-prescription drugs, or dietary supplements you use. Also tell them if you smoke, drink alcohol, or use illegal drugs. Some items may interact with your medicine. What should I watch for while using this medicine? This drug does not protect you against HIV infection (AIDS) or other sexually transmitted diseases. Use of this product may cause you to lose calcium from your bones. Loss of calcium may cause weak bones (osteoporosis). Only use this product for more than 2 years if other forms of birth control are not right for you. The longer you use this product for birth control the more likely you will be at risk for weak bones. Ask your health care professional how you can keep strong bones. You may have a change  in  bleeding pattern or irregular periods. Many females stop having periods while taking this drug. If you have received your injections on time, your chance of being pregnant is very low. If you think you may be pregnant, see your health care professional as soon as possible. Tell your health care professional if you want to get pregnant within the next year. The effect of this medicine may last a long time after you get your last injection. What side effects may I notice from receiving this medicine? Side effects that you should report to your doctor or health care professional as soon as possible: -allergic reactions like skin rash, itching or hives, swelling of the face, lips, or tongue -breast tenderness or discharge -breathing problems -changes in vision -depression -feeling faint or lightheaded, falls -fever -pain in the abdomen, chest, groin, or leg -problems with balance, talking, walking -unusually weak or tired -yellowing of the eyes or skin Side effects that usually do not require medical attention (report to your doctor or health care professional if they continue or are bothersome): -acne -fluid retention and swelling -headache -irregular periods, spotting, or absent periods -temporary pain, itching, or skin reaction at site where injected -weight gain This list may not describe all possible side effects. Call your doctor for medical advice about side effects. You may report side effects to FDA at 1-800-FDA-1088. Where should I keep my medicine? This does not apply. The injection will be given to you by a health care professional. NOTE: This sheet is a summary. It may not cover all possible information. If you have questions about this medicine, talk to your doctor, pharmacist, or health care provider.  2018 Elsevier/Gold Standard (2008-05-25 18:37:56)

## 2017-11-26 NOTE — Telephone Encounter (Signed)
Will forward to pcp

## 2017-11-26 NOTE — BH Specialist Note (Signed)
Integrated Behavioral Health Initial Visit  MRN: [id number] Name: Elizabeth Bush  Number of [place] Clinician visits:: 1/6 Session Start time: 4:10 PM  Session End time: 4:25 PM Total time: 15 minutes  Type of Service: [place] Interpretor:No. Interpretor Name and Language: N/A   Warm Hand Off Completed.       SUBJECTIVE: Elizabeth Bush is a 47 y.o. female accompanied by self Patient was referred by Dr. Wynetta Emery for depression, anxiety, and substance use. Patient reports the following symptoms/concerns: decreased pleasure in doing things, feelings of sadness and worry, difficulty sleeping, low energy/motivation, decreased concentration, and irritability Duration of problem: Ongoing; Severity of problem: severe  OBJECTIVE: Mood: Anxious and Irritable and Affect: Appropriate Risk of harm to self or others: No plan to harm self or others  LIFE CONTEXT: Family and Social: Pt receives limited support from family School/Work: Pt is unemployed and uninsured. She has completed appointment with Financial Counseling today to apply for [facility] Card Self-Care: Pt engages in substance use (alcohol) on a daily basis.  Life Changes: Pt reports an increase in stress triggered by financial strain, transportation barriers, and the immediate need for repairs in the home.   GOALS ADDRESSED: Patient will: 1. Reduce symptoms of: anxiety, depression and stress 2. Increase knowledge and/or ability of: coping skills and stress reduction  3. Demonstrate ability to: Increase healthy adjustment to current life circumstances and Increase adequate support systems for patient/family  INTERVENTIONS: Interventions utilized: Solution-Focused Strategies and Link to [employer]  Standardized Assessments completed: GAD-7 and PHQ 2&9  ASSESSMENT: Patient currently experiencing an increase in stress triggered by financial strain, transportation  barriers, and the immediate need for repairs in the home. She reports decreased pleasure in doing things, feelings of sadness and worry, difficulty sleeping, low energy/motivation, decreased concentration, and irritability. Pt denies SI/HI/AVH. Reports ongoing substance use and states that she does not plan on decreasing use.   Patient may benefit from psychotherapy. She is participating in medication management through PCP. LCSWA discussed grounding strategies to promote relaxation and decrease symptoms. She was commended on following through with Financial Counseling appointment and was provided transportation resources.   PLAN: 1. Follow up with behavioral health clinician on : Pt was encouraged to contact [place] if symptoms worsen or fail to improve to schedule behavioral appointments at [facility]. 2. Behavioral recommendations: LCSWA recommends that pt apply healthy coping skills, comply with medication management, decrease substance use, and utilize resources 3. Referral(s): [place] [facility]In Clinic) and [employer] Resources:  Transportation 4. "From scale of 1-10, how likely are you to follow plan?":   Rebekah Chesterfield, LCSW 11/29/17 2:23 PM

## 2017-11-26 NOTE — Telephone Encounter (Signed)
Pt came by to drop off disability paperwork, please give her a call when its ready for pickup

## 2018-01-24 ENCOUNTER — Ambulatory Visit: Payer: No Typology Code available for payment source | Admitting: Internal Medicine

## 2018-01-28 ENCOUNTER — Ambulatory Visit: Payer: Self-pay | Admitting: Internal Medicine

## 2018-02-15 ENCOUNTER — Telehealth: Payer: Self-pay | Admitting: Internal Medicine

## 2018-02-15 NOTE — Telephone Encounter (Signed)
Patient called because she would like to speak to Dr.Johnson about her health conditions. She is wanting help with disability. Please follow up with patient.

## 2018-02-16 NOTE — Telephone Encounter (Signed)
Will forward to pcp

## 2018-02-17 ENCOUNTER — Ambulatory Visit: Payer: Self-pay

## 2018-02-22 NOTE — Telephone Encounter (Signed)
Elizabeth Bush could you please schedule pt an appointment

## 2019-09-12 ENCOUNTER — Encounter: Payer: Self-pay | Admitting: Gastroenterology

## 2019-09-12 ENCOUNTER — Ambulatory Visit: Payer: Medicaid Other | Attending: Internal Medicine | Admitting: Internal Medicine

## 2019-09-12 ENCOUNTER — Other Ambulatory Visit: Payer: Self-pay

## 2019-09-12 DIAGNOSIS — M170 Bilateral primary osteoarthritis of knee: Secondary | ICD-10-CM | POA: Diagnosis not present

## 2019-09-12 DIAGNOSIS — Z131 Encounter for screening for diabetes mellitus: Secondary | ICD-10-CM

## 2019-09-12 DIAGNOSIS — Z1231 Encounter for screening mammogram for malignant neoplasm of breast: Secondary | ICD-10-CM

## 2019-09-12 DIAGNOSIS — I10 Essential (primary) hypertension: Secondary | ICD-10-CM | POA: Diagnosis not present

## 2019-09-12 DIAGNOSIS — K625 Hemorrhage of anus and rectum: Secondary | ICD-10-CM

## 2019-09-12 DIAGNOSIS — F1010 Alcohol abuse, uncomplicated: Secondary | ICD-10-CM

## 2019-09-12 DIAGNOSIS — N938 Other specified abnormal uterine and vaginal bleeding: Secondary | ICD-10-CM | POA: Diagnosis not present

## 2019-09-12 DIAGNOSIS — D500 Iron deficiency anemia secondary to blood loss (chronic): Secondary | ICD-10-CM

## 2019-09-12 MED ORDER — OMEPRAZOLE 20 MG PO CPDR: 20.0000 mg | DELAYED_RELEASE_CAPSULE | Freq: Every day | ORAL | 3 refills | Status: DC

## 2019-09-12 MED ORDER — CELECOXIB 200 MG PO CAPS: 200.0000 mg | ORAL_CAPSULE | Freq: Every day | ORAL | 3 refills | Status: DC

## 2019-09-12 MED ORDER — AMLODIPINE BESYLATE 5 MG PO TABS: 5.0000 mg | ORAL_TABLET | Freq: Every day | ORAL | 3 refills | Status: DC

## 2019-09-12 MED ORDER — FERROUS SULFATE 325 (65 FE) MG PO TABS: 325.0000 mg | ORAL_TABLET | Freq: Every day | ORAL | 3 refills | Status: DC

## 2019-09-12 MED ORDER — DULOXETINE HCL 20 MG PO CPEP
20.0000 mg | ORAL_CAPSULE | Freq: Every day | ORAL | 3 refills | Status: DC
Start: 2019-09-12 — End: 2019-10-31

## 2019-09-12 MED FILL — DULoxetine HCL 20 MG CPEP: 20 | 30 days supply | Qty: 30 | Fill #0

## 2019-09-12 MED FILL — AMLODIPINE BESYLATE 5 MG TA: 5 | 30 days supply | Qty: 30 | Fill #0

## 2019-09-12 MED FILL — OMEPRAZOLE 20 MG CAP: 20 | 30 days supply | Qty: 30 | Fill #0

## 2019-09-12 MED FILL — CELECOXIB 200 MG CAPSULE: 200 | 30 days supply | Qty: 30 | Fill #0

## 2019-09-12 NOTE — Progress Notes (Signed)
Pt states she has not taken any of her meds in over a year   Pt states she stop taking the medications because she didn't know what they were for

## 2019-09-12 NOTE — Progress Notes (Addendum)
Virtual Visit via Telephone Note Due to current restrictions/limitations of in-office visits due to the COVID-19 pandemic, this scheduled clinical appointment was converted to a telehealth visit  I connected with Elizabeth Bush on 09/12/19 at 9:19 a.m by telephone and verified that I am speaking with the correct person using two identifiers. I am in my office.  The patient is at home.  Only the patient and myself participated in this encounter.  I discussed the limitations, risks, security and privacy concerns of performing an evaluation and management service by telephone and the availability of in person appointments. I also discussed with the patient that there may be a patient responsible charge related to this service. The patient expressed understanding and agreed to proceed.   History of Present Illness: Hx of ETOH abuse, HTN, severe hemorrhoids,fibroids,iron def anemia, OA knees.  Last seen 11/2017.  Purpose of today's visit is chronic disease management.  OA Knees:  Reports legs are getting worse.  She has swelling in the knees and  can barely walk.  She has to crawl up the stairs in her house.  She has had some falls.  She is not on any medications and is requesting pain medication.  She tells me that she stopped going to doctors after she had seen the last orthopedic specialist when she was trying to get disability and was told that there is nothing wrong with her knees.  I am not sure which specialist she is referring to.  Last imaging studies done on her knees in 2018 reveal significant osteoarthritis.  Now has medicaid.  Would like to get a 4 prong cane.    HTN: When we last saw her she was on Norvasc and hydrochlorothiazide.  Off meds for over 1 yr.  No device to check BP.  "I love salt, I ain't gonna tell lie."  No CP, SOB. No swelling in lower legs.  +swelling in the knees.  Anemia:  Still gets bleeding but can not tell if bleeding is coming from vagina or rectum.  Having to wear  diapers.  Had colonoscopy in 2013 which revealed moderate internal and external hemorrhoids with sigmoid diverticulosis.  She also has history of uterine fibroids and had seen gynecology in the past.  She was placed on Depo shot but never followed up for subsequent shots.  She has had blood transfusions in the past due to severe anemia.  She has had problems tolerating oral iron.  ETOH abuse:  Still drinking two 40 oz a day. Home alone.  "It is a habit.  I need something to keep the taste out of my mouth."  Wants to quit.  HM:  Did not get COVID vaccine but plans too Outpatient Encounter Medications as of 09/12/2019  Medication Sig  . amLODipine (NORVASC) 5 MG tablet Take 1 tablet (5 mg total) by mouth daily. (Patient not taking: Reported on 09/12/2019)  . DULoxetine (CYMBALTA) 30 MG capsule Take 1 capsule (30 mg total) by mouth daily. (Patient not taking: Reported on 09/12/2019)  . ferrous sulfate 325 (65 FE) MG tablet Take 1 tablet (325 mg total) by mouth 2 (two) times daily with a meal. (Patient not taking: Reported on 09/12/2019)  . folic acid (FOLVITE) 1 MG tablet Take 1 tablet (1 mg total) by mouth daily. (Patient not taking: Reported on 09/12/2019)  . hydrochlorothiazide (HYDRODIURIL) 12.5 MG tablet Take 1 tablet (12.5 mg total) by mouth daily. (Patient not taking: Reported on 09/12/2019)  . meloxicam (MOBIC) 15 MG tablet  Take 1 tablet (15 mg total) by mouth daily. (Patient not taking: Reported on 09/12/2019)  . omeprazole (PRILOSEC) 20 MG capsule Take 1 capsule (20 mg total) by mouth daily. (Patient not taking: Reported on 09/12/2019)  . potassium chloride (K-DUR) 10 MEQ tablet Take 1 tablet (10 mEq total) by mouth daily. (Patient not taking: Reported on 09/12/2019)   No facility-administered encounter medications on file as of 09/12/2019.      Observations/Objective: Depression screen [facility] 2/9 09/12/2019 10/05/2017 06/04/2017  Decreased Interest - 2 3  Down, Depressed, Hopeless 2 3 3   PHQ - 2  Score 2 5 6   Altered sleeping 2 3 3   Tired, decreased energy 2 3 3   Change in appetite 3 3 2   Feeling bad or failure about yourself  2 3 3   Trouble concentrating 2 1 2   Moving slowly or fidgety/restless 1 (No Data) 3  Suicidal thoughts 0 0 1  PHQ-9 Score 14 18 23    GAD 7 : Generalized Anxiety Score 09/12/2019 10/05/2017 06/04/2017 05/14/2017  Nervous, Anxious, on Edge 2 3 3 3   Control/stop worrying 3 3 3 3   Worry too much - different things 3 3 3 3   Trouble relaxing 3 3 3 3   Restless 2 3 1  0  Easily annoyed or irritable 3 3 3 3   Afraid - awful might happen 3 3 3 3   Total GAD 7 Score 19 21 19 18     Assessment and Plan: 1. Primary osteoarthritis of both knees We will try to get her back in with orthopedics.  She is likely at the point where she needs knee replacement surgeries.  In the meantime we will start her on Celebrex along with a PPI.  We will also restart low-dose Cymbalta which helps with musculoskeletal pain.  She has history of depression and anxiety with positive gad and PHQ9 screening today She will pick up a handwritten prescription from me for a 4 pronged cane and Rollator walker. - Ambulatory referral to Orthopedic Surgery - celecoxib (CELEBREX) 200 MG capsule; Take 1 capsule (200 mg total) by mouth daily.  Dispense: 30 capsule; Refill: 3  2. Essential hypertension Level of control unknown but likely uncontrolled given that she has been off medications for over a year.  We will start her back on just amlodipine.  And can get her in on a future visit with the clinical pharmacist for blood pressure check - Comprehensive metabolic panel; Future - Lipid panel; Future - amLODipine (NORVASC) 5 MG tablet; Take 1 tablet (5 mg total) by mouth daily.  Dispense: 90 tablet; Refill: 3  3. Iron deficiency anemia due to chronic blood loss - CBC; Future - Ambulatory referral to Gynecology - ferrous sulfate 325 (65 FE) MG tablet; Take 1 tablet (325 mg total) by mouth daily with  breakfast.  Dispense: 100 tablet; Refill: 3  4. DUB (dysfunctional uterine bleeding) - Ambulatory referral to Gynecology  5. Rectal bleeding - Ambulatory referral to Gastroenterology  6. Alcohol abuse Strongly advised to quit.  Patient states that she desires to quit but needs help in doing that.  She would like to try a medication that can decrease her cravings.  We can put her on naltrexone but I would like to check her chemistry first.  7. Diabetes mellitus screening - Hemoglobin A1c; Future  8. Encounter for screening mammogram for malignant neoplasm of breast - MM Digital Screening; Future  Patient will be coming to the lab and the pharmacy this Friday at 9 AM.  At the same time we will have her see the clinical pharmacist to have blood pressure check.  Follow Up Instructions: Follow-up in 7 weeks in person   I discussed the assessment and treatment plan with the patient. The patient was provided an opportunity to ask questions and all were answered. The patient agreed with the plan and demonstrated an understanding of the instructions.   The patient was advised to call back or seek an in-person evaluation if the symptoms worsen or if the condition fails to improve as anticipated.  I provided 20 minutes of non-face-to-face time during this encounter.   Karle Plumber, MD

## 2019-09-15 ENCOUNTER — Other Ambulatory Visit: Payer: Self-pay

## 2019-09-15 ENCOUNTER — Encounter: Payer: Self-pay | Admitting: Pharmacist

## 2019-09-15 ENCOUNTER — Ambulatory Visit: Payer: Medicaid Other | Attending: Internal Medicine

## 2019-09-15 ENCOUNTER — Ambulatory Visit (HOSPITAL_BASED_OUTPATIENT_CLINIC_OR_DEPARTMENT_OTHER): Payer: Medicaid Other | Admitting: Pharmacist

## 2019-09-15 VITALS — BP 125/83 | HR 98

## 2019-09-15 DIAGNOSIS — D500 Iron deficiency anemia secondary to blood loss (chronic): Secondary | ICD-10-CM | POA: Diagnosis not present

## 2019-09-15 DIAGNOSIS — Z9111 Patient's noncompliance with dietary regimen: Secondary | ICD-10-CM | POA: Insufficient documentation

## 2019-09-15 DIAGNOSIS — Z833 Family history of diabetes mellitus: Secondary | ICD-10-CM | POA: Diagnosis not present

## 2019-09-15 DIAGNOSIS — I10 Essential (primary) hypertension: Secondary | ICD-10-CM | POA: Diagnosis not present

## 2019-09-15 DIAGNOSIS — F17200 Nicotine dependence, unspecified, uncomplicated: Secondary | ICD-10-CM | POA: Diagnosis not present

## 2019-09-15 DIAGNOSIS — Z79899 Other long term (current) drug therapy: Secondary | ICD-10-CM | POA: Diagnosis not present

## 2019-09-15 DIAGNOSIS — Z131 Encounter for screening for diabetes mellitus: Secondary | ICD-10-CM

## 2019-09-15 DIAGNOSIS — Z8249 Family history of ischemic heart disease and other diseases of the circulatory system: Secondary | ICD-10-CM | POA: Diagnosis not present

## 2019-09-15 NOTE — Progress Notes (Signed)
   S:    PCP: Dr. Wynetta Emery  Patient arrives in good spirits. Presents to the clinic for BP check.  Patient was referred and last seen by Primary Care Provider on 09/12/2019. That visit occurred via telephone. Patient was instructed to see me for BP check today.   Patient reports adherence with medications.  Current BP Medications include:  Amlodipine 5 mg daily  Dietary habits include: non-compliant with salt restriction; denies drinking caffeine  Exercise habits include: limited d/t knee pain Family / Social history:  - FHx: DM, HTN - Tobacco: current someday smoker - Alcohol: hx of heavy alcohol consumption (up to 6, 40 oz beers daily)  O:  Vitals:   09/15/19 0934  BP: 125/83  Pulse: 98    Home BP readings: none   Last 3 Office BP readings: BP Readings from Last 3 Encounters:  11/22/17 (!) 140/93  10/05/17 (!) 154/81  07/16/17 (!) 172/99    BMET    Component Value Date/Time   NA 137 04/02/2017 1233   K 3.7 04/02/2017 1233   CL 101 04/02/2017 1233   CO2 19 (L) 04/02/2017 1233   GLUCOSE 89 04/02/2017 1233   GLUCOSE 84 09/21/2013 0930   BUN 5 (L) 04/02/2017 1233   CREATININE 0.54 (L) 04/02/2017 1233   CALCIUM 9.0 04/02/2017 1233   GFRNONAA 113 04/02/2017 1233   GFRAA 130 04/02/2017 1233    Renal function: CrCl cannot be calculated (Patient's most recent lab result is older than the maximum 21 days allowed.).  Clinical ASCVD: No  The ASCVD Risk score Mikey Bussing DC Jr., et al., 2013) failed to calculate for the following reasons:   The systolic blood pressure is missing   Cannot find a previous HDL lab   Cannot find a previous total cholesterol lab   A/P: Hypertension longstanding currently at goal on current medications. BP Goal = <130/80 mmHg. Patient reports medication adherence. Strongly encouraged abstinence from alcohol while taking Cymbalta and Celebrex.   -Continued amlodipine 5 mg daily.  -Counseled on lifestyle modifications for blood pressure control  including reduced dietary sodium, increased exercise, adequate sleep  Results reviewed and written information provided.   Total time in face-to-face counseling 15 minutes.   F/U Clinic Visit in 2 weeks.   Benard Halsted, PharmD, [place] [phone]

## 2019-09-16 ENCOUNTER — Telehealth: Payer: Self-pay | Admitting: Internal Medicine

## 2019-09-16 NOTE — Telephone Encounter (Signed)
PC placed to pt today.  VMB on cell phone full.  I left VM on home phone informing her of who I am and that I was calling to go over results of labs.  Left message telling her that her blood count is very low and that she may need blood transfusion if she is dizzy or having weakness. If so she should be seen in ER.  I call again next wk.

## 2019-09-18 ENCOUNTER — Telehealth: Payer: Self-pay | Admitting: Internal Medicine

## 2019-09-18 LAB — COMPREHENSIVE METABOLIC PANEL
ALT: 29 IU/L (ref 0–32)
AST: 91 IU/L — ABNORMAL HIGH (ref 0–40)
Albumin/Globulin Ratio: 0.7 — ABNORMAL LOW (ref 1.2–2.2)
Albumin: 3.7 g/dL — ABNORMAL LOW (ref 3.8–4.8)
Alkaline Phosphatase: 105 IU/L (ref 39–117)
BUN/Creatinine Ratio: 10 (ref 9–23)
BUN: 6 mg/dL (ref 6–24)
Bilirubin Total: 0.3 mg/dL (ref 0.0–1.2)
CO2: 16 mmol/L — ABNORMAL LOW (ref 20–29)
Calcium: 9 mg/dL (ref 8.7–10.2)
Chloride: 100 mmol/L (ref 96–106)
Creatinine, Ser: 0.62 mg/dL (ref 0.57–1.00)
GFR calc Af Amer: 122 mL/min/{1.73_m2} (ref >59)
GFR calc non Af Amer: 105 mL/min/{1.73_m2} (ref >59)
Globulin, Total: 5.3 g/dL — ABNORMAL HIGH (ref 1.5–4.5)
Glucose: 92 mg/dL (ref 65–99)
Potassium: 4.5 mmol/L (ref 3.5–5.2)
Sodium: 132 mmol/L — ABNORMAL LOW (ref 134–144)
Total Protein: 9 g/dL — ABNORMAL HIGH (ref 6.0–8.5)

## 2019-09-18 LAB — CBC
Hematocrit: 23.3 % — ABNORMAL LOW (ref 34.0–46.6)
Hemoglobin: 6.7 g/dL — CL (ref 11.1–15.9)
MCH: 21.6 pg — ABNORMAL LOW (ref 26.6–33.0)
MCHC: 28.8 g/dL — ABNORMAL LOW (ref 31.5–35.7)
MCV: 75 fL — ABNORMAL LOW (ref 79–97)
Platelets: 365 10*3/uL (ref 150–450)
RBC: 3.1 x10E6/uL — ABNORMAL LOW (ref 3.77–5.28)
RDW: 18 % — ABNORMAL HIGH (ref 11.7–15.4)
WBC: 9.4 10*3/uL (ref 3.4–10.8)

## 2019-09-18 LAB — LIPID PANEL
Chol/HDL Ratio: 3.2 ratio (ref 0.0–4.4)
Cholesterol, Total: 189 mg/dL (ref 100–199)
HDL: 60 mg/dL (ref >39)
LDL Chol Calc (NIH): 112 mg/dL — ABNORMAL HIGH (ref 0–99)
Triglycerides: 97 mg/dL (ref 0–149)
VLDL Cholesterol Cal: 17 mg/dL (ref 5–40)

## 2019-09-18 LAB — HEMOGLOBIN A1C
Est. average glucose Bld gHb Est-mCnc: 97 mg/dL
Hgb A1c MFr Bld: 5 % (ref 4.8–5.6)

## 2019-09-18 NOTE — Telephone Encounter (Signed)
Phone call placed to patient this afternoon to review lab results.  I got her voice meal again.  I did not leave a message.  I will send her a letter. Results for orders placed or performed in visit on 09/15/19  Hemoglobin A1c  Result Value Ref Range   Hgb A1c MFr Bld 5.0 4.8 - 5.6 %   Est. average glucose Bld gHb Est-mCnc 97 mg/dL  Lipid panel  Result Value Ref Range   Cholesterol, Total 189 100 - 199 mg/dL   Triglycerides 97 0 - 149 mg/dL   HDL 60 >39 mg/dL   VLDL Cholesterol Cal 17 5 - 40 mg/dL   LDL Chol Calc (NIH) 112 (H) 0 - 99 mg/dL   Chol/HDL Ratio 3.2 0.0 - 4.4 ratio  Comprehensive metabolic panel  Result Value Ref Range   Glucose 92 65 - 99 mg/dL   BUN 6 6 - 24 mg/dL   Creatinine, Ser 0.62 0.57 - 1.00 mg/dL   GFR calc non Af Amer 105 >59 mL/min/1.73   GFR calc Af Amer 122 >59 mL/min/1.73   BUN/Creatinine Ratio 10 9 - 23   Sodium 132 (L) 134 - 144 mmol/L   Potassium 4.5 3.5 - 5.2 mmol/L   Chloride 100 96 - 106 mmol/L   CO2 16 (L) 20 - 29 mmol/L   Calcium 9.0 8.7 - 10.2 mg/dL   Total Protein 9.0 (H) 6.0 - 8.5 g/dL   Albumin 3.7 (L) 3.8 - 4.8 g/dL   Globulin, Total 5.3 (H) 1.5 - 4.5 g/dL   Albumin/Globulin Ratio 0.7 (L) 1.2 - 2.2   Bilirubin Total 0.3 0.0 - 1.2 mg/dL   Alkaline Phosphatase 105 39 - 117 IU/L   AST 91 (H) 0 - 40 IU/L   ALT 29 0 - 32 IU/L  CBC  Result Value Ref Range   WBC 9.4 3.4 - 10.8 x10E3/uL   RBC 3.10 (L) 3.77 - 5.28 x10E6/uL   Hemoglobin 6.7 (LL) 11.1 - 15.9 g/dL   Hematocrit 23.3 (L) 34.0 - 46.6 %   MCV 75 (L) 79 - 97 fL   MCH 21.6 (L) 26.6 - 33.0 pg   MCHC 28.8 (L) 31.5 - 35.7 g/dL   RDW 18.0 (H) 11.7 - 15.4 %   Platelets 365 150 - 450 x10E3/uL

## 2019-09-18 NOTE — Telephone Encounter (Signed)
Phone call placed to patient this morning to discuss severe anemia noted on lab results.  I left a message on her voicemail cell phone informing her of who I am and that I was calling to discuss her lab results.  Asked that she give [place] a call back.

## 2019-09-20 ENCOUNTER — Ambulatory Visit: Payer: Medicaid Other | Admitting: Orthopaedic Surgery

## 2019-09-22 ENCOUNTER — Ambulatory Visit: Payer: Medicaid Other | Admitting: Gastroenterology

## 2019-09-27 ENCOUNTER — Ambulatory Visit (INDEPENDENT_AMBULATORY_CARE_PROVIDER_SITE_OTHER): Payer: Medicaid Other | Admitting: Orthopaedic Surgery

## 2019-09-27 ENCOUNTER — Other Ambulatory Visit: Payer: Self-pay

## 2019-09-27 ENCOUNTER — Ambulatory Visit (INDEPENDENT_AMBULATORY_CARE_PROVIDER_SITE_OTHER): Payer: Medicaid Other

## 2019-09-27 ENCOUNTER — Ambulatory Visit: Payer: Self-pay

## 2019-09-27 DIAGNOSIS — M1711 Unilateral primary osteoarthritis, right knee: Secondary | ICD-10-CM | POA: Diagnosis not present

## 2019-09-27 DIAGNOSIS — M25561 Pain in right knee: Secondary | ICD-10-CM

## 2019-09-27 DIAGNOSIS — G8929 Other chronic pain: Secondary | ICD-10-CM

## 2019-09-27 DIAGNOSIS — M1712 Unilateral primary osteoarthritis, left knee: Secondary | ICD-10-CM

## 2019-09-27 DIAGNOSIS — M25562 Pain in left knee: Secondary | ICD-10-CM

## 2019-09-27 NOTE — Progress Notes (Signed)
Office Visit Note   Patient: Elizabeth Bush           Date of Birth: [birth year]           MRN: [id number] Visit Date: 09/27/2019              Requested by: Ladell Pier, MD [address] [place],  [place] [address] PCP: Ladell Pier, MD   Assessment & Plan: Visit Diagnoses:  1. Chronic pain of left knee   2. Chronic pain of right knee   3. Unilateral primary osteoarthritis, left knee   4. Unilateral primary osteoarthritis, right knee     Plan: Given the advanced arthritis of both knees and the severity of her knee pain and the severity of her osteoarthritis that has significantly worsened when comparing films over 3 years ago to now, the only treatment option at this point will be a knee replacement surgery.  She would like to proceed with this on the left knee first.  I did show her knee model and had a long and thorough discussion about knee replacement surgery.  She understands this is a difficult endeavor given her young age but I do feel the severity of arthritis in her pain combined with a large joint effusion and x-ray findings warrant the surgery at this point.  Injections and aspirations will help any more based on what were seeing on her x-rays and clinical exam.  We did have a long thorough discussion and all question concerns were answered addressed.  [facility] and we will see what we can do to help her get the financial assistance necessary to hopefully proceed with a left knee replacement in the near future.  Follow-Up Instructions: Return for 2 weeks post-op.   Orders:  Orders Placed This Encounter  Procedures  . XR Knee 1-2 Views Left  . XR Knee 1-2 Views Right   No orders of the defined types were placed in this encounter.     Procedures: No procedures performed   Clinical Data: No additional findings.   Subjective: Chief Complaint  Patient presents with  . Right Knee - Pain  . Left Knee - Pain    The patient is someone I am seeing for the first time.  [facility] to evaluate and treat bilateral knee osteoarthritis and severe pain.  She has had pain in both knees for years.  She has actually x-rays from 2018 of both knees and I am x-raying her today.  She ambulates with a cane.  She reports significant swelling in both knees.  She is only 49 years old.  She is not a diabetic.  She does report significant depression and high blood pressure and she is on medications for these things.  At this point her bilateral knee pain is daily and it is 10 out of 10.  It is definitely affecting her mobility, her quality of life and her actives of daily living significantly.  HPI  Review of Systems She currently denies any headache, chest pain, shortness of breath, fever, chills, nausea, vomiting  Objective: Vital Signs: There were no vitals taken for this visit.  Physical Exam She is alert and oriented x3 and in no acute distress but obvious discomfort.  She ambulates slowly and does use a cane. Ortho Exam Examination of both knees shows a moderate effusion with both knees.  She has severe medial  joint line tenderness with both knees as well as varus malalignment of both knees.  They are very painful and warm but not infected.  There is no redness.  Her calves are soft.  She does have pretty good range of motion of both knees but is limited by the severity of her pain. Specialty Comments:  No specialty comments available.  Imaging: XR Knee 1-2 Views Left  Result Date: 09/27/2019 2 views of the left knee show severe end-stage arthritis of the left knee.  There is complete loss of medial joint space with significant varus malalignment.  This is worsened since films compared to 2018.  There are osteophytes in all 3 compartments.  There is significant sclerotic changes in the medial area of the tibia showing stress changes from the severity of her  arthritis.  XR Knee 1-2 Views Right  Result Date: 09/27/2019 2 views of the right knee show severe end-stage arthritis with varus malalignment and complete loss of medial joint space.  There is severe patellofemoral disease and periarticular osteophytes in all 3 compartments.  This is significantly worsened when compared to films from 2018.    PMFS History: Patient Active Problem List   Diagnosis Date Noted  . Unilateral primary osteoarthritis, right knee 09/27/2019  . Diabetes mellitus screening 11/05/2016  . Alcohol abuse 11/05/2016  . Essential hypertension 11/05/2016  . Grade III hemorrhoids 11/05/2016  . DUB (dysfunctional uterine bleeding) 09/21/2013  . Anemia 09/21/2013  . GIB (gastrointestinal bleeding) 05/29/2011  . Anemia associated with acute blood loss 05/29/2011   Past Medical History:  Diagnosis Date  . Chlamydia   . Gonorrhea   . Hypertension     Family History  Problem Relation Age of Onset  . Diabetes Mother   . Hypertension Other   . Asthma Other     Past Surgical History:  Procedure Laterality Date  . COLONOSCOPY  05/31/2011   Procedure: COLONOSCOPY;  Surgeon: Landry Dyke, MD;  Location: [facility];  Service: Endoscopy;  Laterality: N/A;  . ESOPHAGOGASTRODUODENOSCOPY  05/30/2011   Procedure: ESOPHAGOGASTRODUODENOSCOPY (EGD);  Surgeon: Landry Dyke, MD;  Location: [facility];  Service: Endoscopy;  Laterality: N/A;  . GIVENS CAPSULE STUDY  06/01/2011   Procedure: GIVENS CAPSULE STUDY;  Surgeon: Landry Dyke, MD;  Location: [facility];  Service: Endoscopy;  Laterality: N/A;  . TONSILLECTOMY    . TUBAL LIGATION     Social History   Occupational History  . Not on file  Tobacco Use  . Smoking status: Former Smoker    Packs/day: 0.25    Years: 5.00    Pack years: 1.25    Types: Cigarettes    Quit date: 11/04/2016    Years since quitting: 2.8  . Smokeless tobacco: Never Used  Substance and Sexual Activity  . Alcohol use: Yes     Alcohol/week: 6.0 standard drinks    Types: 6 Cans of beer per week    Comment: 6 40 oz beers/day  . Drug use: No  . Sexual activity: Yes    Birth control/protection: Other-see comments, Surgical

## 2019-09-29 ENCOUNTER — Encounter: Payer: Self-pay | Admitting: Pharmacist

## 2019-09-29 ENCOUNTER — Ambulatory Visit (HOSPITAL_BASED_OUTPATIENT_CLINIC_OR_DEPARTMENT_OTHER): Payer: Medicaid Other | Admitting: Internal Medicine

## 2019-09-29 ENCOUNTER — Ambulatory Visit: Payer: Medicaid Other | Attending: Internal Medicine | Admitting: Pharmacist

## 2019-09-29 ENCOUNTER — Encounter: Payer: Self-pay | Admitting: Internal Medicine

## 2019-09-29 ENCOUNTER — Other Ambulatory Visit: Payer: Self-pay

## 2019-09-29 VITALS — BP 138/87 | HR 99

## 2019-09-29 VITALS — BP 138/87 | HR 99 | Ht 67.0 in | Wt 193.6 lb

## 2019-09-29 DIAGNOSIS — M170 Bilateral primary osteoarthritis of knee: Secondary | ICD-10-CM

## 2019-09-29 DIAGNOSIS — I10 Essential (primary) hypertension: Secondary | ICD-10-CM

## 2019-09-29 DIAGNOSIS — D500 Iron deficiency anemia secondary to blood loss (chronic): Secondary | ICD-10-CM

## 2019-09-29 NOTE — Progress Notes (Signed)
Patient ID: Elizabeth Bush, female    DOB: 11/24/1970  MRN: [id number]  CC: Anemia  Subjective: Elizabeth Bush is a 49 y.o. female who presents for UC visit Her concerns today include:  Hx of ETOH abuse, HTN, severe hemorrhoids,fibroids,iron def anemia, OA knees.    Patient was here today to see clinical pharmacist for follow-up of blood pressure.  He saw in the chart that we have been trying to reach her to go over lab results, so after he saw her he put her in for an urgent care with me today.  Blood pressure today is mildly elevated.  She has not taken amlodipine as yet for the morning.  Anemia: We did blood test on last visit and CBC came back showing that she is very anemic with H&H of 6.7/23 and MCV of 75.  I was unable to reach her via phone.  I did leave voicemail messages but patient states that her phone is new and she has not figured out as yet how to retrieve messages.  We also sent her a letter with the results and she does not recall whether she got the letter or not.  She did pick up her iron supplement but is not taking it consistently.  She tells me she is taking it every other day.  Denies any fatigue or dizziness.    OA knees: saw Dr. Ninfa Linden.  Plan to do TKR starting with the LT knee.  No date set up as yet.  She was unable to get the 4 pronged cane or the Rollator walker.  She states that she has a co-pay for both items with Medicaid and could not afford.  She has a standard cane with her today which her friend gave her.  Patient Active Problem List   Diagnosis Date Noted  . Unilateral primary osteoarthritis, right knee 09/27/2019  . Diabetes mellitus screening 11/05/2016  . Alcohol abuse 11/05/2016  . Essential hypertension 11/05/2016  . Grade III hemorrhoids 11/05/2016  . DUB (dysfunctional uterine bleeding) 09/21/2013  . Anemia 09/21/2013  . GIB (gastrointestinal bleeding) 05/29/2011  . Anemia associated with acute blood loss 05/29/2011     Current Outpatient  Medications on File Prior to Visit  Medication Sig Dispense Refill  . amLODipine (NORVASC) 5 MG tablet Take 1 tablet (5 mg total) by mouth daily. 90 tablet 3  . celecoxib (CELEBREX) 200 MG capsule Take 1 capsule (200 mg total) by mouth daily. 30 capsule 3  . DULoxetine (CYMBALTA) 20 MG capsule Take 1 capsule (20 mg total) by mouth daily. 30 capsule 3  . ferrous sulfate 325 (65 FE) MG tablet Take 1 tablet (325 mg total) by mouth daily with breakfast. 100 tablet 3  . omeprazole (PRILOSEC) 20 MG capsule Take 1 capsule (20 mg total) by mouth daily. 30 capsule 3   No current facility-administered medications on file prior to visit.    Allergies  Allergen Reactions  . Lisinopril Swelling    Swelling of mouth/lips    Social History   Socioeconomic History  . Marital status: Single    Spouse name: Not on file  . Number of children: Not on file  . Years of education: Not on file  . Highest education level: Not on file  Occupational History  . Not on file  Tobacco Use  . Smoking status: Former Smoker    Packs/day: 0.25    Years: 5.00    Pack years: 1.25    Types: Cigarettes  Quit date: 11/04/2016    Years since quitting: 2.9  . Smokeless tobacco: Never Used  Substance and Sexual Activity  . Alcohol use: Yes    Alcohol/week: 6.0 standard drinks    Types: 6 Cans of beer per week    Comment: 6 40 oz beers/day  . Drug use: No  . Sexual activity: Yes    Birth control/protection: Other-see comments, Surgical  Other Topics Concern  . Not on file  Social History Narrative  . Not on file   Social Determinants of Health   Financial Resource Strain:   . Difficulty of Paying Living Expenses:   Food Insecurity:   . Worried About Charity fundraiser in the Last Year:   . Arboriculturist in the Last Year:   Transportation Needs:   . Film/video editor (Medical):   Marland Kitchen Lack of Transportation (Non-Medical):   Physical Activity:   . Days of Exercise per Week:   . Minutes of  Exercise per Session:   Stress:   . Feeling of Stress :   Social Connections:   . Frequency of Communication with Friends and Family:   . Frequency of Social Gatherings with Friends and Family:   . Attends Religious Services:   . Active Member of Clubs or Organizations:   . Attends Archivist Meetings:   Marland Kitchen Marital Status:   Intimate Partner Violence:   . Fear of Current or Ex-Partner:   . Emotionally Abused:   Marland Kitchen Physically Abused:   . Sexually Abused:     Family History  Problem Relation Age of Onset  . Diabetes Mother   . Hypertension Other   . Asthma Other     Past Surgical History:  Procedure Laterality Date  . COLONOSCOPY  05/31/2011   Procedure: COLONOSCOPY;  Surgeon: Landry Dyke, MD;  Location: [facility];  Service: Endoscopy;  Laterality: N/A;  . ESOPHAGOGASTRODUODENOSCOPY  05/30/2011   Procedure: ESOPHAGOGASTRODUODENOSCOPY (EGD);  Surgeon: Landry Dyke, MD;  Location: [facility];  Service: Endoscopy;  Laterality: N/A;  . GIVENS CAPSULE STUDY  06/01/2011   Procedure: GIVENS CAPSULE STUDY;  Surgeon: Landry Dyke, MD;  Location: [facility];  Service: Endoscopy;  Laterality: N/A;  . TONSILLECTOMY    . TUBAL LIGATION      ROS: Review of Systems Negative except as stated above  PHYSICAL EXAM: BP 138/87   Pulse 99   Ht 5\' 7"  (1.702 m)   Wt 193 lb 9.6 oz (87.8 kg)   SpO2 99%   BMI 30.32 kg/m   Physical Exam  General appearance - alert, well appearing, and in no distress Mental status - normal mood, behavior, speech, dress, motor activity, and thought processes Chest - clear to auscultation, no wheezes, rales or rhonchi, symmetric air entry Heart - normal rate, regular rhythm, normal S1, S2, no murmurs, rubs, clicks or gallops Extremities -no lower extremity edema MSK: Enlargement of both knee joints.  She is ambulating with a cane   Depression screen [facility] 2/9 09/12/2019 10/05/2017 06/04/2017  Decreased Interest - 2 3  Down, Depressed,  Hopeless 2 3 3   PHQ - 2 Score 2 5 6   Altered sleeping 2 3 3   Tired, decreased energy 2 3 3   Change in appetite 3 3 2   Feeling bad or failure about yourself  2 3 3   Trouble concentrating 2 1 2   Moving slowly or fidgety/restless 1 (No Data) 3  Suicidal thoughts 0 0 1  PHQ-9 Score 14 18  23    CMP Latest Ref Rng & Units 09/15/2019 04/02/2017 12/28/2016  Glucose 65 - 99 mg/dL 92 89 97  BUN 6 - 24 mg/dL 6 5(L) 6  Creatinine 0.57 - 1.00 mg/dL 0.62 0.54(L) 0.82  Sodium 134 - 144 mmol/L 132(L) 137 139  Potassium 3.5 - 5.2 mmol/L 4.5 3.7 4.0  Chloride 96 - 106 mmol/L 100 101 100  CO2 20 - 29 mmol/L 16(L) 19(L) 18(L)  Calcium 8.7 - 10.2 mg/dL 9.0 9.0 9.0  Total Protein 6.0 - 8.5 g/dL 9.0(H) 9.1(H) 8.3  Total Bilirubin 0.0 - 1.2 mg/dL 0.3 0.3 <0.2  Alkaline Phos 39 - 117 IU/L 105 105 104  AST 0 - 40 IU/L 91(H) 105(H) 112(H)  ALT 0 - 32 IU/L 29 33(H) 40(H)   Lipid Panel     Component Value Date/Time   CHOL 189 09/15/2019 0909   TRIG 97 09/15/2019 0909   HDL 60 09/15/2019 0909   CHOLHDL 3.2 09/15/2019 0909   LDLCALC 112 (H) 09/15/2019 0909    CBC    Component Value Date/Time   WBC 9.4 09/15/2019 0909   WBC 9.3 06/26/2017 0815   RBC 3.10 (L) 09/15/2019 0909   RBC 2.68 (L) 06/26/2017 0815   HGB 6.7 (LL) 09/15/2019 0909   HCT 23.3 (L) 09/15/2019 0909   PLT 365 09/15/2019 0909   MCV 75 (L) 09/15/2019 0909   MCH 21.6 (L) 09/15/2019 0909   MCH 21.3 (L) 06/26/2017 0815   MCHC 28.8 (L) 09/15/2019 0909   MCHC 29.8 (L) 06/26/2017 0815   RDW 18.0 (H) 09/15/2019 0909   LYMPHSABS 1.7 05/30/2011 0415   MONOABS 0.8 05/30/2011 0415   EOSABS 0.3 05/30/2011 0415   BASOSABS 0.1 05/30/2011 0415    ASSESSMENT AND PLAN: 1. Iron deficiency anemia due to chronic blood loss -Advised patient to take the iron supplement every day and if she is able to take it twice a day that would even be better.  She declines blood transfusion at this time.  I will refer to hematology as she would likely benefit  from iron transfusion -She has an appointment coming up with the gastroenterologist later this month and with the gynecologist the early part of next month.  2. Primary osteoarthritis of both knees Plan for total knee replacement.  Told patient that it is very important that she takes iron consistently to get her blood count up prior to any surgery.  3. Essential hypertension Not at goal but she has not taken amlodipine as yet for today.    Patient was given the opportunity to ask questions.  Patient verbalized understanding of the plan and was able to repeat key elements of the plan.   No orders of the defined types were placed in this encounter.    Requested Prescriptions    No prescriptions requested or ordered in this encounter    No follow-ups on file.  Karle Plumber, MD, FACP

## 2019-09-29 NOTE — Patient Instructions (Signed)
Take the iron supplement daily to help bring your blood count up.  If you are able to take it twice a day that would even be better.

## 2019-09-29 NOTE — Progress Notes (Signed)
   S:    PCP: Dr. Wynetta Emery  Patient arrives in good spirits. Presents to the clinic for BP check.  Patient was referred and last seen by Primary Care Provider on 09/12/2019. That visit occurred via telephone. Patient was instructed to see me for BP check today.   Patient reports partial adherence with medications. Has not taken today's dose and believes she may have missed two doses this past week.   Current BP Medications include:  Amlodipine 5 mg daily  Dietary habits include: non-compliant with salt restriction; denies drinking caffeine  Exercise habits include: limited d/t knee pain Family / Social history:  - FHx: DM, HTN - Tobacco: current someday smoker - Alcohol: hx of heavy alcohol consumption (up to 6, 40 oz beers daily)  O:  Vitals:   09/29/19 0920  BP: 138/87  Pulse: 99    Home BP readings: none (no cuff at home)  Last 3 Office BP readings: BP Readings from Last 3 Encounters:  09/29/19 138/87  09/15/19 125/83  11/22/17 (!) 140/93    BMET    Component Value Date/Time   NA 132 (L) 09/15/2019 0909   K 4.5 09/15/2019 0909   CL 100 09/15/2019 0909   CO2 16 (L) 09/15/2019 0909   GLUCOSE 92 09/15/2019 0909   GLUCOSE 84 09/21/2013 0930   BUN 6 09/15/2019 0909   CREATININE 0.62 09/15/2019 0909   CALCIUM 9.0 09/15/2019 0909   GFRNONAA 105 09/15/2019 0909   GFRAA 122 09/15/2019 0909    Renal function: CrCl cannot be calculated (Unknown ideal weight.).  Clinical ASCVD: No  The 10-year ASCVD risk score Mikey Bussing DC Jr., et al., 2013) is: 3.9%   Values used to calculate the score:     Age: 89 years     Sex: Female     Is Non-Hispanic African American: Yes     Diabetic: No     Tobacco smoker: No     Systolic Blood Pressure: 0000000 mmHg     Is BP treated: Yes     HDL Cholesterol: 60 mg/dL     Total Cholesterol: 189 mg/dL   A/P: Hypertension longstanding currently at goal on current medications. BP Goal = <130/80 mmHg. Patient reports partial medication  adherence. Consulted PCP - will have her see pt regarding results of previous CBC. -Continued current regimen. -Counseled on lifestyle modifications for blood pressure control including reduced dietary sodium, increased exercise, adequate sleep  Results reviewed and written information provided.   Total time in face-to-face counseling 15 minutes.   F/U Clinic Visit with PCP.   Benard Halsted, PharmD, [place] [phone]

## 2019-10-04 ENCOUNTER — Ambulatory Visit: Payer: Medicaid Other | Admitting: Gastroenterology

## 2019-10-13 ENCOUNTER — Telehealth: Payer: Self-pay

## 2019-10-13 NOTE — Telephone Encounter (Signed)
Patient left message about scheduling surgery, TKA's.  I have surgery sheet.  Hemoglobin is very low 6.7.  Okay to schedule?

## 2019-10-14 NOTE — Telephone Encounter (Signed)
Actually, surgery can not be set up until her anemia is thoroughly worked up.  This is too low for joint replacement surgery.

## 2019-10-17 NOTE — Telephone Encounter (Signed)
I called and advised patient.  She stated her primary care had recommended a blood transfusion.  She is going to follow up to get that.  She will call me back once done and hemoglobin is up.

## 2019-10-20 ENCOUNTER — Ambulatory Visit: Payer: Medicaid Other | Admitting: Nurse Practitioner

## 2019-10-20 ENCOUNTER — Encounter: Payer: Self-pay | Admitting: Nurse Practitioner

## 2019-10-20 ENCOUNTER — Other Ambulatory Visit: Payer: Medicaid Other

## 2019-10-20 ENCOUNTER — Other Ambulatory Visit (INDEPENDENT_AMBULATORY_CARE_PROVIDER_SITE_OTHER): Payer: Medicaid Other

## 2019-10-20 VITALS — BP 120/82 | HR 100 | Ht 67.0 in | Wt 195.0 lb

## 2019-10-20 DIAGNOSIS — K625 Hemorrhage of anus and rectum: Secondary | ICD-10-CM

## 2019-10-20 DIAGNOSIS — K623 Rectal prolapse: Secondary | ICD-10-CM

## 2019-10-20 DIAGNOSIS — D729 Disorder of white blood cells, unspecified: Secondary | ICD-10-CM

## 2019-10-20 DIAGNOSIS — D509 Iron deficiency anemia, unspecified: Secondary | ICD-10-CM | POA: Diagnosis not present

## 2019-10-20 LAB — IBC + FERRITIN
Ferritin: 25.3 ng/mL (ref 10.0–291.0)
Iron: 22 ug/dL — ABNORMAL LOW (ref 42–145)
Saturation Ratios: 4.7 % — ABNORMAL LOW (ref 20.0–50.0)
Transferrin: 334 mg/dL (ref 212.0–360.0)

## 2019-10-20 LAB — COMPREHENSIVE METABOLIC PANEL
ALT: 20 U/L (ref 0–35)
AST: 69 U/L — ABNORMAL HIGH (ref 0–37)
Albumin: 3.6 g/dL (ref 3.5–5.2)
Alkaline Phosphatase: 93 U/L (ref 39–117)
BUN: 7 mg/dL (ref 6–23)
CO2: 24 mEq/L (ref 19–32)
Calcium: 9.1 mg/dL (ref 8.4–10.5)
Chloride: 106 mEq/L (ref 96–112)
Creatinine, Ser: 0.56 mg/dL (ref 0.40–1.20)
GFR: 138.51 mL/min (ref >60.00)
Glucose, Bld: 90 mg/dL (ref 70–99)
Potassium: 3.9 mEq/L (ref 3.5–5.1)
Sodium: 138 mEq/L (ref 135–145)
Total Bilirubin: 0.4 mg/dL (ref 0.2–1.2)
Total Protein: 9 g/dL — ABNORMAL HIGH (ref 6.0–8.3)

## 2019-10-20 LAB — IRON: Iron: 22 ug/dL — ABNORMAL LOW (ref 42–145)

## 2019-10-20 NOTE — Patient Instructions (Addendum)
If you are age 50 or older, your body mass index should be between 23-30. Your Body mass index is 30.54 kg/m. If this is out of the aforementioned range listed, please consider follow up with your Primary Care Provider.  If you are age 46 or younger, your body mass index should be between 19-25. Your Body mass index is 30.54 kg/m. If this is out of the aformentioned range listed, please consider follow up with your Primary Care Provider.  You have been scheduled for an appointment with Dr. Nadeen Landau at [facility]. Your appointment is on 10-25-19 at 1:30 pm. Please arrive at 1:00 pm for registration. Make certain to bring a list of current medications, including any over the counter medications or vitamins. Also bring your co-pay if you have one as well as your insurance cards. [facility] is located at [address], [address]. Should you need to reschedule your appointment, please contact them at [phone].  Your provider has requested that you go to the basement level for lab work before leaving today. Press "B" on the elevator. The lab is located at the first door on the left as you exit the elevator.  Due to recent changes in healthcare laws, you may see the results of your imaging and laboratory studies on MyChart before your provider has had a chance to review them.  We understand that in some cases there may be results that are confusing or concerning to you. Not all laboratory results come back in the same time frame and the provider may be waiting for multiple results in order to interpret others.  Please give [place] 48 hours in order for your provider to thoroughly review all the results before contacting the office for clarification of your results.   START using Benefiber 1 tablespoon daily.  Apply Vaseline to push rectal prolapse back inside rectum.  You have been scheduled to follow up with Dr. Kelby Aline on November 23, 2019 at 10:20 am.  If your  rectal bleeding worsens before your appointment with Dr. Dema Severin, please go your nearest Emergency Room.

## 2019-10-20 NOTE — Progress Notes (Addendum)
10/20/2019 Elizabeth Bush [id number] 10-13-1970   CHIEF COMPLAINT: Rectal bleeding   HISTORY OF PRESENT ILLNESS:  Past history of alcoholism since age 49, anxiety, depression, arthritis, hypertension, sleep apnea does not use cpap.  She presents today as referred by her primary care physician Dr. Karle Plumber for further evaluation for rectal bleeding.  She stated having "rectal bleeding for ever". She stated having rectal bleeding since the birth of her daughter 28 years ago.  She developed anemia and she received blood transfusion at that time.  She was admitted to the hospital 06/01/2011 due to anemia and GI bleeding.  She presented with vague abdominal pain with complaints of dark maroon stool.  Her hemoglobin level was 7.6.  She underwent an EGD 05/30/2011 by Dr. Paulita Fujita which showed small antral erosions and mild bulbar duodenitis without evidence of active bleeding.  He underwent a colonoscopy 05/31/2011 showed scattered diverticula to the sigmoid colon, internal and external hemorrhoids without evidence of active bleeding.  She underwent a small bowel capsule endoscopy 06/06/2011 which was normal.  She was sent  to the hospital 06/26/2017 by her gynecologist  due to having profound anemia with a hemoglobin level 6.2 and hematocrit 21.9.  Her anemia was due to dysfunctional uterine bleeding at that time.  She received 4 units of packed red blood cells.  Currently, she complains of having rectal bleeding daily.  In the past few years, she would have intermittent bleeding then no rectal bleeding for a few months.  She reports having daily rectal bleeding for the past few weeks which has progressively worsened over the past 5 days.  She is wearing a depends diaper because she has blood in her undergarment which saturates her clothing and soaks through to the furniture she sits on.  She is passing loose stools when she drinks a lot of alcohol.  When she reduces her alcohol intake and eats more solid  food her stools are firm.  No melena.  Laboratory studies 09/15/2019 showed a hemoglobin of 6.7 (Hg 9.7 on 11/22/2017) and hematocrit 23.3 as ordered by her gynecologist Dr.Ervin.  She was started on ferrous sulfate 325 mg once daily.  She has frequent heartburn and occasional upper abdominal pain. No lower abdominal pain.  She denies having any chest pain, palpitations, dizziness or shortness of breath.  She complains of fatigue.  No weight loss.  No fever, sweats or chills.  Infequent NSAID use. She stated she is no longer taking Celebrex.  Past Medical History:  Diagnosis Date  . Chlamydia   . Gonorrhea   . Hypertension    Past Surgical History:  Procedure Laterality Date  . COLONOSCOPY  05/31/2011   Procedure: COLONOSCOPY;  Surgeon: Landry Dyke, MD;  Location: [facility];  Service: Endoscopy;  Laterality: N/A;  . ESOPHAGOGASTRODUODENOSCOPY  05/30/2011   Procedure: ESOPHAGOGASTRODUODENOSCOPY (EGD);  Surgeon: Landry Dyke, MD;  Location: [facility];  Service: Endoscopy;  Laterality: N/A;  . GIVENS CAPSULE STUDY  06/01/2011   Procedure: GIVENS CAPSULE STUDY;  Surgeon: Landry Dyke, MD;  Location: [facility];  Service: Endoscopy;  Laterality: N/A;  . TONSILLECTOMY    . TUBAL LIGATION     Social History: She is single. She drinks two 40 oz beers daily. No drug use. Smoke 1 to 4 cigarrettes when she drinks alcohol.  She drinks more alcohol when with friends, she was unable to quantify how much alcohol is consumed socially.   Family History: Mother 56 DM, arthritis, she  required a colostomy. Father 28 with CVA.  Sister with cancer, no further details are known.  Allergies  Allergen Reactions  . Lisinopril Swelling    Swelling of mouth/lips      Outpatient Encounter Medications as of 10/20/2019  Medication Sig  . amLODipine (NORVASC) 5 MG tablet Take 1 tablet (5 mg total) by mouth daily.  Marland Kitchen amoxicillin (AMOXIL) 500 MG capsule Take 500 mg by mouth daily. Once a day until  upcoming tooth extraction  . celecoxib (CELEBREX) 200 MG capsule Take 1 capsule (200 mg total) by mouth daily.  . DULoxetine (CYMBALTA) 20 MG capsule Take 1 capsule (20 mg total) by mouth daily.  . ferrous sulfate 325 (65 FE) MG tablet Take 1 tablet (325 mg total) by mouth daily with breakfast.  . omeprazole (PRILOSEC) 20 MG capsule Take 1 capsule (20 mg total) by mouth daily.   No facility-administered encounter medications on file as of 10/20/2019.     REVIEW OF SYSTEMS: All other systems reviewed and negative except where noted in the History of Present Illness.   PHYSICAL EXAM: BP 120/82   Pulse 100   Ht 5\' 7"  (1.702 m)   Wt 195 lb (88.5 kg)   BMI 30.54 kg/m  General: 49 year old female in no acute distress. Head: Normocephalic and atraumatic. Eyes:  Sclerae non-icteric, conjunctive pink. Ears: Normal auditory acuity. Mouth: Poor dentition, multiple missing teeth.  No ulcers or lesions.  Neck: Supple, no lymphadenopathy or thyromegaly.  Lungs: Clear bilaterally to auscultation without wheezes, crackles or rhonchi. Heart: Regular rate and rhythm. No murmur, rub or gallop appreciated.  Abdomen: Soft, nontender, non distended. No masses. No hepatosplenomegaly. Normoactive bowel sounds x 4 quadrants.  Rectal: Patient said depends were saturated with a moderate amount of bright red blood and brown soft stool.  External rectal exam shows inflamed friable rectal prolapse mucosa in the setting of minor external hemorrhoids.  Internal hemorrhoids palpated.  No mass.  I consulted with Dr. Tarri Glenn who was in the clinic and she verified the same findings on rectal exam. Musculoskeletal: Symmetrical with no gross deformities. Skin: Warm and dry. No rash or lesions on visible extremities. Extremities: No edema. Neurological: Alert oriented x 4, no focal deficits.  Psychological:  Alert and cooperative. Normal mood and affect.  ASSESSMENT AND PLAN:  36.  49 year old female with profound  anemia most likely due  to chronic GI blood loss secondary  to rectal prolapse with friable rectal mucosa. Labs 09/15/2019 Hg 6.7. Patient was prescribed Ferrous Sulfate 325mg  po daily by her gynecologist.  -Consult with surgeon Dr. Nadeen Landau ASAP for rectal prolapse -CBC, iron, iron saturation, TIBC and ferritin -If hemoglobin less than 7 she may require hospital admission for blood transfusion Benefiber 1 tablespoon once daily to avoid straining and to facilitate the passage of soft easy bowel movements -Apply Vaseline to the prolapsed rectal mucosa and push rectal tissue back into the rectum as tolerated -Schedule colonoscopy after surgical consult completed -Follow-up in the office with Cirigliano in 2 to 3 weeks -Patient to call our office if her symptoms worsen, to present to the emergency room if severe rectal bleeding occurs  2.  GERD -EGD at time of colonoscopy  3.  Alcohol abuse -Advised no alcohol  ADDENDUM 10/21/2019: CBC results remain pending.     CC:  Ladell Pier, MD

## 2019-10-23 LAB — PATHOLOGIST SMEAR REVIEW

## 2019-10-25 ENCOUNTER — Telehealth: Payer: Self-pay | Admitting: General Surgery

## 2019-10-25 DIAGNOSIS — D509 Iron deficiency anemia, unspecified: Secondary | ICD-10-CM

## 2019-10-25 NOTE — Telephone Encounter (Signed)
Left a voicemail for the patient to contact the office. Her CBC from 10/20/2019 was compromised by the lab and will need to be drawn again.

## 2019-10-25 NOTE — Telephone Encounter (Signed)
-----   Message from Noralyn Pick, NP sent at 10/25/2019  6:13 AM EDT ----- Olivia Mackie, I ordered a CBC day of office visit, the CBC order was entered and still shows as active. Can you contact the lab to verify if the CBC was done, if not, then pls contact the patient to have CBC done this week. Pls let me know outcome. Thx

## 2019-10-25 NOTE — Telephone Encounter (Signed)
Called and spoke with the patient and she was informed we need her to come to the lab and have a CBC/w diff drawn as soon as possible. The patient verbalized understanding and will try to find transportation to come in.

## 2019-10-25 NOTE — Telephone Encounter (Signed)
-----   Message from Noralyn Pick, NP sent at 10/25/2019 11:50 AM EDT ----- Olivia Mackie, excellent follow up on this issue, thank you. CBC needed asap. Thank you.

## 2019-10-26 ENCOUNTER — Other Ambulatory Visit (INDEPENDENT_AMBULATORY_CARE_PROVIDER_SITE_OTHER): Payer: Medicaid Other

## 2019-10-26 DIAGNOSIS — D509 Iron deficiency anemia, unspecified: Secondary | ICD-10-CM

## 2019-10-26 LAB — CBC WITH DIFFERENTIAL/PLATELET
Basophils Absolute: 0.1 10*3/uL (ref 0.0–0.1)
Basophils Relative: 0.7 % (ref 0.0–3.0)
Eosinophils Absolute: 0 10*3/uL (ref 0.0–0.7)
Eosinophils Relative: 0.5 % (ref 0.0–5.0)
HCT: 26.5 % — ABNORMAL LOW (ref 36.0–46.0)
Hemoglobin: 8.4 g/dL — ABNORMAL LOW (ref 12.0–15.0)
Lymphocytes Relative: 25.2 % (ref 12.0–46.0)
Lymphs Abs: 1.8 10*3/uL (ref 0.7–4.0)
MCHC: 31.7 g/dL (ref 30.0–36.0)
MCV: 78.8 fl (ref 78.0–100.0)
Monocytes Absolute: 0.8 10*3/uL (ref 0.1–1.0)
Monocytes Relative: 11.6 % (ref 3.0–12.0)
Neutro Abs: 4.5 10*3/uL (ref 1.4–7.7)
Neutrophils Relative %: 62 % (ref 43.0–77.0)
Platelets: 262 10*3/uL (ref 150.0–400.0)
RBC: 3.36 Mil/uL — ABNORMAL LOW (ref 3.87–5.11)
RDW: 25 % — ABNORMAL HIGH (ref 11.5–15.5)
WBC: 7.2 10*3/uL (ref 4.0–10.5)

## 2019-10-27 NOTE — Progress Notes (Signed)
Agree with the assessment and plan as outlined by Colleen Kennedy-Smith, NP.   Lysa Livengood, DO, FACG [facility]   

## 2019-10-30 ENCOUNTER — Encounter: Payer: Self-pay | Admitting: Obstetrics and Gynecology

## 2019-10-30 ENCOUNTER — Ambulatory Visit (INDEPENDENT_AMBULATORY_CARE_PROVIDER_SITE_OTHER): Payer: Medicaid Other | Admitting: Obstetrics and Gynecology

## 2019-10-30 ENCOUNTER — Other Ambulatory Visit: Payer: Self-pay | Admitting: Internal Medicine

## 2019-10-30 ENCOUNTER — Other Ambulatory Visit: Payer: Self-pay

## 2019-10-30 DIAGNOSIS — K625 Hemorrhage of anus and rectum: Secondary | ICD-10-CM

## 2019-10-30 DIAGNOSIS — D219 Benign neoplasm of connective and other soft tissue, unspecified: Secondary | ICD-10-CM

## 2019-10-30 DIAGNOSIS — D500 Iron deficiency anemia secondary to blood loss (chronic): Secondary | ICD-10-CM

## 2019-10-30 DIAGNOSIS — D509 Iron deficiency anemia, unspecified: Secondary | ICD-10-CM | POA: Insufficient documentation

## 2019-10-30 DIAGNOSIS — D259 Leiomyoma of uterus, unspecified: Secondary | ICD-10-CM

## 2019-10-30 DIAGNOSIS — K623 Rectal prolapse: Secondary | ICD-10-CM

## 2019-10-30 NOTE — Progress Notes (Signed)
Elizabeth Bush presents in follow up for her IDA and uterine fibroids. See prior notes. Last seen in hospital for acute anemia in 2019. Anemia at that time was thought to be caused by rectal. She saw GI first of the month and has rectal prolapse and has been referred to general surgery. Pt took a few Depo Provera injections in the past but none since 2019. She reports no vaginal bleeding.   PE AF  VSS Lungs clear Heart RRR Abd soft + BS  A/P IDA d/t chronic blood loss  To have see general surgery and have her rectal prolapse repaired. Follow IDA with PCP and return to see [place] if needed.

## 2019-10-30 NOTE — Progress Notes (Signed)
Pt's screening numbers are high today. She is currently a patient at [facility] and sees them tomorrow. She is on Cymbalta and states she cannot tell that it helps. Encouraged her to discuss this with them tomorrow at her appt. Pt voices understanding and agrees

## 2019-10-31 ENCOUNTER — Ambulatory Visit: Payer: Medicaid Other | Attending: Internal Medicine | Admitting: Internal Medicine

## 2019-10-31 ENCOUNTER — Telehealth: Payer: Self-pay | Admitting: General Surgery

## 2019-10-31 ENCOUNTER — Encounter: Payer: Self-pay | Admitting: Internal Medicine

## 2019-10-31 VITALS — BP 186/101 | HR 119 | Temp 96.3°F | Resp 16 | Wt 195.2 lb

## 2019-10-31 DIAGNOSIS — D259 Leiomyoma of uterus, unspecified: Secondary | ICD-10-CM

## 2019-10-31 DIAGNOSIS — Z87891 Personal history of nicotine dependence: Secondary | ICD-10-CM | POA: Diagnosis not present

## 2019-10-31 DIAGNOSIS — Z758 Other problems related to medical facilities and other health care: Secondary | ICD-10-CM | POA: Insufficient documentation

## 2019-10-31 DIAGNOSIS — K623 Rectal prolapse: Secondary | ICD-10-CM

## 2019-10-31 DIAGNOSIS — K922 Gastrointestinal hemorrhage, unspecified: Secondary | ICD-10-CM

## 2019-10-31 DIAGNOSIS — K648 Other hemorrhoids: Secondary | ICD-10-CM | POA: Diagnosis not present

## 2019-10-31 DIAGNOSIS — Z79899 Other long term (current) drug therapy: Secondary | ICD-10-CM | POA: Insufficient documentation

## 2019-10-31 DIAGNOSIS — Z888 Allergy status to other drugs, medicaments and biological substances status: Secondary | ICD-10-CM | POA: Insufficient documentation

## 2019-10-31 DIAGNOSIS — Z1331 Encounter for screening for depression: Secondary | ICD-10-CM | POA: Diagnosis not present

## 2019-10-31 DIAGNOSIS — D509 Iron deficiency anemia, unspecified: Secondary | ICD-10-CM | POA: Diagnosis not present

## 2019-10-31 DIAGNOSIS — F1010 Alcohol abuse, uncomplicated: Secondary | ICD-10-CM

## 2019-10-31 DIAGNOSIS — F331 Major depressive disorder, recurrent, moderate: Secondary | ICD-10-CM

## 2019-10-31 DIAGNOSIS — D500 Iron deficiency anemia secondary to blood loss (chronic): Secondary | ICD-10-CM

## 2019-10-31 DIAGNOSIS — Z791 Long term (current) use of non-steroidal anti-inflammatories (NSAID): Secondary | ICD-10-CM | POA: Insufficient documentation

## 2019-10-31 DIAGNOSIS — Z8249 Family history of ischemic heart disease and other diseases of the circulatory system: Secondary | ICD-10-CM | POA: Insufficient documentation

## 2019-10-31 DIAGNOSIS — I10 Essential (primary) hypertension: Secondary | ICD-10-CM | POA: Diagnosis not present

## 2019-10-31 DIAGNOSIS — M170 Bilateral primary osteoarthritis of knee: Secondary | ICD-10-CM

## 2019-10-31 MED ORDER — AMLODIPINE BESYLATE 10 MG PO TABS: 10.0000 mg | ORAL_TABLET | Freq: Every day | ORAL | 3 refills | Status: DC

## 2019-10-31 MED ORDER — DULOXETINE HCL 30 MG PO CPEP: 30.0000 mg | ORAL_CAPSULE | Freq: Every day | ORAL | 5 refills | Status: DC

## 2019-10-31 MED FILL — DULoxetine HCL 30 MG CPEP: 30 | 30 days supply | Qty: 30 | Fill #0

## 2019-10-31 MED FILL — AMLODIPINE BESYLATE 10 MG T: 10 | 30 days supply | Qty: 30 | Fill #0

## 2019-10-31 NOTE — Patient Instructions (Signed)
Increase amlodipine to 10 mg daily for better blood pressure control.  I have sent an updated prescription to your pharmacy.  I have increased the Cymbalta to 30 mg daily.  This is the medication that you take for anxiety and depression.  I will have our licensed clinical social worker follow-up with you about some counseling.  Continue to take the iron supplement.  I have submitted a referral for you to see the general surgeon Dr. Dema Severin concerning the rectal prolapse.

## 2019-10-31 NOTE — Progress Notes (Signed)
Patient ID: Elizabeth Bush, female    DOB: [birth year]  MRN: [id number]  CC: Anemia   Subjective: Elizabeth Bush is a 49 y.o. female who presents for chronic ds management f/u Her concerns today include:  Hx of ETOH abuse, HTN, severe hemorrhoids,fibroids,iron def anemia, OA knees.  OA knees: Since last visit with me, she was contacted by Dr. Trevor Mace office and was told that her blood count is too low for them to do surgery.  She would need work-up and correction of the anemia prior to any total knee replacement surgery.  This conversation is documented in the EMR but patient tells me she does not recall the phone call.  She ambulates with a cane.  She is taking the Celebrex.  She is very much limited in her activities during the day due to pain in the knees.  Rectal bleeding: She saw the gastroenterologist Dr. Vivia Ewing nurse practitioner.  She has rectal prolapse and internal hemorrhoids.  It is recommended that she sees the general surgeon Dr. Dema Severin for surgical correction then return to them for EGD and colonoscopy.  I have submitted that referral to Dr. Dema Severin.  She is compliant with taking the iron supplement.  Most recent hemoglobin improved from 6.7-8.4.  Fibroids: She saw the GYN Dr. Rip Harbour yesterday.  She told him that she is not having any vaginal bleeding and that most of the bleeding is from the rectum.  On previous visits with me she told me that she could not tell whether the bleeding is vaginally or from the rectum.  However lately she states she notes blood when she wipes the rectum but not when she wipes the vaginal area.  She is not sure whether her menses have stopped as she is in the range to be perimenopausal  HTN: Blood pressure noted to be elevated today.  Took Norvasc already to day.  Not limiting salt.  No device to check blood pressure.  Depression: Significantly positive depression screen today on PHQ H9.  She feels her depression is worse when by herself.  States  that she gets emotional and cries easily even if she is watching a TV program where someone starts to cry, she becomes tearful.  Good appetite.  Not sleeping well due to pain in the knees.  Admits that she is still drinking.  Patient Active Problem List   Diagnosis Date Noted  . IDA (iron deficiency anemia) 10/30/2019  . Fibroids 10/30/2019  . Unilateral primary osteoarthritis, right knee 09/27/2019  . Diabetes mellitus screening 11/05/2016  . Alcohol abuse 11/05/2016  . Essential hypertension 11/05/2016  . Grade III hemorrhoids 11/05/2016  . Anemia 09/21/2013  . GIB (gastrointestinal bleeding) 05/29/2011     Current Outpatient Medications on File Prior to Visit  Medication Sig Dispense Refill  . celecoxib (CELEBREX) 200 MG capsule Take 1 capsule (200 mg total) by mouth daily. 30 capsule 3  . ferrous sulfate 325 (65 FE) MG tablet Take 1 tablet (325 mg total) by mouth daily with breakfast. (Patient taking differently: Take 325 mg by mouth 2 (two) times daily with a meal. ) 100 tablet 3  . omeprazole (PRILOSEC) 20 MG capsule Take 1 capsule (20 mg total) by mouth daily. 30 capsule 3   No current facility-administered medications on file prior to visit.    Allergies  Allergen Reactions  . Lisinopril Swelling    Swelling of mouth/lips    Social History   Socioeconomic History  . Marital status: Single  Spouse name: Not on file  . Number of children: Not on file  . Years of education: Not on file  . Highest education level: Not on file  Occupational History  . Not on file  Tobacco Use  . Smoking status: Former Smoker    Packs/day: 0.25    Years: 5.00    Pack years: 1.25    Types: Cigarettes    Quit date: 11/04/2016    Years since quitting: 2.9  . Smokeless tobacco: Never Used  Substance and Sexual Activity  . Alcohol use: Yes    Alcohol/week: 6.0 standard drinks    Types: 6 Cans of beer per week    Comment: 6 40 oz beers/day  . Drug use: No  . Sexual activity: Yes     Birth control/protection: Other-see comments, Surgical  Other Topics Concern  . Not on file  Social History Narrative  . Not on file   Social Determinants of Health   Financial Resource Strain:   . Difficulty of Paying Living Expenses:   Food Insecurity: Food Insecurity Present  . Worried About Charity fundraiser in the Last Year: Often true  . Ran Out of Food in the Last Year: Often true  Transportation Needs: Unmet Transportation Needs  . Lack of Transportation (Medical): Yes  . Lack of Transportation (Non-Medical): Yes  Physical Activity:   . Days of Exercise per Week:   . Minutes of Exercise per Session:   Stress:   . Feeling of Stress :   Social Connections:   . Frequency of Communication with Friends and Family:   . Frequency of Social Gatherings with Friends and Family:   . Attends Religious Services:   . Active Member of Clubs or Organizations:   . Attends Archivist Meetings:   Marland Kitchen Marital Status:   Intimate Partner Violence:   . Fear of Current or Ex-Partner:   . Emotionally Abused:   Marland Kitchen Physically Abused:   . Sexually Abused:     Family History  Problem Relation Age of Onset  . Diabetes Mother   . Hypertension Other   . Asthma Other     Past Surgical History:  Procedure Laterality Date  . COLONOSCOPY  05/31/2011   Procedure: COLONOSCOPY;  Surgeon: Landry Dyke, MD;  Location: [facility];  Service: Endoscopy;  Laterality: N/A;  . ESOPHAGOGASTRODUODENOSCOPY  05/30/2011   Procedure: ESOPHAGOGASTRODUODENOSCOPY (EGD);  Surgeon: Landry Dyke, MD;  Location: [facility];  Service: Endoscopy;  Laterality: N/A;  . GIVENS CAPSULE STUDY  06/01/2011   Procedure: GIVENS CAPSULE STUDY;  Surgeon: Landry Dyke, MD;  Location: [facility];  Service: Endoscopy;  Laterality: N/A;  . TONSILLECTOMY    . TUBAL LIGATION      ROS: Review of Systems Negative except as stated above  PHYSICAL EXAM: BP (!) 186/101   Pulse (!) 119   Temp (!) 96.3 F  (35.7 C)   Resp 16   Wt 195 lb 3.2 oz (88.5 kg)   SpO2 97%   BMI 30.57 kg/m   Physical Exam  General appearance - alert, well appearing, and in no distress Mental status - normal mood, behavior, speech, dress, motor activity, and thought processes Neck - supple, no significant adenopathy Chest - clear to auscultation, no wheezes, rales or rhonchi, symmetric air entry Heart - normal rate, regular rhythm, normal S1, S2, no murmurs, rubs, clicks or gallops Musculoskeletal -she ambulates with a cane with somewhat of a bent posture.  Knees are bowed.  Moderate to severe discomfort with passive range of motion Extremities -no lower extremity edema Neurologic: Very fine tremors noted in the hands Depression screen [facility] 2/9 10/31/2019 10/30/2019 09/12/2019  Decreased Interest 2 1 -  Down, Depressed, Hopeless 2 3 2   PHQ - 2 Score 4 4 2   Altered sleeping 2 3 2   Tired, decreased energy 1 3 2   Change in appetite 2 3 3   Feeling bad or failure about yourself  1 3 2   Trouble concentrating 1 3 2   Moving slowly or fidgety/restless - 2 1  Suicidal thoughts 0 0 0  PHQ-9 Score 11 21 14   Some recent data might be hidden    CMP Latest Ref Rng & Units 10/20/2019 09/15/2019 04/02/2017  Glucose 70 - 99 mg/dL 90 92 89  BUN 6 - 23 mg/dL 7 6 5(L)  Creatinine 0.40 - 1.20 mg/dL 0.56 0.62 0.54(L)  Sodium 135 - 145 mEq/L 138 132(L) 137  Potassium 3.5 - 5.1 mEq/L 3.9 4.5 3.7  Chloride 96 - 112 mEq/L 106 100 101  CO2 19 - 32 mEq/L 24 16(L) 19(L)  Calcium 8.4 - 10.5 mg/dL 9.1 9.0 9.0  Total Protein 6.0 - 8.3 g/dL 9.0(H) 9.0(H) 9.1(H)  Total Bilirubin 0.2 - 1.2 mg/dL 0.4 0.3 0.3  Alkaline Phos 39 - 117 U/L 93 105 105  AST 0 - 37 U/L 69(H) 91(H) 105(H)  ALT 0 - 35 U/L 20 29 33(H)   Lipid Panel     Component Value Date/Time   CHOL 189 09/15/2019 0909   TRIG 97 09/15/2019 0909   HDL 60 09/15/2019 0909   CHOLHDL 3.2 09/15/2019 0909   LDLCALC 112 (H) 09/15/2019 0909    CBC    Component Value Date/Time    WBC 7.2 10/26/2019 1450   RBC 3.36 (L) 10/26/2019 1450   HGB 8.4 Repeated and verified X2. (L) 10/26/2019 1450   HGB 6.7 (LL) 09/15/2019 0909   HCT 26.5 (L) 10/26/2019 1450   HCT 23.3 (L) 09/15/2019 0909   PLT 262.0 10/26/2019 1450   PLT 365 09/15/2019 0909   MCV 78.8 10/26/2019 1450   MCV 75 (L) 09/15/2019 0909   MCH 21.6 (L) 09/15/2019 0909   MCH 21.3 (L) 06/26/2017 0815   MCHC 31.7 10/26/2019 1450   RDW 25.0 (H) 10/26/2019 1450   RDW 18.0 (H) 09/15/2019 0909   LYMPHSABS 1.8 10/26/2019 1450   MONOABS 0.8 10/26/2019 1450   EOSABS 0.0 10/26/2019 1450   BASOSABS 0.1 10/26/2019 1450    ASSESSMENT AND PLAN:  1. Essential hypertension Not at goal.  Increase amlodipine to 10 mg daily - amLODipine (NORVASC) 10 MG tablet; Take 1 tablet (10 mg total) by mouth daily.  Dispense: 90 tablet; Refill: 3  2. Moderate episode of recurrent major depressive disorder (HCC) Still active.  I recommend referral to behavioral health but patient state transportation will be an issue.  Will have our LCSW follow-up with her to see whether virtual visits can be done Increase Cymbalta to 30 mg.  Patient is agreeable to this.  She denies any suicidal ideation at this time - DULoxetine (CYMBALTA) 30 MG capsule; Take 1 capsule (30 mg total) by mouth daily.  Dispense: 30 capsule; Refill: 5  3. Rectal prolapse 4. Iron deficiency anemia due to chronic blood loss Continue iron supplement.  Her hemoglobin/hematocrit is improving.  I have submitted the referral to the general surgeon Dr. Dema Severin.  5. Primary osteoarthritis of both knees Orthopedics has placed TKA on hold until anemia  is further worked up and corrected  6. Uterine leiomyoma, unspecified location Reports no vaginal bleeding at this time  7. Alcohol abuse Elevated blood pressure and pulse most likely due to alcohol withdrawal.  Encouraged her to get into a treatment program   Patient was given the opportunity to ask questions.  Patient  verbalized understanding of the plan and was able to repeat key elements of the plan.   No orders of the defined types were placed in this encounter.    Requested Prescriptions   Signed Prescriptions Disp Refills  . DULoxetine (CYMBALTA) 30 MG capsule 30 capsule 5    Sig: Take 1 capsule (30 mg total) by mouth daily.  Marland Kitchen amLODipine (NORVASC) 10 MG tablet 90 tablet 3    Sig: Take 1 tablet (10 mg total) by mouth daily.    Return in about 6 weeks (around 12/12/2019).  Karle Plumber, MD, FACP

## 2019-10-31 NOTE — Telephone Encounter (Signed)
Notified the patient that labs orders were placed for 2 weeks for her to come in and have a repeat CBC. Patient verbalized understanding.

## 2019-10-31 NOTE — Telephone Encounter (Signed)
Patient was notified that on her AVS shows all of the information regarding her appointment with Dr Dema Severin at [place] 10/25/2019 at 1:30 pt verbalized understanding.

## 2019-10-31 NOTE — Addendum Note (Signed)
Addended by: Karle Plumber B on: 10/31/2019 05:50 PM   Modules accepted: Level of Service

## 2019-10-31 NOTE — Telephone Encounter (Signed)
-----   Message from Noralyn Pick, NP sent at 10/31/2019  4:37 PM EDT ----- I called the patient, discussed her Hg has improved from 4/30. She is waiting to hear back from surgeon Dr. Orest Dikes office for evaluation for rectal prolapse.  Repeat CBC in 2 week.  Tracy, pls provide patient with a lab order to repeat a cbc in 2 weeks. Can you also pls contact Dr. Orest Dikes office to verify her appointment date. Thx

## 2019-11-02 ENCOUNTER — Telehealth: Payer: Self-pay | Admitting: Internal Medicine

## 2019-11-02 NOTE — Telephone Encounter (Signed)
-----   Message from Ena Dawley sent at 11/02/2019  3:07 PM EDT ----- Regarding: RE: Please get her in with a general surgeon as soon as you can. Good Afternoon  DR Wynetta Emery  Patient schedule with Dr  Nadeen Landau  7/13 @10 :45am   ----- Message ----- From: Ladell Pier, MD Sent: 10/31/2019   1:28 PM EDT To: Ena Dawley Subject: Please get her in with a general surgeon as #

## 2019-11-08 MED FILL — AMLODIPINE BESYLATE 10 MG T: 10 | 30 days supply | Qty: 30 | Fill #0

## 2019-11-08 MED FILL — DULoxetine HCL 30 MG CPEP: 30 | 30 days supply | Qty: 30 | Fill #0

## 2019-11-13 ENCOUNTER — Telehealth: Payer: Self-pay | Admitting: Internal Medicine

## 2019-11-13 NOTE — Telephone Encounter (Signed)
Patient called requesting to speak with the social worker regarding transportation. Patient was informed to contact medicaid since she has medicaid and they would help her with transportation. Patient still requested to speak with the social worker. Please f/u.

## 2019-11-14 NOTE — Telephone Encounter (Signed)
Follow up call placed to patient. Patient shared she has unstable transportation which negatively impacts management of medical care. LCSW informed patient of Medicaid transportation and provided contact numbers. Pt was also provided information on [employer] noting application was mailed to pt's residence on file. Call was disconnected. LCSW attempted to call pt back and left a voicemail to follow up with any additional questions.

## 2019-11-23 ENCOUNTER — Ambulatory Visit: Payer: Medicaid Other | Admitting: Gastroenterology

## 2019-11-24 ENCOUNTER — Telehealth: Payer: Self-pay | Admitting: Licensed Clinical Social Worker

## 2019-11-24 NOTE — Telephone Encounter (Signed)
LCSW placed call to patient to assist with transportation resources. Two voice messages were left for patient to provide a return call.

## 2019-11-30 ENCOUNTER — Telehealth: Payer: Self-pay

## 2019-11-30 NOTE — Telephone Encounter (Signed)
Call placed to patient and completed [employer] application and application then faxed to [employer].   She is requesting an order for a rollator

## 2019-12-01 ENCOUNTER — Telehealth: Payer: Self-pay

## 2019-12-01 NOTE — Telephone Encounter (Signed)
I returned patient's call regarding scheduling TKA.  Per my conversation with Dr. Ninfa Linden, patient needs to follow up with physicians and have rectal/internal bleeding (causing anemia) addressed before scheduling TKA.  I advised patient to keep upcoming appointments with PCP and GI.  Asked her to keep me informed.

## 2019-12-05 ENCOUNTER — Ambulatory Visit: Payer: Medicaid Other | Admitting: Nurse Practitioner

## 2019-12-08 ENCOUNTER — Telehealth: Payer: Self-pay

## 2019-12-08 NOTE — Telephone Encounter (Signed)
Received call from pt stating she missed 2 phone calls from this number & wanted to know who called her. I advised her of her appt next week on Tuesday & pt said she is aware of that. No notes in chart of any person calling her today. I asked the pt if she can hold while I check with the nurse to see if she called the pt. The clinical staff stated no one has called pt. Upon review of chart the last message from pt was about her transportation app.  The nurse case manager completed [employer] application then faxed to [employer] on 11/30/19.

## 2019-12-08 NOTE — Telephone Encounter (Signed)
Pt called in and would like to check the status of her transportation Application .  She stated she has missed 2 appt due to transportation.   [employer] number [phone]

## 2019-12-11 ENCOUNTER — Telehealth: Payer: Self-pay

## 2019-12-11 NOTE — Telephone Encounter (Signed)
Call placed to patient and provided her with the phone number for [employer] to check on the status of her SCAT application.  Also informed her that she should call her insurance company, now a managed medicaid plan, to inquire about transportation to medical appts.

## 2019-12-12 ENCOUNTER — Ambulatory Visit: Payer: Medicaid Other | Attending: Internal Medicine | Admitting: Internal Medicine

## 2019-12-12 ENCOUNTER — Other Ambulatory Visit: Payer: Self-pay

## 2019-12-12 ENCOUNTER — Ambulatory Visit: Payer: Medicaid Other | Admitting: Internal Medicine

## 2019-12-12 DIAGNOSIS — Z748 Other problems related to care provider dependency: Secondary | ICD-10-CM

## 2019-12-12 DIAGNOSIS — M170 Bilateral primary osteoarthritis of knee: Secondary | ICD-10-CM | POA: Diagnosis not present

## 2019-12-12 DIAGNOSIS — K625 Hemorrhage of anus and rectum: Secondary | ICD-10-CM | POA: Diagnosis not present

## 2019-12-12 DIAGNOSIS — R3981 Functional urinary incontinence: Secondary | ICD-10-CM | POA: Insufficient documentation

## 2019-12-12 DIAGNOSIS — R159 Full incontinence of feces: Secondary | ICD-10-CM

## 2019-12-12 NOTE — Progress Notes (Addendum)
Virtual Visit via Telephone Note Due to current restrictions/limitations of in-office visits due to the COVID-19 pandemic, this scheduled clinical appointment was converted to a telehealth visit.  This was suppose to be an in person visit but pt states she lacks transportation.  I connected with Elizabeth Bush on 12/12/19 at 10:06 a.m  by telephone and verified that I am speaking with the correct person using two identifiers. I am in my office.  The patient is at home.  Only the patient and myself participated in this encounter.  I discussed the limitations, risks, security and privacy concerns of performing an evaluation and management service by telephone and the availability of in person appointments. I also discussed with the patient that there may be a patient responsible charge related to this service. The patient expressed understanding and agreed to proceed.   History of Present Illness: Hx of ETOH abuse, HTN, severe hemorrhoids,rectal prolapse, fibroids,iron def anemia, OA knees, MDD.  C/o being in a lot of pain with her knees. She has stairs in her house.   On Celebrex and Cymbalta and has Medicaid but not able to afford the $4 co-pay.  "I'm trying to figure out what to do."  Has a lot of life stresses.  Having to take care of her sister's grandkids.  Mom has had a stroke; mom lives her younger sister.  She has issues with transportation.  Our caseworker had completed a SCAT application for her and sent it in.  She spot with case worker yesterday about this.  She says she called [employer] yesterday and was told to call us -requesting incontinence supplies.  Has incontinence of urine and feces because she is unable to make it to bathroom in time due to severe arthritis in needs.   Having a lot of rectal bleeding.  Taking the iron supplement. Appt with Dr. Dema Severin was changed by his office from 11/28/2019 to Sept.  Told that they need to wait until she gets her new Medicaid card.  Pt states she  will have her new card in August.    Outpatient Encounter Medications as of 12/12/2019  Medication Sig  . amLODipine (NORVASC) 10 MG tablet Take 1 tablet (10 mg total) by mouth daily.  . celecoxib (CELEBREX) 200 MG capsule Take 1 capsule (200 mg total) by mouth daily.  . DULoxetine (CYMBALTA) 30 MG capsule Take 1 capsule (30 mg total) by mouth daily.  . ferrous sulfate 325 (65 FE) MG tablet Take 1 tablet (325 mg total) by mouth daily with breakfast. (Patient taking differently: Take 325 mg by mouth 2 (two) times daily with a meal. )  . omeprazole (PRILOSEC) 20 MG capsule Take 1 capsule (20 mg total) by mouth daily.   No facility-administered encounter medications on file as of 12/12/2019.      Observations/Objective: No direct observation done as this was a telephone encounter.  Assessment and Plan: 1. Primary osteoarthritis of both knees -Advised patient to call our pharmacy and see whether they would allow her to credit the Cymbalta and Celebrex.  She said she will do that.  Ortho would not schedule her for surgery until the issue of severe anemia with rectal bleeding is taken care of  2. Rectal bleeding -Continue iron supplement.  Advised patient that if she develops any dizziness she should be seen in the emergency room to see whether she needs a blood transfusion.  She plans to keep the appointment with Dr. 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  5. Dependent for transportation I will speak with caseworker to see whether the form needs to be resubmitted to [employer]  Follow Up Instructions: 2 mths   I discussed the assessment and treatment plan with the patient. The patient was provided an opportunity to ask questions and all were answered. The patient agreed with the plan and demonstrated an understanding of the  instructions.   The patient was advised to call back or seek an in-person evaluation if the symptoms worsen or if the condition fails to improve as anticipated.  I provided 18 minutes of non-face-to-face time during this encounter.   Karle Plumber, MD

## 2019-12-12 NOTE — Progress Notes (Signed)
Received a form that pt is requesting incontinence supplies

## 2019-12-13 ENCOUNTER — Telehealth: Payer: Self-pay | Admitting: Internal Medicine

## 2019-12-13 NOTE — Telephone Encounter (Signed)
Please advise.   Copied from [place] [id number]. Topic: General - Other >> Dec 13, 2019  3:59 PM Elizabeth Bush wrote: Patient called to inform Dr. Wynetta Emery that she needs an order placed for Bush wheelchair due to her not being able to get around in her apartment.

## 2019-12-13 NOTE — Telephone Encounter (Signed)
Will forward to pcp

## 2019-12-14 NOTE — Telephone Encounter (Signed)
Contacted pt and made aware that rx is ready for pick up. Pt doesn't have any questions or concerns

## 2019-12-20 ENCOUNTER — Telehealth: Payer: Self-pay | Admitting: Internal Medicine

## 2019-12-20 MED FILL — DULoxetine HCL 30 MG CPEP: 30 | 30 days supply | Qty: 30 | Fill #1

## 2019-12-20 MED FILL — OMEPRAZOLE 20 MG CAP: 20 | 30 days supply | Qty: 30 | Fill #1

## 2019-12-20 MED FILL — AMLODIPINE BESYLATE 10 MG T: 10 | 30 days supply | Qty: 30 | Fill #1

## 2019-12-20 MED FILL — CELECOXIB 200 MG CAPSULE: 200 | 30 days supply | Qty: 30 | Fill #1

## 2019-12-20 NOTE — Telephone Encounter (Signed)
Copied from [place] [id number]. Topic: General - Other >> Dec 20, 2019  9:17 AM Leward Quan A wrote: Reason for CRM: Patient called to inquire of Dr Wynetta Emery about Rx that was to have been sent out to get her some adult diapers. Per patient it have been over a week and she is still waiting for the delivery. Please advise

## 2019-12-20 NOTE — Telephone Encounter (Signed)
Returned pt call and made aware that form for incontinence supplies were faxed. She can give [employer] a call to see how long the process is. Pt states she understands and doesn't have any questions or concerns

## 2019-12-21 ENCOUNTER — Telehealth: Payer: Self-pay

## 2019-12-21 NOTE — Telephone Encounter (Signed)
This CM spoke to Elizabeth Bush who confirmed receipt of the application.  She stated that they need to call the patient for her assessment

## 2019-12-25 NOTE — Telephone Encounter (Signed)
Copied from [place] [phone]. Topic: General - Other >> Dec 25, 2019 10:35 AM Celene Kras wrote: Reason for CRM: Meredith Mody, from Dr. Marcial Pacas office, called and is requesting to know why the pt was not cleared for surgery. Please Advise. [phone]

## 2019-12-26 NOTE — Telephone Encounter (Signed)
Anderson Malta from [facility] called and stated they faxed back order for the supplies on 8/9.  She stated the order was incomplete.  She stated everything was on that fax that they are needing.  She is going to refax it today  [facility] number  [phone] Fax [phone]

## 2019-12-26 NOTE — Telephone Encounter (Signed)
Called and requested a new form to be faxed over so that the corrections can be made and will fax over new form once completed

## 2019-12-27 NOTE — Telephone Encounter (Signed)
Will forward to pcp

## 2019-12-28 NOTE — Telephone Encounter (Signed)
Returned call to [facility] and provided Dr. Wynetta Emery message

## 2020-01-19 DIAGNOSIS — R32 Unspecified urinary incontinence: Secondary | ICD-10-CM | POA: Diagnosis not present

## 2020-01-23 DIAGNOSIS — K649 Unspecified hemorrhoids: Secondary | ICD-10-CM | POA: Diagnosis not present

## 2020-01-31 ENCOUNTER — Encounter: Payer: Self-pay | Admitting: Internal Medicine

## 2020-01-31 NOTE — Progress Notes (Signed)
I received note from Dr. Nadeen Landau of [place] [facility].  He saw patient on 01/23/2020.  Patient diagnosed with prolapsing internal hemorrhoids and some degree of fecal incontinence.  He recommended that she take daily fiber supplement like Metamucil or Citrucel, drink 8 ounces of water daily and minimize her time on the commode to 2 to 3 minutes.  He placed referral to [facility] for pelvic floor PT plus possible biofeedback.  She was referred to GI for colonoscopy.  Plan to see her back in 3 months.  He did an anoscopy and decompress 3 column internal hemorrhoids.

## 2020-02-06 ENCOUNTER — Ambulatory Visit (HOSPITAL_BASED_OUTPATIENT_CLINIC_OR_DEPARTMENT_OTHER): Payer: Medicaid Other | Admitting: Pharmacist

## 2020-02-06 ENCOUNTER — Encounter: Payer: Self-pay | Admitting: Internal Medicine

## 2020-02-06 ENCOUNTER — Ambulatory Visit: Payer: Medicaid Other | Attending: Internal Medicine | Admitting: Internal Medicine

## 2020-02-06 ENCOUNTER — Other Ambulatory Visit: Payer: Self-pay

## 2020-02-06 VITALS — BP 116/78 | HR 88 | Temp 97.7°F | Resp 16 | Wt 199.4 lb

## 2020-02-06 DIAGNOSIS — D500 Iron deficiency anemia secondary to blood loss (chronic): Secondary | ICD-10-CM

## 2020-02-06 DIAGNOSIS — F1010 Alcohol abuse, uncomplicated: Secondary | ICD-10-CM | POA: Diagnosis not present

## 2020-02-06 DIAGNOSIS — Z23 Encounter for immunization: Secondary | ICD-10-CM | POA: Diagnosis not present

## 2020-02-06 DIAGNOSIS — M170 Bilateral primary osteoarthritis of knee: Secondary | ICD-10-CM | POA: Diagnosis not present

## 2020-02-06 DIAGNOSIS — F419 Anxiety disorder, unspecified: Secondary | ICD-10-CM

## 2020-02-06 DIAGNOSIS — F331 Major depressive disorder, recurrent, moderate: Secondary | ICD-10-CM

## 2020-02-06 NOTE — Progress Notes (Signed)
Patient presents for vaccination against influenza per orders of Dr. Johnson. Consent given. Counseling provided. No contraindications exists. Vaccine administered without incident.  ° °Luke Van Ausdall, PharmD, CPP °Clinical Pharmacist °[facility] °[phone] ° °

## 2020-02-06 NOTE — Patient Instructions (Addendum)
Continue taking your current medications.  I will have our social worker touch base with you given the depression and anxiety.  I have referred you to the hematologist to be considered for iron transfusions.  We will schedule a follow-up appointment with the orthopedics for injections to the knees.  We will schedule a follow-up appointment with the gastroenterologist for colonoscopy.  Influenza Virus Vaccine injection (Fluarix) What is this medicine? INFLUENZA VIRUS VACCINE (in floo EN zuh VAHY ruhs vak SEEN) helps to reduce the risk of getting influenza also known as the flu. This medicine may be used for other purposes; ask your health care provider or pharmacist if you have questions. COMMON BRAND NAME(S): Fluarix, Fluzone What should I tell my health care provider before I take this medicine? They need to know if you have any of these conditions:  bleeding disorder like hemophilia  fever or infection  Guillain-Barre syndrome or other neurological problems  immune system problems  infection with the human immunodeficiency virus (HIV) or AIDS  low blood platelet counts  multiple sclerosis  an unusual or allergic reaction to influenza virus vaccine, eggs, chicken proteins, latex, gentamicin, other medicines, foods, dyes or preservatives  pregnant or trying to get pregnant  breast-feeding How should I use this medicine? This vaccine is for injection into a muscle. It is given by a health care professional. A copy of Vaccine Information Statements will be given before each vaccination. Read this sheet carefully each time. The sheet may change frequently. Talk to your pediatrician regarding the use of this medicine in children. Special care may be needed. Overdosage: If you think you have taken too much of this medicine contact a poison control center or emergency room at once. NOTE: This medicine is only for you. Do not share this medicine with others. What if I miss a  dose? This does not apply. What may interact with this medicine?  chemotherapy or radiation therapy  medicines that lower your immune system like etanercept, anakinra, infliximab, and adalimumab  medicines that treat or prevent blood clots like warfarin  phenytoin  steroid medicines like prednisone or cortisone  theophylline  vaccines This list may not describe all possible interactions. Give your health care provider a list of all the medicines, herbs, non-prescription drugs, or dietary supplements you use. Also tell them if you smoke, drink alcohol, or use illegal drugs. Some items may interact with your medicine. What should I watch for while using this medicine? Report any side effects that do not go away within 3 days to your doctor or health care professional. Call your health care provider if any unusual symptoms occur within 6 weeks of receiving this vaccine. You may still catch the flu, but the illness is not usually as bad. You cannot get the flu from the vaccine. The vaccine will not protect against colds or other illnesses that may cause fever. The vaccine is needed every year. What side effects may I notice from receiving this medicine? Side effects that you should report to your doctor or health care professional as soon as possible:  allergic reactions like skin rash, itching or hives, swelling of the face, lips, or tongue Side effects that usually do not require medical attention (report to your doctor or health care professional if they continue or are bothersome):  fever  headache  muscle aches and pains  pain, tenderness, redness, or swelling at site where injected  weak or tired This list may not describe all possible side effects. Call  your doctor for medical advice about side effects. You may report side effects to FDA at 1-800-FDA-1088. Where should I keep my medicine? This vaccine is only given in a clinic, pharmacy, doctor's office, or other health care  setting and will not be stored at home. NOTE: This sheet is a summary. It may not cover all possible information. If you have questions about this medicine, talk to your doctor, pharmacist, or health care provider.  2020 Elsevier/Gold Standard (2007-11-30 09:30:40)

## 2020-02-06 NOTE — Progress Notes (Signed)
Patient ID: Elizabeth Bush, female    DOB: September 02, 1970  MRN: [id number]  CC: Blood Pressure Check   Subjective: Elizabeth Bush is a 49 y.o. female who presents for chronic ds management.  She has her medications with her. Her concerns today include:  Hx of ETOH abuse, HTN, severe hemorrhoids,rectal prolapse, fibroids,iron def anemia, OA knees, MDD, functional urinary and fecal incontinence.  Patient has several med bottles with her some of which are duplicates of Celebrex, amlodipine and Cymbalta.  Besides the typed instructions from the pharmacy, each bottle has hand written note on it for what the medication is for.  However she tells me that she still has a hard time keeping up with her medicines and has difficulty reading the bottles.  She feels she needs glasses.  She uses over-the-counter readers.  Depression/Anxiety:  Mother died less than 1 wk ago.  Remer Macho will be held in 3 days. She did get to see her a few days prior to her death. She is in a state of shock and disbelief. She has a large family but they do not connect well with each other.  Denies any suicidal ideation. -She is taking the Cymbalta and finds it helpful.  Anemia:  Not taking iron.  Does not like the way the pills taste.  Getting her to take iron consistently has been an ongoing issue for the past few years. Did see the surgeon Dr. Dema Severin on the seventh of this month.  She was diagnosed with prolapsing internal hemorrhoids and fecal incontinence.  He recommended that she take fiber supplements like Metamucil or Citrucel and increase water intake.  He also referred her for pelvic floor PT and biofeedback training.  He did an anoscopy and decompress 3 column internal hemorrhoids.  Plan is to get her back to the gastroenterologist for colonoscopy. -She tells me that she has not had any rectal or vaginal bleeding lately.  She endorses fatigue and dizziness at times.  Severe OA knees: Needs knee replacement surgery.  However Dr.  Ninfa Linden wants the anemia much improved before pursuing surgery.  She is taking Celebrex.  She would like to go back to Dr. Ninfa Linden to have steroid injection to the knees until the anemia issue is resolved and she can pursue surgery.  Still drinking two 40 oz beer a day.  Eating more to help decrease ETOH intake.    Patient Active Problem List   Diagnosis Date Noted  . Influenza vaccine needed 02/06/2020  . Dependent for transportation 12/12/2019  . 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 Functional urinary incontinence 12/12/2019  . Rectal prolapse 10/31/2019  . Moderate episode of recurrent major depressive disorder ([place]) 10/31/2019  . Primary osteoarthritis of both knees 10/31/2019  . IDA (iron deficiency anemia) 10/30/2019  . Fibroids 10/30/2019  . Unilateral primary osteoarthritis, right knee 09/27/2019  . Alcohol abuse 11/05/2016  . Essential hypertension 11/05/2016  . Grade III hemorrhoids 11/05/2016  . GIB (gastrointestinal bleeding) 05/29/2011     Current Outpatient Medications on File Prior to Visit  Medication Sig Dispense Refill  . amLODipine (NORVASC) 10 MG tablet Take 1 tablet (10 mg total) by mouth daily. 90 tablet 3  . celecoxib (CELEBREX) 200 MG capsule Take 1 capsule (200 mg total) by mouth daily. 30 capsule 3  . DULoxetine (CYMBALTA) 30 MG capsule Take 1 capsule (30 mg total) by mouth daily. 30 capsule 5  . ferrous sulfate 325 (65 FE) MG tablet Take 1 tablet (325 mg total) by  mouth daily with breakfast. (Patient taking differently: Take 325 mg by mouth 2 (two) times daily with a meal. ) 100 tablet 3  . omeprazole (PRILOSEC) 20 MG capsule Take 1 capsule (20 mg total) by mouth daily. 30 capsule 3   No current facility-administered medications on file prior to visit.    Allergies  Allergen Reactions  . Lisinopril Swelling    Swelling of mouth/lips    Social History   Socioeconomic History  . Marital status: Single    Spouse name: Not on file  . Number  of children: Not on file  . Years of education: Not on file  . Highest education level: Not on file  Occupational History  . Not on file  Tobacco Use  . Smoking status: Former Smoker    Packs/day: 0.25    Years: 5.00    Pack years: 1.25    Types: Cigarettes    Quit date: 11/04/2016    Years since quitting: 3.2  . Smokeless tobacco: Never Used  Substance and Sexual Activity  . Alcohol use: Yes    Alcohol/week: 6.0 standard drinks    Types: 6 Cans of beer per week    Comment: 6 40 oz beers/day  . Drug use: No  . Sexual activity: Yes    Birth control/protection: Other-see comments, Surgical  Other Topics Concern  . Not on file  Social History Narrative  . Not on file   Social Determinants of Health   Financial Resource Strain:   . Difficulty of Paying Living Expenses: Not on file  Food Insecurity: Food Insecurity Present  . Worried About Charity fundraiser in the Last Year: Often true  . Ran Out of Food in the Last Year: Often true  Transportation Needs: Unmet Transportation Needs  . Lack of Transportation (Medical): Yes  . Lack of Transportation (Non-Medical): Yes  Physical Activity:   . Days of Exercise per Week: Not on file  . Minutes of Exercise per Session: Not on file  Stress:   . Feeling of Stress : Not on file  Social Connections:   . Frequency of Communication with Friends and Family: Not on file  . Frequency of Social Gatherings with Friends and Family: Not on file  . Attends Religious Services: Not on file  . Active Member of Clubs or Organizations: Not on file  . Attends Archivist Meetings: Not on file  . Marital Status: Not on file  Intimate Partner Violence:   . Fear of Current or Ex-Partner: Not on file  . Emotionally Abused: Not on file  . Physically Abused: Not on file  . Sexually Abused: Not on file    Family History  Problem Relation Age of Onset  . Diabetes Mother   . Hypertension Other   . Asthma Other     Past Surgical  History:  Procedure Laterality Date  . COLONOSCOPY  05/31/2011   Procedure: COLONOSCOPY;  Surgeon: Landry Dyke, MD;  Location: [facility];  Service: Endoscopy;  Laterality: N/A;  . ESOPHAGOGASTRODUODENOSCOPY  05/30/2011   Procedure: ESOPHAGOGASTRODUODENOSCOPY (EGD);  Surgeon: Landry Dyke, MD;  Location: [facility];  Service: Endoscopy;  Laterality: N/A;  . GIVENS CAPSULE STUDY  06/01/2011   Procedure: GIVENS CAPSULE STUDY;  Surgeon: Landry Dyke, MD;  Location: [facility];  Service: Endoscopy;  Laterality: N/A;  . TONSILLECTOMY    . TUBAL LIGATION      ROS: Review of Systems Negative except as stated above  PHYSICAL EXAM: BP  116/78   Pulse 88   Temp 97.7 F (36.5 C)   Resp 16   Wt 199 lb 6.4 oz (90.4 kg)   SpO2 95%   BMI 31.23 kg/m   Physical Exam  General appearance - alert, well appearing, and in no distress Mental status - normal mood, behavior, speech, dress, motor activity, and thought processes Chest - clear to auscultation, no wheezes, rales or rhonchi, symmetric air entry Heart - normal rate, regular rhythm, normal S1, S2, no murmurs, rubs, clicks or gallops Extremities -trace lower extremity edema.  She has severe crepitus on passive movement of both knees.  She ambulates with a cane.  Depression screen [facility] 2/9 02/06/2020 10/31/2019 10/30/2019  Decreased Interest 2 2 1   Down, Depressed, Hopeless 3 2 3   PHQ - 2 Score 5 4 4   Altered sleeping 3 2 3   Tired, decreased energy 3 1 3   Change in appetite 3 2 3   Feeling bad or failure about yourself  3 1 3   Trouble concentrating 3 1 3   Moving slowly or fidgety/restless 3 - 2  Suicidal thoughts 0 0 0  PHQ-9 Score 23 11 21   Some recent data might be hidden   GAD 7 : Generalized Anxiety Score 02/06/2020 10/30/2019 09/12/2019 10/05/2017  Nervous, Anxious, on Edge 3 3 2 3   Control/stop worrying 3 3 3 3   Worry too much - different things 3 3 3 3   Trouble relaxing 3 3 3 3   Restless 0 3 2 3   Easily annoyed or  irritable 3 3 3 3   Afraid - awful might happen 3 3 3 3   Total GAD 7 Score 18 21 19 21       CMP Latest Ref Rng & Units 10/20/2019 09/15/2019 04/02/2017  Glucose 70 - 99 mg/dL 90 92 89  BUN 6 - 23 mg/dL 7 6 5(L)  Creatinine 0.40 - 1.20 mg/dL 0.56 0.62 0.54(L)  Sodium 135 - 145 mEq/L 138 132(L) 137  Potassium 3.5 - 5.1 mEq/L 3.9 4.5 3.7  Chloride 96 - 112 mEq/L 106 100 101  CO2 19 - 32 mEq/L 24 16(L) 19(L)  Calcium 8.4 - 10.5 mg/dL 9.1 9.0 9.0  Total Protein 6.0 - 8.3 g/dL 9.0(H) 9.0(H) 9.1(H)  Total Bilirubin 0.2 - 1.2 mg/dL 0.4 0.3 0.3  Alkaline Phos 39 - 117 U/L 93 105 105  AST 0 - 37 U/L 69(H) 91(H) 105(H)  ALT 0 - 35 U/L 20 29 33(H)   Lipid Panel     Component Value Date/Time   CHOL 189 09/15/2019 0909   TRIG 97 09/15/2019 0909   HDL 60 09/15/2019 0909   CHOLHDL 3.2 09/15/2019 0909   LDLCALC 112 (H) 09/15/2019 0909    CBC    Component Value Date/Time   WBC 7.2 10/26/2019 1450   RBC 3.36 (L) 10/26/2019 1450   HGB 8.4 Repeated and verified X2. (L) 10/26/2019 1450   HGB 6.7 (LL) 09/15/2019 0909   HCT 26.5 (L) 10/26/2019 1450   HCT 23.3 (L) 09/15/2019 0909   PLT 262.0 10/26/2019 1450   PLT 365 09/15/2019 0909   MCV 78.8 10/26/2019 1450   MCV 75 (L) 09/15/2019 0909   MCH 21.6 (L) 09/15/2019 0909   MCH 21.3 (L) 06/26/2017 0815   MCHC 31.7 10/26/2019 1450   RDW 25.0 (H) 10/26/2019 1450   RDW 18.0 (H) 09/15/2019 0909   LYMPHSABS 1.8 10/26/2019 1450   MONOABS 0.8 10/26/2019 1450   EOSABS 0.0 10/26/2019 1450   BASOSABS 0.1 10/26/2019 1450  ASSESSMENT AND PLAN:  1. Moderate episode of recurrent major depressive disorder (HCC) Still active.  However her PHQ-9 score has improved slightly compared to 3 months ago. -She will continue Cymbalta. -We discussed getting her in for some counseling or to see a psychiatrist.  However she declined stating that she does not need any more doctors appointments.  She is agreeable to speaking with our LCSW.  I have sent to our LCSW  an email to give her a call.  2. Iron deficiency anemia due to chronic blood loss Advised patient that we cannot move forward with her having her knee surgery until we get this issue resolved or better.  She is not committed to taking iron consistently.  Therefore I will refer her to the hematologist. -We will refer back to gastroenterology for colonoscopy now that she is seen Dr. Dema Severin. - CBC - Ambulatory referral to Hematology  3. Primary osteoarthritis of both knees -We will refer her back to Dr. Ninfa Linden to get injections to the knees per her request - Ambulatory referral to Orthopedic Surgery  4. Alcohol abuse Strongly advised to quit.  She is not interested in doing any treatment programs.  We will have LCSW follow-up with her.  5. Anxiety Continue Cymbalta.  6. Influenza vaccine needed Given today.  In regards to her medication, I spent some time at the beginning of the visit sorting through her bottles and combining the same medicines into 1 bottle.  For example if she had 2 bottles of Norvasc, I put them all in 1 bottle.  She also had a few bottles of Celebrex which I combined into 1 bottle.  Patient was given the opportunity to ask questions.  Patient verbalized understanding of the plan and was able to repeat key elements of the plan.   No orders of the defined types were placed in this encounter.    Requested Prescriptions    No prescriptions requested or ordered in this encounter    No follow-ups on file.  Karle Plumber, MD, FACP

## 2020-02-07 ENCOUNTER — Telehealth: Payer: Self-pay | Admitting: Hematology and Oncology

## 2020-02-07 LAB — CBC
Hematocrit: 27.6 % — ABNORMAL LOW (ref 34.0–46.6)
Hemoglobin: 8.9 g/dL — ABNORMAL LOW (ref 11.1–15.9)
MCH: 25.6 pg — ABNORMAL LOW (ref 26.6–33.0)
MCHC: 32.2 g/dL (ref 31.5–35.7)
MCV: 79 fL (ref 79–97)
Platelets: 303 10*3/uL (ref 150–450)
RBC: 3.48 x10E6/uL — ABNORMAL LOW (ref 3.77–5.28)
RDW: 16.4 % — ABNORMAL HIGH (ref 11.7–15.4)
WBC: 7 10*3/uL (ref 3.4–10.8)

## 2020-02-07 NOTE — Telephone Encounter (Signed)
Received a new hem referral from Dr. Wynetta Emery for IDA. Elizabeth Bush has been cld and scheduled to see Dr. Lindi Adie on 10/7 at 815am. Transportation request will be sent for the pt to be taken to and from her appt.

## 2020-02-13 ENCOUNTER — Telehealth: Payer: Self-pay | Admitting: Internal Medicine

## 2020-02-13 NOTE — Telephone Encounter (Signed)
Copied from [place] [id number]. Topic: General - Other >> Feb 13, 2020 10:33 AM Leward Quan A wrote: Reason for CRM: Gwen with Dr Diona Browner office called to inquire if Dr Wynetta Emery can please complete and return the release form for patient to have her procedure. New form faxed over today just in case old one cannot be found. Any questions please call ph# [phone]

## 2020-02-13 NOTE — Telephone Encounter (Signed)
Dr. Wynetta Emery is out of the office till 10/4. Will give pcp form when she returns

## 2020-02-16 DIAGNOSIS — Z419 Encounter for procedure for purposes other than remedying health state, unspecified: Secondary | ICD-10-CM | POA: Diagnosis not present

## 2020-02-19 ENCOUNTER — Ambulatory Visit (INDEPENDENT_AMBULATORY_CARE_PROVIDER_SITE_OTHER): Payer: Medicaid Other | Admitting: Orthopaedic Surgery

## 2020-02-19 ENCOUNTER — Telehealth: Payer: Self-pay | Admitting: *Deleted

## 2020-02-19 DIAGNOSIS — M1712 Unilateral primary osteoarthritis, left knee: Secondary | ICD-10-CM

## 2020-02-19 DIAGNOSIS — M25562 Pain in left knee: Secondary | ICD-10-CM | POA: Diagnosis not present

## 2020-02-19 DIAGNOSIS — G8929 Other chronic pain: Secondary | ICD-10-CM | POA: Diagnosis not present

## 2020-02-19 DIAGNOSIS — M25561 Pain in right knee: Secondary | ICD-10-CM

## 2020-02-19 DIAGNOSIS — M1711 Unilateral primary osteoarthritis, right knee: Secondary | ICD-10-CM | POA: Diagnosis not present

## 2020-02-19 MED ORDER — METHYLPREDNISOLONE ACETATE 40 MG/ML IJ SUSP
40.0000 mg | INTRAMUSCULAR | Status: AC | PRN
  Administered 2020-02-19: 40 mg via INTRA_ARTICULAR

## 2020-02-19 MED ORDER — LIDOCAINE HCL 1 % IJ SOLN
3.0000 mL | INTRAMUSCULAR | Status: AC | PRN
  Administered 2020-02-19: 3 mL

## 2020-02-19 NOTE — Telephone Encounter (Signed)
Elizabeth Bush calling again regarding the clearance forms. She is requesting to have those faxed over today. Please advise.

## 2020-02-19 NOTE — Telephone Encounter (Signed)
Returned pt call and made aware that she will need to contact [facility] in regards to her incontinence supplies. Pt state she understands and I have [provided pt the number to contact

## 2020-02-19 NOTE — Progress Notes (Signed)
Office Visit Note   Patient: Elizabeth Bush           Date of Birth: [birth year]           MRN: [id number] Visit Date: 02/19/2020              Requested by: Ladell Pier, MD [address] [place],  [place] [address] PCP: Ladell Pier, MD   Assessment & Plan: Visit Diagnoses:  1. Chronic pain of left knee   2. Chronic pain of right knee   3. Unilateral primary osteoarthritis, left knee   4. Unilateral primary osteoarthritis, right knee     Plan: I did place steroid injections in both knees today which she tolerated well.  I do feel it is appropriate to get her on the schedule soon for total knee arthroplasty surgery once we are able to schedule this is an overnight stay because she would definitely need that.  The risk and benefits of surgery were explained in detail.  She understands it may be some time before we get her on the schedule due to the backlog of cases but not hopefully too long.  All questions and concerns were answered and addressed.  Follow-Up Instructions: Return for 2 weeks post-op.   Orders:  Orders Placed This Encounter  Procedures  . Large Joint Inj  . Large Joint Inj   No orders of the defined types were placed in this encounter.     Procedures: Large Joint Inj: R knee on 02/19/2020 9:36 AM Indications: diagnostic evaluation and pain Details: 22 G 1.5 in needle, superolateral approach  Arthrogram: No  Medications: 3 mL lidocaine 1 %; 40 mg methylPREDNISolone acetate 40 MG/ML Outcome: tolerated well, no immediate complications Procedure, treatment alternatives, risks and benefits explained, specific risks discussed. Consent was given by the patient. Immediately prior to procedure a time out was called to verify the correct patient, procedure, equipment, support staff and site/side marked as required. Patient was prepped and draped in the usual sterile fashion.   Large Joint Inj: L knee on 02/19/2020 9:36 AM Indications: diagnostic evaluation  and pain Details: 22 G 1.5 in needle, superolateral approach  Arthrogram: No  Medications: 3 mL lidocaine 1 %; 40 mg methylPREDNISolone acetate 40 MG/ML Outcome: tolerated well, no immediate complications Procedure, treatment alternatives, risks and benefits explained, specific risks discussed. Consent was given by the patient. Immediately prior to procedure a time out was called to verify the correct patient, procedure, equipment, support staff and site/side marked as required. Patient was prepped and draped in the usual sterile fashion.       Clinical Data: No additional findings.   Subjective: Chief Complaint  Patient presents with  . Left Knee - Pain  . Right Knee - Pain  The patient comes today for evaluation treatment of severe end-stage arthritis of both knees with significant varus deformities of both knees.  She is only 49 years old and her end-stage arthritis which is tricompartmental in both knees has been well-documented.  Her pain is severe and she does get significant swelling in both her knees.  She has also been dealing with high blood pressure but that is resolving.  She is worked on activity modification and quad strengthening exercises.  This is been going on for multiple years now.  She is not a candidate for hyaluronic acid at this point given the severity of her arthritis.  She is requesting steroid injections today and is open to get scheduled for  a left knee replacement in the near future.  Both her knees need replacing but due to the significant varus deformity out only recommend one at a time.  She denies any other acute change in medical status and reports better blood pressure control.  HPI  Review of Systems She currently denies any headache, chest pain, shortness of breath, fever, chills, nausea, vomiting  Objective: Vital Signs: There were no vitals taken for this visit.  Physical Exam She is alert and orient x3 and in no acute distress Ortho Exam   examination of both knees show significant varus malalignment and painful arc of motion of both knees.  Both knees have a mild to moderate effusion. Specialty Comments:  No specialty comments available.  Imaging: No results found. Previous films of both knees show severe end-stage arthritis with complete loss of the medial joint space due to significant varus angulation of both knees.  There are periarticular osteophytes in all 3 compartments.  PMFS History: Patient Active Problem List   Diagnosis Date Noted  . Influenza vaccine needed 02/06/2020  . Dependent for transportation 12/12/2019  . 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 Functional urinary incontinence 12/12/2019  . Rectal prolapse 10/31/2019  . Moderate episode of recurrent major depressive disorder ([place]) 10/31/2019  . Primary osteoarthritis of both knees 10/31/2019  . IDA (iron deficiency anemia) 10/30/2019  . Fibroids 10/30/2019  . Unilateral primary osteoarthritis, right knee 09/27/2019  . Alcohol abuse 11/05/2016  . Essential hypertension 11/05/2016  . Grade III hemorrhoids 11/05/2016  . GIB (gastrointestinal bleeding) 05/29/2011   Past Medical History:  Diagnosis Date  . Chlamydia   . Gonorrhea   . Hypertension     Family History  Problem Relation Age of Onset  . Diabetes Mother   . Hypertension Other   . Asthma Other     Past Surgical History:  Procedure Laterality Date  . COLONOSCOPY  05/31/2011   Procedure: COLONOSCOPY;  Surgeon: Landry Dyke, MD;  Location: [facility];  Service: Endoscopy;  Laterality: N/A;  . ESOPHAGOGASTRODUODENOSCOPY  05/30/2011   Procedure: ESOPHAGOGASTRODUODENOSCOPY (EGD);  Surgeon: Landry Dyke, MD;  Location: [facility];  Service: Endoscopy;  Laterality: N/A;  . GIVENS CAPSULE STUDY  06/01/2011   Procedure: GIVENS CAPSULE STUDY;  Surgeon: Landry Dyke, MD;  Location: [facility];  Service: Endoscopy;  Laterality: N/A;  . TONSILLECTOMY    . TUBAL LIGATION      Social History   Occupational History  . Not on file  Tobacco Use  . Smoking status: Former Smoker    Packs/day: 0.25    Years: 5.00    Pack years: 1.25    Types: Cigarettes    Quit date: 11/04/2016    Years since quitting: 3.2  . Smokeless tobacco: Never Used  Substance and Sexual Activity  . Alcohol use: Yes    Alcohol/week: 6.0 standard drinks    Types: 6 Cans of beer per week    Comment: 6 40 oz beers/day  . Drug use: No  . Sexual activity: Yes    Birth control/protection: Other-see comments, Surgical

## 2020-02-19 NOTE — Telephone Encounter (Signed)
Patient would like nurse to contact [facility] on her behalf, patient states she had difficulties speaking the representative due to a language barrier. Patient would like a follow up call [phone] when completed.

## 2020-02-19 NOTE — Telephone Encounter (Signed)
Copied from [place] [id number]. Topic: General - Inquiry >> Feb 19, 2020  1:07 PM Scherrie Gerlach wrote: Reason for CRM:  pt wants dr Wynetta Emery to know she is out of her depends- she is on the 4.  Pt states the dr orders for her.

## 2020-02-20 NOTE — Telephone Encounter (Signed)
Contacted Salsa from [facility] and per Salsa they have been trying to reach patient because pt was suppose to get a resupply on the 24th of October. Per Salsa they have been unable to reach her. Per Salsa I can give her the best contact number to reach them so that she can get her supplies   Contacted pt and made aware of information pt will reach out to [facility]

## 2020-02-21 DIAGNOSIS — R32 Unspecified urinary incontinence: Secondary | ICD-10-CM | POA: Diagnosis not present

## 2020-02-22 ENCOUNTER — Inpatient Hospital Stay: Payer: Medicaid Other | Attending: Hematology and Oncology | Admitting: Hematology and Oncology

## 2020-02-22 NOTE — Assessment & Plan Note (Deleted)
Lab review:  05/27/2011: Hemoglobin 7.3 with microcytosis 09/21/2013: Hemoglobin 7.9 11/11/2016: Hemoglobin 7.4, MCV 77 06/26/2017: Hemoglobin 5.7, MCV 71.3 (hospitalized: 4 units of PRBC felt to be due to hemorrhoids and uterine fibroids) 11/22/2017: Hemoglobin 9.7 02/07/2020: Hemoglobin 8.9, MCV 79, RDW 16.4, 10/20/2019: Serum iron 22, iron saturation 4.7%, ferritin 25.3, AST 69, total protein 9, albumin 3.6, hemoglobin 8.4, MCV 78.8  Microcytic anemia: Differential diagnosis iron deficiency anemia versus thalassemia Longstanding history of anemia Plan: 1.  Iron infusions 2. if no improvement with iron infusions she will need a bone marrow biopsy. 3.  Persistently elevated globulins: Plan to obtain serum protein electrophoresis and kappa lambda ratio  Return to clinic next week to start IV iron therapy Return to clinic in 1 month after the IV iron to recheck her labs and determine the next steps in the treatment plan.

## 2020-02-23 ENCOUNTER — Ambulatory Visit: Payer: Medicaid Other | Admitting: Internal Medicine

## 2020-02-27 ENCOUNTER — Telehealth: Payer: Self-pay | Admitting: Hematology

## 2020-02-27 NOTE — Telephone Encounter (Signed)
Elizabeth Bush has been cld and reschedule to see Dr. Irene Limbo on 11/4 at 1pm

## 2020-02-27 NOTE — Progress Notes (Signed)
02/27/2020 Elizabeth Bush [id number] April 29, 1971   Chief Complaint: Anemia, rectal bleeding   History of Present Illness: Elizabeth Bush is a 49 year old female with a past medical history of alcoholism since age 76, anxiety, depression, arthritis, hypertension, sleep apnea does not use cpap.  I saw her in the office on 10/20/2019 for further evaluation for rectal bleeding. She was assessed to have rectal prolapse and she was referred to surgeon Dr. Nadeen Landau. She was anemic at that time on Ferrous Sulfate 325mg  QD. A colonoscopy was advised after she completed her consult with Dr. Dema Severin. She presents today for further follow up. Her appointment with Dr. Dema Severin was apparently rescheduled a few times until she obtained her new Medicaid card. . She reported seeing Dr. Dema Severin in September and she was assessed to have prolapsed hemorrhoids without rectal prolapse and fecal incontinence. Per Dr. Durenda Age office note, Dr. Dema Severin did an anoscopy and decompressed 3 column internal hemorrhoids. No surgical intervention was recommended. I will request a copy of her general  surgery consult note. Dr. Dema Severin  recommended that she take fiber supplements like Metamucil or Citrucel and increase water intake. She was advised to follow up in our office to schedule a colonoscopy. Dr. Dema Severin also recommended pelvic floor physical therapy. She is having less rectal bleeding, however, she continues to have prolapsed internal hemorrhoids with moderate discomfort. She is taking Ferrous Sulfate 325mg  on po most days, she is not taking it bid as previously prescribed. She is passing a soft brown BM daily without straining. She remains on Celebrex 200mg  daily for knee pain. She has intermittent heartburn which is worse if she eats late at night or if she eats late then goes to bed. She has on and off epigastric pain. She is taking Omeprazole 20mg  QD. She remains anemic. Hg 8.4 -> 8.9.  She is drinking two 40 oz beers daily. No  other complaints today.    CBC Latest Ref Rng & Units 02/06/2020 10/26/2019 09/15/2019  WBC 3.4 - 10.8 x10E3/uL 7.0 7.2 9.4  Hemoglobin 11.1 - 15.9 g/dL 8.9(L) 8.4 Repeated and verified X2.(L) 6.7(LL)  Hematocrit 34.0 - 46.6 % 27.6(L) 26.5(L) 23.3(L)  Platelets 150 - 450 x10E3/uL 303 262.0 365    CMP Latest Ref Rng & Units 10/20/2019 09/15/2019 04/02/2017  Glucose 70 - 99 mg/dL 90 92 89  BUN 6 - 23 mg/dL 7 6 5(L)  Creatinine 0.40 - 1.20 mg/dL 0.56 0.62 0.54(L)  Sodium 135 - 145 mEq/L 138 132(L) 137  Potassium 3.5 - 5.1 mEq/L 3.9 4.5 3.7  Chloride 96 - 112 mEq/L 106 100 101  CO2 19 - 32 mEq/L 24 16(L) 19(L)  Calcium 8.4 - 10.5 mg/dL 9.1 9.0 9.0  Total Protein 6.0 - 8.3 g/dL 9.0(H) 9.0(H) 9.1(H)  Total Bilirubin 0.2 - 1.2 mg/dL 0.4 0.3 0.3  Alkaline Phos 39 - 117 U/L 93 105 105  AST 0 - 37 U/L 69(H) 91(H) 105(H)  ALT 0 - 35 U/L 20 29 33(H)   Current Outpatient Medications on File Prior to Visit  Medication Sig Dispense Refill  . amLODipine (NORVASC) 10 MG tablet Take 1 tablet (10 mg total) by mouth daily. 90 tablet 3  . celecoxib (CELEBREX) 200 MG capsule Take 1 capsule (200 mg total) by mouth daily. 30 capsule 3  . DULoxetine (CYMBALTA) 30 MG capsule Take 1 capsule (30 mg total) by mouth daily. 30 capsule 5  . ferrous sulfate 325 (65 FE) MG tablet Take  1 tablet (325 mg total) by mouth daily with breakfast. (Patient taking differently: Take 325 mg by mouth 2 (two) times daily with a meal. ) 100 tablet 3  . omeprazole (PRILOSEC) 20 MG capsule Take 1 capsule (20 mg total) by mouth daily. 30 capsule 3   No current facility-administered medications on file prior to visit.   Allergies  Allergen Reactions  . Lisinopril Swelling    Swelling of mouth/lips   Current Medications, Allergies, Past Medical History, Past Surgical History, Family History and Social History were reviewed in [employer] record.   Review of Systems:   Constitutional: Negative for fever,  sweats, chills or weight loss.  Respiratory: Negative for shortness of breath.   Cardiovascular: Negative for chest pain, palpitations and leg swelling.  Gastrointestinal: See HPI.  Musculoskeletal: + knee pain.  Neurological: Negative for dizziness, headaches or paresthesias.    Physical Exam:BP (!) 170/82   Pulse 60   Ht 5\' 7"  (1.702 m)   Wt 195 lb 9.6 oz (88.7 kg)   BMI 30.64 kg/m   General: 49 year old female in no acute distress. Head: Normocephalic and atraumatic. Eyes: No scleral icterus. Conjunctiva pink . Ears: Normal auditory acuity. Mouth: Dentition intact. No ulcers or lesions.  Lungs: Clear throughout to auscultation. Heart: Regular rate and rhythm, no murmur. Abdomen: Soft, nontender and nondistended. No masses or hepatomegaly. Normal bowel sounds x 4 quadrants.  Rectal: Less inflamed prolapsed internal hemorrhoid tissue vs rectal prolapse when compared to prior exam. No active bleeding.  Musculoskeletal: Symmetrical with no gross deformities. Extremities: No edema. Neurological: Alert oriented x 4. No focal deficits.  Psychological: Alert and cooperative. Normal mood and affect  Assessment and Recommendations: 1. Anemia secondary to chronic rectal bleeding secondary to prolapsed internal hemorrhoids and ? rectal prolapse.  -Colonoscopy benefits and risks discussed including risk with sedation, risk of bleeding, perforation and infection  -Consider internal hemorrhoid banding after colonoscopy completed  -Request copy of general surgery consult by Dr. Nadeen Landau  -Repeat CBC  -Patient instructed to apply a small amount of Vaseline to the external anal area to push prolapsed hemorrhoid tissue back inside the anus tid -Refer for rectal physical therapy   2. GERD. Chronic Celebrex use.  -EGD benefits and risks discussed including risk with sedation, risk of bleeding, perforation and infection  -Continue Omeprazole 20mg  QD  3. Alcoholism  -No alcohol  advised  -AA recommended   4. Elevated LFTs, most likely due to alcoholism  -Hep A total ab, Hep B surface ag, Hep B surface ab, Hep B core total, ANA, SMA, AMA, IGG, ceruloplasmin -An abdominal sonogram will be ordered after EGD and colonoscopy completed   Further follow up to be determined after the above evaluation completed

## 2020-02-28 ENCOUNTER — Other Ambulatory Visit (INDEPENDENT_AMBULATORY_CARE_PROVIDER_SITE_OTHER): Payer: Medicaid Other

## 2020-02-28 ENCOUNTER — Ambulatory Visit (INDEPENDENT_AMBULATORY_CARE_PROVIDER_SITE_OTHER): Payer: Medicaid Other | Admitting: Nurse Practitioner

## 2020-02-28 ENCOUNTER — Other Ambulatory Visit: Payer: Self-pay | Admitting: Nurse Practitioner

## 2020-02-28 ENCOUNTER — Encounter: Payer: Self-pay | Admitting: Nurse Practitioner

## 2020-02-28 VITALS — BP 170/82 | HR 60 | Ht 67.0 in | Wt 195.6 lb

## 2020-02-28 DIAGNOSIS — D500 Iron deficiency anemia secondary to blood loss (chronic): Secondary | ICD-10-CM

## 2020-02-28 DIAGNOSIS — R7989 Other specified abnormal findings of blood chemistry: Secondary | ICD-10-CM

## 2020-02-28 DIAGNOSIS — K219 Gastro-esophageal reflux disease without esophagitis: Secondary | ICD-10-CM | POA: Diagnosis not present

## 2020-02-28 DIAGNOSIS — K625 Hemorrhage of anus and rectum: Secondary | ICD-10-CM | POA: Diagnosis not present

## 2020-02-28 LAB — CBC
HCT: 28.3 % — ABNORMAL LOW (ref 36.0–46.0)
Hemoglobin: 9.2 g/dL — ABNORMAL LOW (ref 12.0–15.0)
MCHC: 32.6 g/dL (ref 30.0–36.0)
MCV: 78.9 fl (ref 78.0–100.0)
Platelets: 197 10*3/uL (ref 150.0–400.0)
RBC: 3.59 Mil/uL — ABNORMAL LOW (ref 3.87–5.11)
RDW: 17.9 % — ABNORMAL HIGH (ref 11.5–15.5)
WBC: 7.2 10*3/uL (ref 4.0–10.5)

## 2020-02-28 MED ORDER — PLENVU 140 G PO SOLR: 1.0000 | ORAL | 0 refills | Status: DC

## 2020-02-28 MED FILL — PLENVU 140 GM SOLR: 140 | 1 days supply | Qty: 3 | Fill #0

## 2020-02-28 NOTE — Patient Instructions (Signed)
If you are age 50 or older, your body mass index should be between 23-30. Your Body mass index is 30.64 kg/m. If this is out of the aforementioned range listed, please consider follow up with your Primary Care Provider.  If you are age 84 or younger, your body mass index should be between 19-25. Your Body mass index is 30.64 kg/m. If this is out of the aformentioned range listed, please consider follow up with your Primary Care Provider.   Your provider has requested that you go to the basement level for lab work before leaving today. Press "B" on the elevator. The lab is located at the first door on the left as you exit the elevator.  You have been scheduled for an endoscopy and colonoscopy. Please follow the written instructions given to you at your visit today. Please pick up your prep supplies at the pharmacy within the next 1-3 days. If you use inhalers (even only as needed), please bring them with you on the day of your procedure.  Continue Omeprazole and Ferous Sulfate (Iron)  We will be referring you for Rectal Prolapse therapy.  Use Vaseline to push the prolapse the tissue back inside the anus.  We will contact you about your labs results. Follow up is pending the results of your labs, Colonoscopy and Endoscopy.

## 2020-03-02 LAB — ANTI-NUCLEAR AB-TITER (ANA TITER)
ANA TITER: 1:640 {titer} — ABNORMAL HIGH
ANA TITER: 1:80 {titer} — ABNORMAL HIGH
ANA Titer 1: 1:80 {titer} — ABNORMAL HIGH

## 2020-03-02 LAB — ANTI-SMOOTH MUSCLE ANTIBODY, IGG: Actin (Smooth Muscle) Antibody (IGG): <20 U (ref <20)

## 2020-03-02 LAB — CERULOPLASMIN: Ceruloplasmin: 37 mg/dL (ref 18–53)

## 2020-03-02 LAB — HEPATITIS B SURFACE ANTIBODY,QUALITATIVE: Hep B S Ab: NONREACTIVE

## 2020-03-02 LAB — IGG: IgG (Immunoglobin G), Serum: 2044 mg/dL — ABNORMAL HIGH (ref 600–1640)

## 2020-03-02 LAB — HEPATITIS A ANTIBODY, IGM: Hep A IgM: NONREACTIVE

## 2020-03-02 LAB — MITOCHONDRIAL ANTIBODIES: Mitochondrial M2 Ab, IgG: <=20 U

## 2020-03-02 LAB — HEPATITIS B SURFACE ANTIGEN: Hepatitis B Surface Ag: NONREACTIVE

## 2020-03-02 LAB — HEPATITIS C ANTIBODY
Hepatitis C Ab: NONREACTIVE
SIGNAL TO CUT-OFF: 0.04 (ref <1.00)

## 2020-03-02 LAB — HEPATITIS B CORE ANTIBODY, TOTAL: Hep B Core Total Ab: NONREACTIVE

## 2020-03-02 LAB — ALPHA-1-ANTITRYPSIN: A-1 Antitrypsin, Ser: 197 mg/dL (ref 83–199)

## 2020-03-02 LAB — ANA: Anti Nuclear Antibody (ANA): POSITIVE — AB

## 2020-03-04 ENCOUNTER — Ambulatory Visit: Payer: Medicaid Other | Admitting: Family

## 2020-03-04 NOTE — Progress Notes (Signed)
Agree with the assessment and plan as outlined by Colleen Kennedy-Smith, NP.   Haili Donofrio, DO, FACG [facility]   

## 2020-03-11 ENCOUNTER — Other Ambulatory Visit: Payer: Self-pay | Admitting: Internal Medicine

## 2020-03-11 ENCOUNTER — Other Ambulatory Visit: Payer: Self-pay

## 2020-03-11 DIAGNOSIS — F331 Major depressive disorder, recurrent, moderate: Secondary | ICD-10-CM

## 2020-03-11 DIAGNOSIS — R7989 Other specified abnormal findings of blood chemistry: Secondary | ICD-10-CM

## 2020-03-11 MED FILL — AMLODIPINE BESYLATE 10 MG T: 10 | 30 days supply | Qty: 30 | Fill #2

## 2020-03-11 MED FILL — DULoxetine HCL 30 MG CPEP: 30 | 30 days supply | Qty: 30 | Fill #2

## 2020-03-11 NOTE — Telephone Encounter (Signed)
Medication: DULoxetine (CYMBALTA) 30 MG capsule [[id number]] ,   Has the patient contacted their pharmacy? YES (Agent: If no, request that the patient contact the pharmacy for the refill.) (Agent: If yes, when and what did the pharmacy advise?)  Preferred Pharmacy (with phone number or street name): [place], [place] [address]  [place], [place] [address]  Phone:  [phone] Fax:  [phone]   Agent: Please be advised that RX refills may take up to 3 business days. We ask that you follow-up with your pharmacy.

## 2020-03-13 ENCOUNTER — Other Ambulatory Visit: Payer: Medicaid Other

## 2020-03-13 DIAGNOSIS — R7989 Other specified abnormal findings of blood chemistry: Secondary | ICD-10-CM | POA: Diagnosis not present

## 2020-03-15 LAB — ANTI-MICROSOMAL ANTIBODY LIVER / KIDNEY: LKM1 Ab: <=20 U (ref <=20.0)

## 2020-03-18 DIAGNOSIS — Z419 Encounter for procedure for purposes other than remedying health state, unspecified: Secondary | ICD-10-CM | POA: Diagnosis not present

## 2020-03-21 ENCOUNTER — Telehealth: Payer: Self-pay | Admitting: Hematology

## 2020-03-21 ENCOUNTER — Inpatient Hospital Stay: Payer: Medicaid Other | Admitting: Hematology

## 2020-03-21 NOTE — Telephone Encounter (Signed)
Elizabeth Bush cld to reschedule her new hem appt to 11/15 at 240pm.

## 2020-03-27 ENCOUNTER — Telehealth: Payer: Self-pay | Admitting: Internal Medicine

## 2020-03-27 NOTE — Telephone Encounter (Signed)
°   Elizabeth Bush DOB: 1971-05-11 MRN: [id number]   RIDER WAIVER AND RELEASE OF LIABILITY  For purposes of improving physical access to our facilities, [facility] is pleased to partner with third parties to provide [facility] patients or other authorized individuals the option of convenient, on-demand ground transportation services (the [employer]) through use of the technology service that enables users to request on-demand ground transportation from independent third-party providers.  By opting to use and accept these [employer], I, the undersigned, hereby agree on behalf of myself, and on behalf of any minor child using the [employer] for whom I am the parent or legal guardian, as follows:  1. Government social research officer provided to me are provided by independent third-party transportation providers who are not [employer] or employees and who are unaffiliated with [employer]. 2. [facility] is neither a transportation carrier nor a common or public carrier. 3. [facility] has no control over the quality or safety of the transportation that occurs as a result of the [employer]. 4. [facility] cannot guarantee that any third-party transportation provider will complete any arranged transportation service. 5. [facility] makes no representation, warranty, or guarantee regarding the reliability, timeliness, quality, safety, suitability, or availability of any of the Transport Services or that they will be error free. 6. I fully understand that traveling by vehicle involves risks and dangers of serious bodily injury, including permanent disability, paralysis, and death. I agree, on behalf of myself and on behalf of any minor child using the Transport Services for whom I am the parent or legal guardian, that the entire risk arising out of my use of the [employer] remains solely with me, to the maximum extent permitted under applicable law. 7. The [employer]  are provided as is and as available. [facility] disclaims all representations and warranties, express, implied or statutory, not expressly set out in these terms, including the implied warranties of merchantability and fitness for a particular purpose. 8. I hereby waive and release [facility], its agents, employees, officers, directors, representatives, insurers, attorneys, assigns, successors, subsidiaries, and affiliates from any and all past, present, or future claims, demands, liabilities, actions, causes of action, or suits of any kind directly or indirectly arising from acceptance and use of the [employer]. 9. I further waive and release [facility] and its affiliates from all present and future liability and responsibility for any injury or death to persons or damages to property caused by or related to the use of the [employer]. 10. I have read this Waiver and Release of Liability, and I understand the terms used in it and their legal significance. This Waiver is freely and voluntarily given with the understanding that my right (as well as the right of any minor child for whom I am the parent or legal guardian using the [employer]) to legal recourse against [facility] in connection with the [employer] is knowingly surrendered in return for use of these services.   I attest that I read the consent document to Elizabeth Bush, gave Elizabeth Bush the opportunity to ask questions and answered the questions asked (if any). I affirm that Elizabeth Bush then provided consent for she's participation in this program.     Elizabeth Bush

## 2020-03-28 ENCOUNTER — Encounter: Payer: Self-pay | Admitting: Physical Therapy

## 2020-03-28 ENCOUNTER — Ambulatory Visit: Payer: Medicaid Other | Attending: Surgery | Admitting: Physical Therapy

## 2020-03-28 ENCOUNTER — Other Ambulatory Visit: Payer: Self-pay

## 2020-03-28 DIAGNOSIS — M6281 Muscle weakness (generalized): Secondary | ICD-10-CM

## 2020-03-28 DIAGNOSIS — R32 Unspecified urinary incontinence: Secondary | ICD-10-CM | POA: Diagnosis not present

## 2020-03-28 DIAGNOSIS — R278 Other lack of coordination: Secondary | ICD-10-CM | POA: Diagnosis not present

## 2020-03-28 DIAGNOSIS — R159 Full incontinence of feces: Secondary | ICD-10-CM

## 2020-03-28 NOTE — Patient Instructions (Addendum)
Check on medical insurance to see if covers a fiber supplement MD wants you to reduce being on the commode to 2-3 minutes Drink 64 ounces of water per day Using the Metamucil/cirucel/fiberCon/Benefiber or Konsyl daily   Call Dr. Ninfa Linden about surgery for knees [facility] [address], [place], [place] [address] Phone # [phone] Fax [phone]

## 2020-03-28 NOTE — Therapy (Signed)
[facility] [address], [place], [place], [address] Phone: [phone]   Fax:  [phone]  Physical Therapy Evaluation  Patient Details  Name: Elizabeth Bush MRN: [id number] Date of Birth: [birth year] Referring Provider (PT): Dr. Nadeen Landau   Encounter Date: 03/28/2020   PT End of Session - 03/28/20 1226    Visit Number 1    Date for PT Re-Evaluation 05/23/20    Authorization Type [employer]    PT Start Time 7564    PT Stop Time 1100    PT Time Calculation (min) 45 min    Activity Tolerance Patient tolerated treatment well;Patient limited by pain   increase in knee pain   Behavior During Therapy [facility] for tasks assessed/performed           Past Medical History:  Diagnosis Date  . Chlamydia   . Gonorrhea   . Hypertension     Past Surgical History:  Procedure Laterality Date  . COLONOSCOPY  05/31/2011   Procedure: COLONOSCOPY;  Surgeon: Landry Dyke, MD;  Location: [facility] ENDOSCOPY;  Service: Endoscopy;  Laterality: N/A;  . ESOPHAGOGASTRODUODENOSCOPY  05/30/2011   Procedure: ESOPHAGOGASTRODUODENOSCOPY (EGD);  Surgeon: Landry Dyke, MD;  Location: [facility];  Service: Endoscopy;  Laterality: N/A;  . GIVENS CAPSULE STUDY  06/01/2011   Procedure: GIVENS CAPSULE STUDY;  Surgeon: Landry Dyke, MD;  Location: [facility] ENDOSCOPY;  Service: Endoscopy;  Laterality: N/A;  . TONSILLECTOMY    . TUBAL LIGATION      There were no vitals filed for this visit.    Subjective Assessment - 03/28/20 1022    Subjective Patient is having difficulty with her hemorroids for several years. Patient reporst she was not able to get fiber supplements due to finances and has trouble purchasing food due to finances.    Patient Stated Goals reduce stool leakage; urine leakage, reduce straining with bowel movement    Currently in Pain? Yes    Pain Score 7     Pain Location Rectum    Pain Orientation Mid    Pain Descriptors /  Indicators --   itchy   Pain Type Chronic pain    Pain Onset More than a month ago    Pain Frequency Intermittent    Aggravating Factors  using the bathroom    Pain Relieving Factors not using the bathroom              [facility] PT Assessment - 03/28/20 0001      Assessment   Medical Diagnosis K64.9 Hemorrhoid    Referring Provider (PT) Dr. Nadeen Landau    Onset Date/Surgical Date --   chronic several years      Precautions   Precautions None      Restrictions   Weight Bearing Restrictions No      Balance Screen   Has the patient fallen in the past 6 months No    Has the patient had a decrease in activity level because of a fear of falling?  No    Is the patient reluctant to leave their home because of a fear of falling?  No      Home Ecologist residence      Prior Function   Level of Independence Independent      Cognition   Overall Cognitive Status Within Functional Limits for tasks assessed      Observation/Other Assessments   Focus on Therapeutic Outcomes (FOTO)  PFIQ-7 276;  UIQ-7 90; CRAIQ-7 95; POPIQ-7 90      Posture/Postural Control   Posture/Postural Control Postural limitations    Postural Limitations Rounded Shoulders;Forward head    Posture Comments valgus of knees, walks with a cane      ROM / Strength   AROM / PROM / Strength AROM;PROM;Strength      Strength   Overall Strength Comments abdominal strength is 1/5; trouble contracting the abdomen and will bulge out      Palpation   Palpation comment tenderness throughout the abdomen, decreased lower rib cage movement      Ambulation/Gait   Ambulation/Gait Yes    Ambulation/Gait Assistance 6: Modified independent (Device/Increase time)    Assistive device Straight cane    Gait Pattern Decreased step length - right;Decreased step length - left;Right circumduction;Left circumduction   bilateral knee valgus                     Objective measurements  completed on examination: See above findings.     Pelvic Floor Special Questions - 03/28/20 0001    Prior Pregnancies Yes    Number of Pregnancies 4   no tearing   Urinary Leakage Yes    Pad use 6-7 depends per day    Activities that cause leaking With strong urge;Laughing;Coughing;Sneezing;Walking   both fecal and urine leakage   Urinary urgency Yes   not able to make it   Urinary frequency every 15 minutes and depends on what she is drinking    Fecal incontinence Yes   Type 7   External Perineal Exam hemorroid is external and red    Skin Integrity Intact;Hemorroids    Prolapse Anterior Wall    Pelvic Floor Internal Exam Patient confims identification and approves PT to assess pelvic floor and treatment    Exam Type Vaginal    Palpation tenderness located in the levator ani and obturator internist, decreased contraction of the sides of the introitus    Strength weak squeeze, no lift    Strength # of reps 2    Tone increased                    PT Education - 03/28/20 1225    Education Details educated patient on doctors plan for her to take fiber to bulk up her stools and drink more water    Person(s) Educated Patient    Methods Explanation    Comprehension Verbalized understanding            PT Short Term Goals - 03/28/20 1230      PT SHORT TERM GOAL #1   Title independent with initial HEP    Baseline not educated yet    Time 4    Period Weeks    Status New    Target Date 04/25/20      PT SHORT TERM GOAL #2   Title instructed on vaginal and rectal care to reduce dryness and irritation    Time 4    Period Weeks    Status New    Target Date 04/25/20             PT Long Term Goals - 03/28/20 1422      PT LONG TERM GOAL #1   Title independent with advanced HEP for pelvic floor strength    Baseline not educated yet    Time 8    Period Weeks    Status New    Target Date 05/23/20  PT LONG TERM GOAL #2   Title able to contract the pelvic floor  >/= 3/5 with a circular contraction to reduce urinary leakage >/= 50%    Baseline pelvic floor strength anterior and posterior is 2/5 and laterally is 1/5    Time 8    Period Weeks    Status New    Target Date 05/23/20      PT LONG TERM GOAL #3   Title able to wear </= 3 depends per day due to reduction of stool and urine leakage and improved pelvic floor strength    Baseline wears 6-7 depends per day    Time 8    Period Weeks    Status New    Target Date 05/23/20      PT LONG TERM GOAL #4   Title able to move in bed with decreased strainin on the pelvic floor due to correctly breathing    Baseline holds breath while moving in bed putting pressure on the pelvic floor    Time 8    Period Weeks    Status New    Target Date 05/23/20                  Plan - 03/28/20 1413    Clinical Impression Statement Patient is a 49 year old female with Hemorroids for several years. Patient reports her rectal pain is 7/10 when using the commode. It is difficult for her to walk to the commode due to bilateral knee pain and the difficulty to walk due to reduced cartlage and need for a TKR. Patient will leak urine and stool with exertion, cough, sneeze, laugh, bending and moving in bed. Patient does not leave her home due to the knee pain and leakage. Patient wears 6-7 depends per day. Pelvic floor strength vaginally is 2/5 anterior and posteriorly and 1/5 laterally. She has tenderness located in the levator ani and obturator internist and suprapubically. Patient hemorroid is outside the rectum with stool around it. Patient reports food insecurities and not able to purchase the fiber supplements due to finances. Patient will benefit from skilled therapy to improve pelvic floor strength and coordination to reduce her leakage.    Personal Factors and Comorbidities Age;Fitness;Past/Current Experience;Social Background;Comorbidity 1;Transportation;Finances;Education    Comorbidities severe knee arthritis     Examination-Activity Limitations Locomotion Level;Continence;Dressing;Toileting;Bed Mobility;Bend;Transfers    Examination-Participation Restrictions Cleaning;Community Activity;Shop;Personal Finances    Stability/Clinical Decision Making Evolving/Moderate complexity    Clinical Decision Making Moderate    Rehab Potential Good    PT Frequency 1x / week    PT Duration 8 weeks    PT Treatment/Interventions ADLs/Self Care Home Management;Biofeedback;Cryotherapy;Electrical Stimulation;Moist Heat;Neuromuscular re-education;Therapeutic exercise;Therapeutic activities;Gait training;Patient/family education;Manual techniques;Energy conservation;Joint Manipulations    PT Next Visit Plan pressure management with correct breath when moving in bed, bowel health, toileting, pelvic floor EMG, vaginal and rectal skin care    Consulted and Agree with Plan of Care Patient           Patient will benefit from skilled therapeutic intervention in order to improve the following deficits and impairments:  Abnormal gait, Decreased coordination, Increased fascial restricitons, Decreased endurance, Decreased activity tolerance, Pain, Increased muscle spasms, Decreased strength  Visit Diagnosis: Muscle weakness (generalized) - Plan: PT plan of care cert/re-cert  Other lack of coordination - Plan: PT plan of care cert/re-cert  Incontinence of feces, unspecified fecal incontinence type - Plan: PT plan of care cert/re-cert  Urinary incontinence, unspecified type - Plan: PT plan of care cert/re-cert  Problem List Patient Active Problem List   Diagnosis Date Noted  . Influenza vaccine needed 02/06/2020  . Dependent for transportation 12/12/2019  . 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 Functional urinary incontinence 12/12/2019  . Rectal prolapse 10/31/2019  . Moderate episode of recurrent major depressive disorder ([place]) 10/31/2019  . Primary osteoarthritis of both knees 10/31/2019  . IDA (iron  deficiency anemia) 10/30/2019  . Fibroids 10/30/2019  . Unilateral primary osteoarthritis, right knee 09/27/2019  . Alcohol abuse 11/05/2016  . Essential hypertension 11/05/2016  . Grade III hemorrhoids 11/05/2016  . GIB (gastrointestinal bleeding) 05/29/2011    Earlie Counts, PT 03/28/20 2:35 PM   [facility] [address] [place], [place], [address] Phone: [phone]   Fax:  [phone]  Name: Elizabeth Bush MRN: [id number] Date of Birth: [birth year]

## 2020-04-01 ENCOUNTER — Other Ambulatory Visit: Payer: Self-pay

## 2020-04-01 ENCOUNTER — Inpatient Hospital Stay: Payer: Medicaid Other

## 2020-04-01 ENCOUNTER — Inpatient Hospital Stay: Payer: Medicaid Other | Attending: Hematology and Oncology | Admitting: Hematology

## 2020-04-01 ENCOUNTER — Telehealth: Payer: Self-pay | Admitting: Hematology

## 2020-04-01 VITALS — BP 135/95 | HR 88 | Temp 96.8°F | Resp 18 | Ht 67.0 in | Wt 199.6 lb

## 2020-04-01 DIAGNOSIS — Z79899 Other long term (current) drug therapy: Secondary | ICD-10-CM | POA: Diagnosis not present

## 2020-04-01 DIAGNOSIS — D500 Iron deficiency anemia secondary to blood loss (chronic): Secondary | ICD-10-CM | POA: Insufficient documentation

## 2020-04-01 DIAGNOSIS — F17210 Nicotine dependence, cigarettes, uncomplicated: Secondary | ICD-10-CM | POA: Diagnosis not present

## 2020-04-01 DIAGNOSIS — D509 Iron deficiency anemia, unspecified: Secondary | ICD-10-CM | POA: Diagnosis not present

## 2020-04-01 DIAGNOSIS — M25569 Pain in unspecified knee: Secondary | ICD-10-CM | POA: Diagnosis not present

## 2020-04-01 DIAGNOSIS — K922 Gastrointestinal hemorrhage, unspecified: Secondary | ICD-10-CM | POA: Diagnosis not present

## 2020-04-01 LAB — CBC WITH DIFFERENTIAL/PLATELET
Abs Immature Granulocytes: 0.01 K/uL (ref 0.00–0.07)
Basophils Absolute: 0.1 K/uL (ref 0.0–0.1)
Basophils Relative: 2 %
Eosinophils Absolute: 0.1 K/uL (ref 0.0–0.5)
Eosinophils Relative: 2 %
HCT: 28 % — ABNORMAL LOW (ref 36.0–46.0)
Hemoglobin: 8.8 g/dL — ABNORMAL LOW (ref 12.0–15.0)
Immature Granulocytes: 0 %
Lymphocytes Relative: 57 %
Lymphs Abs: 3.2 K/uL (ref 0.7–4.0)
MCH: 25.1 pg — ABNORMAL LOW (ref 26.0–34.0)
MCHC: 31.4 g/dL (ref 30.0–36.0)
MCV: 79.8 fL — ABNORMAL LOW (ref 80.0–100.0)
Monocytes Absolute: 0.8 K/uL (ref 0.1–1.0)
Monocytes Relative: 14 %
Neutro Abs: 1.4 K/uL — ABNORMAL LOW (ref 1.7–7.7)
Neutrophils Relative %: 25 %
Platelets: 332 K/uL (ref 150–400)
RBC: 3.51 MIL/uL — ABNORMAL LOW (ref 3.87–5.11)
RDW: 17.8 % — ABNORMAL HIGH (ref 11.5–15.5)
WBC: 5.6 K/uL (ref 4.0–10.5)
nRBC: 0 % (ref 0.0–0.2)

## 2020-04-01 LAB — IRON AND TIBC
Iron: 12 ug/dL — ABNORMAL LOW (ref 41–142)
Saturation Ratios: 3 % — ABNORMAL LOW (ref 21–57)
TIBC: 405 ug/dL (ref 236–444)
UIBC: 393 ug/dL — ABNORMAL HIGH (ref 120–384)

## 2020-04-01 LAB — CMP (CANCER CENTER ONLY)
ALT: 55 U/L — ABNORMAL HIGH (ref 0–44)
AST: 145 U/L — ABNORMAL HIGH (ref 15–41)
Albumin: 3.2 g/dL — ABNORMAL LOW (ref 3.5–5.0)
Alkaline Phosphatase: 93 U/L (ref 38–126)
Anion gap: 9 (ref 5–15)
BUN: 5 mg/dL — ABNORMAL LOW (ref 6–20)
CO2: 24 mmol/L (ref 22–32)
Calcium: 8.8 mg/dL — ABNORMAL LOW (ref 8.9–10.3)
Chloride: 110 mmol/L (ref 98–111)
Creatinine: 0.69 mg/dL (ref 0.44–1.00)
GFR, Estimated: >60 mL/min (ref >60)
Glucose, Bld: 98 mg/dL (ref 70–99)
Potassium: 3.8 mmol/L (ref 3.5–5.1)
Sodium: 143 mmol/L (ref 135–145)
Total Bilirubin: 0.3 mg/dL (ref 0.3–1.2)
Total Protein: 8.6 g/dL — ABNORMAL HIGH (ref 6.5–8.1)

## 2020-04-01 LAB — FERRITIN: Ferritin: 23 ng/mL (ref 11–307)

## 2020-04-01 NOTE — Patient Instructions (Signed)
Thank you for choosing [facility] to provide your oncology and hematology care.   Should you have questions after your visit to the [facility] ([facility]), please contact this office at [phone] between 8:30 AM and 4:30 PM.  Voice mails left after 4:00 PM may not be returned until the following business day.  Calls received after 4:30 PM will be answered by an off-site Nurse Triage Line.    Prescription Refills:  Please have your pharmacy contact us directly for most prescription requests.  Contact the office directly for refills of narcotics (pain medications). Allow 48-72 hours for refills.  Appointments: Please contact the [facility] scheduling department [phone] for questions regarding [facility] appointment scheduling.  Contact the schedulers with any scheduling changes so that your appointment can be rescheduled in a timely manner.   Central Scheduling for [place] [phone] - Call to schedule procedures such as PET scans, CT scans, MRI, Ultrasound, etc.  To afford each patient quality time with our providers, please arrive 30 minutes before your scheduled appointment time.  If you arrive late for your appointment, you may be asked to reschedule.  We strive to give you quality time with our providers, and arriving late affects you and other patients whose appointments are after yours. If you are a no show for multiple scheduled visits, you may be dismissed from the clinic at the providers discretion.     Resources: [facility] Social Workers [phone] for additional information on assistance programs or assistance connecting with community support programs   [place] DSS  [phone]: Information regarding food stamps, Medicaid, and utility assistance [place] [employer] [place] [phone]   [place] [employer]'s shared-ride transportation service for eligible riders who have a disability that prevents them from riding the fixed route bus.   Medicare  Rights Center 800-333-4114 Helps people with Medicare understand their rights and benefits, navigate the Medicare system, and secure the quality healthcare they deserve [employer] 800-227-2345 Assists patients locate various types of support and financial assistance Cancer Care: 1-800-813-HOPE (4673) Provides financial assistance, online support groups, medication/co-pay assistance.   Transportation Assistance for appointments at [facility]: Transportation Coordinator [phone]  Again, thank you for choosing [facility] for your care.       

## 2020-04-01 NOTE — Progress Notes (Signed)
HEMATOLOGY/ONCOLOGY CONSULTATION NOTE  Date of Service: 04/01/2020  Patient Care Team: Ladell Pier, MD as PCP - General (Internal Medicine)  CHIEF COMPLAINTS/PURPOSE OF CONSULTATION:  IDA  HISTORY OF PRESENTING ILLNESS:   Elizabeth Bush is a wonderful 49 y.o. female who has been referred to [place] by Dr. Wynetta Emery for evaluation and management of iron deficiency anemia. The pt reports that she is doing well overall.   The pt reports that she is currently following with Gastroenterology, who is conducting a GI workup for her ongoing rectal bleeding. She has Colonoscopy and Endoscopy scheduled for 04/18/20. Pt is following with Surgery for hemorrhoids and rectal prolapse. Pt is still experiencing rectal bleeding about three times per week. She denies black stools, but is able to visualize dark red clots in her stool. She notes more rectal bleeding when she eats more food. Pt has been on 200 mg Celebrex daily to address her knee pain for nearly two months. She was placed on PO Iron a few months ago. Pt was told to take Ferrous Sulfate twice per day, but has been taking it once every few days. Pt denies any abdominal discomfort or constipation with PO Iron.   She has a Total Knee Arthroplasty scheduled for 05/03/20. 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    She has no known food or medication allergies.   Most recent lab results (02/28/2020) of CBC is as follows: all values are WNL except for RBC at 3.59, Hgb at 9.2, HCT at 28.3, RDW at 17.9. 10/20/2019 Iron at 22, Transferrin at 334.0, Sat Ratios at 4.7, Ferritin at 25.3.  On review of systems, pt reports lower abdominal pain, bloody stools, joint pain and denies fevers, chills, night sweats, unexpected weight loss, nose bleeds, gum bleeds, melena and any other symptoms.   On PMHx the pt reports Arthritis, Hypertension. On Social Hx the pt reports that she is currently drinking 80 oz of beer per day. She has a history of heavier  alcohol use.    MEDICAL HISTORY:  Past Medical History:  Diagnosis Date  . Chlamydia   . Gonorrhea   . Hypertension     SURGICAL HISTORY: Past Surgical History:  Procedure Laterality Date  . COLONOSCOPY  05/31/2011   Procedure: COLONOSCOPY;  Surgeon: Landry Dyke, MD;  Location: [facility] END[facility]COPY;  Service: Endoscopy;  Laterality: N/A;  . ESOPHAGOGASTRODUODENOSCOPY  05/30/2011   Procedure: ESOPHAGOGASTRODUODENOSCOPY (EGD);  Surgeon: Landry Dyke, MD;  Location: [facility];  Service: Endoscopy;  Laterality: N/A;  . GIVENS CAPSULE STUDY  06/01/2011   Procedure: GIVENS CAPSULE STUDY;  Surgeon: Landry Dyke, MD;  Location: [facility] ENDOSCOPY;  Service: Endoscopy;  Laterality: N/A;  . TONSILLECTOMY    . TUBAL LIGATION      SOCIAL HISTORY: Social History   Socioeconomic History  . Marital status: Single    Spouse name: Not on file  . Number of children: Not on file  . Years of education: Not on file  . Highest education level: Not on file  Occupational History  . Not on file  Tobacco Use  . Smoking status: Current Some Day Smoker    Packs/day: 0.25    Years: 5.00    Pack years: 1.25    Types: Cigarettes    Last attempt to quit: 11/04/2016    Years since quitting: 3.4  . Smokeless tobacco: Never Used  Substance and Sexual Activity  . Alcohol use: Yes    Alcohol/week: 6.0 standard drinks  Types: 6 Cans of beer per week    Comment: 6 40 oz beers/day  . Drug use: No  . Sexual activity: Yes    Birth control/protection: Other-see comments, Surgical  Other Topics Concern  . Not on file  Social History Narrative  . Not on file   Social Determinants of Health   Financial Resource Strain:   . Difficulty of Paying Living Expenses: Not on file  Food Insecurity: Food Insecurity Present  . Worried About Charity fundraiser in the Last Year: Often true  . Ran Out of Food in the Last Year: Often true  Transportation Needs: Unmet Transportation Needs  . Lack of  Transportation (Medical): Yes  . Lack of Transportation (Non-Medical): Yes  Physical Activity:   . Days of Exercise per Week: Not on file  . Minutes of Exercise per Session: Not on file  Stress:   . Feeling of Stress : Not on file  Social Connections:   . Frequency of Communication with Friends and Family: Not on file  . Frequency of Social Gatherings with Friends and Family: Not on file  . Attends Religious Services: Not on file  . Active Member of Clubs or Organizations: Not on file  . Attends Archivist Meetings: Not on file  . Marital Status: Not on file  Intimate Partner Violence:   . Fear of Current or Ex-Partner: Not on file  . Emotionally Abused: Not on file  . Physically Abused: Not on file  . Sexually Abused: Not on file    FAMILY HISTORY: Family History  Problem Relation Age of Onset  . Diabetes Mother   . Hypertension Other   . Asthma Other     ALLERGIES:  is allergic to lisinopril.  MEDICATIONS:  Current Outpatient Medications  Medication Sig Dispense Refill  . amLODipine (NORVASC) 10 MG tablet Take 1 tablet (10 mg total) by mouth daily. 90 tablet 3  . celecoxib (CELEBREX) 200 MG capsule Take 1 capsule (200 mg total) by mouth daily. 30 capsule 3  . DULoxetine (CYMBALTA) 30 MG capsule Take 1 capsule (30 mg total) by mouth daily. 30 capsule 5  . ferrous sulfate 325 (65 FE) MG tablet Take 1 tablet (325 mg total) by mouth daily with breakfast. (Patient taking differently: Take 325 mg by mouth 2 (two) times daily with a meal. ) 100 tablet 3  . omeprazole (PRILOSEC) 20 MG capsule Take 1 capsule (20 mg total) by mouth daily. 30 capsule 3  . PEG-KCl-NaCl-NaSulf-Na Asc-C (PLENVU) 140 g SOLR Take 1 kit by mouth as directed. Manufacturer's coupon Universal coupon code:BIN: P2366821; GROUP: [id number]; PCN: [id number]; ID: [id number]; PAY NO MORE $50 1 each 0   No current facility-administered medications for this visit.    REVIEW OF SYSTEMS:    10 Point review of  Systems was done is negative except as noted above.  PHYSICAL EXAMINATION: ECOG PERFORMANCE STATUS: 2 - Symptomatic, <50% confined to bed  . Vitals:   04/01/20 1133  BP: (!) 135/95  Pulse: 88  Resp: 18  Temp: (!) 96.8 F (36 C)  SpO2: 98%   Filed Weights   04/01/20 1133  Weight: 199 lb 9.6 oz (90.5 kg)   .Body mass index is 31.26 kg/m.  Exam was given in a wheelchair   GENERAL:alert, in no acute distress and comfortable SKIN: no acute rashes, no significant lesions EYES: conjunctiva are pink and non-injected, sclera anicteric OROPHARYNX: MMM, no exudates, no oropharyngeal erythema or ulceration NECK: supple, no JVD  LYMPH:  no palpable lymphadenopathy in the cervical, axillary or inguinal regions LUNGS: clear to auscultation b/l with normal respiratory effort HEART: regular rate & rhythm ABDOMEN:  normoactive bowel sounds , non tender, not distended. Extremity: no pedal edema PSYCH: alert & oriented x 3 with fluent speech NEURO: no focal motor/sensory deficits  LABORATORY DATA:  I have reviewed the data as listed  . CBC Latest Ref Rng & Units 04/01/2020 02/28/2020 02/06/2020  WBC 4.0 - 10.5 K/uL 5.6 7.2 7.0  Hemoglobin 12.0 - 15.0 g/dL 8.8(L) 9.2 Repeated and verified X2.(L) 8.9(L)  Hematocrit 36 - 46 % 28.0(L) 28.3(L) 27.6(L)  Platelets 150 - 400 K/uL 332 197.0 303    . CMP Latest Ref Rng & Units 04/01/2020 10/20/2019 09/15/2019  Glucose 70 - 99 mg/dL 98 90 92  BUN 6 - 20 mg/dL 5(L) 7 6  Creatinine 0.44 - 1.00 mg/dL 0.69 0.56 0.62  Sodium 135 - 145 mmol/L 143 138 132(L)  Potassium 3.5 - 5.1 mmol/L 3.8 3.9 4.5  Chloride 98 - 111 mmol/L 110 106 100  CO2 22 - 32 mmol/L 24 24 16(L)  Calcium 8.9 - 10.3 mg/dL 8.8(L) 9.1 9.0  Total Protein 6.5 - 8.1 g/dL 8.6(H) 9.0(H) 9.0(H)  Total Bilirubin 0.3 - 1.2 mg/dL 0.3 0.4 0.3  Alkaline Phos 38 - 126 U/L 93 93 105  AST 15 - 41 U/L 145(H) 69(H) 91(H)  ALT 0 - 44 U/L 55(H) 20 29     RADIOGRAPHIC STUDIES: I have  personally reviewed the radiological images as listed and agreed with the findings in the report. No results found.  ASSESSMENT & PLAN:   49 yo with   1) Iron deficiency Anemia likely due to chronic GI bleeding. PLAN: -Discussed patient's most recent labs from 02/28/2020, all values are WNL except for RBC at 3.59, Hgb at 9.2, HCT at 28.3, RDW at 17.9. -Discussed 10/20/2019 Iron at 22, Transferrin at 334.0, Sat Ratios at 4.7, Ferritin at 25.3. -Advised pt that her blood counts would need to be stable for surgery.  -Advised pt that due to her inability to take PO Iron regularly and upcoming surgery would recommend IV Iron to improve iron levels quickly. -Advised pt that there is a chance of allergic reaction with IV Iron, but we would premedicate to prevent this.  -Advised pt that it can take 4-8 weeks to see the benefits of IV Iron. -Pt continues to drink excessively. Recommend complete alcohol cessation. -f/u for GI workup with gastroenterologist. -Will get labs today  -Will give IV Injectafer weekly x2 ASAP -Will see back in 3 months with labs    FOLLOW UP: Labs today IV Injectafer weekly x 2 doses ASAP RTC with Dr Irene Limbo with labs in 3 months   All of the patients questions were answered with apparent satisfaction. The patient knows to call the clinic with any problems, questions or concerns.  I spent 35 mins  counseling the patient face to face. The total time spent in the appointment was 45 minutes and more than 50% was on counseling and direct patient cares.    Sullivan Lone MD [place] AAHIVMS [facility] Hematology/Oncology [facility]  (Office):       [phone] (Work cell):  [phone] (Fax):           [phone]  04/01/2020 4:19 PM  I, Yevette Edwards, am acting as a scribe for Dr. Sullivan Lone.   .I have reviewed the above documentation for accuracy and completeness, and I  agree with the above. Brunetta Genera MD

## 2020-04-01 NOTE — Telephone Encounter (Signed)
Scheduled per los. Gave avs and calendar  

## 2020-04-04 ENCOUNTER — Other Ambulatory Visit: Payer: Self-pay

## 2020-04-04 ENCOUNTER — Ambulatory Visit: Payer: Medicaid Other | Admitting: Physical Therapy

## 2020-04-04 ENCOUNTER — Encounter: Payer: Self-pay | Admitting: Physical Therapy

## 2020-04-04 DIAGNOSIS — R32 Unspecified urinary incontinence: Secondary | ICD-10-CM

## 2020-04-04 DIAGNOSIS — R278 Other lack of coordination: Secondary | ICD-10-CM | POA: Diagnosis not present

## 2020-04-04 DIAGNOSIS — M6281 Muscle weakness (generalized): Secondary | ICD-10-CM | POA: Diagnosis not present

## 2020-04-04 DIAGNOSIS — R159 Full incontinence of feces: Secondary | ICD-10-CM

## 2020-04-04 NOTE — Patient Instructions (Addendum)
Introduction to Bowel Health Diet and daily habits can help you predict when your bowels will move on a regular basis.  The consistency and quantity of the stool is usually more important than the frequency.  The goal is to have a regular bowel movement that is soft but formed.   Tips on Emptying Regularly . Eat breakfast.  Usually the best time of day for a bowel movement will be a half hour to an hour after eating.  These times are best because the body uses the gastrocolic reflex, a stimulation of bowel motion that occurs with eating, to help produce a bowel movement.  For some people even a simple hot drink in the morning can help the reflex action begin. . Eat all your meals at a predictable time each day.  The bowel functions best when food is introduced at the same regular intervals. . The amount of food eaten at a given time of day should be about the same size from day to day.  The bowel functions best when food is introduced in similar quantities from day to day. It is fine to have a small breakfast and a large lunch, or vice versa, just be consistent. . Eat two servings of fruit or vegetables and at least one serving of a complex carbohydrates (whole grains such as brown rice, bran, whole wheat bread, or oatmeal) at each meal. . Drink plenty of water--ideally eight glasses a day.  Be sure to increase your water intake if you are increasing fiber into your diet.  Maintain Healthy Habits . Exercise daily.  You may exercise at any time of day, but you may find that bowel function is helped most if the exercise is at a consistent time each day. . Make sure that you are not rushed and have convenient access to a bathroom at your selected time to empty your bowels. .   Fiber Table -- Grams of Fiber in Food  For additional information on fiber content in foods, go to www.caloriecounts.com  Food Products Serving Size Grams of Fiber/serving  Breads    Whole Wheat 1 slice 0.62  White 1 slice  0.5  Rye 1 slice 6.94  Cereals    Oat Bran 1 oz. 4.06  Wheat Bran 1 oz. 10.0  All Bran  cup 6.0  Optimum 1 cup 10.0  Whole Wheat Total 1 cup 3.0  Fiber One   cup 13.0  Shredded Wheat 1oz. 2.64  Corn Flakes 1 oz. 0.45  Cheerio's 1 1/3 cup 2.0  Oatmeal 1 oz. 2.5  Rice    Brown  cup 5.27  White  cup 1.42  Spaghetti 2 oz. 2.56  Vegetables (cooked)    Broccoli  cup 2.58  Brussels sprouts  cup 2.0  Cauliflower  cup 2.6  Carrots  cup 3.2  Corn  cup 3.03  Eggplant  cup 0.96  Green peas  cup 3.36  Lettuce (raw)  cup 0.24  Baked potato w/skin  cup 2.97  Spinach  cup 2.07  Squash  cup 2.87  Tomato (raw)  cup 1.17  Zucchini  cup 1.26  Beans    Green (canned)  cup 1.89  Kidney  cup 5.48  Lima  cup 4.25  Pinto  cup 5.93  Fresh fruits    Apple (with peel) 1 medium 2.76  Apricots 1 cup 3.13  Banana  1 medium 2.19  Black/Boysenberries 1 cup 7.2  Grapefruit 1 medium 3.61  Grapes 1 cup 1.12  Nectarine 1 medium  2.2  Orange 1 medium 3.14  Pear (with peel) 1 medium 4.32  Prunes 3 3.5  Raspberries 1 cup 7.5  Strawberries 1 cup 9.83  Watermelon 1 slice 3.82    Introduction to Fiber Fiber Overview Dietary fiber is the part of plants that can't be digested. There are 2 kinds of dietary fiber: insoluble and soluble.  Insoluble fiber adds bulk to keep foods moving through the digestive system. Soluble fiber holds water, which softens the stool for easy bowel movements. Fiber is an important part of your diet, even though it passes through your body undigested and has no nutritional value. A high fiber diet can:  . promote regular bowel movements  . treat diverticular disease (inflammation of part of the intestine) and irritable bowel syndrome (abdominal pain, diarrhea, and constipation that come and go) . promote improvement in hemorrhoids, constipation and fecal incontinence  You should have at least 14 grams of fiber for every 1000 calories you eat every day.  Read the label on every food package to find out how much fiber a serving of the food will provide. Foods containing more than 20% of the daily value of fiber per serving are considered high in fiber.  Without enough fiber in your diet, you may suffer from:  . constipation  . small, hard, dry bowel movements.   Sources of Fiber Breads, cereals, and pasta made with whole grain flour, and brown rice are high fiber foods. Many breakfast cereals list the bran or fiber content, so it's easy to know which products are high in fiber.  All fruits and vegetables also contain fiber. Dried beans, leafy vegetables, peas, raisins, prunes, apples, and citrus fruits are all especially good sources of fiber. Ask for examples of high-fiber foods (the fiber table and types of fiber handouts) for more resources on fiber.  Additional information on fiber content in foods, is available at www.caloriecounts.com  Types of Fiber  There are two main types of fiber:  insoluble and soluble.  Both of these types can prevent and relieve constipation and diarrhea, although some people find one or the other to be more easily digested.  This handout details information about both types of fiber.  Insoluble Fiber       Functions of Insoluble Fiber . moves bulk through the intestines  . controls and balances the pH (acidity) in the intestines       Benefits of Insoluble Fiber . promotes regular bowel movement and prevents constipation  . removes fecal waste through colon in less time  . keeps an optimal pH in intestines to prevent microbes from producing cancer substances, therefore preventing colon cancer        Food Sources of Insoluble Fiber . whole-wheat products  . wheat bran "miller's bran" . corn bran  . flax seed or other seeds . vegetables such as green beans, broccoli, cauliflower and potato skins  . fruit skins and root vegetable skins  . popcorn . brown rice  Skin Care and Bowel Hygiene  Anyone who has  frequent bowel movements, diarrhea, or bowel leakage (fecal incontinence) may experience soreness or skin irritation around the anal region.  Occasionally, the skin can become so inflamed that it breaks into open sores.  Prevent skin breakdown by following good skin care habits.  Cleaning and Washing Techniques After having a bowel movement, men and women should tighten their anal sphincter before wiping.  Women should always wipe from front to back to prevent fecal matter from getting  into the urethra and vagina.   Tips for Cleaning and Washing . wipe from front to back towards the anus . always wipe gently with soft toilet paper, or ideally with moist toilet paper . wipe only once with each piece of toilet paper so as not to re-contaminate the area . wash in warm water alone or with a minimal amount of mild, fragrance-free soap . use non-biological washing powder . gently pat skin completely dry, avoiding rubbing . if drying the skin after washing is difficult or uncomfortable, try using a hairdryer on a low setting (use very carefully) . allow air to get to the irritated area for some part of every day . use protective skin creams containing zinc as recommended by your doctor  What To Avoid . baths with extra-hot water . soaking for long periods of time in the bathtub . disinfectants and antiseptics  . bath oils, bath salts, and talcum powder . using plastic pants, pads, and sheets, which cause sweating . scratching at the irritated area  Additional Tips . some people find that citrus and acidic foods cause or worsen skin irritation . eat a healthy, balanced diet that is high in fiber  . drink plenty of fluids . wear cotton underwear to allow the skin to breath . talk to your healthcare provider about further treatment options; persistent problems need medical attention  Earlie Counts, PT [place], [place] [address]     [phone]  Squirting ketchup bottle to  clean the anal area Use coconut oil on the rectal area Metamucil

## 2020-04-04 NOTE — Therapy (Addendum)
[facility] [address] [place], [place], [address] Phone: [phone]   Fax:  [phone]  Physical Therapy Treatment  Patient Details  Name: Elizabeth Bush MRN: [id number] Date of Birth: Jun 29, 1970 Referring Provider (PT): Dr. Nadeen Landau   Encounter Date: 04/04/2020   PT End of Session - 04/04/20 1253    Visit Number 2    Date for PT Re-Evaluation 05/23/20    Authorization Type [employer]    Authorization Time Period 03/27/2020-05/26/2020    Authorization - Visit Number 1    Authorization - Number of Visits 4    PT Start Time 5885    PT Stop Time 1053    PT Time Calculation (min) 38 min    Activity Tolerance Patient tolerated treatment well;Patient limited by pain   pain in knees   Behavior During Therapy [facility] for tasks assessed/performed           Past Medical History:  Diagnosis Date  . Chlamydia   . Gonorrhea   . Hypertension     Past Surgical History:  Procedure Laterality Date  . COLONOSCOPY  05/31/2011   Procedure: COLONOSCOPY;  Surgeon: Landry Dyke, MD;  Location: [facility];  Service: Endoscopy;  Laterality: N/A;  . ESOPHAGOGASTRODUODENOSCOPY  05/30/2011   Procedure: ESOPHAGOGASTRODUODENOSCOPY (EGD);  Surgeon: Landry Dyke, MD;  Location: [facility];  Service: Endoscopy;  Laterality: N/A;  . GIVENS CAPSULE STUDY  06/01/2011   Procedure: GIVENS CAPSULE STUDY;  Surgeon: Landry Dyke, MD;  Location: [facility];  Service: Endoscopy;  Laterality: N/A;  . TONSILLECTOMY    . TUBAL LIGATION      There were no vitals filed for this visit.   Subjective Assessment - 04/04/20 1011    Subjective I am using my walker. I have knee surgery scheduled on 17 or 18th of December. I have been drinking more water. I do not eat very often because it goes through me. I drink 2 40oz of beer a day.    Patient Stated Goals reduce stool leakage; urine leakage, reduce straining with bowel movement     Currently in Pain? Yes    Pain Score 7     Pain Location Rectum    Pain Orientation Mid    Pain Descriptors / Indicators --   itchy   Pain Type Chronic pain    Pain Onset More than a month ago    Pain Frequency Intermittent    Aggravating Factors  using the bathroom    Pain Relieving Factors not using the bathroom                             [facility] Adult PT Treatment/Exercise - 04/04/20 0001      Ambulation/Gait   Gait Comments adusted her walker to fit her so she is able to walk easier      Self-Care   Self-Care Other Self-Care Comments    Other Self-Care Comments  educated patient on how to take care of the anal area with using coconut oil and gave her some samples of  uber lube, discussed her eating more fiber and 3 times per day, discussed with drinking more water, talked about her making food and  how to get food                  PT Education - 04/04/20 1251    Education Details educated patient on rectal care, bowel  health, eating fiber to bulk up stool and where she is getting food    Person(s) Educated Patient    Methods Explanation;Handout    Comprehension Verbalized understanding            PT Short Term Goals - 03/28/20 1230      PT SHORT TERM GOAL #1   Title independent with initial HEP    Baseline not educated yet    Time 4    Period Weeks    Status New    Target Date 04/25/20      PT SHORT TERM GOAL #2   Title instructed on vaginal and rectal care to reduce dryness and irritation    Time 4    Period Weeks    Status New    Target Date 04/25/20             PT Long Term Goals - 03/28/20 1422      PT LONG TERM GOAL #1   Title independent with advanced HEP for pelvic floor strength    Baseline not educated yet    Time 8    Period Weeks    Status New    Target Date 05/23/20      PT LONG TERM GOAL #2   Title able to contract the pelvic floor >/= 3/5 with a circular contraction to reduce urinary leakage >/= 50%     Baseline pelvic floor strength anterior and posterior is 2/5 and laterally is 1/5    Time 8    Period Weeks    Status New    Target Date 05/23/20      PT LONG TERM GOAL #3   Title able to wear </= 3 depends per day due to reduction of stool and urine leakage and improved pelvic floor strength    Baseline wears 6-7 depends per day    Time 8    Period Weeks    Status New    Target Date 05/23/20      PT LONG TERM GOAL #4   Title able to move in bed with decreased strainin on the pelvic floor due to correctly breathing    Baseline holds breath while moving in bed putting pressure on the pelvic floor    Time 8    Period Weeks    Status New    Target Date 05/23/20                 Plan - 04/04/20 1255    Clinical Impression Statement Patient was educated on ways to do her rectal care with cleaning the area and using coconut oil. Discussed wtih patient on eating regular meals and increasing fiber.Patient reports she does not eat much and has food insecurities. She has trouble preparing meals due to the pain in her knees. Patient does not have family support but has friends that will assist. Therapist adjusted patients walker to fit her better. Patient will be having a knee replacement in December. Patient will benefit from skilled therapy to improve pelvic floor strength and coordination to reduce her leakage.    Personal Factors and Comorbidities Age;Fitness;Past/Current Experience;Social Background;Comorbidity 1;Transportation;Finances;Education    Comorbidities severe knee arthritis    Examination-Activity Limitations Locomotion Level;Continence;Dressing;Toileting;Bed Mobility;Bend;Transfers    Examination-Participation Restrictions Cleaning;Community Activity;Shop;Personal Finances    Stability/Clinical Decision Making Evolving/Moderate complexity    Rehab Potential Good    PT Frequency 1x / week    PT Duration 8 weeks    PT Treatment/Interventions ADLs/Self Care Home  Management;Biofeedback;Cryotherapy;Dealer  Stimulation;Moist Heat;Neuromuscular re-education;Therapeutic exercise;Therapeutic activities;Gait training;Patient/family education;Manual techniques;Energy conservation;Joint Manipulations    PT Next Visit Plan pressure management with correct breath when moving in bed,  toileting, pelvic floor EMG,    Recommended Other Services MD signed initial eval    Consulted and Agree with Plan of Care Patient           Patient will benefit from skilled therapeutic intervention in order to improve the following deficits and impairments:  Abnormal gait, Decreased coordination, Increased fascial restricitons, Decreased endurance, Decreased activity tolerance, Pain, Increased muscle spasms, Decreased strength  Visit Diagnosis: Muscle weakness (generalized)  Other lack of coordination  Incontinence of feces, unspecified fecal incontinence type  Urinary incontinence, unspecified type     Problem List Patient Active Problem List   Diagnosis Date Noted  . Influenza vaccine needed 02/06/2020  . Dependent for transportation 12/12/2019  . 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 Functional urinary incontinence 12/12/2019  . Rectal prolapse 10/31/2019  . Moderate episode of recurrent major depressive disorder ([place]) 10/31/2019  . Primary osteoarthritis of both knees 10/31/2019  . IDA (iron deficiency anemia) 10/30/2019  . Fibroids 10/30/2019  . Unilateral primary osteoarthritis, right knee 09/27/2019  . Alcohol abuse 11/05/2016  . Essential hypertension 11/05/2016  . Grade III hemorrhoids 11/05/2016  . GIB (gastrointestinal bleeding) 05/29/2011    Earlie Counts, PT 04/04/20 1:01 PM   [facility] [address] [place], [place], [address] Phone: [phone]   Fax:  [phone]  Name: Elizabeth Bush MRN: [id number] Date of Birth: [birth year]  PHYSICAL THERAPY DISCHARGE  SUMMARY  Visits from Start of Care: 2  Current functional level related to goals / functional outcomes: See above. Patient had a total knee replacement in 04/2020 so she will be having therapy for her knee.    Remaining deficits: See above.    Education / Equipment: HEP Plan: Patient agrees to discharge.  Patient goals were not met. Patient is being discharged due to a change in medical status.  Thank you for the referral. Earlie Counts, PT 05/23/20 8:06 AM  ?????

## 2020-04-08 ENCOUNTER — Ambulatory Visit: Payer: Medicaid Other | Attending: Internal Medicine | Admitting: Internal Medicine

## 2020-04-08 ENCOUNTER — Encounter: Payer: Self-pay | Admitting: Internal Medicine

## 2020-04-08 ENCOUNTER — Other Ambulatory Visit: Payer: Self-pay

## 2020-04-08 DIAGNOSIS — M170 Bilateral primary osteoarthritis of knee: Secondary | ICD-10-CM

## 2020-04-08 DIAGNOSIS — F17210 Nicotine dependence, cigarettes, uncomplicated: Secondary | ICD-10-CM | POA: Insufficient documentation

## 2020-04-08 DIAGNOSIS — R7989 Other specified abnormal findings of blood chemistry: Secondary | ICD-10-CM

## 2020-04-08 DIAGNOSIS — F1010 Alcohol abuse, uncomplicated: Secondary | ICD-10-CM

## 2020-04-08 DIAGNOSIS — I10 Essential (primary) hypertension: Secondary | ICD-10-CM | POA: Diagnosis not present

## 2020-04-08 DIAGNOSIS — D500 Iron deficiency anemia secondary to blood loss (chronic): Secondary | ICD-10-CM

## 2020-04-08 DIAGNOSIS — K068 Other specified disorders of gingiva and edentulous alveolar ridge: Secondary | ICD-10-CM

## 2020-04-08 NOTE — Progress Notes (Signed)
Pt states she had 4 teeth pulled

## 2020-04-08 NOTE — Progress Notes (Addendum)
Virtual Visit via Telephone Note  I connected with Elizabeth Bush on 04/08/20 at 8:40 a.m by telephone and verified that I am speaking with the correct person using two identifiers.  Location: Patient: home Provider: office Only the pt, my CMA and myself participated in this encounter. I discussed the limitations, risks, security and privacy concerns of performing an evaluation and management service by telephone and the availability of in person appointments. I also discussed with the patient that there may be a patient responsible charge related to this service. The patient expressed understanding and agreed to proceed.   History of Present Illness: Hx of ETOH abuse, HTN, severe hemorrhoids,rectal prolapse,fibroids,iron def anemia, OA knees, MDD, functional urinary and fecal incontinence.  Last seen 01/2020.  Had 4 teeth extractions about 3 wks ago.  Procedure took longer than dentist anticipated.  Still having a lot of gum pain from that.  Does not recall being told how long she will have pain.  Having to eat soft foods. Given Norco 20 tabs to use PRN and has a few left.  Told to gargle with salt and water but has not been doing that.  OA: pain both knees but LT>>RT Saw Dr. Ninfa Linden 02/19/2020.  Given steroid inj BL which helped some.  "But I stay in pain all the time.  I just don't want to move unless I really have to."  Total knee replacement on the left scheduled for December 17.  Anemia: Referred to hematology as she has not been taking the oral iron consistently.  Saw Dr. Irene Limbo 04/01/2020.  Plan for IV iron infusion once a wk for 2 doses 1st one is tomorrow  ETOH use:  Has not drank beers in past 3 days. Feels "its a loneliness thing and I like having something in my hand."  For past 3 days, she has been drinking water and sodas.  Smokes 2-3 cigarettes a wk.  Rectal bleeding: EGD and endoscopy scheduled for early next month.  HTN:  Stopped taking Norvasc.  Has about 10 pills left but  trying to stretch them out due to limited finances and no means of getting to pharmacy.  Received last RF on credit from our pharmacy and states she was told she has exhausted her credit limit.. No device at home to check BP.    Outpatient Encounter Medications as of 04/08/2020  Medication Sig  . amLODipine (NORVASC) 10 MG tablet Take 1 tablet (10 mg total) by mouth daily.  . celecoxib (CELEBREX) 200 MG capsule Take 1 capsule (200 mg total) by mouth daily.  . DULoxetine (CYMBALTA) 30 MG capsule Take 1 capsule (30 mg total) by mouth daily.  . ferrous sulfate 325 (65 FE) MG tablet Take 1 tablet (325 mg total) by mouth daily with breakfast. (Patient taking differently: Take 325 mg by mouth 2 (two) times daily with a meal. )  . omeprazole (PRILOSEC) 20 MG capsule Take 1 capsule (20 mg total) by mouth daily.  Marland Kitchen PEG-KCl-NaCl-NaSulf-Na Asc-C (PLENVU) 140 g SOLR Take 1 kit by mouth as directed. Manufacturer's coupon Universal coupon code:BIN: P2366821; GROUP: [id number]; PCN: [id number]; ID: [id number]; PAY NO MORE $50   No facility-administered encounter medications on file as of 04/08/2020.      Observations/Objective: Depression screen [facility] 2/9 02/06/2020 10/31/2019 10/30/2019  Decreased Interest 2 2 1   Down, Depressed, Hopeless 3 2 3   PHQ - 2 Score 5 4 4   Altered sleeping 3 2 3   Tired, decreased energy 3 1 3   Change in  appetite 3 2 3   Feeling bad or failure about yourself  3 1 3   Trouble concentrating 3 1 3   Moving slowly or fidgety/restless 3 - 2  Suicidal thoughts 0 0 0  PHQ-9 Score 23 11 21   Some recent data might be hidden     Chemistry      Component Value Date/Time   NA 143 04/01/2020 1210   NA 132 (L) 09/15/2019 0909   K 3.8 04/01/2020 1210   CL 110 04/01/2020 1210   CO2 24 04/01/2020 1210   BUN 5 (L) 04/01/2020 1210   BUN 6 09/15/2019 0909   CREATININE 0.69 04/01/2020 1210      Component Value Date/Time   CALCIUM 8.8 (L) 04/01/2020 1210   ALKPHOS 93 04/01/2020 1210   AST 145 (H)  04/01/2020 1210   ALT 55 (H) 04/01/2020 1210   BILITOT 0.3 04/01/2020 1210     Lab Results  Component Value Date   WBC 5.6 04/01/2020   HGB 8.8 (L) 04/01/2020   HCT 28.0 (L) 04/01/2020   MCV 79.8 (L) 04/01/2020   PLT 332 04/01/2020     Assessment and Plan: 1. Alcohol abuse Strongly advised her to quit or significantly cut down. Advised of risk of alcohol withdrawal postoperatively given plans for total knee replacement next month.  Also advised that abnormal liver function tests likely due to EtOH use just looking at the pattern of elevation. Patient states she will do her best not to drink at all moving forward as she does not wish to have her surgery delayed for any reason..  2Sunday Corn smoker Discussed health risks associated with smoking.  Advised to quit completely.  Patient feels she is is able to quit.  Does not request any medicines to help her quit.  Less than 5 minutes spent on counseling.  3. Elevated LFTs See #1 above  4. Essential hypertension Advised to take the amlodipine every day.  Blood pressure will need to be controlled adequately in order to clear her for surgery next month.  Message sent to the clinical pharmacist to see if any further assistance can be given on helping her to get amlodipine  5. Primary osteoarthritis of both knees Plan for total knee replacements starting with the left knee next month.  6. Iron deficiency anemia due to chronic blood loss She plans to keep appointment tomorrow for iron infusion  7. Pain in gums Advised patient to gargle with warm water and salt.  She should also call the dentist to inquire the anticipated length of time it will take for her gums to heal.   Follow Up Instructions: 2 to 3 days prior to total knee replacement for preoperative evaluation and clearance.   I discussed the assessment and treatment plan with the patient. The patient was provided an opportunity to ask questions and all were answered. The patient  agreed with the plan and demonstrated an understanding of the instructions.   The patient was advised to call back or seek an in-person evaluation if the symptoms worsen or if the condition fails to improve as anticipated.  I provided 27 minutes of non-face-to-face time during this encounter.   Karle Plumber, MD

## 2020-04-09 ENCOUNTER — Other Ambulatory Visit: Payer: Self-pay

## 2020-04-09 ENCOUNTER — Inpatient Hospital Stay: Payer: Medicaid Other

## 2020-04-09 VITALS — BP 149/96 | HR 91 | Temp 97.9°F | Resp 18

## 2020-04-09 DIAGNOSIS — F17210 Nicotine dependence, cigarettes, uncomplicated: Secondary | ICD-10-CM | POA: Diagnosis not present

## 2020-04-09 DIAGNOSIS — D500 Iron deficiency anemia secondary to blood loss (chronic): Secondary | ICD-10-CM | POA: Diagnosis not present

## 2020-04-09 DIAGNOSIS — K922 Gastrointestinal hemorrhage, unspecified: Secondary | ICD-10-CM | POA: Diagnosis not present

## 2020-04-09 DIAGNOSIS — M25569 Pain in unspecified knee: Secondary | ICD-10-CM | POA: Diagnosis not present

## 2020-04-09 DIAGNOSIS — Z79899 Other long term (current) drug therapy: Secondary | ICD-10-CM | POA: Diagnosis not present

## 2020-04-09 MED ORDER — ACETAMINOPHEN 325 MG PO TABS
ORAL_TABLET | ORAL | Status: AC
  Filled 2020-04-09: qty 2

## 2020-04-09 MED ORDER — LORATADINE 10 MG PO TABS
ORAL_TABLET | ORAL | Status: AC
  Filled 2020-04-09: qty 1

## 2020-04-09 MED ORDER — LORATADINE 10 MG PO TABS
10.0000 mg | ORAL_TABLET | Freq: Once | ORAL | Status: AC
  Administered 2020-04-09: 10 mg via ORAL

## 2020-04-09 MED ORDER — SODIUM CHLORIDE 0.9 % IV SOLN
Freq: Once | INTRAVENOUS | Status: AC
  Filled 2020-04-09: qty 250

## 2020-04-09 MED ORDER — SODIUM CHLORIDE 0.9 % IV SOLN
750.0000 mg | Freq: Once | INTRAVENOUS | Status: AC
  Administered 2020-04-09: 750 mg via INTRAVENOUS
  Filled 2020-04-09: qty 15

## 2020-04-09 MED ORDER — ACETAMINOPHEN 325 MG PO TABS
650.0000 mg | ORAL_TABLET | Freq: Once | ORAL | Status: AC
  Administered 2020-04-09: 650 mg via ORAL

## 2020-04-09 NOTE — Progress Notes (Signed)
Pt. BP 151/99, pt. denies chest pain, dizziness, and no shortness of breath noted. Also having bilateral leg/knee pain rates pain 10/10. MD notified and pt. had Tylenol per orders. Pt. in stable condition for discharge. Left via ambulation, no respiratory distress noted.

## 2020-04-12 ENCOUNTER — Telehealth: Payer: Self-pay | Admitting: Internal Medicine

## 2020-04-12 NOTE — Telephone Encounter (Signed)
Pt called to get refill on pain medication and states she is taking hydrocodone but I didn't see this on her med list / Please advise / pt asked for a call from Dr. Wynetta Emery

## 2020-04-15 NOTE — Telephone Encounter (Signed)
Patient is requesting hydrocodone. Will route information to patient's PCP.

## 2020-04-16 ENCOUNTER — Ambulatory Visit: Payer: Medicaid Other

## 2020-04-16 NOTE — Telephone Encounter (Signed)
Contacted pt and went over Dr. Wynetta Emery message pt states she understands and would like rx for Tramadol

## 2020-04-17 DIAGNOSIS — Z419 Encounter for procedure for purposes other than remedying health state, unspecified: Secondary | ICD-10-CM | POA: Diagnosis not present

## 2020-04-17 MED ORDER — TRAMADOL HCL 50 MG PO TABS
50.0000 mg | ORAL_TABLET | Freq: Three times a day (TID) | ORAL | 0 refills | Status: DC | PRN
Start: 2020-04-17 — End: 2020-05-03

## 2020-04-17 MED FILL — traMADol HCL 50 MG TABS: 50 | 20 days supply | Qty: 60 | Fill #0

## 2020-04-17 NOTE — Addendum Note (Signed)
Addended by: Karle Plumber B on: 04/17/2020 10:05 AM   Modules accepted: Orders

## 2020-04-18 ENCOUNTER — Telehealth: Payer: Self-pay | Admitting: Hematology

## 2020-04-18 ENCOUNTER — Encounter: Payer: Self-pay | Admitting: Gastroenterology

## 2020-04-18 ENCOUNTER — Other Ambulatory Visit: Payer: Self-pay

## 2020-04-18 ENCOUNTER — Ambulatory Visit (AMBULATORY_SURGERY_CENTER): Payer: Medicaid Other | Admitting: Gastroenterology

## 2020-04-18 VITALS — BP 147/91 | HR 88 | Temp 98.0°F | Resp 16 | Ht 67.0 in | Wt 195.0 lb

## 2020-04-18 DIAGNOSIS — I10 Essential (primary) hypertension: Secondary | ICD-10-CM | POA: Diagnosis not present

## 2020-04-18 DIAGNOSIS — K2970 Gastritis, unspecified, without bleeding: Secondary | ICD-10-CM

## 2020-04-18 DIAGNOSIS — K449 Diaphragmatic hernia without obstruction or gangrene: Secondary | ICD-10-CM

## 2020-04-18 DIAGNOSIS — D509 Iron deficiency anemia, unspecified: Secondary | ICD-10-CM | POA: Diagnosis not present

## 2020-04-18 DIAGNOSIS — K5730 Diverticulosis of large intestine without perforation or abscess without bleeding: Secondary | ICD-10-CM

## 2020-04-18 DIAGNOSIS — K2980 Duodenitis without bleeding: Secondary | ICD-10-CM

## 2020-04-18 DIAGNOSIS — D122 Benign neoplasm of ascending colon: Secondary | ICD-10-CM

## 2020-04-18 DIAGNOSIS — D125 Benign neoplasm of sigmoid colon: Secondary | ICD-10-CM | POA: Diagnosis not present

## 2020-04-18 DIAGNOSIS — K219 Gastro-esophageal reflux disease without esophagitis: Secondary | ICD-10-CM | POA: Diagnosis not present

## 2020-04-18 DIAGNOSIS — K623 Rectal prolapse: Secondary | ICD-10-CM | POA: Diagnosis not present

## 2020-04-18 DIAGNOSIS — K642 Third degree hemorrhoids: Secondary | ICD-10-CM | POA: Diagnosis not present

## 2020-04-18 DIAGNOSIS — K625 Hemorrhage of anus and rectum: Secondary | ICD-10-CM | POA: Diagnosis not present

## 2020-04-18 DIAGNOSIS — D124 Benign neoplasm of descending colon: Secondary | ICD-10-CM | POA: Diagnosis not present

## 2020-04-18 DIAGNOSIS — D500 Iron deficiency anemia secondary to blood loss (chronic): Secondary | ICD-10-CM | POA: Diagnosis not present

## 2020-04-18 DIAGNOSIS — B9681 Helicobacter pylori [H. pylori] as the cause of diseases classified elsewhere: Secondary | ICD-10-CM | POA: Diagnosis not present

## 2020-04-18 DIAGNOSIS — K2951 Unspecified chronic gastritis with bleeding: Secondary | ICD-10-CM | POA: Diagnosis not present

## 2020-04-18 MED ORDER — SODIUM CHLORIDE 0.9 % IV SOLN: 500.0000 mL | Freq: Once | INTRAVENOUS | Status: DC

## 2020-04-18 NOTE — Progress Notes (Signed)
Vitals-CW  History reviewed. 

## 2020-04-18 NOTE — Telephone Encounter (Signed)
Rescheduled per pt. Called and spoke with pt, confirmed 12/10 appt

## 2020-04-18 NOTE — Patient Instructions (Signed)
Handouts given for GERD, hiatal hernia, Gastritis, polyps, hemorrhoids, diverticulosis and high fiber diet.   YOU HAD AN ENDOSCOPIC PROCEDURE TODAY AT [facility]:   Refer to the procedure report that was given to you for any specific questions about what was found during the examination.  If the procedure report does not answer your questions, please call your gastroenterologist to clarify.  If you requested that your care partner not be given the details of your procedure findings, then the procedure report has been included in a sealed envelope for you to review at your convenience later.  YOU SHOULD EXPECT: Some feelings of bloating in the abdomen. Passage of more gas than usual.  Walking can help get rid of the air that was put into your GI tract during the procedure and reduce the bloating. If you had a lower endoscopy (such as a colonoscopy or flexible sigmoidoscopy) you may notice spotting of blood in your stool or on the toilet paper. If you underwent a bowel prep for your procedure, you may not have a normal bowel movement for a few days.  Please Note:  You might notice some irritation and congestion in your nose or some drainage.  This is from the oxygen used during your procedure.  There is no need for concern and it should clear up in a day or so.  SYMPTOMS TO REPORT IMMEDIATELY:   Following lower endoscopy (colonoscopy or flexible sigmoidoscopy):  Excessive amounts of blood in the stool  Significant tenderness or worsening of abdominal pains  Swelling of the abdomen that is new, acute  Fever of 100F or higher   Following upper endoscopy (EGD)  Vomiting of blood or coffee ground material  New chest pain or pain under the shoulder blades  Painful or persistently difficult swallowing  New shortness of breath  Fever of 100F or higher  Black, tarry-looking stools  For urgent or emergent issues, a gastroenterologist can be reached at any hour by calling [phone]. Do not use MyChart messaging for urgent concerns.    DIET:  We do recommend a small meal at first, but then you may proceed to your regular diet.  Drink plenty of fluids but you should avoid alcoholic beverages for 24 hours.  ACTIVITY:  You should plan to take it easy for the rest of today and you should NOT DRIVE NO WORK or use heavy machinery until tomorrow (because of the sedation medicines used during the test).    FOLLOW UP: Our staff will call the number listed on your records Monday at 715 am-8 am following your procedure to check on you and address any questions or concerns that you may have regarding the information given to you following your procedure. If we do not reach you, we will leave a message.  We will attempt to reach you two times.  During this call, we will ask if you have developed any symptoms of COVID 19. If you develop any symptoms (ie: fever, flu-like symptoms, shortness of breath, cough etc.) before then, please call [phone].  If you test positive for Covid 19 in the 2 weeks post procedure, please call and report this information to [place].    If any biopsies were taken you will be contacted by phone or by letter within the next 1-3 weeks.  Please call us at [phone] if you have not heard about the biopsies in 3 weeks.    SIGNATURES/CONFIDENTIALITY: You and/or your care partner have signed paperwork which  will be entered into your electronic medical record.  These signatures attest to the fact that that the information above on your After Visit Summary has been reviewed and is understood.  Full responsibility of the confidentiality of this discharge information lies with you and/or your care-partner.

## 2020-04-18 NOTE — Op Note (Signed)
[place] Patient Name: Elizabeth Bush Procedure Date: 04/18/2020 10:29 AM MRN: [id number] Endoscopist: Gerrit Heck , MD Age: 49 Referring MD:  Date of Birth: 01-01-1971 Gender: Female Account #: [id number] Procedure:                Colonoscopy Indications:              Hematochezia, Iron deficiency anemia, Change in                            bowel habits. History of hemorrhoids and rectal                            prolapse- follows with Dr. Dema Severin in Colorectal                            Surgery. Medicines:                Monitored Anesthesia Care Procedure:                Pre-Anesthesia Assessment:                           - Prior to the procedure, a History and Physical                            was performed, and patient medications and                            allergies were reviewed. The patient's tolerance of                            previous anesthesia was also reviewed. The risks                            and benefits of the procedure and the sedation                            options and risks were discussed with the patient.                            All questions were answered, and informed consent                            was obtained. Prior Anticoagulants: The patient has                            taken no previous anticoagulant or antiplatelet                            agents. ASA Grade Assessment: II - A patient with                            mild systemic disease. After reviewing the risks  and benefits, the patient was deemed in                            satisfactory condition to undergo the procedure.                           After obtaining informed consent, the colonoscope                            was passed under direct vision. Throughout the                            procedure, the patient's blood pressure, pulse, and                            oxygen saturations were monitored continuously. The                             Colonoscope was introduced through the anus and                            advanced to the the terminal ileum. The colonoscopy                            was performed without difficulty. The patient                            tolerated the procedure well. The quality of the                            bowel preparation was good. The terminal ileum,                            ileocecal valve, appendiceal orifice, and rectum                            were photographed. Scope In: 10:49:44 AM Scope Out: 11:08:39 AM Scope Withdrawal Time: 0 hours 13 minutes 38 seconds  Total Procedure Duration: 0 hours 18 minutes 55 seconds  Findings:                 The perianal exam findings include rectal prolapse.                           Four sessile polyps were found in the sigmoid colon                            (2), descending colon,, and ascending colon. The                            polyps were 3 to 4 mm in size. These polyps were                            removed with a cold snare. Resection and  retrieval                            were complete. Estimated blood loss was minimal.                           The mucosa was otherwise normal in the remainder of                            the colon. Biopsies for histology were taken with a                            cold forceps from the right colon and left colon                            for evaluation of microscopic colitis. Estimated                            blood loss was minimal.                           Multiple small and large-mouthed diverticula were                            found in the sigmoid colon and descending colon.                           Non-bleeding internal hemorrhoids were found during                            retroflexion. The hemorrhoids were medium-sized.                           The terminal ileum appeared normal. Complications:            No immediate complications. Estimated Blood Loss:      Estimated blood loss was minimal. Impression:               - Rectal prolapse found on perianal exam.                           - Four 3 to 4 mm polyps in the sigmoid colon, in                            the descending colon and in the ascending colon,                            removed with a cold snare. Resected and retrieved.                           - Normal mucosa in the entire examined colon.                            Biopsied.                           -  Diverticulosis in the sigmoid colon and in the                            descending colon.                           - Non-bleeding internal hemorrhoids.                           - The examined portion of the ileum was normal. Recommendation:           - Patient has a contact number available for                            emergencies. The signs and symptoms of potential                            delayed complications were discussed with the                            patient. Return to normal activities tomorrow.                            Written discharge instructions were provided to the                            patient.                           - Resume previous diet.                           - Continue present medications.                           - Await pathology results.                           - Repeat colonoscopy for surveillance based on                            pathology results.                           - Return to GI clinic PRN.                           - Follow-up with Dr. Dema Severin regarding the                            hemorrhoids and rectal prolapse. Gerrit Heck, MD 04/18/2020 11:20:01 AM

## 2020-04-18 NOTE — Progress Notes (Signed)
Called to room to assist during endoscopic procedure.  Patient ID and intended procedure confirmed with present staff. Received instructions for my participation in the procedure from the performing physician.  

## 2020-04-18 NOTE — Op Note (Signed)
[facility] Patient Name: Mekisha Bittel Procedure Date: 04/18/2020 10:29 AM MRN: [id number] Endoscopist: Gerrit Heck , MD Age: 49 Referring MD:  Date of Birth: [birth year] Gender: Female Account #: [id number] Procedure:                Upper GI endoscopy Indications:              Iron deficiency anemia, Esophageal reflux Medicines:                Monitored Anesthesia Care Procedure:                Pre-Anesthesia Assessment:                           - Prior to the procedure, a History and Physical                            was performed, and patient medications and                            allergies were reviewed. The patient's tolerance of                            previous anesthesia was also reviewed. The risks                            and benefits of the procedure and the sedation                            options and risks were discussed with the patient.                            All questions were answered, and informed consent                            was obtained. Prior Anticoagulants: The patient has                            taken no previous anticoagulant or antiplatelet                            agents. ASA Grade Assessment: II - A patient with                            mild systemic disease. After reviewing the risks                            and benefits, the patient was deemed in                            satisfactory condition to undergo the procedure.                           After obtaining informed consent, the endoscope was  passed under direct vision. Throughout the                            procedure, the patient's blood pressure, pulse, and                            oxygen saturations were monitored continuously. The                            Endoscope was introduced through the mouth, and                            advanced to the second part of duodenum. The upper                            GI endoscopy  was accomplished without difficulty.                            The patient tolerated the procedure well. Scope In: Scope Out: Findings:                 A 2 cm sliding type hiatal hernia was present.                           The Z-line was regular and was found 39 cm from the                            incisors.                           The gastroesophageal flap valve was visualized                            endoscopically and classified as Hill Grade III                            (minimal fold, loose to endoscope, hiatal hernia                            likely).                           Mild inflammation characterized by congestion                            (edema) and erythema was found in the gastric body                            and in the gastric antrum. Biopsies were taken with                            a cold forceps for Helicobacter pylori testing.                            Estimated blood loss  was minimal.                           The examined duodenum was normal. Biopsies for                            histology were taken with a cold forceps for                            evaluation of celiac disease. Estimated blood loss                            was minimal. Complications:            No immediate complications. Estimated Blood Loss:     Estimated blood loss was minimal. Impression:               - 2 cm hiatal hernia.                           - Z-line regular, 39 cm from the incisors.                           - Gastroesophageal flap valve classified as Hill                            Grade III (minimal fold, loose to endoscope, hiatal                            hernia likely).                           - Gastritis. Biopsied.                           - Normal examined duodenum. Biopsied. Recommendation:           - Patient has a contact number available for                            emergencies. The signs and symptoms of potential                             delayed complications were discussed with the                            patient. Return to normal activities tomorrow.                            Written discharge instructions were provided to the                            patient.                           - Resume previous diet.                           -  Continue present medications.                           - Await pathology results.                           - Perform a colonoscopy today. Gerrit Heck, MD 04/18/2020 11:14:16 AM

## 2020-04-18 NOTE — Progress Notes (Signed)
Report to PACU, RN, vss, BBS= Clear.  

## 2020-04-22 ENCOUNTER — Telehealth: Payer: Self-pay

## 2020-04-22 NOTE — Telephone Encounter (Signed)
  Follow up Call-  Call back number 04/18/2020  Post procedure Call Back phone  # [phone]  Permission to leave phone message Yes  Some recent data might be hidden     Patient questions:  Do you have a fever, pain , or abdominal swelling? No. Pain Score  0 *  Have you tolerated food without any problems? Yes.    Have you been able to return to your normal activities? Yes.    Do you have any questions about your discharge instructions: Diet   No. Medications  No. Follow up visit  No.  Do you have questions or concerns about your Care? No.  Actions: * If pain score is 4 or above: No action needed, pain <4. 1. Have you developed a fever since your procedure? no  2.   Have you had an respiratory symptoms (SOB or cough) since your procedure? no  3.   Have you tested positive for COVID 19 since your procedure no  4.   Have you had any family members/close contacts diagnosed with the COVID 19 since your procedure?  no   If yes to any of these questions please route to Joylene John, RN and Joella Prince, RN

## 2020-04-24 ENCOUNTER — Telehealth: Payer: Self-pay | Admitting: General Surgery

## 2020-04-24 DIAGNOSIS — R32 Unspecified urinary incontinence: Secondary | ICD-10-CM | POA: Diagnosis not present

## 2020-04-24 MED ORDER — METRONIDAZOLE 250 MG PO TABS: 250.0000 mg | ORAL_TABLET | Freq: Four times a day (QID) | ORAL | 0 refills | Status: AC

## 2020-04-24 MED ORDER — OMEPRAZOLE 20 MG PO CPDR: 20.0000 mg | DELAYED_RELEASE_CAPSULE | Freq: Two times a day (BID) | ORAL | 0 refills | Status: DC

## 2020-04-24 MED ORDER — DOXYCYCLINE HYCLATE 100 MG PO TABS: 100.0000 mg | ORAL_TABLET | Freq: Two times a day (BID) | ORAL | 0 refills | Status: AC

## 2020-04-24 NOTE — Telephone Encounter (Signed)
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  These are considered benign, but precancerous.  These were resected entirely at the time of colonoscopy.  Recommend repeat colonoscopy in 3 years.-The biopsies taken from your colon were normal and there was no evidence of Microscopic Colitis or chronic inflammatory changes.  Upper Endoscopy:-The biopsies taken from your small intestine demonstrate inflammation (peptic duodenitis), but there was no evidence of Celiac Disease.  -The biopsies from the stomach were notable for H. Pylori gastritis, and will plan on treating with quad therapy as below. Please confirm no medication allergies to the prescribed regimen.   1) Omeprazole 20 mg 2 times a day x 14 d 2) Pepto Bismol 2 tabs (262 mg each) 4 times a day x 14 d 3) Metronidazole 250 mg 4 times a day x 14 d 4) doxycycline 100 mg 2 times a day x 14 d  After 14 days, ok to stop omeprazole.  4 weeks after treatment completed, check H. Pylori stool antigen to confirm eradication (must be off acid suppression therapy)  Dx: H. Pylori gastritisPlan for follow-up in the GI clinic in approximately 8 weeks.

## 2020-04-24 NOTE — Telephone Encounter (Signed)
Spoke with the patient and made her aware of the biopsies and her h pylori, patient asked for the rxs to be sent to [facility]. Patient is aware it is 4 prescriptions for 4 weeks. After 14 days the patient may stop omeprazole. 4 weeks after treatment is completed you will need to go to our [facility] and have an h. Pylori stool antigen test. Follow up in 8 weeks with Elizabeth Bush per patient request.

## 2020-04-26 ENCOUNTER — Inpatient Hospital Stay: Payer: Medicaid Other | Attending: Hematology and Oncology

## 2020-04-26 ENCOUNTER — Other Ambulatory Visit: Payer: Self-pay

## 2020-04-26 VITALS — BP 115/63 | HR 87 | Temp 98.0°F | Resp 20

## 2020-04-26 DIAGNOSIS — D509 Iron deficiency anemia, unspecified: Secondary | ICD-10-CM | POA: Diagnosis not present

## 2020-04-26 DIAGNOSIS — K625 Hemorrhage of anus and rectum: Secondary | ICD-10-CM | POA: Insufficient documentation

## 2020-04-26 DIAGNOSIS — D500 Iron deficiency anemia secondary to blood loss (chronic): Secondary | ICD-10-CM

## 2020-04-26 MED ORDER — SODIUM CHLORIDE 0.9 % IV SOLN
Freq: Once | INTRAVENOUS | Status: AC
  Filled 2020-04-26: qty 250

## 2020-04-26 MED ORDER — LORATADINE 10 MG PO TABS
10.0000 mg | ORAL_TABLET | Freq: Once | ORAL | Status: AC
  Administered 2020-04-26: 10 mg via ORAL

## 2020-04-26 MED ORDER — SODIUM CHLORIDE 0.9 % IV SOLN
750.0000 mg | Freq: Once | INTRAVENOUS | Status: AC
  Administered 2020-04-26: 750 mg via INTRAVENOUS
  Filled 2020-04-26: qty 15

## 2020-04-26 MED ORDER — LORATADINE 10 MG PO TABS
ORAL_TABLET | ORAL | Status: AC
  Filled 2020-04-26: qty 1

## 2020-04-26 MED ORDER — ACETAMINOPHEN 325 MG PO TABS
ORAL_TABLET | ORAL | Status: AC
  Filled 2020-04-26: qty 2

## 2020-04-26 MED ORDER — ACETAMINOPHEN 325 MG PO TABS
650.0000 mg | ORAL_TABLET | Freq: Once | ORAL | Status: AC
  Administered 2020-04-26: 650 mg via ORAL

## 2020-04-26 NOTE — Patient Instructions (Signed)

## 2020-04-26 NOTE — Progress Notes (Signed)
Pt. Discharged in stable condition via wheelchair.

## 2020-04-29 ENCOUNTER — Other Ambulatory Visit: Payer: Self-pay | Admitting: Physician Assistant

## 2020-04-29 ENCOUNTER — Telehealth: Payer: Self-pay | Admitting: Radiology

## 2020-04-29 NOTE — Progress Notes (Signed)
nEED ORDERS IN Epic.  pREOP ON 04/30/2020

## 2020-04-29 NOTE — Telephone Encounter (Signed)
Patient called, and states she has questions about some blood work she is supposed to have done this week.  I tried to help her but could not understand what she was talking about.  Please call her and advise.

## 2020-04-29 NOTE — Telephone Encounter (Signed)
Patient states she had "a couple beers" and was wondering if this was going to affect her bloodwork for surgery I told her it shouldn't

## 2020-04-30 ENCOUNTER — Other Ambulatory Visit (HOSPITAL_COMMUNITY)
Admission: RE | Admit: 2020-04-30 | Discharge: 2020-04-30 | Disposition: A | Discharge status: Home / Self Care | Payer: Medicaid Other | Source: Ambulatory Visit | Attending: Orthopaedic Surgery | Admitting: Orthopaedic Surgery

## 2020-04-30 ENCOUNTER — Encounter (HOSPITAL_COMMUNITY)
Admission: RE | Admit: 2020-04-30 | Discharge: 2020-04-30 | Disposition: A | Discharge status: Home / Self Care | Payer: Medicaid Other | Source: Ambulatory Visit | Attending: Orthopaedic Surgery | Admitting: Orthopaedic Surgery

## 2020-04-30 ENCOUNTER — Encounter (HOSPITAL_COMMUNITY): Payer: Self-pay

## 2020-04-30 ENCOUNTER — Other Ambulatory Visit: Payer: Self-pay

## 2020-04-30 DIAGNOSIS — Z01818 Encounter for other preprocedural examination: Secondary | ICD-10-CM | POA: Diagnosis not present

## 2020-04-30 DIAGNOSIS — Z20822 Contact with and (suspected) exposure to covid-19: Secondary | ICD-10-CM | POA: Diagnosis not present

## 2020-04-30 DIAGNOSIS — Z01812 Encounter for preprocedural laboratory examination: Secondary | ICD-10-CM | POA: Insufficient documentation

## 2020-04-30 HISTORY — DX: Gastro-esophageal reflux disease without esophagitis: K21.9

## 2020-04-30 HISTORY — DX: Depression, unspecified: F32.A

## 2020-04-30 HISTORY — DX: Dyspnea, unspecified: R06.00

## 2020-04-30 HISTORY — DX: Unspecified osteoarthritis, unspecified site: M19.90

## 2020-04-30 HISTORY — DX: Anxiety disorder, unspecified: F41.9

## 2020-04-30 HISTORY — DX: Anemia, unspecified: D64.9

## 2020-04-30 LAB — CBC
HCT: 33.1 % — ABNORMAL LOW (ref 36.0–46.0)
Hemoglobin: 10.6 g/dL — ABNORMAL LOW (ref 12.0–15.0)
MCH: 27.2 pg (ref 26.0–34.0)
MCHC: 32 g/dL (ref 30.0–36.0)
MCV: 85.1 fL (ref 80.0–100.0)
Platelets: 328 10*3/uL (ref 150–400)
RBC: 3.89 MIL/uL (ref 3.87–5.11)
RDW: 24.6 % — ABNORMAL HIGH (ref 11.5–15.5)
WBC: 6.1 10*3/uL (ref 4.0–10.5)
nRBC: 0 % (ref 0.0–0.2)

## 2020-04-30 LAB — SARS CORONAVIRUS 2 (TAT 6-24 HRS): SARS Coronavirus 2: NEGATIVE

## 2020-04-30 LAB — COMPREHENSIVE METABOLIC PANEL
ALT: 37 U/L (ref 0–44)
AST: 88 U/L — ABNORMAL HIGH (ref 15–41)
Albumin: 3.5 g/dL (ref 3.5–5.0)
Alkaline Phosphatase: 110 U/L (ref 38–126)
Anion gap: 9 (ref 5–15)
BUN: 9 mg/dL (ref 6–20)
CO2: 24 mmol/L (ref 22–32)
Calcium: 8.9 mg/dL (ref 8.9–10.3)
Chloride: 107 mmol/L (ref 98–111)
Creatinine, Ser: 0.57 mg/dL (ref 0.44–1.00)
GFR, Estimated: >60 mL/min (ref >60)
Glucose, Bld: 96 mg/dL (ref 70–99)
Potassium: 3.8 mmol/L (ref 3.5–5.1)
Sodium: 140 mmol/L (ref 135–145)
Total Bilirubin: 0.4 mg/dL (ref 0.3–1.2)
Total Protein: 8.3 g/dL — ABNORMAL HIGH (ref 6.5–8.1)

## 2020-04-30 LAB — SURGICAL PCR SCREEN
MRSA, PCR: NEGATIVE
Staphylococcus aureus: NEGATIVE

## 2020-04-30 NOTE — Progress Notes (Signed)
DUE TO COVID-19 ONLY ONE VISITOR IS ALLOWED TO COME WITH YOU AND STAY IN THE WAITING ROOM ONLY DURING PRE OP AND PROCEDURE DAY OF SURGERY. THE 1 VISITOR  MAY VISIT WITH YOU AFTER SURGERY IN YOUR PRIVATE ROOM DURING VISITING HOURS ONLY!  YOU NEED TO HAVE A COVID 19 TEST ON_12/14/2021 ______ @_______ , THIS TEST MUST BE DONE BEFORE SURGERY,  COVID TESTING SITE [address] [address], IT IS ON THE RIGHT GOING OUT [address] APPROXIMATELY  2 MINUTES PAST [employer] ON THE RIGHT. ONCE YOUR COVID TEST IS COMPLETED,  PLEASE BEGIN THE QUARANTINE INSTRUCTIONS AS OUTLINED IN YOUR HANDOUT.                Elizabeth Bush  04/30/2020   Your procedure is scheduled on: 05/03/2020    Report to [facility] Main  Entrance   Report to admitting at      0600 AM     Call this number if you have problems the morning of surgery [phone]    REMEMBER: NO  SOLID FOOD CANDY OR GUM AFTER MIDNIGHT. CLEAR LIQUIDS UNTIL  0530am        . NOTHING BY MOUTH EXCEPT CLEAR LIQUIDS UNTIL    . PLEASE FINISH ENSURE DRINK PER SURGEON ORDER  WHICH NEEDS TO BE COMPLETED AT  0530am     .      CLEAR LIQUID DIET   Foods Allowed                                                                    Coffee and tea, regular and decaf                            Fruit ices (not with fruit pulp)                                      Iced Popsicles                                    Carbonated beverages, regular and diet                                    Cranberry, grape and apple juices Sports drinks like Gatorade Lightly seasoned clear broth or consume(fat free) Sugar, honey syrup ___________________________________________________________________      BRUSH YOUR TEETH MORNING OF SURGERY AND RINSE YOUR MOUTH OUT, NO CHEWING GUM CANDY OR MINTS.     Take these medicines the morning of surgery with A SIP OF WATER: amlodipine, prilosec   DO NOT TAKE ANY DIABETIC MEDICATIONS DAY OF YOUR  SURGERY                               You may not have any metal on your body including hair pins and              piercings  Do not wear jewelry, make-up, lotions, powders or perfumes, deodorant             Do not wear nail polish on your fingernails.  Do not shave  48 hours prior to surgery.              Men may shave face and neck.   Do not bring valuables to the hospital. [place].  Contacts, dentures or bridgework may not be worn into surgery.  Leave suitcase in the car. After surgery it may be brought to your room.     Patients discharged the day of surgery will not be allowed to drive home. IF YOU ARE HAVING SURGERY AND GOING HOME THE SAME DAY, YOU MUST HAVE AN ADULT TO DRIVE YOU HOME AND BE WITH YOU FOR 24 HOURS. YOU MAY GO HOME BY TAXI OR UBER OR ORTHERWISE, BUT AN ADULT MUST ACCOMPANY YOU HOME AND STAY WITH YOU FOR 24 HOURS.  Name and phone number of your driver:  Special Instructions: N/A              Please read over the following fact sheets you were given: _____________________________________________________________________  [facility] - Preparing for Surgery Before surgery, you can play an important role.  Because skin is not sterile, your skin needs to be as free of germs as possible.  You can reduce the number of germs on your skin by washing with CHG (chlorahexidine gluconate) soap before surgery.  CHG is an antiseptic cleaner which kills germs and bonds with the skin to continue killing germs even after washing. Please DO NOT use if you have an allergy to CHG or antibacterial soaps.  If your skin becomes reddened/irritated stop using the CHG and inform your nurse when you arrive at Short Stay. Do not shave (including legs and underarms) for at least 48 hours prior to the first CHG shower.  You may shave your face/neck. Please follow these instructions carefully:  1.  Shower with CHG Soap the night before surgery and the   morning of Surgery.  2.  If you choose to wash your hair, wash your hair first as usual with your  normal  shampoo.  3.  After you shampoo, rinse your hair and body thoroughly to remove the  shampoo.                           4.  Use CHG as you would any other liquid soap.  You can apply chg directly  to the skin and wash                       Gently with a scrungie or clean washcloth.  5.  Apply the CHG Soap to your body ONLY FROM THE NECK DOWN.   Do not use on face/ open                           Wound or open sores. Avoid contact with eyes, ears mouth and genitals (private parts).                       Wash face,  Genitals (private parts) with your normal soap.             6.  Wash  thoroughly, paying special attention to the area where your surgery  will be performed.  7.  Thoroughly rinse your body with warm water from the neck down.  8.  DO NOT shower/wash with your normal soap after using and rinsing off  the CHG Soap.                9.  Pat yourself dry with a clean towel.            10.  Wear clean pajamas.            11.  Place clean sheets on your bed the night of your first shower and do not  sleep with pets. Day of Surgery : Do not apply any lotions/deodorants the morning of surgery.  Please wear clean clothes to the hospital/surgery center.  FAILURE TO FOLLOW THESE INSTRUCTIONS MAY RESULT IN THE CANCELLATION OF YOUR SURGERY PATIENT SIGNATURE_________________________________  NURSE SIGNATURE__________________________________  ________________________________________________________________________

## 2020-04-30 NOTE — Progress Notes (Addendum)
Anesthesia Review:  PCP:  DR Percell Miller 04/08/2020  Telemed visit  Cardiologist : Chest x-ray : EKG :04/30/20  Echo : Stress test: Cardiac Cath :  Activity level: cannot do a flight of stairs without didfficulty  Sleep Study/ CPAP : no  Fasting Blood Sugar :      / Checks Blood Sugar -- times a day:   Blood Thinner/ Instructions /Last Dose: ASA / Instructions/ Last Dose :  Had blood transfusion on 04/26/2020.  CBC and BMP routed to Dr Zollie Beckers.

## 2020-05-02 DIAGNOSIS — M1712 Unilateral primary osteoarthritis, left knee: Secondary | ICD-10-CM

## 2020-05-02 NOTE — H&P (Signed)
TOTAL KNEE ADMISSION H&P  Patient is being admitted for left total knee arthroplasty.  Subjective:  Chief Complaint:left knee pain.  HPI: Elizabeth Bush, 49 y.o. female, has a history of pain and functional disability in the left knee due to arthritis and has failed non-surgical conservative treatments for greater than 12 weeks to includeNSAID's and/or analgesics, corticosteriod injections, viscosupplementation injections, flexibility and strengthening excercises, supervised PT with diminished ADL's post treatment, use of assistive devices, weight reduction as appropriate and activity modification.  Onset of symptoms was gradual, starting 5 years ago with gradually worsening course since that time. The patient noted no past surgery on the left knee(s).  Patient currently rates pain in the left knee(s) at 10 out of 10 with activity. Patient has night pain, worsening of pain with activity and weight bearing, pain that interferes with activities of daily living, pain with passive range of motion, crepitus and joint swelling.  Patient has evidence of subchondral sclerosis, periarticular osteophytes and joint space narrowing by imaging studies. There is no active infection.  Patient Active Problem List   Diagnosis Date Noted  . Unilateral primary osteoarthritis, left knee 05/02/2020  . Light smoker 04/08/2020  . Influenza vaccine needed 02/06/2020  . Dependent for transportation 12/12/2019  . 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 Functional urinary incontinence 12/12/2019  . Rectal prolapse 10/31/2019  . Moderate episode of recurrent major depressive disorder ([place]) 10/31/2019  . Primary osteoarthritis of both knees 10/31/2019  . IDA (iron deficiency anemia) 10/30/2019  . Fibroids 10/30/2019  . Unilateral primary osteoarthritis, right knee 09/27/2019  . Alcohol abuse 11/05/2016  . Essential hypertension 11/05/2016  . Grade III hemorrhoids 11/05/2016  . GIB (gastrointestinal bleeding)  05/29/2011   Past Medical History:  Diagnosis Date  . Anemia   . Anxiety   . Arthritis   . Chlamydia   . Depression   . Dyspnea    when walkking   . GERD (gastroesophageal reflux disease)   . Gonorrhea   . Hypertension     Past Surgical History:  Procedure Laterality Date  . COLONOSCOPY  05/31/2011   Procedure: COLONOSCOPY;  Surgeon: Landry Dyke, MD;  Location: [facility];  Service: Endoscopy;  Laterality: N/A;  . ESOPHAGOGASTRODUODENOSCOPY  05/30/2011   Procedure: ESOPHAGOGASTRODUODENOSCOPY (EGD);  Surgeon: Landry Dyke, MD;  Location: [facility];  Service: Endoscopy;  Laterality: N/A;  . GIVENS CAPSULE STUDY  06/01/2011   Procedure: GIVENS CAPSULE STUDY;  Surgeon: Landry Dyke, MD;  Location: [facility];  Service: Endoscopy;  Laterality: N/A;  . TONSILLECTOMY    . TUBAL LIGATION      No current facility-administered medications for this encounter.   Current Outpatient Medications  Medication Sig Dispense Refill Last Dose  . amLODipine (NORVASC) 10 MG tablet Take 1 tablet (10 mg total) by mouth daily. 90 tablet 3   . celecoxib (CELEBREX) 200 MG capsule Take 1 capsule (200 mg total) by mouth daily. 30 capsule 3   . Multiple Vitamins-Minerals (MULTIVITAMIN WITH MINERALS) tablet Take 1 tablet by mouth daily.     Marland Kitchen doxycycline (VIBRA-TABS) 100 MG tablet Take 1 tablet (100 mg total) by mouth 2 (two) times daily for 14 days. 28 tablet 0   . DULoxetine (CYMBALTA) 30 MG capsule Take 1 capsule (30 mg total) by mouth daily. (Patient not taking: Reported on 04/24/2020) 30 capsule 5 Not Taking at Unknown time  . ferrous sulfate 325 (65 FE) MG tablet Take 1 tablet (325 mg total) by mouth daily with breakfast. (  Patient not taking: Reported on 04/24/2020) 100 tablet 3 Not Taking at Unknown time  . metroNIDAZOLE (FLAGYL) 250 MG tablet Take 1 tablet (250 mg total) by mouth 4 (four) times daily for 14 days. 56 tablet 0   . omeprazole (PRILOSEC) 20 MG capsule Take 1 capsule (20 mg  total) by mouth in the morning and at bedtime for 14 days. 28 capsule 0   . traMADol (ULTRAM) 50 MG tablet Take 1 tablet (50 mg total) by mouth every 8 (eight) hours as needed. (Patient not taking: Reported on 04/24/2020) 60 tablet 0 Not Taking at Unknown time   Allergies  Allergen Reactions  . Lisinopril Swelling    Swelling of mouth/lips    Social History   Tobacco Use  . Smoking status: Current Some Day Smoker    Packs/day: 0.25    Years: 5.00    Pack years: 1.25    Types: Cigarettes    Last attempt to quit: 11/04/2016    Years since quitting: 3.4  . Smokeless tobacco: Never Used  Substance Use Topics  . Alcohol use: Yes    Alcohol/week: 6.0 standard drinks    Types: 6 Cans of beer per week    Comment: 2 beers daily     Family History  Problem Relation Age of Onset  . Diabetes Mother   . Lung cancer Mother   . Stroke Mother   . Heart disease Mother   . Hypertension Other   . Asthma Other   . Stroke Father   . Heart disease Father   . Colon cancer Neg Hx   . Esophageal cancer Neg Hx   . Stomach cancer Neg Hx      Review of Systems  Musculoskeletal: Positive for gait problem and joint swelling.  All other systems reviewed and are negative.   Objective:  Physical Exam Vitals reviewed.  Constitutional:      Appearance: Normal appearance.  HENT:     Head: Normocephalic and atraumatic.  Eyes:     Extraocular Movements: Extraocular movements intact.     Pupils: Pupils are equal, round, and reactive to light.  Cardiovascular:     Rate and Rhythm: Normal rate and regular rhythm.     Pulses: Normal pulses.  Pulmonary:     Effort: Pulmonary effort is normal.     Breath sounds: Normal breath sounds.  Abdominal:     Palpations: Abdomen is soft.  Musculoskeletal:     Cervical back: Normal range of motion and neck supple.     Left knee: Swelling, effusion, bony tenderness and crepitus present. Decreased range of motion. Tenderness present over the medial joint  line, lateral joint line and patellar tendon. Abnormal alignment.  Neurological:     Mental Status: She is alert and oriented to person, place, and time.  Psychiatric:        Behavior: Behavior normal.     Vital signs in last 24 hours:    Labs:   Estimated body mass index is 30.85 kg/m as calculated from the following:   Height as of 04/30/20: 5\' 7"  (1.702 m).   Weight as of 04/30/20: 89.4 kg.   Imaging Review Plain radiographs demonstrate severe degenerative joint disease of the left knee(s). The overall alignment ismild varus. The bone quality appears to be excellent for age and reported activity level.      Assessment/Plan:  End stage arthritis, left knee   The patient history, physical examination, clinical judgment of the provider and imaging studies are  consistent with end stage degenerative joint disease of the left knee(s) and total knee arthroplasty is deemed medically necessary. The treatment options including medical management, injection therapy arthroscopy and arthroplasty were discussed at length. The risks and benefits of total knee arthroplasty were presented and reviewed. The risks due to aseptic loosening, infection, stiffness, patella tracking problems, thromboembolic complications and other imponderables were discussed. The patient acknowledged the explanation, agreed to proceed with the plan and consent was signed. Patient is being admitted for inpatient treatment for surgery, pain control, PT, OT, prophylactic antibiotics, VTE prophylaxis, progressive ambulation and ADL's and discharge planning. The patient is planning to be discharged home with home health services

## 2020-05-02 NOTE — Anesthesia Preprocedure Evaluation (Addendum)
Anesthesia Evaluation  Patient identified by MRN, date of birth, ID band Patient awake    Reviewed: Allergy & Precautions, NPO status , Patient's Chart, lab work & pertinent test results  History of Anesthesia Complications Negative for: history of anesthetic complications  Airway Mallampati: II  TM Distance: >3 FB Neck ROM: Full    Dental   Pulmonary Current Smoker and Patient abstained from smoking.,    Pulmonary exam normal        Cardiovascular hypertension, Normal cardiovascular exam     Neuro/Psych Anxiety Depression negative neurological ROS     GI/Hepatic GERD  ,(+)     substance abuse  alcohol use,   Endo/Other  negative endocrine ROS  Renal/GU negative Renal ROS  negative genitourinary   Musculoskeletal  (+) Arthritis ,   Abdominal   Peds  Hematology  (+) anemia , Hgb 10.6   Anesthesia Other Findings  HTN, GERD, alcohol abuse, anemia (Hgb 10.6)  Reproductive/Obstetrics                            Anesthesia Physical Anesthesia Plan  ASA: II  Anesthesia Plan: Spinal   Post-op Pain Management:  Regional for Post-op pain   Induction:   PONV Risk Score and Plan: 2 and Propofol infusion, Treatment may vary due to age or medical condition, Ondansetron and TIVA  Airway Management Planned: Nasal Cannula and Simple Face Mask  Additional Equipment: None  Intra-op Plan:   Post-operative Plan:   Informed Consent: I have reviewed the patients History and Physical, chart, labs and discussed the procedure including the risks, benefits and alternatives for the proposed anesthesia with the patient or authorized representative who has indicated his/her understanding and acceptance.       Plan Discussed with:   Anesthesia Plan Comments:        Anesthesia Quick Evaluation

## 2020-05-03 ENCOUNTER — Encounter (HOSPITAL_COMMUNITY)
Admission: AD | Disposition: A | Discharge status: Home Health | Payer: Self-pay | Source: Ambulatory Visit | Attending: Orthopaedic Surgery

## 2020-05-03 ENCOUNTER — Other Ambulatory Visit: Payer: Self-pay

## 2020-05-03 ENCOUNTER — Inpatient Hospital Stay (HOSPITAL_COMMUNITY)
Admission: AD | Admit: 2020-05-03 | Discharge: 2020-05-05 | DRG: 470 | Disposition: A | Discharge status: Home Health | Payer: Medicaid Other | Source: Ambulatory Visit | Attending: Orthopaedic Surgery | Admitting: Orthopaedic Surgery

## 2020-05-03 ENCOUNTER — Ambulatory Visit (HOSPITAL_COMMUNITY): Payer: Medicaid Other | Admitting: Physician Assistant

## 2020-05-03 ENCOUNTER — Encounter (HOSPITAL_COMMUNITY): Payer: Self-pay | Admitting: Orthopaedic Surgery

## 2020-05-03 ENCOUNTER — Observation Stay (HOSPITAL_COMMUNITY): Payer: Medicaid Other

## 2020-05-03 ENCOUNTER — Ambulatory Visit (HOSPITAL_COMMUNITY): Payer: Medicaid Other | Admitting: Anesthesiology

## 2020-05-03 DIAGNOSIS — D62 Acute posthemorrhagic anemia: Secondary | ICD-10-CM | POA: Diagnosis not present

## 2020-05-03 DIAGNOSIS — K219 Gastro-esophageal reflux disease without esophagitis: Secondary | ICD-10-CM | POA: Diagnosis present

## 2020-05-03 DIAGNOSIS — F17210 Nicotine dependence, cigarettes, uncomplicated: Secondary | ICD-10-CM | POA: Diagnosis not present

## 2020-05-03 DIAGNOSIS — Z833 Family history of diabetes mellitus: Secondary | ICD-10-CM

## 2020-05-03 DIAGNOSIS — Z8249 Family history of ischemic heart disease and other diseases of the circulatory system: Secondary | ICD-10-CM | POA: Diagnosis not present

## 2020-05-03 DIAGNOSIS — Z888 Allergy status to other drugs, medicaments and biological substances status: Secondary | ICD-10-CM | POA: Diagnosis not present

## 2020-05-03 DIAGNOSIS — Z79899 Other long term (current) drug therapy: Secondary | ICD-10-CM

## 2020-05-03 DIAGNOSIS — M659 Synovitis and tenosynovitis, unspecified: Secondary | ICD-10-CM | POA: Diagnosis present

## 2020-05-03 DIAGNOSIS — Z96642 Presence of left artificial hip joint: Secondary | ICD-10-CM | POA: Diagnosis not present

## 2020-05-03 DIAGNOSIS — I10 Essential (primary) hypertension: Secondary | ICD-10-CM | POA: Diagnosis present

## 2020-05-03 DIAGNOSIS — M21162 Varus deformity, not elsewhere classified, left knee: Secondary | ICD-10-CM | POA: Diagnosis not present

## 2020-05-03 DIAGNOSIS — G8918 Other acute postprocedural pain: Secondary | ICD-10-CM | POA: Diagnosis not present

## 2020-05-03 DIAGNOSIS — F419 Anxiety disorder, unspecified: Secondary | ICD-10-CM | POA: Diagnosis not present

## 2020-05-03 DIAGNOSIS — M1712 Unilateral primary osteoarthritis, left knee: Secondary | ICD-10-CM | POA: Diagnosis not present

## 2020-05-03 DIAGNOSIS — Z825 Family history of asthma and other chronic lower respiratory diseases: Secondary | ICD-10-CM

## 2020-05-03 DIAGNOSIS — M170 Bilateral primary osteoarthritis of knee: Principal | ICD-10-CM | POA: Diagnosis present

## 2020-05-03 DIAGNOSIS — Z96652 Presence of left artificial knee joint: Secondary | ICD-10-CM

## 2020-05-03 DIAGNOSIS — F331 Major depressive disorder, recurrent, moderate: Secondary | ICD-10-CM | POA: Diagnosis not present

## 2020-05-03 DIAGNOSIS — M25462 Effusion, left knee: Secondary | ICD-10-CM | POA: Diagnosis present

## 2020-05-03 DIAGNOSIS — Z823 Family history of stroke: Secondary | ICD-10-CM

## 2020-05-03 DIAGNOSIS — Z471 Aftercare following joint replacement surgery: Secondary | ICD-10-CM | POA: Diagnosis not present

## 2020-05-03 HISTORY — PX: TOTAL KNEE ARTHROPLASTY: SHX125

## 2020-05-03 LAB — TYPE AND SCREEN
ABO/RH(D): B POS
Antibody Screen: NEGATIVE

## 2020-05-03 LAB — HCG, SERUM, QUALITATIVE: Preg, Serum: NEGATIVE

## 2020-05-03 SURGERY — ARTHROPLASTY, KNEE, TOTAL
Anesthesia: Spinal | Site: Knee | Laterality: Left

## 2020-05-03 MED ORDER — BUPIVACAINE IN DEXTROSE 0.75-8.25 % IT SOLN
INTRATHECAL | Status: DC | PRN
  Administered 2020-05-03: 1.6 mL via INTRATHECAL

## 2020-05-03 MED ORDER — ONDANSETRON HCL 4 MG/2ML IJ SOLN
INTRAMUSCULAR | Status: DC | PRN
  Administered 2020-05-03: 4 mg via INTRAVENOUS

## 2020-05-03 MED ORDER — DOCUSATE SODIUM 100 MG PO CAPS
100.0000 mg | ORAL_CAPSULE | Freq: Two times a day (BID) | ORAL | Status: DC
  Administered 2020-05-03 – 2020-05-05 (×4): 100 mg via ORAL
  Filled 2020-05-03 (×4): qty 1

## 2020-05-03 MED ORDER — ONDANSETRON HCL 4 MG/2ML IJ SOLN
INTRAMUSCULAR | Status: AC
  Filled 2020-05-03: qty 2

## 2020-05-03 MED ORDER — ADULT MULTIVITAMIN W/MINERALS CH
1.0000 | ORAL_TABLET | Freq: Every day | ORAL | Status: DC
  Administered 2020-05-04 – 2020-05-05 (×2): 1 via ORAL
  Filled 2020-05-03 (×2): qty 1

## 2020-05-03 MED ORDER — CEFAZOLIN SODIUM-DEXTROSE 1-4 GM/50ML-% IV SOLN
1.0000 g | Freq: Four times a day (QID) | INTRAVENOUS | Status: AC
  Administered 2020-05-03 (×2): 1 g via INTRAVENOUS
  Filled 2020-05-03 (×2): qty 50

## 2020-05-03 MED ORDER — PHENYLEPHRINE HCL (PRESSORS) 10 MG/ML IV SOLN
INTRAVENOUS | Status: AC
  Filled 2020-05-03: qty 1

## 2020-05-03 MED ORDER — DEXAMETHASONE SODIUM PHOSPHATE 10 MG/ML IJ SOLN
INTRAMUSCULAR | Status: AC
  Filled 2020-05-03: qty 1

## 2020-05-03 MED ORDER — SODIUM CHLORIDE 0.9 % IV SOLN: INTRAVENOUS | Status: DC

## 2020-05-03 MED ORDER — STERILE WATER FOR IRRIGATION IR SOLN
Status: DC | PRN
  Administered 2020-05-03: 2000 mL

## 2020-05-03 MED ORDER — BUPIVACAINE HCL (PF) 0.25 % IJ SOLN
INTRAMUSCULAR | Status: AC
  Filled 2020-05-03: qty 30

## 2020-05-03 MED ORDER — PHENYLEPHRINE 40 MCG/ML (10ML) SYRINGE FOR IV PUSH (FOR BLOOD PRESSURE SUPPORT)
PREFILLED_SYRINGE | INTRAVENOUS | Status: AC
  Filled 2020-05-03: qty 10

## 2020-05-03 MED ORDER — GABAPENTIN 100 MG PO CAPS
100.0000 mg | ORAL_CAPSULE | Freq: Three times a day (TID) | ORAL | Status: DC
  Administered 2020-05-03 – 2020-05-05 (×6): 100 mg via ORAL
  Filled 2020-05-03 (×6): qty 1

## 2020-05-03 MED ORDER — CHLORHEXIDINE GLUCONATE 0.12 % MT SOLN
15.0000 mL | Freq: Once | OROMUCOSAL | Status: AC
  Administered 2020-05-03: 15 mL via OROMUCOSAL

## 2020-05-03 MED ORDER — POVIDONE-IODINE 10 % EX SWAB
2.0000 "application " | Freq: Once | CUTANEOUS | Status: AC
  Administered 2020-05-03: 2 via TOPICAL

## 2020-05-03 MED ORDER — LACTATED RINGERS IV SOLN: INTRAVENOUS | Status: DC

## 2020-05-03 MED ORDER — BUPIVACAINE-EPINEPHRINE (PF) 0.25% -1:200000 IJ SOLN
INTRAMUSCULAR | Status: AC
  Filled 2020-05-03: qty 30

## 2020-05-03 MED ORDER — ROPIVACAINE HCL 5 MG/ML IJ SOLN
INTRAMUSCULAR | Status: DC | PRN
  Administered 2020-05-03: 30 mL via PERINEURAL

## 2020-05-03 MED ORDER — PROPOFOL 10 MG/ML IV BOLUS
INTRAVENOUS | Status: DC | PRN
  Administered 2020-05-03 (×2): 20 mg via INTRAVENOUS

## 2020-05-03 MED ORDER — ALUM & MAG HYDROXIDE-SIMETH 200-200-20 MG/5ML PO SUSP
30.0000 mL | ORAL | Status: DC | PRN
  Administered 2020-05-03: 30 mL via ORAL
  Filled 2020-05-03: qty 30

## 2020-05-03 MED ORDER — PHENOL 1.4 % MT LIQD: 1.0000 | OROMUCOSAL | Status: DC | PRN

## 2020-05-03 MED ORDER — ACETAMINOPHEN 325 MG PO TABS
325.0000 mg | ORAL_TABLET | Freq: Four times a day (QID) | ORAL | Status: DC | PRN
  Administered 2020-05-04 – 2020-05-05 (×5): 650 mg via ORAL
  Filled 2020-05-03 (×5): qty 2

## 2020-05-03 MED ORDER — PROPOFOL 500 MG/50ML IV EMUL
INTRAVENOUS | Status: DC | PRN
  Administered 2020-05-03: 100 ug/kg/min via INTRAVENOUS

## 2020-05-03 MED ORDER — 0.9 % SODIUM CHLORIDE (POUR BTL) OPTIME
TOPICAL | Status: DC | PRN
  Administered 2020-05-03: 1000 mL

## 2020-05-03 MED ORDER — AMLODIPINE BESYLATE 10 MG PO TABS
10.0000 mg | ORAL_TABLET | Freq: Every day | ORAL | Status: DC
  Administered 2020-05-04 – 2020-05-05 (×2): 10 mg via ORAL
  Filled 2020-05-03 (×2): qty 1

## 2020-05-03 MED ORDER — PROPOFOL 500 MG/50ML IV EMUL
INTRAVENOUS | Status: AC
  Filled 2020-05-03: qty 50

## 2020-05-03 MED ORDER — PANTOPRAZOLE SODIUM 40 MG PO TBEC
40.0000 mg | DELAYED_RELEASE_TABLET | Freq: Every day | ORAL | Status: DC
  Administered 2020-05-04 – 2020-05-05 (×2): 40 mg via ORAL
  Filled 2020-05-03 (×2): qty 1

## 2020-05-03 MED ORDER — AMISULPRIDE (ANTIEMETIC) 5 MG/2ML IV SOLN: 10.0000 mg | Freq: Once | INTRAVENOUS | Status: DC | PRN

## 2020-05-03 MED ORDER — ONDANSETRON HCL 4 MG/2ML IJ SOLN: 4.0000 mg | Freq: Once | INTRAMUSCULAR | Status: DC | PRN

## 2020-05-03 MED ORDER — METOCLOPRAMIDE HCL 5 MG/ML IJ SOLN: 5.0000 mg | Freq: Three times a day (TID) | INTRAMUSCULAR | Status: DC | PRN

## 2020-05-03 MED ORDER — ONDANSETRON HCL 4 MG PO TABS: 4.0000 mg | ORAL_TABLET | Freq: Four times a day (QID) | ORAL | Status: DC | PRN

## 2020-05-03 MED ORDER — DIPHENHYDRAMINE HCL 12.5 MG/5ML PO ELIX
12.5000 mg | ORAL_SOLUTION | ORAL | Status: DC | PRN
  Administered 2020-05-04: 12.5 mg via ORAL
  Filled 2020-05-03: qty 5

## 2020-05-03 MED ORDER — OXYCODONE HCL 5 MG PO TABS
10.0000 mg | ORAL_TABLET | ORAL | Status: DC | PRN
  Administered 2020-05-03: 15 mg via ORAL
  Administered 2020-05-04 (×2): 10 mg via ORAL
  Administered 2020-05-04: 15 mg via ORAL
  Filled 2020-05-03 (×2): qty 3
  Filled 2020-05-03: qty 2

## 2020-05-03 MED ORDER — LACTATED RINGERS IV SOLN: INTRAVENOUS | Status: DC | PRN

## 2020-05-03 MED ORDER — POLYETHYLENE GLYCOL 3350 17 G PO PACK: 17.0000 g | PACK | Freq: Every day | ORAL | Status: DC | PRN

## 2020-05-03 MED ORDER — TRANEXAMIC ACID-NACL 1000-0.7 MG/100ML-% IV SOLN
1000.0000 mg | INTRAVENOUS | Status: AC
  Administered 2020-05-03: 1000 mg via INTRAVENOUS
  Filled 2020-05-03: qty 100

## 2020-05-03 MED ORDER — METOCLOPRAMIDE HCL 5 MG PO TABS: 5.0000 mg | ORAL_TABLET | Freq: Three times a day (TID) | ORAL | Status: DC | PRN

## 2020-05-03 MED ORDER — OXYCODONE HCL 5 MG PO TABS
5.0000 mg | ORAL_TABLET | Freq: Once | ORAL | Status: DC | PRN
Start: 2020-05-03 — End: 2020-05-03

## 2020-05-03 MED ORDER — DOXYCYCLINE HYCLATE 100 MG PO TABS: 100.0000 mg | ORAL_TABLET | Freq: Two times a day (BID) | ORAL | Status: DC

## 2020-05-03 MED ORDER — FENTANYL CITRATE (PF) 100 MCG/2ML IJ SOLN
50.0000 ug | INTRAMUSCULAR | Status: DC
  Administered 2020-05-03: 50 ug via INTRAVENOUS
  Filled 2020-05-03: qty 2

## 2020-05-03 MED ORDER — LIDOCAINE HCL (PF) 2 % IJ SOLN
INTRAMUSCULAR | Status: AC
  Filled 2020-05-03: qty 5

## 2020-05-03 MED ORDER — PHENYLEPHRINE 40 MCG/ML (10ML) SYRINGE FOR IV PUSH (FOR BLOOD PRESSURE SUPPORT)
PREFILLED_SYRINGE | INTRAVENOUS | Status: DC | PRN
  Administered 2020-05-03 (×2): 80 ug via INTRAVENOUS

## 2020-05-03 MED ORDER — HYDROMORPHONE HCL 1 MG/ML IJ SOLN
0.5000 mg | INTRAMUSCULAR | Status: DC | PRN
  Administered 2020-05-03 (×2): 1 mg via INTRAVENOUS
  Filled 2020-05-03 (×2): qty 1

## 2020-05-03 MED ORDER — METRONIDAZOLE 500 MG PO TABS: 250.0000 mg | ORAL_TABLET | Freq: Four times a day (QID) | ORAL | Status: DC

## 2020-05-03 MED ORDER — ONDANSETRON HCL 4 MG/2ML IJ SOLN: 4.0000 mg | Freq: Four times a day (QID) | INTRAMUSCULAR | Status: DC | PRN

## 2020-05-03 MED ORDER — DEXAMETHASONE SODIUM PHOSPHATE 10 MG/ML IJ SOLN
INTRAMUSCULAR | Status: DC | PRN
  Administered 2020-05-03: 10 mg via INTRAVENOUS

## 2020-05-03 MED ORDER — METHOCARBAMOL 500 MG PO TABS
500.0000 mg | ORAL_TABLET | Freq: Four times a day (QID) | ORAL | Status: DC | PRN
  Administered 2020-05-03 – 2020-05-05 (×5): 500 mg via ORAL
  Filled 2020-05-03 (×5): qty 1

## 2020-05-03 MED ORDER — SODIUM CHLORIDE 0.9 % IR SOLN
Status: DC | PRN
  Administered 2020-05-03: 1000 mL

## 2020-05-03 MED ORDER — FENTANYL CITRATE (PF) 100 MCG/2ML IJ SOLN: 25.0000 ug | INTRAMUSCULAR | Status: DC | PRN

## 2020-05-03 MED ORDER — ASPIRIN 81 MG PO CHEW
81.0000 mg | CHEWABLE_TABLET | Freq: Two times a day (BID) | ORAL | Status: DC
  Administered 2020-05-03 – 2020-05-05 (×4): 81 mg via ORAL
  Filled 2020-05-03 (×4): qty 1

## 2020-05-03 MED ORDER — OXYCODONE HCL 5 MG PO TABS
5.0000 mg | ORAL_TABLET | ORAL | Status: DC | PRN
  Administered 2020-05-03 – 2020-05-04 (×3): 10 mg via ORAL
  Administered 2020-05-04 – 2020-05-05 (×2): 5 mg via ORAL
  Filled 2020-05-03 (×3): qty 2
  Filled 2020-05-03: qty 1
  Filled 2020-05-03: qty 2
  Filled 2020-05-03: qty 1

## 2020-05-03 MED ORDER — OXYCODONE HCL 5 MG/5ML PO SOLN: 5.0000 mg | Freq: Once | ORAL | Status: DC | PRN

## 2020-05-03 MED ORDER — LIDOCAINE HCL (CARDIAC) PF 100 MG/5ML IV SOSY
PREFILLED_SYRINGE | INTRAVENOUS | Status: DC | PRN
  Administered 2020-05-03: 60 mg via INTRAVENOUS

## 2020-05-03 MED ORDER — MIDAZOLAM HCL 2 MG/2ML IJ SOLN
1.0000 mg | INTRAMUSCULAR | Status: DC
  Administered 2020-05-03: 2 mg via INTRAVENOUS
  Filled 2020-05-03: qty 2

## 2020-05-03 MED ORDER — CELECOXIB 200 MG PO CAPS
200.0000 mg | ORAL_CAPSULE | Freq: Every day | ORAL | Status: DC
  Administered 2020-05-04 – 2020-05-05 (×2): 200 mg via ORAL
  Filled 2020-05-03 (×2): qty 1

## 2020-05-03 MED ORDER — METHOCARBAMOL 1000 MG/10ML IJ SOLN
500.0000 mg | Freq: Four times a day (QID) | INTRAVENOUS | Status: DC | PRN
  Filled 2020-05-03: qty 5

## 2020-05-03 MED ORDER — CEFAZOLIN SODIUM-DEXTROSE 2-4 GM/100ML-% IV SOLN
2.0000 g | INTRAVENOUS | Status: AC
  Administered 2020-05-03: 2 g via INTRAVENOUS
  Filled 2020-05-03: qty 100

## 2020-05-03 MED ORDER — PROPOFOL 1000 MG/100ML IV EMUL
INTRAVENOUS | Status: AC
  Filled 2020-05-03: qty 200

## 2020-05-03 MED ORDER — BUPIVACAINE-EPINEPHRINE 0.25% -1:200000 IJ SOLN
INTRAMUSCULAR | Status: DC | PRN
  Administered 2020-05-03: 30 mL

## 2020-05-03 MED ORDER — MENTHOL 3 MG MT LOZG: 1.0000 | LOZENGE | OROMUCOSAL | Status: DC | PRN

## 2020-05-03 MED ORDER — ORAL CARE MOUTH RINSE: 15.0000 mL | Freq: Once | OROMUCOSAL | Status: AC

## 2020-05-03 SURGICAL SUPPLY — 57 items
APL SKNCLS STERI-STRIP NONHPOA (GAUZE/BANDAGES/DRESSINGS)
BAG SPEC THK2 15X12 ZIP CLS (MISCELLANEOUS)
BAG ZIPLOCK 12X15 (MISCELLANEOUS) IMPLANT
BASEPLATE TIBIAL TRIATH (Orthopedic Implant) ×1 IMPLANT
BENZOIN TINCTURE PRP APPL 2/3 (GAUZE/BANDAGES/DRESSINGS) IMPLANT
BLADE SAG 18X100X1.27 (BLADE) IMPLANT
BLADE SURG SZ10 CARB STEEL (BLADE) ×4 IMPLANT
BNDG ELASTIC 6X5.8 VLCR STR LF (GAUZE/BANDAGES/DRESSINGS) ×3 IMPLANT
BOWL SMART MIX CTS (DISPOSABLE) IMPLANT
BSPLAT TIB 4 UNV CMNT TL STAB (Orthopedic Implant) ×1 IMPLANT
CEMENT BONE SIMPLEX SPEEDSET (Cement) ×2 IMPLANT
COVER SURGICAL LIGHT HANDLE (MISCELLANEOUS) ×2 IMPLANT
COVER WAND RF STERILE (DRAPES) IMPLANT
CUFF TOURN SGL QUICK 34 (TOURNIQUET CUFF) ×2
CUFF TRNQT CYL 34X4.125X (TOURNIQUET CUFF) ×1 IMPLANT
DECANTER SPIKE VIAL GLASS SM (MISCELLANEOUS) IMPLANT
DRAPE U-SHAPE 47X51 STRL (DRAPES) ×2 IMPLANT
DRSG PAD ABDOMINAL 8X10 ST (GAUZE/BANDAGES/DRESSINGS) ×3 IMPLANT
DURAPREP 26ML APPLICATOR (WOUND CARE) ×2 IMPLANT
ELECT BLADE TIP CTD 4 INCH (ELECTRODE) ×2 IMPLANT
ELECT REM PT RETURN 15FT ADLT (MISCELLANEOUS) ×2 IMPLANT
FEMORAL PEG DISTAL FIXATION (Orthopedic Implant) ×1 IMPLANT
FEMORAL POSTERIOR STAB SZ4 (Orthopedic Implant) ×1 IMPLANT
GAUZE SPONGE 4X4 12PLY STRL (GAUZE/BANDAGES/DRESSINGS) ×2 IMPLANT
GAUZE XEROFORM 1X8 LF (GAUZE/BANDAGES/DRESSINGS) ×1 IMPLANT
GLOVE BIO SURGEON STRL SZ7.5 (GLOVE) ×2 IMPLANT
GLOVE BIOGEL PI IND STRL 8 (GLOVE) ×2 IMPLANT
GLOVE BIOGEL PI INDICATOR 8 (GLOVE) ×2
GLOVE ECLIPSE 8.0 STRL XLNG CF (GLOVE) ×2 IMPLANT
GOWN STRL REUS W/TWL XL LVL3 (GOWN DISPOSABLE) ×4 IMPLANT
HANDPIECE INTERPULSE COAX TIP (DISPOSABLE) ×2
HOLDER FOLEY CATH W/STRAP (MISCELLANEOUS) ×1 IMPLANT
IMMOBILIZER KNEE 20 (SOFTGOODS) ×2
IMMOBILIZER KNEE 20 THIGH 36 (SOFTGOODS) ×1 IMPLANT
INSERT TIBIAL STABILIZ 4 13MM (Insert) ×1 IMPLANT
KIT TURNOVER KIT A (KITS) IMPLANT
NS IRRIG 1000ML POUR BTL (IV SOLUTION) ×2 IMPLANT
PACK TOTAL KNEE CUSTOM (KITS) ×2 IMPLANT
PADDING CAST COTTON 6X4 STRL (CAST SUPPLIES) ×4 IMPLANT
PATELLA TRIATHLON SZ 29 9 MM (Orthopedic Implant) ×1 IMPLANT
PENCIL SMOKE EVACUATOR (MISCELLANEOUS) ×1 IMPLANT
PIN FLUTED HEDLESS FIX 3.5X1/8 (PIN) ×1 IMPLANT
PROTECTOR NERVE ULNAR (MISCELLANEOUS) ×2 IMPLANT
SET HNDPC FAN SPRY TIP SCT (DISPOSABLE) ×1 IMPLANT
SET PAD KNEE POSITIONER (MISCELLANEOUS) ×2 IMPLANT
STAPLER VISISTAT 35W (STAPLE) ×1 IMPLANT
STRIP CLOSURE SKIN 1/2X4 (GAUZE/BANDAGES/DRESSINGS) IMPLANT
SUT MNCRL AB 4-0 PS2 18 (SUTURE) IMPLANT
SUT VIC AB 0 CT1 27 (SUTURE) ×2
SUT VIC AB 0 CT1 27XBRD ANTBC (SUTURE) ×1 IMPLANT
SUT VIC AB 1 CT1 36 (SUTURE) ×4 IMPLANT
SUT VIC AB 2-0 CT1 27 (SUTURE) ×4
SUT VIC AB 2-0 CT1 TAPERPNT 27 (SUTURE) ×2 IMPLANT
TRAY FOLEY MTR SLVR 14FR STAT (SET/KITS/TRAYS/PACK) ×1 IMPLANT
WATER STERILE IRR 1000ML POUR (IV SOLUTION) ×2 IMPLANT
WRAP KNEE MAXI GEL POST OP (GAUZE/BANDAGES/DRESSINGS) ×1 IMPLANT
YANKAUER SUCT BULB TIP 10FT TU (MISCELLANEOUS) ×1 IMPLANT

## 2020-05-03 NOTE — Brief Op Note (Signed)
05/03/2020  10:26 AM  PATIENT:  Elizabeth Bush  49 y.o. female  PRE-OPERATIVE DIAGNOSIS:  Osteoarthritis Left Knee  POST-OPERATIVE DIAGNOSIS:  Osteoarthritis Left Knee  PROCEDURE:  Procedure(s): LEFT TOTAL KNEE ARTHROPLASTY (Left)  SURGEON:  Surgeon(s) and Role:    [name], MD - Primary  PHYSICIAN ASSISTANT:  Benita Stabile, PA-C  ANESTHESIA:   local, regional and spinal  EBL:  25 mL   COUNTS:  YES  TOURNIQUET:  * Missing tourniquet times found for documented tourniquets in log: [id number] *  DICTATION: .Other Dictation: Dictation Number [id number]  PLAN OF CARE: Admit for overnight observation  PATIENT DISPOSITION:  PACU - hemodynamically stable.   Delay start of Pharmacological VTE agent (>24hrs) due to surgical blood loss or risk of bleeding: no

## 2020-05-03 NOTE — Progress Notes (Signed)
Paged Surgeron on call and gave update on patient's status.   Heart Rate decreased.  Patient daughter at bedside.   Will continue to monitor.

## 2020-05-03 NOTE — Plan of Care (Signed)

## 2020-05-03 NOTE — Progress Notes (Signed)
Walked by patient's room and a nurse and tech were at bedside.  Patient had ripped out her foley and her IV.  Patient's HR was elevated per continuous Pulse Oximetry. (180's)  Paged MD on call

## 2020-05-03 NOTE — Anesthesia Procedure Notes (Signed)
Anesthesia Regional Block: Adductor canal block   Pre-Anesthetic Checklist: ,, timeout performed, Correct Patient, Correct Site, Correct Laterality, Correct Procedure, Correct Position, site marked, Risks and benefits discussed,  Surgical consent,  Pre-op evaluation,  At surgeon's request and post-op pain management  Laterality: Left  Prep: chloraprep       Needles:  Injection technique: Single-shot  Needle Type: Echogenic Stimulator Needle     Needle Length: 10cm  Needle Gauge: 20     Additional Needles:   Procedures:,,,, ultrasound used (permanent image in chart),,,,  Narrative:  Start time: 05/03/2020 7:50 AM End time: 05/03/2020 7:54 AM Injection made incrementally with aspirations every 5 mL.  Performed by: Personally  Anesthesiologist: Lidia Collum, MD  Additional Notes: Standard monitors applied. Skin prepped. Good needle visualization with ultrasound. Injection made in 5cc increments with no resistance to injection. Patient tolerated the procedure well.

## 2020-05-03 NOTE — Anesthesia Postprocedure Evaluation (Signed)
Anesthesia Post Note  Patient: Elizabeth Bush  Procedure(s) Performed: LEFT TOTAL KNEE ARTHROPLASTY (Left Knee)     Patient location during evaluation: PACU Anesthesia Type: Spinal Level of consciousness: oriented and awake and alert Pain management: pain level controlled Vital Signs Assessment: post-procedure vital signs reviewed and stable Respiratory status: spontaneous breathing, respiratory function stable and nonlabored ventilation Cardiovascular status: blood pressure returned to baseline and stable Postop Assessment: no headache, no backache, no apparent nausea or vomiting and spinal receding Anesthetic complications: no   No complications documented.  Last Vitals:  Vitals:   05/03/20 1400 05/03/20 1506  BP: (!) 148/99 (!) 132/98  Pulse: (!) 104 (!) 110  Resp: 17 18  Temp: 36.4 C 36.4 C  SpO2: 98% 93%    Last Pain:  Vitals:   05/03/20 1506  TempSrc: Oral  PainSc: [place]

## 2020-05-03 NOTE — Anesthesia Procedure Notes (Signed)
Spinal  Patient location during procedure: OR End time: 05/03/2020 8:46 AM Staffing Performed: resident/CRNA  Resident/CRNA: Caryl Pina T, CRNA Preanesthetic Checklist Completed: patient identified, IV checked, site marked, risks and benefits discussed, surgical consent, monitors and equipment checked, pre-op evaluation and timeout performed Spinal Block Patient position: sitting Prep: DuraPrep Patient monitoring: heart rate, cardiac monitor, continuous pulse ox and blood pressure Approach: midline Location: L3-4 Injection technique: single-shot Needle Needle type: Pencan  Needle gauge: 24 G Needle length: 9 cm Assessment Sensory level: T4 Additional Notes IV functioning, monitors applied to pt. Expiration date of kit checked and confirmed to be in date. Sterile prep and drape, hand hygiene and sterile gloved used. Pt was positioned and spine was prepped in sterile fashion. Skin was anesthetized with lidocaine. Free flow of clear CSF obtained prior to injecting local anesthetic into CSF x 1 attempt. Spinal needle aspirated freely following injection. Needle was carefully withdrawn, and pt tolerated procedure well. Loss of motor and sensory on exam post injection.

## 2020-05-03 NOTE — Interval H&P Note (Signed)
History and Physical Interval Note: The patient is here today for scheduled left total knee arthroplasty to treat her severe end-stage arthritis of the left knee.  There has been no significant interval change in her medical status other than receiving a transfusion.  The risk and benefits of surgery been explained in detail and informed consent is obtained.  See recent H&P.  The left knee has been marked.  05/03/2020 7:05 AM  Elizabeth Bush  has presented today for surgery, with the diagnosis of Osteoarthritis Left Knee.  The various methods of treatment have been discussed with the patient and family. After consideration of risks, benefits and other options for treatment, the patient has consented to  Procedure(s): LEFT TOTAL KNEE ARTHROPLASTY (Left) as a surgical intervention.  The patient's history has been reviewed, patient examined, no change in status, stable for surgery.  I have reviewed the patient's chart and labs.  Questions were answered to the patient's satisfaction.     [name]

## 2020-05-03 NOTE — Transfer of Care (Signed)
Immediate Anesthesia Transfer of Care Note  Patient: Elizabeth Bush  Procedure(s) Performed: LEFT TOTAL KNEE ARTHROPLASTY (Left Knee)  Patient Location: PACU  Anesthesia Type:Spinal  Level of Consciousness: awake, alert  and oriented  Airway & Oxygen Therapy: Patient Spontanous Breathing and Patient connected to face mask oxygen  Post-op Assessment: Report given to RN and Post -op Vital signs reviewed and stable  Post vital signs: Reviewed and stable  Last Vitals:  Vitals Value Taken Time  BP 128/87 05/03/20 1051  Temp    Pulse 86 05/03/20 1052  Resp 17 05/03/20 1052  SpO2 100 % 05/03/20 1052  Vitals shown include unvalidated device data.  Last Pain:  Vitals:   05/03/20 0652  TempSrc:   PainSc: 7       Patients Stated Pain Goal: 4 (12/92/90 9030)  Complications: No complications documented.

## 2020-05-03 NOTE — Evaluation (Signed)
Physical Therapy Evaluation Patient Details Name: Elizabeth Bush MRN: [id number] DOB: 10-19-1970 Today's Date: 05/03/2020   History of Present Illness  Pt s/p L TKR  Clinical Impression  Pt s/p L TKR and presents with decreased L LE strength/ROM, post op pain, balance deficits, and limited use of R LE 2* OA changes.  Pt should progress to dc home with assist of family and "my friend".    Follow Up Recommendations Follow surgeon's recommendation for DC plan and follow-up therapies    Equipment Recommendations  Rolling walker with 5" wheels;3in1 (PT)    Recommendations for Other Services OT consult     Precautions / Restrictions Precautions Precautions: Knee;Fall Required Braces or Orthoses: Knee Immobilizer - Left Knee Immobilizer - Left: Discontinue once straight leg raise with < 10 degree lag Restrictions Weight Bearing Restrictions: No LLE Weight Bearing: Weight bearing as tolerated      Mobility  Bed Mobility Overal bed mobility: Needs Assistance Bed Mobility: Supine to Sit     Supine to sit: Min assist;Mod assist     General bed mobility comments: INcreased time with cues for sequence and use of R LE to self assist.  Physical assist to manage L LE and to complete transition to EOB sitting    Transfers Overall transfer level: Needs assistance Equipment used: Rolling walker (2 wheeled) Transfers: Sit to/from Stand Sit to Stand: From elevated surface;Min assist;Mod assist;+2 physical assistance;+2 safety/equipment         General transfer comment: cues for LE management and use of UEs to self assist;  Physical assist to bring wt up and fwd and to balance in standing  Ambulation/Gait Ambulation/Gait assistance: Min assist;Mod assist;+2 physical assistance;+2 safety/equipment Gait Distance (Feet): 19 Feet Assistive device: Rolling walker (2 wheeled) Gait Pattern/deviations: Step-to pattern;Decreased step length - right;Decreased step length - left;Trunk  flexed;Shuffle Gait velocity: decr   General Gait Details: cues for sequence, posture, position from [employer]            Wheelchair Mobility    Modified Rankin (Stroke Patients Only)       Balance Overall balance assessment: Needs assistance Sitting-balance support: No upper extremity supported;Feet supported Sitting balance-Leahy Scale: Good     Standing balance support: Bilateral upper extremity supported Standing balance-Leahy Scale: Poor                               Pertinent Vitals/Pain Pain Assessment: 0-10 Pain Score: 4  Pain Location: L knee Pain Descriptors / Indicators: Aching;Sore Pain Intervention(s): Limited activity within patient's tolerance;Monitored during session;Premedicated before session    [place] expects to be discharged to:: Private residence Living Arrangements: Alone Available Help at Discharge: Available 24 hours/day;Friend(s) Type of Home: Apartment Home Access: Level entry     Home Layout: Two level Home Equipment: [place] - single point      Prior Function Level of Independence: Needs assistance   Gait / Transfers Assistance Needed: use of cane  ADL's / Homemaking Assistance Needed: assist of family        Hand Dominance        Extremity/Trunk Assessment   Upper Extremity Assessment Upper Extremity Assessment: Overall WFL for tasks assessed    Lower Extremity Assessment Lower Extremity Assessment: RLE deficits/detail;LLE deficits/detail RLE Deficits / Details: significant OA with noted venu valgum deformity    Cervical / Trunk Assessment Cervical / Trunk Assessment: Normal  Communication   Communication: No difficulties  Cognition Arousal/Alertness: Awake/alert Behavior During Therapy: WFL for tasks assessed/performed Overall Cognitive Status: Within Functional Limits for tasks assessed                                        General Comments       Exercises Total Joint Exercises Ankle Circles/Pumps: AROM   Assessment/Plan    PT Assessment Patient needs continued PT services  PT Problem List Decreased range of motion;Decreased strength;Decreased activity tolerance;Decreased balance;Decreased mobility;Decreased knowledge of use of DME;Pain       PT Treatment Interventions DME instruction;Gait training;Stair training;Functional mobility training;Therapeutic activities;Therapeutic exercise;Patient/family education;Balance training    PT Goals (Current goals can be found in the Care Plan section)  Acute Rehab PT Goals Patient Stated Goal: Regain IND PT Goal Formulation: With patient Time For Goal Achievement: 05/10/20 Potential to Achieve Goals: Good    Frequency 7X/week   Barriers to discharge Other (comment) Two level home    Co-evaluation               AM-PAC PT "6 Clicks" Mobility  Outcome Measure Help needed turning from your back to your side while in a flat bed without using bedrails?: A Lot Help needed moving from lying on your back to sitting on the side of a flat bed without using bedrails?: A Lot Help needed moving to and from a bed to a chair (including a wheelchair)?: A Lot Help needed standing up from a chair using your arms (e.g., wheelchair or bedside chair)?: A Lot Help needed to walk in hospital room?: A Lot Help needed climbing 3-5 steps with a railing? : A Lot 6 Click Score: 12    End of Session Equipment Utilized During Treatment: Gait belt;Left knee immobilizer Activity Tolerance: Patient limited by fatigue Patient left: in chair;with call bell/phone within reach;with chair alarm set Nurse Communication: Mobility status PT Visit Diagnosis: Difficulty in walking, not elsewhere classified (R26.2)    Time: 6301-6010 PT Time Calculation (min) (ACUTE ONLY): 35 min   Charges:   PT Evaluation $PT Eval Low Complexity: 1 Low PT Treatments $Gait Training: 8-22 mins        [place] Pager [phone] Office [phone]   Rashied Corallo 05/03/2020, 6:18 PM

## 2020-05-03 NOTE — Progress Notes (Signed)
AssistedDr. Carolyn Witman with left, ultrasound guided, adductor canal block. Side rails up, monitors on throughout procedure. See vital signs in flow sheet. Tolerated Procedure well.  

## 2020-05-04 DIAGNOSIS — M1712 Unilateral primary osteoarthritis, left knee: Secondary | ICD-10-CM | POA: Diagnosis not present

## 2020-05-04 DIAGNOSIS — I10 Essential (primary) hypertension: Secondary | ICD-10-CM | POA: Diagnosis not present

## 2020-05-04 DIAGNOSIS — F17210 Nicotine dependence, cigarettes, uncomplicated: Secondary | ICD-10-CM | POA: Diagnosis not present

## 2020-05-04 DIAGNOSIS — M170 Bilateral primary osteoarthritis of knee: Secondary | ICD-10-CM | POA: Diagnosis not present

## 2020-05-04 DIAGNOSIS — Z825 Family history of asthma and other chronic lower respiratory diseases: Secondary | ICD-10-CM | POA: Diagnosis not present

## 2020-05-04 DIAGNOSIS — Z823 Family history of stroke: Secondary | ICD-10-CM | POA: Diagnosis not present

## 2020-05-04 DIAGNOSIS — M25462 Effusion, left knee: Secondary | ICD-10-CM | POA: Diagnosis present

## 2020-05-04 DIAGNOSIS — M659 Synovitis and tenosynovitis, unspecified: Secondary | ICD-10-CM | POA: Diagnosis present

## 2020-05-04 DIAGNOSIS — K219 Gastro-esophageal reflux disease without esophagitis: Secondary | ICD-10-CM | POA: Diagnosis not present

## 2020-05-04 DIAGNOSIS — Z79899 Other long term (current) drug therapy: Secondary | ICD-10-CM | POA: Diagnosis not present

## 2020-05-04 DIAGNOSIS — Z888 Allergy status to other drugs, medicaments and biological substances status: Secondary | ICD-10-CM | POA: Diagnosis not present

## 2020-05-04 DIAGNOSIS — D62 Acute posthemorrhagic anemia: Secondary | ICD-10-CM | POA: Diagnosis not present

## 2020-05-04 DIAGNOSIS — Z833 Family history of diabetes mellitus: Secondary | ICD-10-CM | POA: Diagnosis not present

## 2020-05-04 DIAGNOSIS — Z8249 Family history of ischemic heart disease and other diseases of the circulatory system: Secondary | ICD-10-CM | POA: Diagnosis not present

## 2020-05-04 DIAGNOSIS — F331 Major depressive disorder, recurrent, moderate: Secondary | ICD-10-CM | POA: Diagnosis not present

## 2020-05-04 DIAGNOSIS — F419 Anxiety disorder, unspecified: Secondary | ICD-10-CM | POA: Diagnosis not present

## 2020-05-04 LAB — CBC
HCT: 28.3 % — ABNORMAL LOW (ref 36.0–46.0)
Hemoglobin: 8.8 g/dL — ABNORMAL LOW (ref 12.0–15.0)
MCH: 28.6 pg (ref 26.0–34.0)
MCHC: 31.1 g/dL (ref 30.0–36.0)
MCV: 91.9 fL (ref 80.0–100.0)
Platelets: 226 10*3/uL (ref 150–400)
RBC: 3.08 MIL/uL — ABNORMAL LOW (ref 3.87–5.11)
RDW: 23.7 % — ABNORMAL HIGH (ref 11.5–15.5)
WBC: 15.2 10*3/uL — ABNORMAL HIGH (ref 4.0–10.5)
nRBC: 0 % (ref 0.0–0.2)

## 2020-05-04 LAB — BASIC METABOLIC PANEL
Anion gap: 12 (ref 5–15)
BUN: 8 mg/dL (ref 6–20)
CO2: 21 mmol/L — ABNORMAL LOW (ref 22–32)
Calcium: 8.5 mg/dL — ABNORMAL LOW (ref 8.9–10.3)
Chloride: 100 mmol/L (ref 98–111)
Creatinine, Ser: 0.78 mg/dL (ref 0.44–1.00)
GFR, Estimated: >60 mL/min (ref >60)
Glucose, Bld: 132 mg/dL — ABNORMAL HIGH (ref 70–99)
Potassium: 2.9 mmol/L — ABNORMAL LOW (ref 3.5–5.1)
Sodium: 133 mmol/L — ABNORMAL LOW (ref 135–145)

## 2020-05-04 MED ORDER — ASPIRIN 81 MG PO CHEW: 81.0000 mg | CHEWABLE_TABLET | Freq: Two times a day (BID) | ORAL | 0 refills | Status: DC

## 2020-05-04 MED ORDER — OXYCODONE HCL 5 MG PO TABS: 5.0000 mg | ORAL_TABLET | ORAL | 0 refills | Status: DC | PRN

## 2020-05-04 MED ORDER — METHOCARBAMOL 500 MG PO TABS: 500.0000 mg | ORAL_TABLET | Freq: Four times a day (QID) | ORAL | 0 refills | Status: DC | PRN

## 2020-05-04 NOTE — Discharge Instructions (Signed)

## 2020-05-04 NOTE — Op Note (Signed)
[name], [name] MEDICAL RECORD [id number] ACCOUNT [id number] DATE OF BIRTH:1971-04-26 FACILITY: [facility] LOCATION: [facility] PHYSICIAN:Dorothy Landgrebe Kerry Fort, MD  OPERATIVE REPORT  DATE OF PROCEDURE:  05/03/2020  PREOPERATIVE DIAGNOSIS:  Primary osteoarthritis and degenerative joint disease with significant varus malalignment, left knee.  POSTOPERATIVE DIAGNOSIS:  Primary osteoarthritis and degenerative joint disease with significant varus malalignment, left knee.  PROCEDURE:  Cemented left total knee arthroplasty.  IMPLANTS:  Stryker Triathlon cemented knee system with size 4 femur, size 4 universal baseplate, size 13 constrained fixed bearing polyethylene insert, size 29 patellar button.  SURGEON:  Lind Guest. Ninfa Linden, MD  ASSISTANT:  Erskine Emery, PA-C.  ANESTHESIA: 1.  Left lower extremity adductor canal block. 2.  Spinal. 3.  Local with 0.25% Marcaine with epinephrine around the arthrotomy.  TOURNIQUET TIME:  Just over 1 hour.  ESTIMATED BLOOD LOSS:  Less than 100 mL.  ANTIBIOTICS:  Two grams IV Ancef.  COMPLICATIONS:  None.  INDICATIONS:  This is only 49 year old female well known to me.  She has debilitating arthritis of actually both her knees with significant varus malalignment.  She is not morbidly obese either.  She has had several arthroscopic interventions on her  knees and to the point where her knee pain is detrimentally affecting her mobility, her quality of life and her activities of daily living.  Her left knee x-rays show complete loss of medial joint space with osteophytes in all 3 compartments.  There is  significant varus malalignment as well.  She has had recurrent effusions.  She has tried and failed all forms of conservative treatment.  At this point, her knee pain is daily and is detrimentally affecting her mobility, her quality of life and her  activities of daily living to the point she does wish to proceed with a total knee arthroplasty and I  have agreed with this as well.  She understands with this type of surgery, there is the risk of acute blood loss anemia, nerve or vessel injury,  fracture, infection, DVT, and implant failure.  She understands our goals are to decrease pain, improve mobility and overall improve quality of life.  DESCRIPTION OF PROCEDURE:  After informed consent was obtained and appropriate left knee was marked, an adductor canal block was obtained to the left lower extremity in the holding room.  She was then brought to the operating room and sat up on the  operating table where spinal anesthesia was then obtained.  She was then laid in supine position on the operating table.  A Foley catheter was placed and nonsterile tourniquet was placed around her upper left thigh and her left thigh, knee, leg, and  ankle were prepped and draped with DuraPrep and sterile drapes including a sterile stockinette.  Time-out was called to identify correct patient, correct left knee.  We then used an Esmarch to wrap that leg and tourniquet was inflated to 300 mm of  pressure.  I then made a direct midline incision over the patella and carried this proximally and distally.  We dissected down the knee joint and carried out a medial parapatellar arthrotomy, finding a very large joint effusion and significant synovitis  throughout the knee.  There was also significant deformity.  With the knee in a flexed position, we removed what was left of the medial meniscus as well as ACL, PCL and lateral meniscus.  We used extramedullary cutting guide for setting our proximal  tibia cut, taking 9 mm off the high side, correcting varus and valgus and  neutral slope.  I actually backed this down 2 more millimeters after seeing where the cutting block was.  We made this cut without difficulty.  We then used an intramedullary guide  for femur for making our distal femoral cut setting this for a left knee at 5 degrees externally rotated and taking a 10 mm distal  femoral cut due to slight flexion contracture.  We made this cut without difficulty and brought the knee back down to full  extension and actually had achieved full extension, a little bit hyperextension after that.  We then went back to the femur and put our femoral sizing guide based off the epicondylar axis and Whiteside's line.  Based off this, we chose a size 4 femur.   We put a 4-in-1 cutting block for a size 4 femur, made our anterior and posterior cuts, followed by our chamfer cuts.  We then made our femoral box cut for a size 4.  Attention was then turned back to the tibia.  We chose a size 4 tibial tray for  coverage.  We made our keel punch off of this.  We did set the rotation off the tibial tubercle and the femur.  We also drilled for universal baseplate.  Given the significance of sclerotic bone and knowing that I would likely need to use a  semi-constrained insert given her instability.  I then trialed a size 4 left femur followed by size 4 tibial baseplate and an 11 mm fixed bearing polyethylene insert.  I felt that the final should be 13.  We then made our patellar cut and drilled 3 holes  for a size 29 patellar button.  We then removed all instrumentation from the knee and irrigated the knee with normal saline solution using pulsatile lavage.  I placed our Marcaine with epinephrine around the arthrotomy.  We then dried the knee real well  and mixed our cement.  With the knee in a flexed position, we cemented our Stryker Triathlon tibial baseplate, which was a universal baseplate, size 4.  We cemented our size 4 femur.  We placed our 13 mm fixed bearing constrained polyethylene insert and  cemented our size 29 patellar button.  We then held the knee in a fully extended position to compress the knee while the cement hardened.  Once it hardened, we let the tourniquet down.  Hemostasis was obtained with electrocautery.  I put her through  several cycles of motion.  We were pleased with range of  motion and stability and hopefully decreasing her deformity.  We then let the tourniquet down.  Hemostasis obtained with electrocautery.  We closed the arthrotomy with interrupted #1 Vicryl suture  followed by 2-0 Vicryl to close the subcutaneous tissue and interrupted staples to close the skin.  Xeroform and well-padded sterile dressing was applied.  She was taken to recovery room in stable condition with all final counts being correct and no  complications noted.  Of note, Erskine Emery, PA-C did assist during the entire case from beginning to end and his assistance was crucial throughout every aspect of this case.  HN/NUANCE  D:05/03/2020 T:05/04/2020 JOB:[id number]

## 2020-05-04 NOTE — TOC Progression Note (Signed)
Transition of Care [facility]) - Progression Note    Patient Details  Name: Elizabeth Bush MRN: [id number] Date of Birth: 1970-08-30  [facility] CM/SW Contact  Joaquin Courts, RN Phone Number: 05/04/2020, 1:38 PM  Clinical Narrative:    CM spoke with patient's daughter who reports patient has rolling walker.  [employer] to deliver 3in1 to bedside.  [facility] to provide HHPT.   Expected Discharge Plan: [place] Barriers to Discharge: No Barriers Identified  Expected Discharge Plan and Services Expected Discharge Plan: [place]   Discharge Planning Services: CM Consult Post Acute Care Choice: [place] arrangements for the past 2 months: Single Family Home                 DME Arranged: 3-N-1 DME Agency: [employer] Date DME Agency Contacted: 05/04/20 Time DME Agency Contacted: [phone] Representative spoke with at DME Agency: [place]: PT [place]: [facility] [facility] [employer]     Representative spoke with at [place]: pre-arranged in MD office   Social Determinants of Health ([place]) Interventions    Readmission Risk Interventions No flowsheet data found.

## 2020-05-04 NOTE — Progress Notes (Signed)
Physical Therapy Treatment Patient Details Name: Elizabeth Bush MRN: [id number] DOB: [birth year] Today's Date: 05/04/2020    History of Present Illness Pt s/p L TKR    PT Comments    Pt continues cooperative but progress ltd by c//o pain/fatigue.   Follow Up Recommendations  Follow surgeon's recommendation for DC plan and follow-up therapies     Equipment Recommendations  Rolling walker with 5" wheels;3in1 (PT)    Recommendations for Other Services OT consult     Precautions / Restrictions Precautions Precautions: Knee;Fall Required Braces or Orthoses: Knee Immobilizer - Left Knee Immobilizer - Left: Discontinue once straight leg raise with < 10 degree lag Restrictions Weight Bearing Restrictions: No LLE Weight Bearing: Weight bearing as tolerated    Mobility  Bed Mobility Overal bed mobility: Needs Assistance Bed Mobility: Sit to Supine       Sit to supine: Min assist;Mod assist   General bed mobility comments: INcreased time with cues for sequence and use of R LE to self assist.  Physical assist to manage L LE  Transfers Overall transfer level: Needs assistance Equipment used: Rolling walker (2 wheeled) Transfers: Sit to/from Stand Sit to Stand: Mod assist;+2 physical assistance;+2 safety/equipment         General transfer comment: cues for LE management and use of UEs to self assist;  Physical assist to bring wt up and fwd and to balance in standing  Ambulation/Gait Ambulation/Gait assistance: Min assist;+2 safety/equipment Gait Distance (Feet): 6 Feet Assistive device: Rolling walker (2 wheeled) Gait Pattern/deviations: Step-to pattern;Decreased step length - right;Decreased step length - left;Trunk flexed;Shuffle Gait velocity: decr   General Gait Details: cues for sequence, posture, position from RW; distance ltd by pain/fatigue   Stairs             Wheelchair Mobility    Modified Rankin (Stroke Patients Only)       Balance Overall  balance assessment: Needs assistance Sitting-balance support: No upper extremity supported;Feet supported Sitting balance-Leahy Scale: Good     Standing balance support: Bilateral upper extremity supported Standing balance-Leahy Scale: Poor                              Cognition Arousal/Alertness: Awake/alert Behavior During Therapy: WFL for tasks assessed/performed Overall Cognitive Status: Within Functional Limits for tasks assessed                                        Exercises      General Comments        Pertinent Vitals/Pain Pain Assessment: 0-10 Pain Score: 8  Pain Location: L knee Pain Descriptors / Indicators: Aching;Sore Pain Intervention(s): Limited activity within patient's tolerance;Monitored during session;Ice applied    Home Living                      Prior Function            PT Goals (current goals can now be found in the care plan section) Acute Rehab PT Goals Patient Stated Goal: Regain IND PT Goal Formulation: With patient Time For Goal Achievement: 05/10/20 Potential to Achieve Goals: Good Progress towards PT goals: Not progressing toward goals - comment (Ltd by pain/fatigue)    Frequency    7X/week      PT Plan Current plan remains appropriate    Co-evaluation  AM-PAC PT "6 Clicks" Mobility   Outcome Measure  Help needed turning from your back to your side while in a flat bed without using bedrails?: A Lot Help needed moving from lying on your back to sitting on the side of a flat bed without using bedrails?: A Lot Help needed moving to and from a bed to a chair (including a wheelchair)?: A Lot Help needed standing up from a chair using your arms (e.g., wheelchair or bedside chair)?: A Lot Help needed to walk in hospital room?: A Little Help needed climbing 3-5 steps with a railing? : A Lot 6 Click Score: 13    End of Session Equipment Utilized During Treatment: Gait  belt;Left knee immobilizer Activity Tolerance: Patient limited by fatigue;Patient limited by pain Patient left: in bed;with call bell/phone within reach;with bed alarm set Nurse Communication: Mobility status PT Visit Diagnosis: Difficulty in walking, not elsewhere classified (R26.2)     Time: 6770-3403 PT Time Calculation (min) (ACUTE ONLY): 19 min  Charges:  $Gait Training: 8-22 mins                     [place] Pager [phone] Office [phone]    Charlet Harr 05/04/2020, 4:21 PM

## 2020-05-04 NOTE — Progress Notes (Signed)
Physical Therapy Treatment Patient Details Name: Elizabeth Bush MRN: [id number] DOB: December 08, 1970 Today's Date: 05/04/2020    History of Present Illness Pt s/p L TKR    PT Comments    Pt continues cooperative despite difficult night.  Pt up to ambulate increased distance in hall but continues to require significant assist for transfers.  Follow Up Recommendations  Follow surgeon's recommendation for DC plan and follow-up therapies     Equipment Recommendations  Rolling walker with 5" wheels;3in1 (PT)    Recommendations for Other Services OT consult     Precautions / Restrictions Precautions Precautions: Knee;Fall Required Braces or Orthoses: Knee Immobilizer - Left Knee Immobilizer - Left: Discontinue once straight leg raise with < 10 degree lag Restrictions Weight Bearing Restrictions: No LLE Weight Bearing: Weight bearing as tolerated    Mobility  Bed Mobility Overal bed mobility: Needs Assistance Bed Mobility: Supine to Sit     Supine to sit: Min assist;Mod assist     General bed mobility comments: INcreased time with cues for sequence and use of R LE to self assist.  Physical assist to manage L LE and to complete transition to EOB sitting  Transfers Overall transfer level: Needs assistance Equipment used: Rolling walker (2 wheeled) Transfers: Sit to/from Stand Sit to Stand: From elevated surface;Min assist;Mod assist;+2 physical assistance;+2 safety/equipment         General transfer comment: cues for LE management and use of UEs to self assist;  Physical assist to bring wt up and fwd and to balance in standing  Ambulation/Gait Ambulation/Gait assistance: Min assist;+2 safety/equipment Gait Distance (Feet): 44 Feet Assistive device: Rolling walker (2 wheeled) Gait Pattern/deviations: Step-to pattern;Decreased step length - right;Decreased step length - left;Trunk flexed;Shuffle Gait velocity: decr   General Gait Details: cues for sequence, posture,  position from [employer]             Wheelchair Mobility    Modified Rankin (Stroke Patients Only)       Balance Overall balance assessment: Needs assistance Sitting-balance support: No upper extremity supported;Feet supported Sitting balance-Leahy Scale: Good     Standing balance support: Bilateral upper extremity supported Standing balance-Leahy Scale: Poor                              Cognition Arousal/Alertness: Awake/alert Behavior During Therapy: WFL for tasks assessed/performed Overall Cognitive Status: Within Functional Limits for tasks assessed                                        Exercises Total Joint Exercises Ankle Circles/Pumps: AROM Quad Sets: AROM;Both;10 reps;Supine    General Comments        Pertinent Vitals/Pain Pain Assessment: 0-10 Pain Score: 6  Pain Location: L knee Pain Descriptors / Indicators: Aching;Sore Pain Intervention(s): Limited activity within patient's tolerance;Monitored during session;Premedicated before session;Ice applied    Home Living                      Prior Function            PT Goals (current goals can now be found in the care plan section) Acute Rehab PT Goals Patient Stated Goal: Regain IND PT Goal Formulation: With patient Time For Goal Achievement: 05/10/20 Potential to Achieve Goals: Good Progress towards PT goals: Progressing toward goals  Frequency    7X/week      PT Plan Current plan remains appropriate    Co-evaluation              AM-PAC PT "6 Clicks" Mobility   Outcome Measure  Help needed turning from your back to your side while in a flat bed without using bedrails?: A Lot Help needed moving from lying on your back to sitting on the side of a flat bed without using bedrails?: A Lot Help needed moving to and from a bed to a chair (including a wheelchair)?: A Lot Help needed standing up from a chair using your arms (e.g., wheelchair  or bedside chair)?: A Lot Help needed to walk in hospital room?: A Little Help needed climbing 3-5 steps with a railing? : A Lot 6 Click Score: 13    End of Session Equipment Utilized During Treatment: Gait belt;Left knee immobilizer Activity Tolerance: Patient limited by fatigue Patient left: in chair;with call bell/phone within reach;with chair alarm set;with family/visitor present Nurse Communication: Mobility status PT Visit Diagnosis: Difficulty in walking, not elsewhere classified (R26.2)     Time: 1030-1314 PT Time Calculation (min) (ACUTE ONLY): 27 min  Charges:  $Gait Training: 8-22 mins $Therapeutic Activity: 8-22 mins                     Debe Coder PT Acute Rehabilitation Services Pager [phone] Office [phone]    Asli Tokarski 05/04/2020, 12:33 PM

## 2020-05-04 NOTE — Progress Notes (Signed)
Subjective: 1 Day Post-Op Procedure(s) (LRB): LEFT TOTAL KNEE ARTHROPLASTY (Left) Patient reports pain as moderate.  Acute on chronic blood loss anemia.  Actually had a transfusion earlier this month.  Working slowly with therapy.  Objective: Vital signs in last 24 hours: Temp:  [97.3 F (36.3 C)-98.3 F (36.8 C)] 97.9 F (36.6 C) (12/18 0955) Pulse Rate:  [83-150] 99 (12/18 0955) Resp:  [16-18] 16 (12/18 0955) BP: (104-165)/(74-116) 118/74 (12/18 0955) SpO2:  [93 %-100 %] 98 % (12/18 0955)  Intake/Output from previous day: 12/17 0701 - 12/18 0700 In: 3361.8 [P.O.:237; I.V.:2824.8; IV Piggyback:300] Out: 2482 [Urine:1650; Blood:25] Intake/Output this shift: Total I/O In: 240 [P.O.:240] Out: -   Recent Labs    05/04/20 0251  HGB 8.8*   Recent Labs    05/04/20 0251  WBC 15.2*  RBC 3.08*  HCT 28.3*  PLT 226   Recent Labs    05/04/20 0251  NA 133*  K 2.9*  CL 100  CO2 21*  BUN 8  CREATININE 0.78  GLUCOSE 132*  CALCIUM 8.5*   No results for input(s): LABPT, INR in the last 72 hours.  Sensation intact distally Intact pulses distally Dorsiflexion/Plantar flexion intact Incision: scant drainage No cellulitis present Compartment soft   Assessment/Plan: 1 Day Post-Op Procedure(s) (LRB): LEFT TOTAL KNEE ARTHROPLASTY (Left) Up with therapy Discharge home with home health next 1-2 days depending on progress with therapy. Will change to Inpatient Admission. Check CBC tomorrow.      [name] 05/04/2020, 11:17 AM

## 2020-05-05 LAB — CBC
HCT: 25.2 % — ABNORMAL LOW (ref 36.0–46.0)
Hemoglobin: 8 g/dL — ABNORMAL LOW (ref 12.0–15.0)
MCH: 28.6 pg (ref 26.0–34.0)
MCHC: 31.7 g/dL (ref 30.0–36.0)
MCV: 90 fL (ref 80.0–100.0)
Platelets: 218 10*3/uL (ref 150–400)
RBC: 2.8 MIL/uL — ABNORMAL LOW (ref 3.87–5.11)
RDW: 23.3 % — ABNORMAL HIGH (ref 11.5–15.5)
WBC: 15.6 10*3/uL — ABNORMAL HIGH (ref 4.0–10.5)
nRBC: 0 % (ref 0.0–0.2)

## 2020-05-05 MED ORDER — POTASSIUM CHLORIDE CRYS ER 20 MEQ PO TBCR
40.0000 meq | EXTENDED_RELEASE_TABLET | Freq: Two times a day (BID) | ORAL | Status: DC
  Administered 2020-05-05: 40 meq via ORAL
  Filled 2020-05-05: qty 2

## 2020-05-05 NOTE — Progress Notes (Addendum)
   Subjective: 2 Days Post-Op Procedure(s) (LRB): LEFT TOTAL KNEE ARTHROPLASTY (Left) Patient reports pain as moderate and severe.    Objective: Vital signs in last 24 hours: Temp:  [98.5 F (36.9 C)-98.6 F (37 C)] 98.5 F (36.9 C) (12/19 0440) Pulse Rate:  [90-104] 95 (12/19 0440) Resp:  [17-18] 17 (12/19 0440) BP: (95-116)/(65-80) 104/75 (12/19 0440) SpO2:  [95 %-100 %] 99 % (12/19 0440)  Intake/Output from previous day: 12/18 0701 - 12/19 0700 In: 2152.6 [P.O.:480; I.V.:1672.6] Out: 650 [Urine:650] Intake/Output this shift: No intake/output data recorded.  Recent Labs    05/04/20 0251 05/05/20 0248  HGB 8.8* 8.0*   Recent Labs    05/04/20 0251 05/05/20 0248  WBC 15.2* 15.6*  RBC 3.08* 2.80*  HCT 28.3* 25.2*  PLT 226 218   Recent Labs    05/04/20 0251  NA 133*  K 2.9*  CL 100  CO2 21*  BUN 8  CREATININE 0.78  GLUCOSE 132*  CALCIUM 8.5*   No results for input(s): LABPT, INR in the last 72 hours.  Neurologically intact No results found.  Assessment/Plan: 2 Days Post-Op Procedure(s) (LRB): LEFT TOTAL KNEE ARTHROPLASTY (Left) Up with therapy, wants to go home but fatigues with PT and moving slowly short distances. If does well with PT can be discharged. If does poorly or fall risk will have to stay another day.   Marybelle Killings 05/05/2020, 9:56 AM

## 2020-05-05 NOTE — TOC Progression Note (Signed)
Transition of Care [facility]) - Progression Note    Patient Details  Name: Elizabeth Bush MRN: [id number] Date of Birth: [birth year]  [facility] CM/SW Contact  Joaquin Courts, RN Phone Number: 05/05/2020, 11:27 AM  Clinical Narrative:    Patient requests [place] aide services.  CM spoke with [place] and notified patient will need [place] aide services.     Expected Discharge Plan: [place] Barriers to Discharge: No Barriers Identified  Expected Discharge Plan and Services Expected Discharge Plan: [place]   Discharge Planning Services: CM Consult Post Acute Care Choice: [place] arrangements for the past 2 months: Single Family Home Expected Discharge Date: 05/05/20               DME Arranged: 3-N-1 DME Agency: [employer] Date DME Agency Contacted: 05/04/20 Time DME Agency Contacted: [phone] Representative spoke with at DME Agency: [facility] HH Arranged: Nurse's Aide [place] Agency: [facility] [employer] Date [place]: 05/05/20 Time [place]: 1127 Representative spoke with at [place]: scarlett   Social Determinants of Health ([place]) Interventions    Readmission Risk Interventions No flowsheet data found.

## 2020-05-05 NOTE — Progress Notes (Signed)
Physical Therapy Treatment Patient Details Name: Elizabeth Bush MRN: [id number] DOB: [birth year] Today's Date: 05/05/2020    History of Present Illness Pt s/p L TKR    PT Comments    Pt with marked improvement on all levels vs yesterday.  Pt up to ambulate increased distance in hall and requiring far less assist for all activities.  Pt states she is arranging for a bed to be moved to main level of her town home so she will not have to negotiate any stairs to enter or live in home.  Pt very eager for dc home and confirms she has 24/7 assist of friends and family.   Follow Up Recommendations  Follow surgeon's recommendation for DC plan and follow-up therapies     Equipment Recommendations  Rolling walker with 5" wheels;3in1 (PT)    Recommendations for Other Services OT consult     Precautions / Restrictions Precautions Precautions: Knee;Fall Required Braces or Orthoses: Knee Immobilizer - Left Knee Immobilizer - Left: Discontinue once straight leg raise with < 10 degree lag Restrictions Weight Bearing Restrictions: No LLE Weight Bearing: Weight bearing as tolerated    Mobility  Bed Mobility Overal bed mobility: Needs Assistance Bed Mobility: Sit to Supine       Sit to supine: Min assist   General bed mobility comments: INcreased time with cues for sequence and use of R LE to self assist.  Physical assist to manage L LE  Transfers Overall transfer level: Needs assistance Equipment used: Rolling walker (2 wheeled) Transfers: Sit to/from Stand Sit to Stand: Min assist         General transfer comment: cues for LE management and use of UEs to self assist;  Physical assist to bring wt up and fwd and to balance in standing  Ambulation/Gait Ambulation/Gait assistance: Min guard;Supervision Gait Distance (Feet): 95 Feet Assistive device: Rolling walker (2 wheeled) Gait Pattern/deviations: Step-to pattern;Decreased step length - right;Decreased step length - left;Trunk  flexed;Shuffle Gait velocity: decr   General Gait Details: cues for sequence, posture, position from RW; distance ltd by pain/fatigue   Stairs             Wheelchair Mobility    Modified Rankin (Stroke Patients Only)       Balance Overall balance assessment: Needs assistance Sitting-balance support: No upper extremity supported;Feet supported Sitting balance-Leahy Scale: Good     Standing balance support: No upper extremity supported Standing balance-Leahy Scale: Fair                              Cognition Arousal/Alertness: Awake/alert Behavior During Therapy: WFL for tasks assessed/performed Overall Cognitive Status: Within Functional Limits for tasks assessed                                        Exercises Total Joint Exercises Ankle Circles/Pumps: AROM;Both;15 reps;Supine Quad Sets: AROM;Both;10 reps;Supine Heel Slides: AAROM;Left;15 reps;Supine Straight Leg Raises: AAROM;Left;10 reps;Supine Goniometric ROM: AARM L knee -5 - 50    General Comments        Pertinent Vitals/Pain Pain Assessment: 0-10 Pain Score: 7  Pain Location: L knee Pain Descriptors / Indicators: Aching;Sore Pain Intervention(s): Limited activity within patient's tolerance;Monitored during session;Premedicated before session;Ice applied    Home Living  Prior Function            PT Goals (current goals can now be found in the care plan section) Acute Rehab PT Goals Patient Stated Goal: Regain IND PT Goal Formulation: With patient Time For Goal Achievement: 05/10/20 Potential to Achieve Goals: Good Progress towards PT goals: Progressing toward goals    Frequency    7X/week      PT Plan Current plan remains appropriate    Co-evaluation              AM-PAC PT "6 Clicks" Mobility   Outcome Measure  Help needed turning from your back to your side while in a flat bed without using bedrails?: A Lot Help  needed moving from lying on your back to sitting on the side of a flat bed without using bedrails?: A Little Help needed moving to and from a bed to a chair (including a wheelchair)?: A Little Help needed standing up from a chair using your arms (e.g., wheelchair or bedside chair)?: A Little Help needed to walk in hospital room?: A Little Help needed climbing 3-5 steps with a railing? : A Lot 6 Click Score: 16    End of Session Equipment Utilized During Treatment: Gait belt;Left knee immobilizer Activity Tolerance: Patient tolerated treatment well;Patient limited by fatigue;Patient limited by pain Patient left: in bed;with call bell/phone within reach;with bed alarm set;with family/visitor present Nurse Communication: Mobility status PT Visit Diagnosis: Difficulty in walking, not elsewhere classified (R26.2)     Time: 1638-4665 PT Time Calculation (min) (ACUTE ONLY): 40 min  Charges:  $Gait Training: 8-22 mins $Therapeutic Exercise: 8-22 mins $Therapeutic Activity: 8-22 mins                     [place] Pager [phone] Office [phone]    Rika Daughdrill 05/05/2020, 1:57 PM

## 2020-05-06 ENCOUNTER — Telehealth: Payer: Self-pay

## 2020-05-06 ENCOUNTER — Encounter (HOSPITAL_COMMUNITY): Payer: Self-pay | Admitting: Orthopaedic Surgery

## 2020-05-06 ENCOUNTER — Telehealth: Payer: Self-pay | Admitting: *Deleted

## 2020-05-06 DIAGNOSIS — Z96652 Presence of left artificial knee joint: Secondary | ICD-10-CM | POA: Diagnosis not present

## 2020-05-06 DIAGNOSIS — M1712 Unilateral primary osteoarthritis, left knee: Secondary | ICD-10-CM | POA: Diagnosis not present

## 2020-05-06 NOTE — Telephone Encounter (Signed)
RNCM in office requested to assist with ordering of a FWW for patient. Call to patient and she confirmed she does need a FWW. Requested a hospital bed. Informed that most insurances will not cover this DME for a total joint replacement. Rental of hospital bed through [employer] typically $100 per month. Patient states she will call back if needed. Ordered FWW through [employer] to be delivered to her home.

## 2020-05-06 NOTE — Telephone Encounter (Signed)
Patient was sent home without a walker.  Needs walker delivered as soon as possible.  Thanks!

## 2020-05-06 NOTE — Telephone Encounter (Signed)
Transition Care Management Follow-up Telephone Call  Date of discharge and from where: 05/03/2020, [facility]   How have you been since you were released from the hospital? She said she needs help at home. She has not heard from the home health agency - [facility].  Explained to her that the services they provide are very limited and authorization is needed from her insurance company. If she is going to outpatient PT , she may not be eligible for home health services.instructed her to call [facility] to inquire about the start of her care. Informed her that the phone number for [facility] is on page 1 of her AVS.   Any questions or concerns? Yes - noted above.   Items Reviewed:  Did the pt receive and understand the discharge instructions provided? Yes   Medications obtained and verified? she said she has all medications expcept the oxycodone.  explained to her that the order was sent to her [place]. She said that she already picked up medications from there and this was not one of them.  Instructed her to call the pharmacy again to check on the order and she said she would call.   Other? No   Any new allergies since your discharge? No   Do you have support at home? she said she lives alone but people come in and out and check on her  Home Care and Equipment/Supplies: Were home health services ordered?  yes If so, what is the name of the agency? [facility] up a time to come to the patient's home?  No  Were any new equipment or medical supplies ordered?  Yes: wheelchair.  it was being delivered when this CM was on the phone with the patient.  she said that the 3:1 commode was delivered to her in the hospital. What is the name of the medical supply agency? [employer] Were you able to get the supplies/equipment? yes Do you have any questions related to the use of the equipment or supplies? No  Functional Questionnaire: (I =  Independent and D = Dependent) ADLs: needs some assistance. Has been relying on other who come to see her to assist.   Follow up appointments reviewed:   PCP Hospital f/u appt confirmed? she said she will call the clinic to schedule an appointment.  [facility] f/u appt confirmed? Yes  - Dr Ninfa Linden - 05/16/2020.   Are transportation arrangements needed? No   If their condition worsens, is the pt aware to call PCP or go to the Emergency Dept.? yes  Was the patient provided with contact information for the PCP's office or ED?she has the clinic phone number  Was to pt encouraged to call back with questions or concerns?yes

## 2020-05-06 NOTE — Discharge Summary (Signed)
Patient ID: Elizabeth Bush MRN: [id number] DOB/AGE: [birth year] 49 y.o.  Admit date: 05/03/2020 Discharge date: 05/06/2020  Admission Diagnoses:  Principal Problem:   Unilateral primary osteoarthritis, left knee Active Problems:   Status post total left knee replacement   Discharge Diagnoses:  Same  Past Medical History:  Diagnosis Date  . Anemia   . Anxiety   . Arthritis   . Chlamydia   . Depression   . Dyspnea    when walkking   . GERD (gastroesophageal reflux disease)   . Gonorrhea   . Hypertension     Surgeries: Procedure(s): LEFT TOTAL KNEE ARTHROPLASTY on 05/03/2020   Consultants:   Discharged Condition: Improved  Hospital Course: Elizabeth Bush is an 49 y.o. female who was admitted 05/03/2020 for operative treatment ofUnilateral primary osteoarthritis, left knee. Patient has severe unremitting pain that affects sleep, daily activities, and work/hobbies. After pre-op clearance the patient was taken to the operating room on 05/03/2020 and underwent  Procedure(s): LEFT TOTAL KNEE ARTHROPLASTY.    Patient was given perioperative antibiotics:  Anti-infectives (From admission, onward)   Start     Dose/Rate Route Frequency Ordered Stop   05/03/20 1500  ceFAZolin (ANCEF) IVPB 1 g/50 mL premix        1 g 100 mL/hr over 30 Minutes Intravenous Every 6 hours 05/03/20 1151 05/03/20 2119   05/03/20 1400  metroNIDAZOLE (FLAGYL) tablet 250 mg  Status:  Discontinued        250 mg Oral 4 times daily 05/03/20 1151 05/03/20 1203   05/03/20 1245  doxycycline (VIBRA-TABS) tablet 100 mg  Status:  Discontinued        100 mg Oral 2 times daily 05/03/20 1151 05/03/20 1203   05/03/20 0600  ceFAZolin (ANCEF) IVPB 2g/100 mL premix        2 g 200 mL/hr over 30 Minutes Intravenous On call to O.R. 05/03/20 6767 05/03/20 0848       Patient was given sequential compression devices, early ambulation, and chemoprophylaxis to prevent DVT.  Patient benefited maximally from hospital stay and  there were no complications.    Recent vital signs: No data found.   Recent laboratory studies:  Recent Labs    05/04/20 0251 05/05/20 0248  WBC 15.2* 15.6*  HGB 8.8* 8.0*  HCT 28.3* 25.2*  PLT 226 218  NA 133*  --   K 2.9*  --   CL 100  --   CO2 21*  --   BUN 8  --   CREATININE 0.78  --   GLUCOSE 132*  --   CALCIUM 8.5*  --      Discharge Medications:   Allergies as of 05/05/2020      Reactions   Dilaudid [hydromorphone] Anxiety, Other (See Comments)   Patient gets paranoid and has temporary delirium    Lisinopril Swelling   Swelling of mouth/lips      Medication List    TAKE these medications   amLODipine 10 MG tablet Commonly known as: NORVASC Take 1 tablet (10 mg total) by mouth daily.   aspirin 81 MG chewable tablet Chew 1 tablet (81 mg total) by mouth 2 (two) times daily.   celecoxib 200 MG capsule Commonly known as: CeleBREX Take 1 capsule (200 mg total) by mouth daily.   doxycycline 100 MG tablet Commonly known as: VIBRA-TABS Take 1 tablet (100 mg total) by mouth 2 (two) times daily for 14 days.   methocarbamol 500 MG tablet Commonly known as: ROBAXIN Take 1  tablet (500 mg total) by mouth every 6 (six) hours as needed for muscle spasms.   metroNIDAZOLE 250 MG tablet Commonly known as: FLAGYL Take 1 tablet (250 mg total) by mouth 4 (four) times daily for 14 days.   multivitamin with minerals tablet Take 1 tablet by mouth daily.   omeprazole 20 MG capsule Commonly known as: PRILOSEC Take 1 capsule (20 mg total) by mouth in the morning and at bedtime for 14 days.   oxyCODONE 5 MG immediate release tablet Commonly known as: Oxy IR/ROXICODONE Take 1-2 tablets (5-10 mg total) by mouth every 4 (four) hours as needed for moderate pain (pain score 4-6).       Diagnostic Studies: DG Knee Left Port  Result Date: 05/03/2020 CLINICAL DATA:  Left total knee arthroplasty EXAM: PORTABLE LEFT KNEE - 1-2 VIEW COMPARISON:  None. FINDINGS: The left knee  demonstrates a total knee arthroplasty without evidence of hardware failure complication. There is no significant joint effusion. There is no fracture or dislocation. The alignment is anatomic. Post-surgical changes noted in the surrounding soft tissues. IMPRESSION: Interval left total knee arthroplasty. Electronically Signed   By: Kathreen Devoid   On: 05/03/2020 11:43    Disposition: Discharge disposition: 01-Home or [place]    Elizabeth Rossetti, MD Follow up in 2 week(s).   Specialty: Orthopedic Surgery Contact information: [place] [address] [phone]        [facility], [facility].   Specialty: [place] Why: agency will provide home health services Contact information: [place] [address] [phone]                Signed: Erskine Emery 05/06/2020, 11:41 AM

## 2020-05-06 NOTE — Telephone Encounter (Signed)
Transition Care Management Follow-up Telephone Call  Date of discharge and from where: 05/05/20 - [facility]  How have you been since you were released from the hospital? "I am not doing well. I need all the help I can get."  Any questions or concerns? No  Items Reviewed:  Did the pt receive and understand the discharge instructions provided? Yes   Medications obtained and verified? Yes   Other? No   Any new allergies since your discharge? No   Dietary orders reviewed? Yes  Do you have support at home? Yes   Home Care and Equipment/Supplies: Were home health services ordered? yes If so, what is the name of the agency? Pt does not know  Has the agency set up a time to come to the patient's home? no Were any new equipment or medical supplies ordered?  Yes: Wheelchair, [employer] chair per pt What is the name of the medical supply agency? Pt does not know Were you able to get the supplies/equipment? no Do you have any questions related to the use of the equipment or supplies? Yes: wants to know when she will recieve the supplies, advised her to call her Orthopedist to follow up on these orders.  Functional Questionnaire: (I = Independent and D = Dependent) ADLs: D  Bathing/Dressing- D  Meal Prep- D  Eating- I  Maintaining continence- D  Transferring/Ambulation- D  Managing Meds- I  Follow up appointments reviewed:   PCP Hospital f/u appt confirmed? No    Specialist Hospital f/u appt confirmed? Yes  Scheduled to see Ortho on 05/16/20 @ 1330.  Are transportation arrangements needed? No   If their condition worsens, is the pt aware to call PCP or go to the Emergency Dept.? Yes  Was the patient provided with contact information for the PCP's office or ED? Yes  Was to pt encouraged to call back with questions or concerns? Yes

## 2020-05-06 NOTE — Telephone Encounter (Signed)
[facility] is taking care of this for patient

## 2020-05-07 ENCOUNTER — Other Ambulatory Visit: Payer: Self-pay | Admitting: *Deleted

## 2020-05-07 DIAGNOSIS — Z96652 Presence of left artificial knee joint: Secondary | ICD-10-CM

## 2020-05-09 ENCOUNTER — Ambulatory Visit: Payer: Medicaid Other | Admitting: Physical Therapy

## 2020-05-14 ENCOUNTER — Telehealth: Payer: Self-pay

## 2020-05-14 NOTE — Telephone Encounter (Signed)
Patient is coming in Monday for post op.  She only has 2 pain pills left.  Can you Rx more pain medication?  She is very concerned about having staples removed.  She uses [facility].

## 2020-05-15 ENCOUNTER — Other Ambulatory Visit: Payer: Self-pay | Admitting: Orthopaedic Surgery

## 2020-05-15 MED ORDER — OXYCODONE HCL 5 MG PO TABS: 5.0000 mg | ORAL_TABLET | ORAL | 0 refills | Status: DC | PRN

## 2020-05-16 ENCOUNTER — Inpatient Hospital Stay: Payer: Medicaid Other | Admitting: Orthopaedic Surgery

## 2020-05-16 DIAGNOSIS — D509 Iron deficiency anemia, unspecified: Secondary | ICD-10-CM | POA: Diagnosis not present

## 2020-05-16 DIAGNOSIS — Z471 Aftercare following joint replacement surgery: Secondary | ICD-10-CM | POA: Diagnosis not present

## 2020-05-16 DIAGNOSIS — F419 Anxiety disorder, unspecified: Secondary | ICD-10-CM | POA: Diagnosis not present

## 2020-05-16 DIAGNOSIS — Z96652 Presence of left artificial knee joint: Secondary | ICD-10-CM | POA: Diagnosis not present

## 2020-05-16 DIAGNOSIS — F17210 Nicotine dependence, cigarettes, uncomplicated: Secondary | ICD-10-CM | POA: Diagnosis not present

## 2020-05-16 DIAGNOSIS — I10 Essential (primary) hypertension: Secondary | ICD-10-CM | POA: Diagnosis not present

## 2020-05-16 DIAGNOSIS — K219 Gastro-esophageal reflux disease without esophagitis: Secondary | ICD-10-CM | POA: Diagnosis not present

## 2020-05-16 DIAGNOSIS — F331 Major depressive disorder, recurrent, moderate: Secondary | ICD-10-CM | POA: Diagnosis not present

## 2020-05-16 DIAGNOSIS — M1990 Unspecified osteoarthritis, unspecified site: Secondary | ICD-10-CM | POA: Diagnosis not present

## 2020-05-16 DIAGNOSIS — Z7982 Long term (current) use of aspirin: Secondary | ICD-10-CM | POA: Diagnosis not present

## 2020-05-18 DIAGNOSIS — Z419 Encounter for procedure for purposes other than remedying health state, unspecified: Secondary | ICD-10-CM | POA: Diagnosis not present

## 2020-05-20 ENCOUNTER — Inpatient Hospital Stay: Payer: Medicaid Other | Admitting: Physician Assistant

## 2020-05-21 ENCOUNTER — Inpatient Hospital Stay: Payer: Medicaid Other | Admitting: Orthopaedic Surgery

## 2020-05-21 DIAGNOSIS — K219 Gastro-esophageal reflux disease without esophagitis: Secondary | ICD-10-CM | POA: Diagnosis not present

## 2020-05-21 DIAGNOSIS — D509 Iron deficiency anemia, unspecified: Secondary | ICD-10-CM | POA: Diagnosis not present

## 2020-05-21 DIAGNOSIS — F17210 Nicotine dependence, cigarettes, uncomplicated: Secondary | ICD-10-CM | POA: Diagnosis not present

## 2020-05-21 DIAGNOSIS — I10 Essential (primary) hypertension: Secondary | ICD-10-CM | POA: Diagnosis not present

## 2020-05-21 DIAGNOSIS — Z471 Aftercare following joint replacement surgery: Secondary | ICD-10-CM | POA: Diagnosis not present

## 2020-05-21 DIAGNOSIS — F419 Anxiety disorder, unspecified: Secondary | ICD-10-CM | POA: Diagnosis not present

## 2020-05-21 DIAGNOSIS — M1990 Unspecified osteoarthritis, unspecified site: Secondary | ICD-10-CM | POA: Diagnosis not present

## 2020-05-21 DIAGNOSIS — F331 Major depressive disorder, recurrent, moderate: Secondary | ICD-10-CM | POA: Diagnosis not present

## 2020-05-21 DIAGNOSIS — Z7982 Long term (current) use of aspirin: Secondary | ICD-10-CM | POA: Diagnosis not present

## 2020-05-21 DIAGNOSIS — Z96652 Presence of left artificial knee joint: Secondary | ICD-10-CM | POA: Diagnosis not present

## 2020-05-22 ENCOUNTER — Ambulatory Visit (INDEPENDENT_AMBULATORY_CARE_PROVIDER_SITE_OTHER): Payer: Medicaid Other | Admitting: Orthopaedic Surgery

## 2020-05-22 ENCOUNTER — Encounter: Payer: Self-pay | Admitting: Orthopaedic Surgery

## 2020-05-22 DIAGNOSIS — Z96652 Presence of left artificial knee joint: Secondary | ICD-10-CM

## 2020-05-22 MED ORDER — OXYCODONE HCL 5 MG PO TABS: 5.0000 mg | ORAL_TABLET | ORAL | 0 refills | Status: DC | PRN

## 2020-05-22 NOTE — Progress Notes (Signed)
The patient is a 50 year old female who is now 2 weeks status post a left total knee arthroplasty.  [facility]  We discharged her we want her to take baby aspirin twice daily.  She has been on oxycodone as well for pain.  She was able to afford to get her oxycodone filled but said she could not afford to pay for aspirin.  She has been going through home health therapy.  On exam the staples have been removed from her left knee and Steri-Strips applied.  Fortunately her calf is soft and there is no swelling her foot and ankle.  She has almost full extension to at least 90 degrees flexion.  I told her the importance of the aspirin and that she is still a blood clot risk.  She still wants me to refill her oxycodone which will.  We need to set her up for outpatient physical therapy at [facility] outpatient therapy on [facility] for continued motion and stability and strengthening of her left total knee arthroplasty.  I will see her back in about 4 weeks to see how she is doing overall.  I did send in some oxycodone for her because I do feel that she needs that for continued pain control for therapy purposes.  I encouraged her again to take aspirin.

## 2020-05-23 ENCOUNTER — Other Ambulatory Visit: Payer: Self-pay

## 2020-05-23 ENCOUNTER — Ambulatory Visit: Payer: Medicaid Other | Admitting: Physical Therapy

## 2020-05-23 DIAGNOSIS — Z96652 Presence of left artificial knee joint: Secondary | ICD-10-CM

## 2020-05-24 DIAGNOSIS — F331 Major depressive disorder, recurrent, moderate: Secondary | ICD-10-CM | POA: Diagnosis not present

## 2020-05-24 DIAGNOSIS — F17210 Nicotine dependence, cigarettes, uncomplicated: Secondary | ICD-10-CM | POA: Diagnosis not present

## 2020-05-24 DIAGNOSIS — I10 Essential (primary) hypertension: Secondary | ICD-10-CM | POA: Diagnosis not present

## 2020-05-24 DIAGNOSIS — Z96652 Presence of left artificial knee joint: Secondary | ICD-10-CM | POA: Diagnosis not present

## 2020-05-24 DIAGNOSIS — R32 Unspecified urinary incontinence: Secondary | ICD-10-CM | POA: Diagnosis not present

## 2020-05-24 DIAGNOSIS — F419 Anxiety disorder, unspecified: Secondary | ICD-10-CM | POA: Diagnosis not present

## 2020-05-24 DIAGNOSIS — Z471 Aftercare following joint replacement surgery: Secondary | ICD-10-CM | POA: Diagnosis not present

## 2020-05-24 DIAGNOSIS — D509 Iron deficiency anemia, unspecified: Secondary | ICD-10-CM | POA: Diagnosis not present

## 2020-05-24 DIAGNOSIS — K219 Gastro-esophageal reflux disease without esophagitis: Secondary | ICD-10-CM | POA: Diagnosis not present

## 2020-05-24 DIAGNOSIS — Z7982 Long term (current) use of aspirin: Secondary | ICD-10-CM | POA: Diagnosis not present

## 2020-05-24 DIAGNOSIS — M1990 Unspecified osteoarthritis, unspecified site: Secondary | ICD-10-CM | POA: Diagnosis not present

## 2020-05-28 ENCOUNTER — Encounter: Payer: Self-pay | Admitting: Physical Therapy

## 2020-05-28 ENCOUNTER — Ambulatory Visit: Payer: Medicaid Other | Attending: Orthopaedic Surgery | Admitting: Physical Therapy

## 2020-05-28 ENCOUNTER — Other Ambulatory Visit: Payer: Self-pay

## 2020-05-28 DIAGNOSIS — R278 Other lack of coordination: Secondary | ICD-10-CM | POA: Diagnosis not present

## 2020-05-28 DIAGNOSIS — M6281 Muscle weakness (generalized): Secondary | ICD-10-CM | POA: Diagnosis not present

## 2020-05-28 DIAGNOSIS — M25662 Stiffness of left knee, not elsewhere classified: Secondary | ICD-10-CM | POA: Diagnosis not present

## 2020-05-28 DIAGNOSIS — R601 Generalized edema: Secondary | ICD-10-CM | POA: Insufficient documentation

## 2020-05-28 DIAGNOSIS — R262 Difficulty in walking, not elsewhere classified: Secondary | ICD-10-CM | POA: Insufficient documentation

## 2020-05-28 DIAGNOSIS — M25562 Pain in left knee: Secondary | ICD-10-CM | POA: Insufficient documentation

## 2020-05-28 DIAGNOSIS — G8929 Other chronic pain: Secondary | ICD-10-CM | POA: Insufficient documentation

## 2020-05-28 NOTE — Therapy (Signed)
[facility] [address] [place], [place], [address] Phone: [phone]   Fax:  [phone]  Physical Therapy Evaluation  Patient Details  Name: Elizabeth Bush MRN: [id number] Date of Birth: 05-30-1970 Referring Provider (PT): [name], MD   Encounter Date: 05/28/2020   PT End of Session - 05/28/20 1704    Visit Number 1    Date for PT Re-Evaluation 08/20/20    Authorization Type [employer]    PT Start Time 2542    PT Stop Time 1615    PT Time Calculation (min) 45 min    Activity Tolerance Patient tolerated treatment well;Patient limited by pain    Behavior During Therapy [facility] for tasks assessed/performed           Past Medical History:  Diagnosis Date  . Anemia   . Anxiety   . Arthritis   . Chlamydia   . Depression   . Dyspnea    when walkking   . GERD (gastroesophageal reflux disease)   . Gonorrhea   . Hypertension     Past Surgical History:  Procedure Laterality Date  . COLONOSCOPY  05/31/2011   Procedure: COLONOSCOPY;  Surgeon: Landry Dyke, MD;  Location: [facility];  Service: Endoscopy;  Laterality: N/A;  . ESOPHAGOGASTRODUODENOSCOPY  05/30/2011   Procedure: ESOPHAGOGASTRODUODENOSCOPY (EGD);  Surgeon: Landry Dyke, MD;  Location: [facility];  Service: Endoscopy;  Laterality: N/A;  . GIVENS CAPSULE STUDY  06/01/2011   Procedure: GIVENS CAPSULE STUDY;  Surgeon: Landry Dyke, MD;  Location: [facility];  Service: Endoscopy;  Laterality: N/A;  . TONSILLECTOMY    . TOTAL KNEE ARTHROPLASTY Left 05/03/2020   Procedure: LEFT TOTAL KNEE ARTHROPLASTY;  Surgeon: [name], MD;  Location: [facility];  Service: Orthopedics;  Laterality: Left;  . TUBAL LIGATION      There were no vitals filed for this visit.    Subjective Assessment - 05/28/20 1538    Subjective Pt states she is sore from going up and down the steps to get to the bath    Limitations Walking;Standing     Currently in Pain? Yes    Pain Score 8     Pain Location Knee    Pain Orientation Left    Pain Descriptors / Indicators Sore;Throbbing    Pain Type Surgical pain    Pain Onset More than a month ago    Pain Frequency Intermittent    Aggravating Factors  steps, standing and walking    Pain Relieving Factors sitting or pain medicine    Multiple Pain Sites No              [facility] PT Assessment - 05/28/20 0001      Assessment   Medical Diagnosis Z96.652 (ICD-10-CM) - Presence of left artificial knee joint    Referring Provider (PT) [name], MD    Onset Date/Surgical Date --   05/03/20   Prior Therapy [facility]      Precautions   Precautions Knee      Balance Screen   Has the patient fallen in the past 6 months No      [place] residence    Living Arrangements Alone      Prior Function   Level of Independence Independent    Vocation Unemployed      Cognition   Overall Cognitive Status Within Functional Limits for tasks assessed      Observation/Other Assessments  Observations steri strips still coming off; incision looks like it is healing well and no oozing or redness of note      Observation/Other Assessments-Edema    Edema Circumferential      Circumferential Edema   Circumferential - Right 43 cm   around medial joint line   Circumferential - Left  46 cm      Posture/Postural Control   Posture/Postural Control Postural limitations    Postural Limitations Flexed trunk      ROM / Strength   AROM / PROM / Strength AROM;Strength      AROM   Overall AROM Comments Lt knee -6; Lt hip -20; 70 deg flex    AROM Assessment Site Knee    Right/Left Knee Right;Left    Right Knee Extension 0    Left Knee Extension -6    Left Knee Flexion 86   after several attempts     Strength   Overall Strength Comments Lt LE 3+/5 throughout and pain with almost every MMT; Rt LE 4/5 knee flex/ext    Strength Assessment Site Knee     Right/Left Knee Right;Left    Right Knee Flexion 4/5    Right Knee Extension 4/5    Left Knee Flexion 3+/5    Left Knee Extension 3+/5      Ambulation/Gait   Ambulation/Gait Assistance 6: Modified independent (Device/Increase time)    Ambulation Distance (Feet) 50 Feet    Assistive device Rolling walker    Gait Pattern Step-to pattern;Decreased stance time - left;Trunk flexed                      Objective measurements completed on examination: See above findings.       [facility] Adult PT Treatment/Exercise - 05/28/20 0001      Self-Care   Other Self-Care Comments  edu on standing up straight and weight shift into the Lt LE      Modalities   Modalities Vasopneumatic      Vasopneumatic   Number Minutes Vasopneumatic  10 minutes    Vasopnuematic Location  Knee    Vasopneumatic Pressure Medium    Vasopneumatic Temperature  34                    PT Short Term Goals - 05/28/20 1615      PT SHORT TERM GOAL #1   Title independent with initial HEP    Time 4    Period Weeks    Status New      PT SHORT TERM GOAL #2   Title able to demonstrate knee strength of 4/5 Lt knee for improved gait    Time 4    Period Weeks    Status New             PT Long Term Goals - 05/28/20 1613      PT LONG TERM GOAL #1   Title independent with advanced HEP    Time 12    Period Weeks    Status New    Target Date 08/20/20      PT LONG TERM GOAL #2   Title knee flexion 120 for ability to negotiate stairs    Baseline 86 deg    Time 12    Period Weeks    Status New    Target Date 08/20/20      PT LONG TERM GOAL #3   Title LEFS improved by at least 9 points for statistically significant  funcitonal improvement    Time 12    Period Weeks    Status New    Target Date 08/20/20      PT LONG TERM GOAL #4   Title pt able to go up and down stairs at home to get to the bathroom with only mild pain at most    Baseline reports severe pain 9/10    Time 12     Period Weeks    Status New    Target Date 08/20/20                  Plan - 05/28/20 1617    Clinical Impression Statement Pt presents to skilled PT.  Pt has gait deviations and LE weakness. Pt is severely limited based on LEFS with 13/80 score.  Pt has limited ROM. She is the only person in her household and needs to access the second floor which is very challenges and causes a lot of pain at the moment. Pt will benefit from skilled PT to be able to care for herself independently and have lower risk of falling or injury.    Personal Factors and Comorbidities Age;Fitness;Past/Current Experience;Social Background;Comorbidity 1;Transportation;Finances;Education    Comorbidities s/p knee surgery    Examination-Activity Limitations Locomotion Level;Continence;Dressing;Bed Mobility;Bend;Transfers;Stairs;Stand;Bathing    Examination-Participation Restrictions Cleaning;Community Activity;Shop;Personal Finances    Stability/Clinical Decision Making Evolving/Moderate complexity    Clinical Decision Making Moderate    Rehab Potential Good    PT Frequency 2x / week    PT Duration 12 weeks    PT Treatment/Interventions ADLs/Self Care Home Management;Biofeedback;Cryotherapy;Electrical Stimulation;Moist Heat;Neuromuscular re-education;Therapeutic exercise;Therapeutic activities;Gait training;Patient/family education;Manual techniques;Energy conservation;Joint Manipulations;Stair training;Passive range of motion;Dry needling;Taping    PT Next Visit Plan intiial HEP; standing and gait; vaso    Consulted and Agree with Plan of Care Patient           Patient will benefit from skilled therapeutic intervention in order to improve the following deficits and impairments:  Abnormal gait,Decreased coordination,Increased fascial restricitons,Decreased endurance,Decreased activity tolerance,Pain,Increased muscle spasms,Decreased strength,Postural dysfunction,Decreased range of motion  Visit  Diagnosis: Muscle weakness (generalized)  Stiffness of left knee, not elsewhere classified  Chronic pain of left knee  Difficulty in walking, not elsewhere classified     Problem List Patient Active Problem List   Diagnosis Date Noted  . 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 Unilateral primary osteoarthritis, left knee 05/02/2020  . Light smoker 04/08/2020  . Influenza vaccine needed 02/06/2020  . Dependent for transportation 12/12/2019  . 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 Functional urinary incontinence 12/12/2019  . Rectal prolapse 10/31/2019  . Moderate episode of recurrent major depressive disorder ([place]) 10/31/2019  . Primary osteoarthritis of both knees 10/31/2019  . IDA (iron deficiency anemia) 10/30/2019  . Fibroids 10/30/2019  . Unilateral primary osteoarthritis, right knee 09/27/2019  . Alcohol abuse 11/05/2016  . Essential hypertension 11/05/2016  . Grade III hemorrhoids 11/05/2016  . GIB (gastrointestinal bleeding) 05/29/2011    Camillo Flaming Dywane Peruski 05/28/2020, 5:05 PM  [facility] [address] [place], [place], [address] Phone: [phone]   Fax:  [phone]  Name: ALAILA PILLARD MRN: [id number] Date of Birth: January 20, 1971

## 2020-05-30 ENCOUNTER — Ambulatory Visit: Payer: Medicaid Other | Admitting: Physical Therapy

## 2020-05-30 ENCOUNTER — Encounter: Payer: Self-pay | Admitting: Physical Therapy

## 2020-05-30 ENCOUNTER — Other Ambulatory Visit: Payer: Self-pay

## 2020-05-30 DIAGNOSIS — M6281 Muscle weakness (generalized): Secondary | ICD-10-CM | POA: Diagnosis not present

## 2020-05-30 DIAGNOSIS — R262 Difficulty in walking, not elsewhere classified: Secondary | ICD-10-CM

## 2020-05-30 DIAGNOSIS — G8929 Other chronic pain: Secondary | ICD-10-CM

## 2020-05-30 DIAGNOSIS — M25662 Stiffness of left knee, not elsewhere classified: Secondary | ICD-10-CM | POA: Diagnosis not present

## 2020-05-30 DIAGNOSIS — R278 Other lack of coordination: Secondary | ICD-10-CM | POA: Diagnosis not present

## 2020-05-30 DIAGNOSIS — M25562 Pain in left knee: Secondary | ICD-10-CM | POA: Diagnosis not present

## 2020-05-30 DIAGNOSIS — R601 Generalized edema: Secondary | ICD-10-CM | POA: Diagnosis not present

## 2020-05-30 NOTE — Patient Instructions (Signed)
Access Code: [email or web address] URL: [email or web address] Date: 05/30/2020 Prepared by: Earlie Counts  Exercises Supine Quad Set - 3 x daily - 7 x weekly - 1 sets - 10 reps - 5 sec hold Supine Heel Slide - 3 x daily - 7 x weekly - 1 sets - 10 reps Supine Hip Abduction - 3 x daily - 7 x weekly - 1 sets - 10 reps [facility] [address], [place],  [address] Phone # [phone] Fax [phone]

## 2020-05-30 NOTE — Therapy (Signed)
[facility] [address] [place], [place], [address] Phone: [phone]   Fax:  [phone]  Physical Therapy Treatment  Patient Details  Name: Elizabeth Bush MRN: [id number] Date of Birth: [birth year] Referring Provider (PT): [name], MD   Encounter Date: 05/30/2020   PT End of Session - 05/30/20 1012    Visit Number 2    Date for PT Re-Evaluation 08/20/20    Authorization Type [employer]    PT Start Time 0930    PT Stop Time 1020    PT Time Calculation (min) 50 min           Past Medical History:  Diagnosis Date  . Anemia   . Anxiety   . Arthritis   . Chlamydia   . Depression   . Dyspnea    when walkking   . GERD (gastroesophageal reflux disease)   . Gonorrhea   . Hypertension     Past Surgical History:  Procedure Laterality Date  . COLONOSCOPY  05/31/2011   Procedure: COLONOSCOPY;  Surgeon: Landry Dyke, MD;  Location: [facility];  Service: Endoscopy;  Laterality: N/A;  . ESOPHAGOGASTRODUODENOSCOPY  05/30/2011   Procedure: ESOPHAGOGASTRODUODENOSCOPY (EGD);  Surgeon: Landry Dyke, MD;  Location: [facility];  Service: Endoscopy;  Laterality: N/A;  . GIVENS CAPSULE STUDY  06/01/2011   Procedure: GIVENS CAPSULE STUDY;  Surgeon: Landry Dyke, MD;  Location: [facility];  Service: Endoscopy;  Laterality: N/A;  . TONSILLECTOMY    . TOTAL KNEE ARTHROPLASTY Left 05/03/2020   Procedure: LEFT TOTAL KNEE ARTHROPLASTY;  Surgeon: [name], MD;  Location: [facility];  Service: Orthopedics;  Laterality: Left;  . TUBAL LIGATION      There were no vitals filed for this visit.   Subjective Assessment - 05/30/20 0932    Subjective I am in bed or walking to the bathroom.    Limitations Walking;Standing    Patient Stated Goals increase knee ROM and strength    Currently in Pain? Yes    Pain Score 7     Pain Location Knee    Pain Orientation Left    Pain Descriptors /  Indicators Sore;Throbbing    Pain Type Surgical pain    Pain Onset More than a month ago    Pain Frequency Intermittent    Aggravating Factors  steps, standing and walking    Pain Relieving Factors sitting or pain medicine    Multiple Pain Sites No              [facility] PT Assessment - 05/30/20 0001      Assessment   Medical Diagnosis Z96.652 (ICD-10-CM) - Presence of left artificial knee joint    Referring Provider (PT) [name], MD    Onset Date/Surgical Date --   05/03/20   Prior Therapy [facility]      Precautions   Precautions Knee                         [facility] Adult PT Treatment/Exercise - 05/30/20 0001      Lumbar Exercises: Aerobic   Nustep level 1 for 6 minutes while therapist assess patient      Knee/Hip Exercises: Stretches   Active Hamstring Stretch Left;1 rep;30 seconds    Active Hamstring Stretch Limitations supine with therapist stretching    ITB Stretch Left;1 rep;30 seconds    ITB Stretch Limitations supine with therapist stretching    Piriformis  Stretch Left;1 rep;30 seconds    Piriformis Stretch Limitations supine with therapist stretching    Other Knee/Hip Stretches passive hip adductor stretch on the left holding 30 sec 1 time      Knee/Hip Exercises: Supine   Quad Sets Strengthening;Left;1 set;15 reps    Quad Sets Limitations hold 5 sec    [employer] Strengthening;Left;15 reps    Heel Slides Strengthening;AAROM;Left;1 set;15 reps    Heel Slides Limitations with verbal cues    Straight Leg Raises Strengthening;AAROM;Left;1 set;10 reps    Other Supine Knee/Hip Exercises supine hip abduction 10 x      Modalities   Modalities Vasopneumatic      Vasopneumatic   Number Minutes Vasopneumatic  15 minutes    Vasopnuematic Location  Knee   left   Vasopneumatic Pressure Medium    Vasopneumatic Temperature  34      Manual Therapy   Manual Therapy Soft tissue mobilization;Passive ROM    Soft tissue mobilization to left  knee to reduce swelling and increase tissue mobility    Passive ROM left kee flexion and extension                  PT Education - 05/30/20 1012    Education Details Access Code: [email or web address]    Person(s) Educated Patient    Methods Explanation;Demonstration;Verbal cues;Handout    Comprehension Returned demonstration;Verbalized understanding            PT Short Term Goals - 05/28/20 1615      PT SHORT TERM GOAL #1   Title independent with initial HEP    Time 4    Period Weeks    Status New      PT SHORT TERM GOAL #2   Title able to demonstrate knee strength of 4/5 Lt knee for improved gait    Time 4    Period Weeks    Status New             PT Long Term Goals - 05/28/20 1613      PT LONG TERM GOAL #1   Title independent with advanced HEP    Time 12    Period Weeks    Status New    Target Date 08/20/20      PT LONG TERM GOAL #2   Title knee flexion 120 for ability to negotiate stairs    Baseline 86 deg    Time 12    Period Weeks    Status New    Target Date 08/20/20      PT LONG TERM GOAL #3   Title LEFS improved by at least 9 points for statistically significant funcitonal improvement    Time 12    Period Weeks    Status New    Target Date 08/20/20      PT LONG TERM GOAL #4   Title pt able to go up and down stairs at home to get to the bathroom with only mild pain at most    Baseline reports severe pain 9/10    Time 12    Period Weeks    Status New    Target Date 08/20/20                 Plan - 05/30/20 1014    Clinical Impression Statement Patient needs encouragement to move her left knee in therapy and home. Patient needed help to get on the nustep and once her knee moves she feels better. Patient has  difficulty with left SLR due to weakness and left hip pain. Patient sterri strips are coming off and scar is almost fully healed. Patient has good mobility of the left patella. Patient will benefit from skilled therapy to improve left  knee ROM and strength to restore function.    Personal Factors and Comorbidities Age;Fitness;Past/Current Experience;Social Background;Comorbidity 1;Transportation;Finances;Education    Comorbidities s/p knee surgery    Examination-Activity Limitations Locomotion Level;Continence;Dressing;Bed Mobility;Bend;Transfers;Stairs;Stand;Bathing    Examination-Participation Restrictions Cleaning;Community Activity;Shop;Personal Finances    Stability/Clinical Decision Making Evolving/Moderate complexity    Rehab Potential Good    PT Frequency 2x / week    PT Duration 12 weeks    PT Treatment/Interventions ADLs/Self Care Home Management;Biofeedback;Cryotherapy;Electrical Stimulation;Moist Heat;Neuromuscular re-education;Therapeutic exercise;Therapeutic activities;Gait training;Patient/family education;Manual techniques;Energy conservation;Joint Manipulations;Stair training;Passive range of motion;Dry needling;Taping    PT Next Visit Plan work on HEP and encourage her to move; weight shifting on the trampoline, sitting LAQ, TKE, soft tissue work to the left knee    PT Home Exercise Plan Access Code: [email or web address]    Consulted and Agree with Plan of Care Patient           Patient will benefit from skilled therapeutic intervention in order to improve the following deficits and impairments:  Abnormal gait,Decreased coordination,Increased fascial restricitons,Decreased endurance,Decreased activity tolerance,Pain,Increased muscle spasms,Decreased strength,Postural dysfunction,Decreased range of motion  Visit Diagnosis: Muscle weakness (generalized)  Stiffness of left knee, not elsewhere classified  Chronic pain of left knee  Difficulty in walking, not elsewhere classified  Generalized edema     Problem List Patient Active Problem List   Diagnosis Date Noted  . 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 Unilateral primary osteoarthritis, left knee 05/02/2020  . Light smoker 04/08/2020  .  Influenza vaccine needed 02/06/2020  . Dependent for transportation 12/12/2019  . 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 Functional urinary incontinence 12/12/2019  . Rectal prolapse 10/31/2019  . Moderate episode of recurrent major depressive disorder ([place]) 10/31/2019  . Primary osteoarthritis of both knees 10/31/2019  . IDA (iron deficiency anemia) 10/30/2019  . Fibroids 10/30/2019  . Unilateral primary osteoarthritis, right knee 09/27/2019  . Alcohol abuse 11/05/2016  . Essential hypertension 11/05/2016  . Grade III hemorrhoids 11/05/2016  . GIB (gastrointestinal bleeding) 05/29/2011    Earlie Counts, PT 05/30/20 10:18 AM   [facility] [address] [place], [place], [address] Phone: [phone]   Fax:  [phone]  Name: Elizabeth Bush MRN: [id number] Date of Birth: [birth year]

## 2020-05-31 ENCOUNTER — Other Ambulatory Visit: Payer: Self-pay | Admitting: *Deleted

## 2020-05-31 DIAGNOSIS — Z139 Encounter for screening, unspecified: Secondary | ICD-10-CM

## 2020-05-31 NOTE — Patient Outreach (Signed)
Medicaid Managed Care   Nurse Care Manager Note  05/31/2020 Name:  NOMI RUDNICKI MRN:  [id number] DOB:  1970-11-12  [name] is an 50 y.o. year old female who is a primary patient of Ladell Pier, MD.  The [facility] Managed Care Coordination team was consulted for assistance with:    Osteoarthritis  Ms. Gramlich was given information about Medicaid Managed Care Coordination team services today. Ronnette Juniper Borntreger agreed to services and verbal consent obtained.  Engaged with patient by telephone for initial visit in response to provider referral for case management and/or care coordination services.   Assessments/Interventions:  Review of past medical history, allergies, medications, health status, including review of consultants reports, laboratory and other test data, was performed as part of comprehensive evaluation and provision of chronic care management services.  SDOH (Social Determinants of Health) assessments and interventions performed:   Care Plan  Allergies  Allergen Reactions  . Dilaudid [Hydromorphone] Anxiety and Other (See Comments)    Patient gets paranoid and has temporary delirium   . Lisinopril Swelling    Swelling of mouth/lips    Medications Reviewed Today    Reviewed by [name], RN (Registered Nurse) on 05/31/20 at 55  Med List Status: <None>  Medication Order Taking? Sig Documenting Provider Last Dose Status Informant  amLODipine (NORVASC) 10 MG tablet [id number] Yes Take 1 tablet (10 mg total) by mouth daily. Ladell Pier, MD Taking Active Self  aspirin 81 MG chewable tablet [id number] No Chew 1 tablet (81 mg total) by mouth 2 (two) times daily.  Patient not taking: Reported on 05/31/2020   [name], MD Not Taking Active   celecoxib (CELEBREX) 200 MG capsule [id number] Yes Take 1 capsule (200 mg total) by mouth daily. Ladell Pier, MD Taking Active Self       Patient not taking:      Discontinued 05/03/20 1030         Patient not taking:      Discontinued 05/03/20 1030   methocarbamol (ROBAXIN) 500 MG tablet [id number] No Take 1 tablet (500 mg total) by mouth every 6 (six) hours as needed for muscle spasms.  Patient not taking: Reported on 05/31/2020   [name], MD Not Taking Active            Med Note [name], Hatice Bubel A   Fri May 31, 2020  4:07 PM) Needs a refill  Multiple Vitamins-Minerals (MULTIVITAMIN WITH MINERALS) tablet [id number] No Take 1 tablet by mouth daily.  Patient not taking: Reported on 05/31/2020   [provider] Not Taking Active Self  omeprazole (PRILOSEC) 20 MG capsule [id number]  Take 1 capsule (20 mg total) by mouth in the morning and at bedtime for 14 days. Cirigliano, Vito V, DO  Expired 05/08/20 2359   oxyCODONE (OXY IR/ROXICODONE) 5 MG immediate release tablet [id number] Yes Take 1-2 tablets (5-10 mg total) by mouth every 4 (four) hours as needed for moderate pain (pain score 4-6). [name], MD Taking Active           Patient Active Problem List   Diagnosis Date Noted  . 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 Unilateral primary osteoarthritis, left knee 05/02/2020  . Light smoker 04/08/2020  . Influenza vaccine needed 02/06/2020  . Dependent for transportation 12/12/2019  . 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 Functional urinary incontinence 12/12/2019  . Rectal prolapse 10/31/2019  . Moderate episode of recurrent major depressive disorder (HCC)  10/31/2019  . Primary osteoarthritis of both knees 10/31/2019  . IDA (iron deficiency anemia) 10/30/2019  . Fibroids 10/30/2019  . Unilateral primary osteoarthritis, right knee 09/27/2019  . Alcohol abuse 11/05/2016  . Essential hypertension 11/05/2016  . Grade III hemorrhoids 11/05/2016  . GIB (gastrointestinal bleeding) 05/29/2011    Conditions to be addressed/monitored per PCP order:  osteoarthritis  Care Plan : Post surgical knee replacement  Updates made by [name], RN since 05/31/2020 12:00 AM    Problem: Managing knee replacement     Goal: Self-Management Plan Developed   Start Date: 05/31/2020  Expected End Date: 07/02/2020  This Visit's Progress: On track  Priority: High  Note:   Current Barriers:  Marland Kitchen Knowledge Deficits related to managing post surgical care. Ms Mellone had total knee replacement about 1 month ago. She has been unable to get some of her medications due to transportation and financial barriers. She is attending physical therapy and reports increased pain after therapy. She lives alone and depends on friends and neighbors to bring her meals. She has been using [facility] transportation for medical appointments. . Film/video editor.  . Transportation barriers . Difficulty obtaining medications  Nurse Case Manager Clinical Goal(s):  Marland Kitchen Over the next 30 days, patient will verbalize understanding of plan for post surgical management . Over the next 30 days, patient will attend all scheduled medical appointments . Over the next 30 days, patient will work with CM team pharmacist to discuss medications options for medication delivery . Over the next 30 days, patient will work with [place] for food insecurity . Over the next 30 days, patient will work with physical therapy for increased mobility  Interventions:  . Inter-disciplinary care team collaboration (see longitudinal plan of care) . Advised patient to ice left knee, especially after physical therapy . Provided education to patient re: knee replacement . Discussed plans with patient for ongoing care management follow up and provided patient with direct contact information for care management team . Reviewed scheduled/upcoming provider appointments  . Care Guide referral for food resources . Pharmacy referral for medication review and options for medication delivery  Patient Goals/Self-Care Activities Over the next 30 days, patient will: - attend all scheduled  appointments - call for medicine refill 2 or 3 days before it runs out - keep a list of all the medicines I take; vitamins and herbals too  - utilize [facility] transportation [phone] for medical appointments including pharmacy needs - begin a notebook of services in my neighborhood or community - call 211 when I need some help - follow-up on any referrals for help I am given - think ahead to make sure my need does not become an emergency - make a list of family or friends that I can call     Follow Up Plan: Telephone follow up appointment with Managed Medicaid care management team member scheduled for:07/02/20 @ 1pm        Follow Up:  Patient agrees to Care Plan and Follow-up.  Plan: The Managed Medicaid care management team will reach out to the patient again over the next 30 days.  Date/time of next scheduled RN care management/care coordination outreach:  07/02/20 @ 1pm  [name], [place] RN Care Coordinator

## 2020-05-31 NOTE — Patient Instructions (Signed)
Visit Information  Ms. Ritchey was given information about Medicaid Managed Care team care coordination services as a part of their [facility] Medicaid benefit. Mahlia Bailer Thayer verbally consented to engagement with the [facility] Managed Care team.   For questions related to your [facility] health plan, please call: [phone]  If you would like to schedule transportation through your [facility] plan, please call the following number at least 2 days in advance of your appointment: [phone]  Ms. Hreha - following are the goals we discussed in your visit today:  Goals Addressed            This Visit's Progress   . Find Help in My Community       Timeframe:  Short-Term Goal Priority:  High Start Date:   05/31/20                          Expected End Date:  07/02/20                     - utilize [facility] transportation [phone] for medical appointments including pharmacy needs - begin a notebook of services in my neighborhood or community - call 211 when I need some help - follow-up on any referrals for help I am given - think ahead to make sure my need does not become an emergency - make a list of family or friends that I can call    Why is this important?    Knowing how and where to find help for yourself or family in your neighborhood and community is an important skill.   You will want to take some steps to learn how.        . Manage My Medicine       Timeframe:  Short-Term Goal Priority:  High Start Date:  05/31/20                           Expected End Date:  07/02/20                     - attend all scheduled appointments - call for medicine refill 2 or 3 days before it runs out - keep a list of all the medicines I take; vitamins and herbals too    Why is this important?   . These steps will help you keep on track with your medicines.          Please see education materials related to knee replacement provided by MyChart link.    Total Knee  Replacement, Care After After the procedure, it is common to have stiffness and discomfort, or redness, pain, and swelling around your cut from surgery (incision). You may also have a small amount of blood or clear fluid coming from your incision. Follow these instructions at home: Your doctor may give you more instructions. If you have problems, contact your doctor. Medicines  Take over-the-counter and prescription medicines only as told by your doctor.  If you were prescribed a blood thinner, take it as told by your doctor.  If told, take steps to prevent problems with pooping (constipation). You may need to: ? Drink enough fluid to keep your pee (urine) pale yellow. ? Take medicines. You will be told what medicines to take. ? Eat foods that are high in fiber. These include beans, whole grains, and fresh fruits and vegetables. ? Limit foods that are  high in fat and sugar. These include fried or sweet foods. Incision care  Follow instructions from your doctor about how to take care of your incision. Make sure you: ? Wash your hands with soap and water for at least 20 seconds before and after you change your bandage. If you cannot use soap and water, use hand sanitizer. ? Change your bandage. ? Leave stitches, staples, or skin glue in place for at least 2 weeks. ? Leave tape strips alone unless you are told to take them off. You may trim the edges of the tape strips if they curl up.  Do not take baths, swim, or use a hot tub until your doctor says it is okay.  Check your incision every day for signs of infection. Check for: ? More redness, swelling, or pain. ? More fluid or blood. ? Warmth. ? Pus or a bad smell.   Activity  Rest as told by your doctor.  Get up to take short walks every 1 to 2 hours. Ask for help if you feel weak or unsteady.  Follow instructions from your doctor about: ? Using a walker, crutches, or a cane. ? How much body weight you may safely support on your  affected leg (weight-bearing restrictions). ? How to get out of a bed and chair and how to go up and down stairs. A physical therapist will show you how to do this.  Do exercises as told by your doctor or physical therapist.  Avoid activities that put stress on your knees. These include running, jumping rope, and doing jumping jacks.  Do not play contact sports until your doctor says it is okay.  Return to your normal activities when your doctor says that it is safe. Managing pain, stiffness, and swelling  If told, put ice on your knee. To do this: ? Put ice in a plastic bag or use an icing device (cold flow pad). Follow your doctor's directions about how to use the icing device. ? Place a towel between your skin and the bag or between your skin and the icing device. ? Leave the ice on for 20 minutes, 2-3 times a day. ? Take off the ice if your skin turns bright red. This is very important. If you cannot feel pain, heat, or cold, you have a greater risk of damage to the area.  Move your toes often.  Raise your leg above the level of your heart while you are sitting or lying down. ? Use a few pillows to keep your leg straight. ? Do not put a pillow just under the knee. If the knee is bent for a long time, this may make the knee stiff.  Wear elastic knee support as told by your doctor.   Safety  To help prevent falls: ? Keep floors clear of objects you may trip over. ? Place items that you may need within easy reach.  Wear an apron or tool belt with pockets for carrying objects. This leaves your hands free to help with your balance.  Do not drive or use machines until your doctor says it is safe.   General instructions  Wear compression stockings as told by your doctor.  Continue with breathing exercises. This helps prevent lung infection.  Do not smoke or use any products that contain nicotine or tobacco. If you need help quitting, ask your doctor.  If you plan to visit a  dentist: ? Tell your doctor. Ask about things to do before your teeth  are cleaned. ? Tell your dentist about your new joint.  Keep all follow-up visits. Contact a doctor if:  You have a fever or chills.  You have a cough or feel short of breath.  Your medicine is not controlling your pain.  You have any of these signs of infection around your incision: ? More redness, swelling, or pain. ? More fluid or blood. ? Warmth. ? Pus or a bad smell.  You fall. Get help right away if:  You have very bad pain.  You have trouble breathing.  You have chest pain.  You have pain in your calf or leg.  You have redness, swelling, or warmth in your calf or leg.  Your incision breaks open after the stitches or staples are taken out. These symptoms may be an emergency. Get help right away. Call your local emergency services (911 in the [place]  Do not wait to see if the symptoms will go away.  Do not drive yourself to the hospital. Summary  After the procedure, it is common to have stiffness and discomfort, or redness, pain, and swelling around your incision.  Follow instructions from your doctor about how to take care of your incision.  Use crutches, a walker, or a cane as told by your doctor.  If you were prescribed a blood thinner, take it as told by your doctor.  Keep all follow-up visits. This information is not intended to replace advice given to you by your health care provider. Make sure you discuss any questions you have with your health care provider. Document Revised: 10/24/2019 Document Reviewed: 10/24/2019 Elsevier Patient Education  2021 [employer].   Patient verbalizes understanding of instructions provided today.   Telephone follow up appointment with Managed Medicaid care management team member scheduled for:07/02/20 @ [place], RN  Following is a copy of your plan of care:  Patient Care Plan: Post surgical knee replacement    Problem  Identified: Managing knee replacement     Goal: Self-Management Plan Developed   Start Date: 05/31/2020  Expected End Date: 07/02/2020  This Visit's Progress: On track  Priority: High  Note:   Current Barriers:  Marland Kitchen Knowledge Deficits related to managing post surgical care. Ms Runkel had total knee replacement about 1 month ago. She has been unable to get some of her medications due to transportation and financial barriers. She is attending physical therapy and reports increased pain after therapy. She lives alone and depends on friends and neighbors to bring her meals. She has been using [facility] transportation for medical appointments. . Film/video editor.  . Transportation barriers . Difficulty obtaining medications  Nurse Case Manager Clinical Goal(s):  Marland Kitchen Over the next 30 days, patient will verbalize understanding of plan for post surgical management . Over the next 30 days, patient will attend all scheduled medical appointments . Over the next 30 days, patient will work with CM team pharmacist to discuss medications options for medication delivery . Over the next 30 days, patient will work with [place] for food insecurity . Over the next 30 days, patient will work with physical therapy for increased mobility  Interventions:  . Inter-disciplinary care team collaboration (see longitudinal plan of care) . Advised patient to ice left knee, especially after physical therapy . Provided education to patient re: knee replacement . Discussed plans with patient for ongoing care management follow up and provided patient with direct contact information for care management team . Reviewed scheduled/upcoming provider appointments  .  Care Guide referral for food resources . Pharmacy referral for medication review and options for medication delivery  Patient Goals/Self-Care Activities Over the next 30 days, patient will: - attend all scheduled appointments - call for medicine refill 2 or 3  days before it runs out - keep a list of all the medicines I take; vitamins and herbals too  - utilize [facility] transportation [phone] for medical appointments including pharmacy needs - begin a notebook of services in my neighborhood or community - call 211 when I need some help - follow-up on any referrals for help I am given - think ahead to make sure my need does not become an emergency - make a list of family or friends that I can call     Follow Up Plan: Telephone follow up appointment with Managed Medicaid care management team member scheduled for:07/02/20 @ 1pm

## 2020-06-03 ENCOUNTER — Encounter: Payer: Medicaid Other | Admitting: Physical Therapy

## 2020-06-04 ENCOUNTER — Telehealth: Payer: Self-pay | Admitting: Internal Medicine

## 2020-06-04 NOTE — Telephone Encounter (Signed)
   Telephone encounter was:  Successful.  06/04/2020 Name: Elizabeth Bush MRN: [id number] DOB: [birth year]  [name] is a 50 y.o. year old female who is a primary care patient of Ladell Pier, MD . The community resource team was consulted for assistance with in home care giver for recent knee replacement. she is fine with food at the moment, she cannot stand long periods to cook.   Care guide performed the following interventions: Patient provided with information about care guide support team and interviewed to confirm resource needs Investigation of community resources performed Obtained verbal consent to place patient referral to [facility] for in home care Placed referral to [place] at [place] via secure email and telephone message. .  Follow Up Plan:  Care guide will outreach resources to assist patient with in home care to help around the house because of her recent knee surgery  [place], Care Management Phone: [phone] Email: [email or web address]

## 2020-06-05 ENCOUNTER — Other Ambulatory Visit: Payer: Self-pay

## 2020-06-05 NOTE — Patient Outreach (Signed)
Medicaid Managed Care    Pharmacy Note  06/05/2020 Name: Elizabeth Bush MRN: [id number] DOB: 11-Feb-1971  Elizabeth Bush is a 50 y.o. year old female who is a primary care patient of Ladell Pier, MD. The [facility] Managed Care Coordination team was consulted for assistance with disease management and care coordination needs.    Engaged with patient Engaged with patient by telephone for initial visit in response to referral for case management and/or care coordination services.  [name] was given information about Managed Medicaid Care Coordination team services today. Ronnette Juniper Alves agreed to services and verbal consent obtained.   Objective:  Lab Results  Component Value Date   CREATININE 0.78 05/04/2020   CREATININE 0.57 04/30/2020   CREATININE 0.69 04/01/2020    Lab Results  Component Value Date   HGBA1C 5.0 09/15/2019       Component Value Date/Time   CHOL 189 09/15/2019 0909   TRIG 97 09/15/2019 0909   HDL 60 09/15/2019 0909   CHOLHDL 3.2 09/15/2019 0909   LDLCALC 112 (H) 09/15/2019 0909    Other: (TSH, CBC, Vit D, etc.)  Clinical ASCVD: No  The 10-year ASCVD risk score Mikey Bussing DC Jr., et al., 2013) is: 2.5%   Values used to calculate the score:     Age: 50 years     Sex: Female     Is Non-Hispanic African American: Yes     Diabetic: No     Tobacco smoker: Yes     Systolic Blood Pressure: 510 mmHg     Is BP treated: Yes     HDL Cholesterol: 60 mg/dL     Total Cholesterol: 189 mg/dL    Other: (CHADS2VASc if Afib, PHQ9 if depression, MMRC or CAT for COPD, ACT, DEXA)  BP Readings from Last 3 Encounters:  05/05/20 104/75  04/30/20 136/87  04/26/20 115/63    Assessment/Interventions: Review of patient past medical history, allergies, medications, health status, including review of consultants reports, laboratory and other test data, was performed as part of comprehensive evaluation and provision of chronic care management services.    Cardio BP Readings  from Last 3 Encounters:  05/05/20 104/75  04/30/20 136/87  04/26/20 115/63   Amlodipine 10mg  Plan: At goal,  patient stable/ symptoms controlled   Pain Managed by Dr. Philipp Ovens, Sx With Meds:   8/10           Without: 10/10 Left Knee Replacement December 2021 Methocarbamol Oxycodone 5mg  IR Celecoxib 200mg  Plan: At goal,  patient stable/ symptoms controlled   GERD Omeprazole -Hx of Anemia due to blood loss .Plan: Will ask PCP if this is long term or acute therapy  Meds: -Can't afford meds and has no transportation Plan: Verbal consent obtained for [facility] enhanced pharmacy services (medication synchronization, adherence packaging, delivery coordination). A medication sync plan was created to allow patient to get all medications delivered once every 30 to 90 days per patient preference. Patient understands they have freedom to choose pharmacy and clinical pharmacist will coordinate care between all prescribers and [facility].     SDOH (Social Determinants of Health) assessments and interventions performed:    Care Plan  Allergies  Allergen Reactions  . Dilaudid [Hydromorphone] Anxiety and Other (See Comments)    Patient gets paranoid and has temporary delirium   . Lisinopril Swelling    Swelling of mouth/lips    Medications Reviewed Today    Reviewed by Lane Hacker, [facility] (Pharmacist) on 06/05/20 at 1432  Med List Status: <None>  Medication Order Taking? Sig Documenting Provider Last Dose Status Informant  amLODipine (NORVASC) 10 MG tablet [id number] Yes Take 1 tablet (10 mg total) by mouth daily. Ladell Pier, MD Taking Active Self  aspirin 81 MG chewable tablet [id number] No Chew 1 tablet (81 mg total) by mouth 2 (two) times daily.  Patient not taking: No sig reported   [name], MD Not Taking Active   celecoxib (CELEBREX) 200 MG capsule [id number] Yes Take 1 capsule (200 mg total) by mouth daily. Ladell Pier, MD Taking  Active Self       Patient not taking:      Discontinued 05/03/20 1030        Patient not taking:      Discontinued 05/03/20 1030   methocarbamol (ROBAXIN) 500 MG tablet [id number] Yes Take 1 tablet (500 mg total) by mouth every 6 (six) hours as needed for muscle spasms. [name], MD Taking Active            Med Note [name], MELANIE A   Fri May 31, 2020  4:07 PM) Needs a refill  Multiple Vitamins-Minerals (MULTIVITAMIN WITH MINERALS) tablet [id number] Yes Take 1 tablet by mouth daily. [provider] Taking Active   omeprazole (PRILOSEC) 20 MG capsule [id number]  Take 1 capsule (20 mg total) by mouth in the morning and at bedtime for 14 days. Cirigliano, Vito V, DO  Expired 05/08/20 2359   oxyCODONE (OXY IR/ROXICODONE) 5 MG immediate release tablet [id number] Yes Take 1-2 tablets (5-10 mg total) by mouth every 4 (four) hours as needed for moderate pain (pain score 4-6). [name], MD Taking Active           Patient Active Problem List   Diagnosis Date Noted  . 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 Unilateral primary osteoarthritis, left knee 05/02/2020  . Light smoker 04/08/2020  . Influenza vaccine needed 02/06/2020  . Dependent for transportation 12/12/2019  . 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 Functional urinary incontinence 12/12/2019  . Rectal prolapse 10/31/2019  . Moderate episode of recurrent major depressive disorder ([place]) 10/31/2019  . Primary osteoarthritis of both knees 10/31/2019  . IDA (iron deficiency anemia) 10/30/2019  . Fibroids 10/30/2019  . Unilateral primary osteoarthritis, right knee 09/27/2019  . Alcohol abuse 11/05/2016  . Essential hypertension 11/05/2016  . Grade III hemorrhoids 11/05/2016  . GIB (gastrointestinal bleeding) 05/29/2011    Conditions to be addressed/monitored: HTN   Care Plan : Medication Management  Updates made by Lane Hacker, [place] since 06/05/2020 12:00 AM    Problem:  Health Promotion or Disease Self-Management (General Plan of Care)     Goal: Medication Management   Note:   Current Barriers:  . Unable to independently afford treatment regimen . Unable to self administer medications as prescribed . Does not maintain contact with provider office . Transportation to pick up medications  Pharmacist Clinical Goal(s):  Marland Kitchen Over the next 30 days, patient will verbalize ability to afford treatment regimen . achieve adherence to monitoring guidelines and medication adherence to achieve therapeutic efficacy . contact provider office for questions/concerns as evidenced notation of same in electronic health record through collaboration with PharmD and provider.  .   Interventions: . Inter-disciplinary care team collaboration (see longitudinal plan of care) . Comprehensive medication review performed; medication list updated in electronic medical record  Verbal consent obtained for [facility] enhanced pharmacy services (medication synchronization, adherence packaging, delivery coordination). A  medication sync plan was created to allow patient to get all medications delivered once every 30 to 90 days per patient preference. Patient understands they have freedom to choose pharmacy and clinical pharmacist will coordinate care between all prescribers and [facility].   @RXCPHYPERTENSION @ Pain  Patient Goals/Self-Care Activities . Over the next 30 days, patient will:  - take medications as prescribed collaborate with provider on medication access solutions  Follow Up Plan: The care management team will reach out to the patient again over the next 30 days.     Task: Mutually Develop and Royce Macadamia Achievement of Patient Goals   Note:   Care Management Activities:    - verbalization of feelings encouraged    Notes:     Medication Assistance: None required. Patient affirms current coverage meets needs.   Follow up: Agree  Plan: The care  management team will reach out to the patient again over the next 30 days.   [place] Constable, Pharm.D., Managed Medicaid Pharmacist - [phone]

## 2020-06-05 NOTE — Patient Instructions (Signed)
Visit Information  Elizabeth Bush was given information about Medicaid Managed Care Bush care coordination services as a part of their [facility] Medicaid benefit. Elizabeth Bush verbally consented to engagement with the Elizabeth Bush.   For questions related to your [facility] health plan, please call: [phone]  If you would like to schedule transportation through your [facility] plan, please call the following number at least 2 days in advance of your appointment: [phone]  Elizabeth Bush - following are the goals we discussed in your visit today:  Goals Addressed   None     Please see education materials related to HTN provided as print materials.   Patient verbalizes understanding of instructions provided today.   The Managed Medicaid care management Bush will reach out to the patient again over the next 30 days.   Lane Hacker, [facility]  Following is a copy of your plan of care:  Patient Care Plan: Post surgical knee replacement    Problem Identified: Managing knee replacement     Goal: Self-Management Plan Developed   Start Date: 05/31/2020  Expected End Date: 07/02/2020  This Visit's Progress: On track  Priority: High  Note:   Current Barriers:  Marland Kitchen Knowledge Deficits related to managing post surgical care. Elizabeth Bush had total knee replacement about 1 month ago. She has been unable to get some of her medications due to transportation and financial barriers. She is attending physical therapy and reports increased pain after therapy. She lives alone and depends on friends and neighbors to bring her meals. She has been using [facility] transportation for medical appointments. . Film/video editor.  . Transportation barriers . Difficulty obtaining medications  Nurse Case Manager Clinical Goal(s):  Marland Kitchen Over the next 30 days, patient will verbalize understanding of plan for post surgical management . Over the next 30 days, patient will attend all scheduled  medical appointments . Over the next 30 days, patient will work with CM Bush pharmacist to discuss medications options for medication delivery . Over the next 30 days, patient will work with [place] for food insecurity . Over the next 30 days, patient will work with physical therapy for increased mobility  Interventions:  . Inter-disciplinary care Bush collaboration (see longitudinal plan of care) . Advised patient to ice left knee, especially after physical therapy . Provided education to patient re: knee replacement . Discussed plans with patient for ongoing care management follow up and provided patient with direct contact information for care management Bush . Reviewed scheduled/upcoming provider appointments  . Care Guide referral for food resources . Pharmacy referral for medication review and options for medication delivery  Patient Goals/Self-Care Activities Over the next 30 days, patient will: - attend all scheduled appointments - call for medicine refill 2 or 3 days before it runs out - keep a list of all the medicines I take; vitamins and herbals too  - utilize [facility] transportation [phone] for medical appointments including pharmacy needs - begin a notebook of services in my neighborhood or community - call 211 when I need some help - follow-up on any referrals for help I am given - think ahead to make sure my need does not become an emergency - make a list of family or friends that I can call     Follow Up Plan: Telephone follow up appointment with Managed Medicaid care management Bush member scheduled for:07/02/20 @ 1pm

## 2020-06-06 ENCOUNTER — Encounter: Payer: Self-pay | Admitting: Physical Therapy

## 2020-06-06 ENCOUNTER — Other Ambulatory Visit: Payer: Self-pay

## 2020-06-06 ENCOUNTER — Ambulatory Visit: Payer: Medicaid Other | Admitting: Physical Therapy

## 2020-06-06 DIAGNOSIS — R601 Generalized edema: Secondary | ICD-10-CM | POA: Diagnosis not present

## 2020-06-06 DIAGNOSIS — M25662 Stiffness of left knee, not elsewhere classified: Secondary | ICD-10-CM | POA: Diagnosis not present

## 2020-06-06 DIAGNOSIS — R262 Difficulty in walking, not elsewhere classified: Secondary | ICD-10-CM

## 2020-06-06 DIAGNOSIS — M25562 Pain in left knee: Secondary | ICD-10-CM | POA: Diagnosis not present

## 2020-06-06 DIAGNOSIS — G8929 Other chronic pain: Secondary | ICD-10-CM

## 2020-06-06 DIAGNOSIS — M6281 Muscle weakness (generalized): Secondary | ICD-10-CM

## 2020-06-06 DIAGNOSIS — R278 Other lack of coordination: Secondary | ICD-10-CM | POA: Diagnosis not present

## 2020-06-06 NOTE — Therapy (Addendum)
[facility] [address] [place], [place], [address] Phone: [phone]   Fax:  [phone]  Physical Therapy Treatment  Patient Details  Name: Elizabeth Bush MRN: [id number] Date of Birth: 03-16-1971 Referring Provider (PT): [name], MD   Encounter Date: 06/06/2020   PT End of Session - 06/06/20 1003    Visit Number 3    Date for PT Re-Evaluation 08/20/20    Authorization Type [employer]    Authorization Time Period 03/27/2020-05/26/2020    PT Start Time 0930    PT Stop Time 1020    PT Time Calculation (min) 50 min    Activity Tolerance Patient tolerated treatment well;No increased pain    Behavior During Therapy WFL for tasks assessed/performed           Past Medical History:  Diagnosis Date  . Anemia   . Anxiety   . Arthritis   . Chlamydia   . Depression   . Dyspnea    when walkking   . GERD (gastroesophageal reflux disease)   . Gonorrhea   . Hypertension     Past Surgical History:  Procedure Laterality Date  . COLONOSCOPY  05/31/2011   Procedure: COLONOSCOPY;  Surgeon: Landry Dyke, MD;  Location: [facility] ENDOSCOPY;  Service: Endoscopy;  Laterality: N/A;  . ESOPHAGOGASTRODUODENOSCOPY  05/30/2011   Procedure: ESOPHAGOGASTRODUODENOSCOPY (EGD);  Surgeon: Landry Dyke, MD;  Location: [facility];  Service: Endoscopy;  Laterality: N/A;  . GIVENS CAPSULE STUDY  06/01/2011   Procedure: GIVENS CAPSULE STUDY;  Surgeon: Landry Dyke, MD;  Location: [facility] ENDOSCOPY;  Service: Endoscopy;  Laterality: N/A;  . TONSILLECTOMY    . TOTAL KNEE ARTHROPLASTY Left 05/03/2020   Procedure: LEFT TOTAL KNEE ARTHROPLASTY;  Surgeon: [name], MD;  Location: [facility] ORS;  Service: Orthopedics;  Laterality: Left;  . TUBAL LIGATION      There were no vitals filed for this visit.   Subjective Assessment - 06/06/20 0937    Subjective I was hurting after last visit and had trouble sleeping.     Limitations Walking;Standing    Patient Stated Goals increase knee ROM and strength    Currently in Pain? Yes    Pain Score 8     Pain Location Knee    Pain Orientation Left    Pain Descriptors / Indicators Throbbing;Sore    Pain Type Surgical pain    Pain Onset More than a month ago    Pain Frequency Intermittent    Aggravating Factors  steps, standing and walking    Pain Relieving Factors sitting or pain medicine    Multiple Pain Sites No              [facility] PT Assessment - 06/06/20 0001      Assessment   Medical Diagnosis Z96.652 (ICD-10-CM) - Presence of left artificial knee joint    Referring Provider (PT) [name], MD    Onset Date/Surgical Date --   05/03/20   Prior Therapy [facility]      Precautions   Precautions Knee      Restrictions   Weight Bearing Restrictions No      Prior Function   Level of Independence Independent    Vocation Unemployed      Cognition   Overall Cognitive Status Within Functional Limits for tasks assessed      Posture/Postural Control   Posture/Postural Control Postural limitations    Postural Limitations Flexed trunk    Posture  Comments valgus of knees, walks with a cane      PROM   Left Knee Extension -5    Left Knee Flexion 105      Strength   Left Knee Flexion 3+/5    Left Knee Extension 3+/5                         [facility] Adult PT Treatment/Exercise - 06/06/20 0001      Lumbar Exercises: Aerobic   Nustep level 1 for 6 minutes while therapist assess patient      Knee/Hip Exercises: Standing   Rocker Board 2 minutes    Rocker Board Limitations VC to only move the ankle    Rebounder all 3 ways 1 minute each      Knee/Hip Exercises: Supine   Short Arc Quad Sets Strengthening;Left;15 reps    Bridges Strengthening;15 reps    Bridges Limitations knees on white bolster    Straight Leg Raises Strengthening;Left;1 set;15 reps    Straight Leg Raises Limitations able to do on her own    Other Supine  Knee/Hip Exercises supine foot on red physioball rolling back and forth to increase ROM      Modalities   Modalities Vasopneumatic      Vasopneumatic   Number Minutes Vasopneumatic  15 minutes    Vasopnuematic Location  Knee   left   Vasopneumatic Pressure Medium    Vasopneumatic Temperature  34      Manual Therapy   Manual Therapy Soft tissue mobilization    Soft tissue mobilization using the addaday to the left quadriceps while stretching the hip flexors                    PT Short Term Goals - 06/06/20 1011      PT SHORT TERM GOAL #1   Title independent with initial HEP    Time 4    Period Weeks    Status On-going    Target Date 04/25/20      PT SHORT TERM GOAL #2   Title able to demonstrate knee strength of 4/5 Lt knee for improved gait    Time 4    Period Weeks    Status On-going             PT Long Term Goals - 05/28/20 1613      PT LONG TERM GOAL #1   Title independent with advanced HEP    Time 12    Period Weeks    Status New    Target Date 08/20/20      PT LONG TERM GOAL #2   Title knee flexion 120 for ability to negotiate stairs    Baseline 86 deg    Time 12    Period Weeks    Status New    Target Date 08/20/20      PT LONG TERM GOAL #3   Title LEFS improved by at least 9 points for statistically significant funcitonal improvement    Time 12    Period Weeks    Status New    Target Date 08/20/20      PT LONG TERM GOAL #4   Title pt able to go up and down stairs at home to get to the bathroom with only mild pain at most    Baseline reports severe pain 9/10    Time 12    Period Weeks    Status New    Target  Date 08/20/20                 Plan - 06/06/20 1005    Clinical Impression Statement Patient is a 50 year old female s/p left TKR on 05/03/2020. She had home health for a period of time. She started at outpatient rehab on 05/28/2020. Patient is now  able to do a SLR and SAQ by herself for the first time. Patient has  increased Left knee flexion passively to 105 degrees and extension is -5 degrees. Patient has tried new exercises with no increased in pain. Patient is not able to tolerate the vasopnuematic device again so we will not be using it in her treatment. Paitent did the stairs at home. Patient has improve quadricep contraction. Patient will benefit from skilled therapy to improve left knee ROM and strength to restore function.    Personal Factors and Comorbidities Age;Fitness;Past/Current Experience;Social Background;Comorbidity 1;Transportation;Finances;Education    Comorbidities s/p knee surgery    Examination-Activity Limitations Locomotion Level;Continence;Dressing;Bed Mobility;Bend;Transfers;Stairs;Stand;Bathing    Examination-Participation Restrictions Cleaning;Community Activity;Shop;Personal Finances    Stability/Clinical Decision Making Evolving/Moderate complexity    Rehab Potential Good    PT Frequency 2x / week    PT Duration 12 weeks    PT Treatment/Interventions ADLs/Self Care Home Management;Biofeedback;Cryotherapy;Electrical Stimulation;Moist Heat;Neuromuscular re-education;Therapeutic exercise;Therapeutic activities;Gait training;Patient/family education;Manual techniques;Energy conservation;Joint Manipulations;Stair training;Passive range of motion;Dry needling;Taping    PT Next Visit Plan work on HEP and encourage her to move; weight shifting on the trampoline, sitting LAQ, TKE, soft tissue work to the left knee; leg press; work on stairs    PT Home Exercise Plan Access Code: [email or web address]    Consulted and Agree with Plan of Care Patient           Patient will benefit from skilled therapeutic intervention in order to improve the following deficits and impairments:  Abnormal gait,Decreased coordination,Increased fascial restricitons,Decreased endurance,Decreased activity tolerance,Pain,Increased muscle spasms,Decreased strength,Postural dysfunction,Decreased range of motion  Visit  Diagnosis: Muscle weakness (generalized)  Stiffness of left knee, not elsewhere classified  Chronic pain of left knee  Difficulty in walking, not elsewhere classified  Generalized edema     Problem List Patient Active Problem List   Diagnosis Date Noted  . 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 Unilateral primary osteoarthritis, left knee 05/02/2020  . Light smoker 04/08/2020  . Influenza vaccine needed 02/06/2020  . Dependent for transportation 12/12/2019  . 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 Functional urinary incontinence 12/12/2019  . Rectal prolapse 10/31/2019  . Moderate episode of recurrent major depressive disorder ([place]) 10/31/2019  . Primary osteoarthritis of both knees 10/31/2019  . IDA (iron deficiency anemia) 10/30/2019  . Fibroids 10/30/2019  . Unilateral primary osteoarthritis, right knee 09/27/2019  . Alcohol abuse 11/05/2016  . Essential hypertension 11/05/2016  . Grade III hemorrhoids 11/05/2016  . GIB (gastrointestinal bleeding) 05/29/2011    Earlie Counts, PT 06/06/20 11:15 AM   [facility] [address] [place], [place], [address] Phone: [phone]   Fax:  [phone]  Name: JORDANNE ELSBURY MRN: [id number] Date of Birth: [birth year]

## 2020-06-10 ENCOUNTER — Ambulatory Visit: Payer: Medicaid Other | Admitting: Physical Therapy

## 2020-06-11 ENCOUNTER — Telehealth: Payer: Self-pay | Admitting: Internal Medicine

## 2020-06-11 ENCOUNTER — Other Ambulatory Visit: Payer: Self-pay

## 2020-06-11 NOTE — Telephone Encounter (Signed)
° °  Telephone encounter was:  Unsuccessful.  06/11/2020 Name: Elizabeth Bush MRN: [id number] DOB: 02-Jul-1970  Unsuccessful outbound call made today to assist with:  Food Insecurity  Outreach Attempt:  1st Attempt  A HIPAA compliant voice message was left requesting a return call.  Instructed patient to call back at [phone]. Pt has a Medicaid that [place] is not contracted with, she is too young for MOW in [facility] and I would like to fill out the form for her to get onto MOM's meals when she will call me back.   [place], Care Management Phone: [phone] Email: [email or web address]

## 2020-06-11 NOTE — Patient Outreach (Signed)
Patient due for refills of all medications on 06/14/20, coordinated with [facility] for delivery. Needs Amlodipine, Oxycodone, Methocarbamol, and ASA sent in.  Sent msg's to providers asking for it.

## 2020-06-12 ENCOUNTER — Other Ambulatory Visit: Payer: Self-pay

## 2020-06-12 ENCOUNTER — Ambulatory Visit: Payer: Medicaid Other | Admitting: Physical Therapy

## 2020-06-12 ENCOUNTER — Encounter: Payer: Self-pay | Admitting: Physical Therapy

## 2020-06-12 ENCOUNTER — Telehealth: Payer: Self-pay | Admitting: Internal Medicine

## 2020-06-12 DIAGNOSIS — R262 Difficulty in walking, not elsewhere classified: Secondary | ICD-10-CM

## 2020-06-12 DIAGNOSIS — I10 Essential (primary) hypertension: Secondary | ICD-10-CM

## 2020-06-12 DIAGNOSIS — M6281 Muscle weakness (generalized): Secondary | ICD-10-CM

## 2020-06-12 DIAGNOSIS — R278 Other lack of coordination: Secondary | ICD-10-CM

## 2020-06-12 DIAGNOSIS — M25662 Stiffness of left knee, not elsewhere classified: Secondary | ICD-10-CM

## 2020-06-12 DIAGNOSIS — G8929 Other chronic pain: Secondary | ICD-10-CM

## 2020-06-12 DIAGNOSIS — M25562 Pain in left knee: Secondary | ICD-10-CM | POA: Diagnosis not present

## 2020-06-12 DIAGNOSIS — R601 Generalized edema: Secondary | ICD-10-CM

## 2020-06-12 MED ORDER — ASPIRIN 81 MG PO CHEW: 81.0000 mg | CHEWABLE_TABLET | Freq: Two times a day (BID) | ORAL | 0 refills | Status: DC

## 2020-06-12 MED ORDER — AMLODIPINE BESYLATE 10 MG PO TABS: 10.0000 mg | ORAL_TABLET | Freq: Every day | ORAL | 3 refills | Status: DC

## 2020-06-12 NOTE — Telephone Encounter (Signed)
-----   Message from Lane Hacker, [facility] sent at 06/11/2020  8:30 AM EST ----- Morning! I was wondering if you could send in this patient's refills to [facility] on [address] in [place]?  She is out of her Amlodipine and ASA.  I'm trying to deliver them on the 28th, thanks!!

## 2020-06-12 NOTE — Therapy (Signed)
[facility] [address], [place], [place], [address] Phone: [phone]   Fax:  [phone]  Physical Therapy Treatment  Patient Details  Name: Elizabeth Bush MRN: [id number] Date of Birth: Aug 28, 1970 Referring Provider (PT): [name], MD   Encounter Date: 06/12/2020   PT End of Session - 06/12/20 1224    Visit Number 4    Date for PT Re-Evaluation 08/20/20    Authorization Type [employer]    Authorization Time Period 03/27/2020-05/26/2020    Authorization - Visit Number 2    Authorization - Number of Visits 4    PT Start Time 1226    PT Stop Time 1308    PT Time Calculation (min) 42 min    Activity Tolerance Patient tolerated treatment well;No increased pain    Behavior During Therapy WFL for tasks assessed/performed           Past Medical History:  Diagnosis Date  . Anemia   . Anxiety   . Arthritis   . Chlamydia   . Depression   . Dyspnea    when walkking   . GERD (gastroesophageal reflux disease)   . Gonorrhea   . Hypertension     Past Surgical History:  Procedure Laterality Date  . COLONOSCOPY  05/31/2011   Procedure: COLONOSCOPY;  Surgeon: Landry Dyke, MD;  Location: [facility];  Service: Endoscopy;  Laterality: N/A;  . ESOPHAGOGASTRODUODENOSCOPY  05/30/2011   Procedure: ESOPHAGOGASTRODUODENOSCOPY (EGD);  Surgeon: Landry Dyke, MD;  Location: [facility];  Service: Endoscopy;  Laterality: N/A;  . GIVENS CAPSULE STUDY  06/01/2011   Procedure: GIVENS CAPSULE STUDY;  Surgeon: Landry Dyke, MD;  Location: [facility];  Service: Endoscopy;  Laterality: N/A;  . TONSILLECTOMY    . TOTAL KNEE ARTHROPLASTY Left 05/03/2020   Procedure: LEFT TOTAL KNEE ARTHROPLASTY;  Surgeon: [name], MD;  Location: [facility];  Service: Orthopedics;  Laterality: Left;  . TUBAL LIGATION      There were no vitals filed for this visit.   Subjective Assessment - 06/12/20 1228     Subjective Two days ago pain began to ramp up. Pt ambulating with rolling walker.    Currently in Pain? Yes    Pain Score 8     Pain Location Knee    Pain Orientation Right;Left    Pain Descriptors / Indicators Throbbing;Shooting;Sharp    Aggravating Factors  Just been constant last few days    Pain Relieving Factors pain meds maybe??    Multiple Pain Sites No              [facility] PT Assessment - 06/12/20 0001      PROM   Left Knee Flexion 109                         [facility] Adult PT Treatment/Exercise - 06/12/20 0001      Self-Care   Other Self-Care Comments  Verballed discussed how to use cane, pt doing some at home, pt with excellent demo around clinic during session. [place] for posture.      Lumbar Exercises: Aerobic   Nustep L1 x 10 min with PTA present to monitor      Lumbar Exercises: Machines for Strengthening   Leg Press Seat 9 Sm ROM: Bil 50# 10x      Knee/Hip Exercises: Stretches   Knee: Self-Stretch to increase Flexion Left   using floor slider 2x10  Knee/Hip Exercises: Standing   Heel Raises Both;1 set;10 reps    Hip Extension AROM;Both;1 set;10 reps;Knee straight    Rebounder all 3 ways 1 minute each      Knee/Hip Exercises: Seated   Long Arc Quad AROM;Strengthening;Left;1 set;10 reps   added to HEP     Vasopneumatic   Number Minutes Vasopneumatic  --   declined                 PT Education - 06/12/20 1311    Education Details HEP    Person(s) Educated Patient    Methods Explanation;Demonstration;Verbal cues;Handout    Comprehension Returned demonstration;Verbalized understanding            PT Short Term Goals - 06/12/20 1255      PT SHORT TERM GOAL #1   Title independent with initial HEP    Time 4    Period Weeks    Status Achieved    Target Date 04/25/20             PT Long Term Goals - 05/28/20 1613      PT LONG TERM GOAL #1   Title independent with advanced HEP    Time 12    Period Weeks    Status  New    Target Date 08/20/20      PT LONG TERM GOAL #2   Title knee flexion 120 for ability to negotiate stairs    Baseline 86 deg    Time 12    Period Weeks    Status New    Target Date 08/20/20      PT LONG TERM GOAL #3   Title LEFS improved by at least 9 points for statistically significant funcitonal improvement    Time 12    Period Weeks    Status New    Target Date 08/20/20      PT LONG TERM GOAL #4   Title pt able to go up and down stairs at home to get to the bathroom with only mild pain at most    Baseline reports severe pain 9/10    Time 12    Period Weeks    Status New    Target Date 08/20/20                 Plan - 06/12/20 1231    Clinical Impression Statement Pt arrives with complaints of bilateral knee pain that has been increased over the last few days. Pt reports not liking the Game ready, too cold. PTA verbally encouraged pt to do more walking with her cane to practice in her home and to encourage more compliance with HEP. Pt ambulated all clinic distances with standard cane. Pt will decide which hand to hold it in depending on which knee is either painful or needing more support. Pt's knee flexion measured 108-109 degrees insupine today. Pt rather liked the leg press.    Personal Factors and Comorbidities Age;Fitness;Past/Current Experience;Social Background;Comorbidity 1;Transportation;Finances;Education    Comorbidities s/p knee surgery    Examination-Activity Limitations Locomotion Level;Continence;Dressing;Bed Mobility;Bend;Transfers;Stairs;Stand;Bathing    Examination-Participation Restrictions Cleaning;Community Activity;Shop;Personal Finances    Stability/Clinical Decision Making Evolving/Moderate complexity    Rehab Potential Good    PT Frequency 2x / week    PT Duration 12 weeks    PT Treatment/Interventions ADLs/Self Care Home Management;Biofeedback;Cryotherapy;Electrical Stimulation;Moist Heat;Neuromuscular re-education;Therapeutic  exercise;Therapeutic activities;Gait training;Patient/family education;Manual techniques;Energy conservation;Joint Manipulations;Stair training;Passive range of motion;Dry needling;Taping    PT Next Visit Plan Knee strength and ROm,gait with cane  PT Home Exercise Plan Access Code: [email or web address]    Consulted and Agree with Plan of Care Patient           Patient will benefit from skilled therapeutic intervention in order to improve the following deficits and impairments:  Abnormal gait,Decreased coordination,Increased fascial restricitons,Decreased endurance,Decreased activity tolerance,Pain,Increased muscle spasms,Decreased strength,Postural dysfunction,Decreased range of motion  Visit Diagnosis: Muscle weakness (generalized)  Stiffness of left knee, not elsewhere classified  Chronic pain of left knee  Difficulty in walking, not elsewhere classified  Generalized edema  Other lack of coordination     Problem List Patient Active Problem List   Diagnosis Date Noted  . 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 Unilateral primary osteoarthritis, left knee 05/02/2020  . Light smoker 04/08/2020  . Influenza vaccine needed 02/06/2020  . Dependent for transportation 12/12/2019  . 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 Functional urinary incontinence 12/12/2019  . Rectal prolapse 10/31/2019  . Moderate episode of recurrent major depressive disorder ([place]) 10/31/2019  . Primary osteoarthritis of both knees 10/31/2019  . IDA (iron deficiency anemia) 10/30/2019  . Fibroids 10/30/2019  . Unilateral primary osteoarthritis, right knee 09/27/2019  . Alcohol abuse 11/05/2016  . Essential hypertension 11/05/2016  . Grade III hemorrhoids 11/05/2016  . GIB (gastrointestinal bleeding) 05/29/2011    Aaronmichael Brumbaugh, PTA 06/12/2020, 1:13 PM  [facility] [address] [place], [place], [address] Phone:  [phone]   Fax:  [phone]  Name: BREYON BLASS MRN: [id number] Date of Birth: 1971-03-12  Access Code: [email or web address]: 01/26/2022Prepared by: Anderson Malta CochranExercises  Supine Quad Set - 3 x daily - 7 x weekly - 1 sets - 10 reps - 5 sec hold  Supine Heel Slide - 3 x daily - 7 x weekly - 1 sets - 10 reps  Supine Hip Abduction - 3 x daily - 7 x weekly - 1 sets - 10 reps  Heel rises with counter support - 3 x daily - 7 x weekly - 10 reps  Seated Long Arc Quad - 7 x weekly - 10 reps

## 2020-06-13 ENCOUNTER — Ambulatory Visit (INDEPENDENT_AMBULATORY_CARE_PROVIDER_SITE_OTHER): Payer: Medicaid Other | Admitting: Nurse Practitioner

## 2020-06-13 ENCOUNTER — Encounter: Payer: Self-pay | Admitting: Nurse Practitioner

## 2020-06-13 ENCOUNTER — Ambulatory Visit: Payer: Medicaid Other | Admitting: Physical Therapy

## 2020-06-13 ENCOUNTER — Other Ambulatory Visit (INDEPENDENT_AMBULATORY_CARE_PROVIDER_SITE_OTHER): Payer: Medicaid Other

## 2020-06-13 ENCOUNTER — Other Ambulatory Visit: Payer: Self-pay | Admitting: Orthopaedic Surgery

## 2020-06-13 ENCOUNTER — Telehealth: Payer: Self-pay | Admitting: Internal Medicine

## 2020-06-13 ENCOUNTER — Telehealth: Payer: Self-pay | Admitting: Nurse Practitioner

## 2020-06-13 VITALS — BP 130/70 | HR 101 | Ht 67.0 in | Wt 195.0 lb

## 2020-06-13 DIAGNOSIS — D649 Anemia, unspecified: Secondary | ICD-10-CM | POA: Diagnosis not present

## 2020-06-13 DIAGNOSIS — A048 Other specified bacterial intestinal infections: Secondary | ICD-10-CM

## 2020-06-13 DIAGNOSIS — Z96652 Presence of left artificial knee joint: Secondary | ICD-10-CM

## 2020-06-13 LAB — COMPREHENSIVE METABOLIC PANEL
ALT: 41 U/L — ABNORMAL HIGH (ref 0–35)
AST: 66 U/L — ABNORMAL HIGH (ref 0–37)
Albumin: 4 g/dL (ref 3.5–5.2)
Alkaline Phosphatase: 73 U/L (ref 39–117)
BUN: 10 mg/dL (ref 6–23)
CO2: 26 mEq/L (ref 19–32)
Calcium: 10.1 mg/dL (ref 8.4–10.5)
Chloride: 106 mEq/L (ref 96–112)
Creatinine, Ser: 0.59 mg/dL (ref 0.40–1.20)
GFR: 104.73 mL/min (ref >60.00)
Glucose, Bld: 99 mg/dL (ref 70–99)
Potassium: 4 mEq/L (ref 3.5–5.1)
Sodium: 140 mEq/L (ref 135–145)
Total Bilirubin: 0.5 mg/dL (ref 0.2–1.2)
Total Protein: 8.8 g/dL — ABNORMAL HIGH (ref 6.0–8.3)

## 2020-06-13 LAB — CBC
HCT: 34.9 % — ABNORMAL LOW (ref 36.0–46.0)
Hemoglobin: 12.2 g/dL (ref 12.0–15.0)
MCHC: 35 g/dL (ref 30.0–36.0)
MCV: 97.2 fl (ref 78.0–100.0)
Platelets: 231 10*3/uL (ref 150.0–400.0)
RBC: 3.59 Mil/uL — ABNORMAL LOW (ref 3.87–5.11)
RDW: 16.4 % — ABNORMAL HIGH (ref 11.5–15.5)
WBC: 5.1 10*3/uL (ref 4.0–10.5)

## 2020-06-13 MED ORDER — METHOCARBAMOL 500 MG PO TABS: 500.0000 mg | ORAL_TABLET | Freq: Four times a day (QID) | ORAL | 0 refills | Status: DC | PRN

## 2020-06-13 MED ORDER — OMEPRAZOLE 20 MG PO CPDR: 20.0000 mg | DELAYED_RELEASE_CAPSULE | Freq: Every day | ORAL | 1 refills | Status: DC

## 2020-06-13 MED ORDER — OXYCODONE HCL 5 MG PO TABS: 5.0000 mg | ORAL_TABLET | ORAL | 0 refills | Status: DC | PRN

## 2020-06-13 MED FILL — OMEPRAZOLE 20 MG CAP: 20 | 30 days supply | Qty: 30 | Fill #0

## 2020-06-13 NOTE — Progress Notes (Signed)
06/13/2020 Elizabeth Bush [id number] [birth year]   Chief Complaint: follow up after EGD and colonoscopy   History of Present Illness: Elizabeth Bush is a 50 year old female with a past medical history of alcoholism since age 65, anxiety, depression, arthritis, hypertension, sleep apnea does not use cpap.S/P left total knee replacement  05/03/2020.    She underwent an EGD and colonoscopy by Dr. Bryan Lemma 04/18/2020 due to having anemia, heartburn and rectal bleeding.  The EGD identified peptic duodenitis and H. Pylori gastritis. She was prescribed Omeprazole 20mg  po bid, Pepto bismol (262mg ) 2 tabs qid, Metronidazole 250mg  po qid and Doxycycline 100mg  po bid x 14 days.  The colonoscopy identified for tubular adenomatous polyps which were removed from the sigmoid, descending and ascending colon, diverticulosis to the sigmoid and descending colon, internal hemorrhoids and rectal prolapse.  No evidence of colitis.  She was advised to follow-up with surgeon Dr. Dema Severin regarding her internal hemorrhoids and rectal prolapse and she was advised to repeat a colonoscopy in 3 years.    She presents today for further GI follow-up.  She is a challenging historian. Regarding her prescribed H. Pylori treatment, It is unclear how many days she took the Metronidazole and Doxycycline, possibly for 7 days.  She did not take the Pepto-Bismol or the Omeprazole as prescribed.  She stated she didn't understand why she was taking these medications so she did not complete the full course.  She is status post left knee total arthroplasty surgery on 05/03/2020.  She was prescribed Celebrex 200mg  po QD at the time of discharge 05/06/2020 which she continues to take.  Laboratory studies prior to her discharge 05/05/2020 showed a WBC 15.6.  Hemoglobin 8.0.  Hematocrit 25.2.  MCV 90.  Platelet 218.  She was prescribed ferrous sulfate 325 mg once daily which she is not taking.  She denies having any nausea or vomiting.  She complains  of having daily heartburn.  No dysphagia.  No upper or lower abdominal pain.  No further rectal bleeding.  She is passing a normal formed brown bowel movement daily.  No rectal pain.  She continues to have some left knee pain and she continues with physical therapy.  EGD 04/18/2020: - 2 cm hiatal hernia. - Z-line regular, 39 cm from the incisors. - Gastroesophageal flap valve classified as Hill Grade III (minimal fold, loose to endoscope, hiatal hernia likely). - Gastritis. Biopsied. - Normal examined duodenum. Biopsied.  Colonoscopy 04/18/2020: - Rectal prolapse found on perianal exam. - Four 3 to 4 mm polyps in the sigmoid colon, in the descending colon and in the ascending colon, removed with a cold snare. Resected and retrieved. - Normal mucosa in the entire examined colon. Biopsied. - Diverticulosis in the sigmoid colon and in the descending colon. - Non-bleeding internal hemorrhoids. - The examined portion of the ileum was normal. - Follow up Dr. Dema Severin regarding rectal prolapse and hemorrhoids  - 3 year colonoscopy recall Biopsy Report: 1. Surgical [P], duodenal biopies - DUODENAL MUCOSA WITH CHANGES CONSISTENT WITH PEPTIC DUODENITIS. - NO FEATURES OF CELIAC SPRUE OR GRANULOMAS. 2. Surgical [P], stomach, gastric biopsies - MILDLY ACTIVE CHRONIC HELICOBACTER PYLORI GASTRITIS. - WARTHIN-STARRY POSITIVE FOR HELICOBACTER PYLORI. - NO INTESTINAL METAPLASIA, DYSPLASIA OR CARCINOMA. 3. Surgical [P], right colon biopsies - COLONIC MUCOSA WITH NO SIGNIFICANT HISTOPATHOLOGIC CHANGES. - NO MICROSCOPIC COLITIS, ACTIVE INFLAMMATION OR CHRONIC CHANGES. 4. Surgical [P], colon, ascending, descending, sigmoid, polyps (4) - TUBULAR ADENOMA (4). - NO HIGH GRADE DYSPLASIA OR  CARCINOMA. 5. Surgical [P], left colon biopsies - COLONIC MUCOSA WITH NO SIGNIFICANT HISTOPATHOLOGIC CHANGES. - NO MICROSCOPIC COLITIS, ACTIVE INFLAMMATION OR CHRONIC CHANGES  Current Outpatient Medications on File Prior  to Visit  Medication Sig Dispense Refill  . amLODipine (NORVASC) 10 MG tablet Take 1 tablet (10 mg total) by mouth daily. 90 tablet 3  . aspirin 81 MG chewable tablet Chew 1 tablet (81 mg total) by mouth 2 (two) times daily. 30 tablet 0  . celecoxib (CELEBREX) 200 MG capsule Take 1 capsule (200 mg total) by mouth daily. 30 capsule 3  . Multiple Vitamins-Minerals (MULTIVITAMIN WITH MINERALS) tablet Take 1 tablet by mouth daily.    . [DISCONTINUED] DULoxetine (CYMBALTA) 30 MG capsule Take 1 capsule (30 mg total) by mouth daily. (Patient not taking: Reported on 04/24/2020) 30 capsule 5  . [DISCONTINUED] ferrous sulfate 325 (65 FE) MG tablet Take 1 tablet (325 mg total) by mouth daily with breakfast. (Patient not taking: Reported on 04/24/2020) 100 tablet 3   No current facility-administered medications on file prior to visit.   Allergies  Allergen Reactions  . Dilaudid [Hydromorphone] Anxiety and Other (See Comments)    Patient gets paranoid and has temporary delirium   . Lisinopril Swelling    Swelling of mouth/lips    Current Medications, Allergies, Past Medical History, Past Surgical History, Family History and Social History were reviewed in [employer] record.   Review of Systems:   Constitutional: Negative for fever, sweats, chills or weight loss.  Respiratory: Negative for shortness of breath.   Cardiovascular: Negative for chest pain, palpitations and leg swelling.  Gastrointestinal: See HPI.  Musculoskeletal: + Left knee pain.  Neurological: Negative for dizziness, headaches or paresthesias.    Physical Exam: BP 130/70   Pulse (!) 101   Ht 5\' 7"  (1.702 m)   Wt 195 lb (88.5 kg)   SpO2 98%   BMI 30.54 kg/m  General: 50 year old female in no acute distress. Head: Normocephalic and atraumatic. Eyes: No scleral icterus. Conjunctiva pink . Ears: Normal auditory acuity. Lungs: Clear throughout to auscultation. Heart: Regular rate and rhythm, no  murmur. Abdomen: Soft, nontender and nondistended. No masses or hepatomegaly. Normal bowel sounds x 4 quadrants.  Rectal: Deferred. Musculoskeletal: Left knee with reduced mobility s/p left TKR surgery.  Extremities: No edema. Neurological: Alert oriented x 4. No focal deficits.  Psychological: Alert and cooperative. Normal mood and affect  Assessment and Recommendations:  76.  50 year old female with GERD. S/P EGD 04/18/2020 identified H. pylori gastritis.  She was prescribed Omeprazole 20mg  po bid, Pepto bismol (262mg ) 2 tabs qid, Metronidazole 250mg  po qid and Doxycycline 100mg  po bid x 14 days, however, she took the Metronidazole and Doxycycline possibly for  7 days and she did not take the Omeprazole or Pepto-Bismol. -Omeprazole 20 mg daily to start now due to daily heartburn -I will consult with Dr. Bryan Lemma prior to represcribing treatment for H. pylori as she is having difficulty following instructions for multiple prescription medications -Follow-up in office in 6-week  2.  Internal hemorrhoids and rectal prolapse.  Patient currently denies having any rectal pain or rectal bleeding. -Patient to follow-up with surgeon Dr. Dema Severin previously recommended  3. Colon polyps. Four tubular adenomatous colon polyps were removed at the time of her colonoscopy 04/2020. -Next colonoscopy due 04/2023  4. S/P Left knee total replacement surgery  -Recommend weaning off Celebrex in the near future in setting of H. pylori gastritis at risk for gastric ulcer

## 2020-06-13 NOTE — Telephone Encounter (Signed)
06/13/2020 01:27 PM Phone (Incoming) Bush, Elizabeth M (Self) [phone] (M)   Pt called to inform as instruced after her OV to report on meds/how much left.. Pt states she takes doxycycline 15 pills left.. Metronidazole 30 left.. please advise    By Consuelo Pandy

## 2020-06-13 NOTE — Telephone Encounter (Signed)
Returned pt call. Pt states she is trying to get medicare and she was told that she has to get approval saying she is disable. Pt states she had knee surgery and she is about to have another surgery. Pt states she has medicaid and she is wanting to know how to get Medicare.   Elizabeth Bush are you able to help

## 2020-06-13 NOTE — Telephone Encounter (Signed)
Call returned to patient. She said that she is interested in medicare because she was told that they pay for OTC medications like aspirin and she would like assistance paying for these medications.   This CM explained that regular medicare does not pay for prescription medications or OTC medications. Individuals with straight medicare need to purchase part D/supplement plans for drug coverage. Some medicare advantage plans may offer OTC benefits but she would first need medicare.  He medicaid is currently providing prescription medication coverage.    If she would like information about disability and qualifications for medicare, she needs to call [employer].  She said she has been trying but is not able to get through.  Encouraged her to keep trying.  She said she may just stick with her medicaid

## 2020-06-13 NOTE — Telephone Encounter (Signed)
Copied from [place] [id number]. Topic: General - Inquiry >> Jun 12, 2020  4:47 PM Gillis Ends D wrote: Reason for CRM: Patient would like a call back from Dr. Wynetta Emery. She can be reached at [phone]. Please advise

## 2020-06-13 NOTE — Patient Instructions (Signed)
LABS: Your provider has requested that you go to the basement level for lab work before leaving today. Press "B" on the elevator. The lab is located at the first door on the left as you exit the elevator.  MEDICATION  We have sent the following medication to your pharmacy for you to pick up at your convenience:  Omeprazole 20 Mg once a day.  Please follow up with Dr. Bryan Lemma in 6 weeks.    When you return home call our office to verify how much you have left of:  Doxycycline  Metronidazole  It was great seeing you today!  Thank you for entrusting me with your care and choosing [facility].  Noralyn Pick, CRNP

## 2020-06-17 ENCOUNTER — Ambulatory Visit: Payer: Medicaid Other | Admitting: Physical Therapy

## 2020-06-17 ENCOUNTER — Other Ambulatory Visit: Payer: Self-pay

## 2020-06-17 ENCOUNTER — Encounter: Payer: Self-pay | Admitting: Physical Therapy

## 2020-06-17 ENCOUNTER — Telehealth: Payer: Self-pay | Admitting: Internal Medicine

## 2020-06-17 DIAGNOSIS — G8929 Other chronic pain: Secondary | ICD-10-CM

## 2020-06-17 DIAGNOSIS — M25562 Pain in left knee: Secondary | ICD-10-CM | POA: Diagnosis not present

## 2020-06-17 DIAGNOSIS — R278 Other lack of coordination: Secondary | ICD-10-CM

## 2020-06-17 DIAGNOSIS — R601 Generalized edema: Secondary | ICD-10-CM

## 2020-06-17 DIAGNOSIS — M6281 Muscle weakness (generalized): Secondary | ICD-10-CM | POA: Diagnosis not present

## 2020-06-17 DIAGNOSIS — R262 Difficulty in walking, not elsewhere classified: Secondary | ICD-10-CM | POA: Diagnosis not present

## 2020-06-17 DIAGNOSIS — M25662 Stiffness of left knee, not elsewhere classified: Secondary | ICD-10-CM

## 2020-06-17 NOTE — Telephone Encounter (Signed)
Dr. Bryan Lemma, refer to office visit 1/27. See msg below. She did not take the prescribed H. Pylori regimen. She did not understand your prior prescription instructions. At this point, should I re prescribe the same H. Pylori regimen that you ordered? See msg below which confirms she did not complete the prior Rxs for Doxycycline and Metronidazole.

## 2020-06-17 NOTE — Therapy (Signed)
[facility] [address] [place], [place], [address] Phone: [phone]   Fax:  [phone]  Physical Therapy Treatment  Patient Details  Name: Elizabeth Bush MRN: [id number] Date of Birth: Nov 15, 1970 Referring Provider (PT): [name], MD   Encounter Date: 06/17/2020   PT End of Session - 06/17/20 1021    Visit Number 5    Number of Visits 17    Date for PT Re-Evaluation 08/20/20    Authorization Type [employer]    Authorization Time Period 03/27/2020-05/26/2020    Authorization - Visit Number 4    Authorization - Number of Visits 17    PT Start Time [id number]    PT Stop Time 1101    PT Time Calculation (min) 46 min    Activity Tolerance Patient tolerated treatment well;No increased pain    Behavior During Therapy WFL for tasks assessed/performed           Past Medical History:  Diagnosis Date  . Anemia   . Anxiety   . Arthritis   . Chlamydia   . Depression   . Dyspnea    when walkking   . GERD (gastroesophageal reflux disease)   . Gonorrhea   . Hypertension     Past Surgical History:  Procedure Laterality Date  . COLONOSCOPY  05/31/2011   Procedure: COLONOSCOPY;  Surgeon: Landry Dyke, MD;  Location: [facility];  Service: Endoscopy;  Laterality: N/A;  . ESOPHAGOGASTRODUODENOSCOPY  05/30/2011   Procedure: ESOPHAGOGASTRODUODENOSCOPY (EGD);  Surgeon: Landry Dyke, MD;  Location: [facility];  Service: Endoscopy;  Laterality: N/A;  . GIVENS CAPSULE STUDY  06/01/2011   Procedure: GIVENS CAPSULE STUDY;  Surgeon: Landry Dyke, MD;  Location: [facility];  Service: Endoscopy;  Laterality: N/A;  . TONSILLECTOMY    . TOTAL KNEE ARTHROPLASTY Left 05/03/2020   Procedure: LEFT TOTAL KNEE ARTHROPLASTY;  Surgeon: [name], MD;  Location: [facility];  Service: Orthopedics;  Laterality: Left;  . TUBAL LIGATION      There were no vitals filed for this visit.   Subjective Assessment  - 06/17/20 1022    Subjective Pt ambulates into clinic today with straight cane. Pt reports she "did a lot this weekend, a lot of walking and stores." She reports a lot of soreness with all this activity.    Patient Stated Goals increase knee ROM and strength    Currently in Pain? Yes    Pain Score 8     Pain Location Knee    Pain Orientation Left    Pain Descriptors / Indicators Aching;Sore    Aggravating Factors  Overdoing it    Pain Relieving Factors ice at home    Multiple Pain Sites No              [facility] PT Assessment - 06/17/20 0001      PROM   Left Knee Flexion 110   with encouragement                        [facility] Adult PT Treatment/Exercise - 06/17/20 0001      Self-Care   Other Self-Care Comments  Encouraged pt to keep her LE elevated throughout the day when resting to helpdecrease edema in LE. Also educated pt in ankle pumping. Pt agreed to try.      Lumbar Exercises: Aerobic   Nustep L1 x 10 min with PTA present to monitor  Lumbar Exercises: Machines for Strengthening   Leg Press seat 9: Bil 50# 10x, 55# 10x, LTLE 30# 10x      Knee/Hip Exercises: Standing   Heel Raises Both;2 sets;10 reps   VC not to lunge forward   Rebounder all 3 ways 1 minute each   Pt able to step up with LTLE   Other Standing Knee Exercises SLS on LTLE, pt taps first step 2x10   VC for posture     Knee/Hip Exercises: Seated   Long Arc Quad Strengthening;Left;2 sets;10 reps;Weights    Long Arc Quad Weight 2 lbs.    Ball Squeeze 5 sec hold 10x, then add LAQ 2x10 with 2#    Clamshell with TheraBand Yellow   loop 2x10-15                   PT Short Term Goals - 06/12/20 1255      PT SHORT TERM GOAL #1   Title independent with initial HEP    Time 4    Period Weeks    Status Achieved    Target Date 04/25/20             PT Long Term Goals - 05/28/20 1613      PT LONG TERM GOAL #1   Title independent with advanced HEP    Time 12    Period Weeks     Status New    Target Date 08/20/20      PT LONG TERM GOAL #2   Title knee flexion 120 for ability to negotiate stairs    Baseline 86 deg    Time 12    Period Weeks    Status New    Target Date 08/20/20      PT LONG TERM GOAL #3   Title LEFS improved by at least 9 points for statistically significant funcitonal improvement    Time 12    Period Weeks    Status New    Target Date 08/20/20      PT LONG TERM GOAL #4   Title pt able to go up and down stairs at home to get to the bathroom with only mild pain at most    Baseline reports severe pain 9/10    Time 12    Period Weeks    Status New    Target Date 08/20/20                 Plan - 06/17/20 1030    Clinical Impression Statement Pt arrives with bilateral knee soreness. She reports doing "ok" with her workouts here in the clinic but over the weekend she reports doing a lot of walking which made her knees very sore. Pt is now ambulating with straight cane, she prefers it a little on the higher side. pt achieved 110 degrees of knee flexion at end of session today.    Personal Factors and Comorbidities Age;Fitness;Past/Current Experience;Social Background;Comorbidity 1;Transportation;Finances;Education    Comorbidities s/p knee surgery    Examination-Activity Limitations Locomotion Level;Continence;Dressing;Bed Mobility;Bend;Transfers;Stairs;Stand;Bathing    Examination-Participation Restrictions Cleaning;Community Activity;Shop;Personal Finances    Stability/Clinical Decision Making Evolving/Moderate complexity    Rehab Potential Good    PT Frequency 2x / week    PT Duration 12 weeks    PT Treatment/Interventions ADLs/Self Care Home Management;Biofeedback;Cryotherapy;Electrical Stimulation;Moist Heat;Neuromuscular re-education;Therapeutic exercise;Therapeutic activities;Gait training;Patient/family education;Manual techniques;Energy conservation;Joint Manipulations;Stair training;Passive range of motion;Dry needling;Taping     PT Next Visit Plan Knee strength and ROm,    PT Home Exercise Plan  Access Code: [email or web address]    Consulted and Agree with Plan of Care Patient           Patient will benefit from skilled therapeutic intervention in order to improve the following deficits and impairments:  Abnormal gait,Decreased coordination,Increased fascial restricitons,Decreased endurance,Decreased activity tolerance,Pain,Increased muscle spasms,Decreased strength,Postural dysfunction,Decreased range of motion  Visit Diagnosis: Muscle weakness (generalized)  Stiffness of left knee, not elsewhere classified  Chronic pain of left knee  Difficulty in walking, not elsewhere classified  Generalized edema  Other lack of coordination     Problem List Patient Active Problem List   Diagnosis Date Noted  . 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 Unilateral primary osteoarthritis, left knee 05/02/2020  . Light smoker 04/08/2020  . Influenza vaccine needed 02/06/2020  . Dependent for transportation 12/12/2019  . 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 Functional urinary incontinence 12/12/2019  . Rectal prolapse 10/31/2019  . Moderate episode of recurrent major depressive disorder ([place]) 10/31/2019  . Primary osteoarthritis of both knees 10/31/2019  . IDA (iron deficiency anemia) 10/30/2019  . Fibroids 10/30/2019  . Unilateral primary osteoarthritis, right knee 09/27/2019  . Alcohol abuse 11/05/2016  . Essential hypertension 11/05/2016  . Grade III hemorrhoids 11/05/2016  . GIB (gastrointestinal bleeding) 05/29/2011    Donnisha Besecker, :PTA 06/17/2020, 11:07 AM   [facility] [address], [place], [place], [address] Phone: [phone]   Fax:  [phone]  Name: ANALYSE ANGST MRN: [id number] Date of Birth: [birth year]

## 2020-06-17 NOTE — Telephone Encounter (Signed)
   Telephone encounter was:  Successful.  06/17/2020 Name: JANEIL SCHEXNAYDER MRN: [id number] DOB: 07-05-1970  [name] is a 50 y.o. year old female who is a primary care patient of Ladell Pier, MD . The community resource team was consulted for assistance with [place] guide performed the following interventions: Investigation of community resources performed Follow up call placed to community resources to determine status of patients referral Patient is too young for MOW and when we discussed her applying for MOM's meals she said that she is doing ok for now and would like to reserve this help for when she has her next surgery in June/July time. She will call us back to set it up before the surgery so that when she gets out of surgery she can have everything in place. She understands that the In home care company that I reached out to will not take her insurance and she doesn't have the funds to pay for it out of pocket. .  Follow Up Plan:  Client will call us when she has her next surgery to be placed under care with MOM's meals.  and No further follow up planned at this time. The patient has been provided with needed resources.  [place], Care Management Phone: [phone] Email: [email or web address]

## 2020-06-18 DIAGNOSIS — Z419 Encounter for procedure for purposes other than remedying health state, unspecified: Secondary | ICD-10-CM | POA: Diagnosis not present

## 2020-06-19 NOTE — Progress Notes (Signed)
Agree with the assessment and plan as outlined by Carl Best, NP.  Plan for repeat course of quadruple therapy and subsequent testing for eradication.  Agree with weaning off NSAIDs.  If requiring continued NSAIDs, would also recommend continued PPI for gastric prophylaxis even after completion of H. pylori therapy.   Gerrit Heck, DO, [facility]

## 2020-06-19 NOTE — Telephone Encounter (Signed)
By that count, looks like she took about 1/2 the regimen. I would plan to repeat the same treatment course, x14 days, then test for eradication as previously planned.

## 2020-06-20 ENCOUNTER — Ambulatory Visit: Payer: Medicaid Other | Attending: Surgery | Admitting: Physical Therapy

## 2020-06-20 ENCOUNTER — Ambulatory Visit (HOSPITAL_COMMUNITY)
Admission: RE | Admit: 2020-06-20 | Discharge: 2020-06-20 | Disposition: A | Discharge status: Home / Self Care | Payer: Medicaid Other | Source: Ambulatory Visit | Attending: Orthopaedic Surgery | Admitting: Orthopaedic Surgery

## 2020-06-20 ENCOUNTER — Other Ambulatory Visit: Payer: Self-pay

## 2020-06-20 ENCOUNTER — Encounter: Payer: Self-pay | Admitting: Physical Therapy

## 2020-06-20 ENCOUNTER — Telehealth: Payer: Self-pay | Admitting: Orthopaedic Surgery

## 2020-06-20 ENCOUNTER — Telehealth: Payer: Self-pay

## 2020-06-20 DIAGNOSIS — M7989 Other specified soft tissue disorders: Secondary | ICD-10-CM | POA: Insufficient documentation

## 2020-06-20 DIAGNOSIS — M25662 Stiffness of left knee, not elsewhere classified: Secondary | ICD-10-CM | POA: Diagnosis not present

## 2020-06-20 DIAGNOSIS — R601 Generalized edema: Secondary | ICD-10-CM | POA: Insufficient documentation

## 2020-06-20 DIAGNOSIS — R278 Other lack of coordination: Secondary | ICD-10-CM | POA: Insufficient documentation

## 2020-06-20 DIAGNOSIS — M6281 Muscle weakness (generalized): Secondary | ICD-10-CM | POA: Diagnosis not present

## 2020-06-20 DIAGNOSIS — R262 Difficulty in walking, not elsewhere classified: Secondary | ICD-10-CM | POA: Diagnosis not present

## 2020-06-20 DIAGNOSIS — M25562 Pain in left knee: Secondary | ICD-10-CM | POA: Diagnosis not present

## 2020-06-20 DIAGNOSIS — G8929 Other chronic pain: Secondary | ICD-10-CM | POA: Diagnosis not present

## 2020-06-20 NOTE — Telephone Encounter (Signed)
Scheduled patient today at 11:00 for doppler at [facility] on [address]

## 2020-06-20 NOTE — Telephone Encounter (Signed)
Elizabeth Bush from [facility] called. Patient is negative for DVT. There is an area of mixed echos. possible hematoma. Her call back number is [phone]

## 2020-06-20 NOTE — Telephone Encounter (Signed)
FYI

## 2020-06-20 NOTE — Therapy (Signed)
[facility] [address] [place], [place], [address] Phone: [phone]   Fax:  [phone]  Physical Therapy Treatment  Patient Details  Name: Elizabeth Bush MRN: [id number] Date of Birth: May 22, 1970 Referring Provider (PT): [name], MD   Encounter Date: 06/20/2020   PT End of Session - 06/20/20 1006    Visit Number 6    Number of Visits 17    Date for PT Re-Evaluation 08/20/20    Authorization Type [employer]    Authorization Time Period 05/29/2020-07/27/2020    Authorization - Visit Number 5    Authorization - Number of Visits 17    PT Start Time 0930    PT Stop Time 1000   time was shorten due to having to get a Doppler for possible blood clot   PT Time Calculation (min) 30 min    Activity Tolerance Patient tolerated treatment well;Patient limited by pain    Behavior During Therapy [facility] for tasks assessed/performed           Past Medical History:  Diagnosis Date  . Anemia   . Anxiety   . Arthritis   . Chlamydia   . Depression   . Dyspnea    when walkking   . GERD (gastroesophageal reflux disease)   . Gonorrhea   . Hypertension     Past Surgical History:  Procedure Laterality Date  . COLONOSCOPY  05/31/2011   Procedure: COLONOSCOPY;  Surgeon: Landry Dyke, MD;  Location: [facility];  Service: Endoscopy;  Laterality: N/A;  . ESOPHAGOGASTRODUODENOSCOPY  05/30/2011   Procedure: ESOPHAGOGASTRODUODENOSCOPY (EGD);  Surgeon: Landry Dyke, MD;  Location: [facility];  Service: Endoscopy;  Laterality: N/A;  . GIVENS CAPSULE STUDY  06/01/2011   Procedure: GIVENS CAPSULE STUDY;  Surgeon: Landry Dyke, MD;  Location: [facility];  Service: Endoscopy;  Laterality: N/A;  . TONSILLECTOMY    . TOTAL KNEE ARTHROPLASTY Left 05/03/2020   Procedure: LEFT TOTAL KNEE ARTHROPLASTY;  Surgeon: [name], MD;  Location: [facility];  Service: Orthopedics;  Laterality: Left;  . TUBAL LIGATION       There were no vitals filed for this visit.   Subjective Assessment - 06/20/20 0941    Subjective Patient reports increased swelling of the left leg and not able to sleep due to the pain.    Limitations Walking;Standing    Patient Stated Goals increase knee ROM and strength    Currently in Pain? Yes    Pain Score 9     Pain Location Knee    Pain Orientation Left    Pain Descriptors / Indicators Aching;Sore    Pain Type Surgical pain    Pain Onset More than a month ago    Pain Frequency Intermittent    Aggravating Factors  over doing it    Pain Relieving Factors ice at home              [facility] PT Assessment - 06/20/20 0001      Assessment   Medical Diagnosis Z96.652 (ICD-10-CM) - Presence of left artificial knee joint    Referring Provider (PT) [name], MD    Prior Therapy [facility]      Precautions   Precautions Knee      Restrictions   Weight Bearing Restrictions No      Cognition   Overall Cognitive Status Within Functional Limits for tasks assessed      Circumferential Edema   Circumferential - Left  47.5      Special Tests    Special Tests Swelling Tests    Swelling Tests other      other   Findings --   Homans sigh   Side Left    Comments squeeze calf and there is increased pain                         [facility] Adult PT Treatment/Exercise - 06/20/20 0001      Lumbar Exercises: Aerobic   Nustep L1 x 10 min with PTA present to monitor                    PT Short Term Goals - 06/12/20 1255      PT SHORT TERM GOAL #1   Title independent with initial HEP    Time 4    Period Weeks    Status Achieved    Target Date 04/25/20             PT Long Term Goals - 05/28/20 1613      PT LONG TERM GOAL #1   Title independent with advanced HEP    Time 12    Period Weeks    Status New    Target Date 08/20/20      PT LONG TERM GOAL #2   Title knee flexion 120 for ability to negotiate stairs    Baseline 86 deg     Time 12    Period Weeks    Status New    Target Date 08/20/20      PT LONG TERM GOAL #3   Title LEFS improved by at least 9 points for statistically significant funcitonal improvement    Time 12    Period Weeks    Status New    Target Date 08/20/20      PT LONG TERM GOAL #4   Title pt able to go up and down stairs at home to get to the bathroom with only mild pain at most    Baseline reports severe pain 9/10    Time 12    Period Weeks    Status New    Target Date 08/20/20                 Plan - 06/20/20 1006    Clinical Impression Statement Patient came into therapy today with increased swelling and 9/10 pain in the left leg. Her left knee circumference increased to 47.5 cm and at eval is 46 cm. When therapist squeezed the left calf there was increased in pain. Patient had difficulty with putting weight on her left leg due to pain. Therapist called Dr. Adela Ports office and she has an appointment for a doppler at 11:00 today. Patient was able to do the nustep but not able to handle any other exercise today. We will wait for results to see the next step for therapy.    Personal Factors and Comorbidities Age;Fitness;Past/Current Experience;Social Background;Comorbidity 1;Transportation;Finances;Education    Comorbidities s/p knee surgery    Examination-Activity Limitations Locomotion Level;Continence;Dressing;Bed Mobility;Bend;Transfers;Stairs;Stand;Bathing    Examination-Participation Restrictions Cleaning;Community Activity;Shop;Personal Finances    Stability/Clinical Decision Making Evolving/Moderate complexity    Rehab Potential Good    PT Frequency 2x / week    PT Duration 12 weeks    PT Treatment/Interventions ADLs/Self Care Home Management;Biofeedback;Cryotherapy;Electrical Stimulation;Moist Heat;Neuromuscular re-education;Therapeutic exercise;Therapeutic activities;Gait training;Patient/family education;Manual techniques;Energy conservation;Joint Manipulations;Stair  training;Passive range of motion;Dry needling;Taping    PT Next Visit Plan see what  the Doppler test shows and determine how to progress therapy    PT Home Exercise Plan Access Code: [email or web address]    Consulted and Agree with Plan of Care Patient           Patient will benefit from skilled therapeutic intervention in order to improve the following deficits and impairments:  Abnormal gait,Decreased coordination,Increased fascial restricitons,Decreased endurance,Decreased activity tolerance,Pain,Increased muscle spasms,Decreased strength,Postural dysfunction,Decreased range of motion  Visit Diagnosis: Muscle weakness (generalized)  Stiffness of left knee, not elsewhere classified  Chronic pain of left knee  Difficulty in walking, not elsewhere classified  Generalized edema     Problem List Patient Active Problem List   Diagnosis Date Noted  . 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 Unilateral primary osteoarthritis, left knee 05/02/2020  . Light smoker 04/08/2020  . Influenza vaccine needed 02/06/2020  . Dependent for transportation 12/12/2019  . 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 Functional urinary incontinence 12/12/2019  . Rectal prolapse 10/31/2019  . Moderate episode of recurrent major depressive disorder ([place]) 10/31/2019  . Primary osteoarthritis of both knees 10/31/2019  . IDA (iron deficiency anemia) 10/30/2019  . Fibroids 10/30/2019  . Unilateral primary osteoarthritis, right knee 09/27/2019  . Alcohol abuse 11/05/2016  . Essential hypertension 11/05/2016  . Grade III hemorrhoids 11/05/2016  . GIB (gastrointestinal bleeding) 05/29/2011    Earlie Counts, PT 06/20/20 10:12 AM   [facility] [address], [place], [place], [address] Phone: [phone]   Fax:  [phone]  Name: ANAYIAH DUBA MRN: [id number] Date of Birth: Nov 13, 1970

## 2020-06-21 ENCOUNTER — Telehealth: Payer: Self-pay | Admitting: Internal Medicine

## 2020-06-21 DIAGNOSIS — R32 Unspecified urinary incontinence: Secondary | ICD-10-CM | POA: Diagnosis not present

## 2020-06-21 NOTE — Telephone Encounter (Signed)
Copied from [place] [id number]. Topic: General - Other >> Jun 21, 2020  1:28 PM [name], Brayton Layman A wrote: Reason for CRM: PT would like a call back from [place] since she is unsure how to fill out paperwork for financial assistance along with disability determination services.

## 2020-06-24 ENCOUNTER — Telehealth: Payer: Self-pay | Admitting: Internal Medicine

## 2020-06-24 ENCOUNTER — Encounter: Payer: Medicaid Other | Admitting: Physical Therapy

## 2020-06-24 ENCOUNTER — Telehealth: Payer: Self-pay | Admitting: Orthopaedic Surgery

## 2020-06-24 ENCOUNTER — Other Ambulatory Visit: Payer: Self-pay | Admitting: Nurse Practitioner

## 2020-06-24 DIAGNOSIS — Z0100 Encounter for examination of eyes and vision without abnormal findings: Secondary | ICD-10-CM

## 2020-06-24 DIAGNOSIS — Z131 Encounter for screening for diabetes mellitus: Secondary | ICD-10-CM

## 2020-06-24 DIAGNOSIS — A048 Other specified bacterial intestinal infections: Secondary | ICD-10-CM

## 2020-06-24 MED ORDER — METRONIDAZOLE 250 MG PO TABS: 250.0000 mg | ORAL_TABLET | Freq: Four times a day (QID) | ORAL | 0 refills | Status: DC

## 2020-06-24 MED ORDER — BISMUTH SUBSALICYLATE 262 MG PO CHEW: CHEWABLE_TABLET | ORAL | 0 refills | Status: DC

## 2020-06-24 MED ORDER — OMEPRAZOLE 20 MG PO CPDR: 20.0000 mg | DELAYED_RELEASE_CAPSULE | Freq: Two times a day (BID) | ORAL | 0 refills | Status: DC

## 2020-06-24 MED ORDER — DOXYCYCLINE HYCLATE 100 MG PO TABS: 100.0000 mg | ORAL_TABLET | Freq: Two times a day (BID) | ORAL | 0 refills | Status: DC

## 2020-06-24 MED FILL — DOXYCYCLINE HYCLATE 100 MG: 100 | 14 days supply | Qty: 28 | Fill #0

## 2020-06-24 MED FILL — OMEPRAZOLE 20 MG CAP: 20 | 14 days supply | Qty: 28 | Fill #0

## 2020-06-24 MED FILL — metroNIDAZOLE 250 MG TABS: 250 | 14 days supply | Qty: 56 | Fill #0

## 2020-06-24 NOTE — Telephone Encounter (Signed)
Pt called back asking if something can be sent in to help elevate her leg above her heart.  Please advise

## 2020-06-24 NOTE — Telephone Encounter (Signed)
Elizabeth Bush, I called the patient and I discussed Dr. Bryan Lemma recommended for her to repeat the same quadruple therapy for H. Pylori infection which he previously prescribed because she did not complete the full course. I explained what each of these medications were for. I spelled out all of these instructions so she could write down. She is aware no alcohol while taking these medications, alcohol use can result in liver toxicity while taking Metronidazole.   Please send RX for H. Pylori tx as follows:  1) Omeprazole 20 mg 2 times a day x 14 d. # 28.  2) Pepto Bismol 2 tabs (262 mg each) 4 times a day x 14 d. # 112 tabs.  Patient will purchase otc if her insurance doesn't cover it.  3) Metronidazole 250 mg 4 times a day x 14 d. # 56.  4) Doxycycline 100 mg 2 times a day x 14 d. # 28.    After 14 days, ok to stop omeprazole.   4 weeks after treatment completed, check H. Pylori stool antigen to confirm eradication (must be off acid suppression therapy) Also, repeat hepatic panel 4 weeks after she completes tx too. Thx

## 2020-06-24 NOTE — Telephone Encounter (Signed)
Sent her for doppler last Thursday It was negative

## 2020-06-24 NOTE — Telephone Encounter (Signed)
Copied from [place] [id number]. Topic: General - Other >> Jun 24, 2020  3:52 PM Keene Breath wrote: Reason for CRM: Patient called to ask the doctor for a referral to an eye specialist.  Please confirm and call patient once referral has been ordered at [phone]

## 2020-06-24 NOTE — Telephone Encounter (Signed)
Patient aware of the below message  

## 2020-06-24 NOTE — Telephone Encounter (Signed)
She needs to continue ice and elevation of the right lower extremity multiple times during the day.  She also needs to go by medical supply store and get compressive hose to wear and that certainly can help reduce her swelling.  There is nothing else that we can recommend for this.

## 2020-06-24 NOTE — Telephone Encounter (Signed)
Pt called and states that now her right leg is starting to swell now and her feet are so swollen that she can't wear shoes. She needs to know what she should do CB [phone]

## 2020-06-24 NOTE — Telephone Encounter (Signed)
Medication has been sent to pharmacy. Orders placed for repeat labs  4 weeks after therapy.

## 2020-06-24 NOTE — Telephone Encounter (Signed)
I return Pt call, Pt inform me that she has insurance Medicaid, unable to help at this time

## 2020-06-24 NOTE — Telephone Encounter (Signed)
Patient aware she was NEGATIVE for a DVT and to just elevate that leg and ice if needed

## 2020-06-24 NOTE — Telephone Encounter (Addendum)
Will forward to provider  

## 2020-06-24 NOTE — Telephone Encounter (Signed)
Patient called. She would like the results of her test from the vascular center. Her call back number is [phone]

## 2020-06-24 NOTE — Addendum Note (Signed)
Addended by: Karle Plumber B on: 06/24/2020 06:06 PM   Modules accepted: Orders

## 2020-06-27 ENCOUNTER — Ambulatory Visit: Payer: Medicaid Other | Admitting: Physical Therapy

## 2020-07-01 ENCOUNTER — Other Ambulatory Visit: Payer: Self-pay

## 2020-07-01 ENCOUNTER — Ambulatory Visit: Payer: Medicaid Other | Admitting: Physical Therapy

## 2020-07-01 ENCOUNTER — Encounter: Payer: Self-pay | Admitting: Physical Therapy

## 2020-07-01 DIAGNOSIS — G8929 Other chronic pain: Secondary | ICD-10-CM

## 2020-07-01 DIAGNOSIS — M25662 Stiffness of left knee, not elsewhere classified: Secondary | ICD-10-CM | POA: Diagnosis not present

## 2020-07-01 DIAGNOSIS — R262 Difficulty in walking, not elsewhere classified: Secondary | ICD-10-CM | POA: Diagnosis not present

## 2020-07-01 DIAGNOSIS — R601 Generalized edema: Secondary | ICD-10-CM | POA: Diagnosis not present

## 2020-07-01 DIAGNOSIS — R278 Other lack of coordination: Secondary | ICD-10-CM | POA: Diagnosis not present

## 2020-07-01 DIAGNOSIS — M6281 Muscle weakness (generalized): Secondary | ICD-10-CM

## 2020-07-01 DIAGNOSIS — M25562 Pain in left knee: Secondary | ICD-10-CM

## 2020-07-01 NOTE — Progress Notes (Incomplete)
HEMATOLOGY/ONCOLOGY CONSULTATION NOTE  Date of Service: 07/01/2020  Patient Care Team: Ladell Pier, MD as PCP - General (Internal Medicine) [name], RN as Case Manager Lane Hacker, [facility] as Pharmacist (Pharmacist)  CHIEF COMPLAINTS/PURPOSE OF CONSULTATION:  IDA  HISTORY OF PRESENTING ILLNESS:   Elizabeth Bush is a wonderful 50 y.o. female who has been referred to [place] by Dr. Wynetta Emery for evaluation and management of iron deficiency anemia. The pt reports that she is doing well overall.  The pt reports that she is currently following with Gastroenterology, who is conducting a GI workup for her ongoing rectal bleeding. She has Colonoscopy and Endoscopy scheduled for 04/18/20. Pt is following with Surgery for hemorrhoids and rectal prolapse. Pt is still experiencing rectal bleeding about three times per week. She denies black stools, but is able to visualize dark red clots in her stool. She notes more rectal bleeding when she eats more food. Pt has been on 200 mg Celebrex daily to address her knee pain for nearly two months. She was placed on PO Iron a few months ago. Pt was told to take Ferrous Sulfate twice per day, but has been taking it once every few days. Pt denies any abdominal discomfort or constipation with PO Iron.   She has a Total Knee Arthroplasty scheduled for 05/03/20. 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    She has no known food or medication allergies.   Most recent lab results (02/28/2020) of CBC is as follows: all values are WNL except for RBC at 3.59, Hgb at 9.2, HCT at 28.3, RDW at 17.9. 10/20/2019 Iron at 22, Transferrin at 334.0, Sat Ratios at 4.7, Ferritin at 25.3.  On review of systems, pt reports lower abdominal pain, bloody stools, joint pain and denies fevers, chills, night sweats, unexpected weight loss, nose bleeds, gum bleeds, melena and any other symptoms.   On PMHx the pt reports Arthritis, Hypertension. On Social Hx the pt reports  that she is currently drinking 80 oz of beer per day. She has a history of heavier alcohol use.    INTERVAL HISTORY Elizabeth Bush is a wonderful 50 y.o. female who is here for today for evaluation and management of iron deficiency anemia. The patient's last visit with [place] was on 04/01/2020. The pt reports that she is doing well overall.  The pt reports ***  Lab results today 07/02/2020 of CBC w/diff and CMP is as follows: all values are WNL except for ***  On review of systems, pt reports *** and denies *** and any other symptoms.  MEDICAL HISTORY:  Past Medical History:  Diagnosis Date  . Anemia   . Anxiety   . Arthritis   . Chlamydia   . Depression   . Dyspnea    when walkking   . GERD (gastroesophageal reflux disease)   . Gonorrhea   . Hypertension     SURGICAL HISTORY: Past Surgical History:  Procedure Laterality Date  . COLONOSCOPY  05/31/2011   Procedure: COLONOSCOPY;  Surgeon: Landry Dyke, MD;  Location: [facility];  Service: Endoscopy;  Laterality: N/A;  . ESOPHAGOGASTRODUODENOSCOPY  05/30/2011   Procedure: ESOPHAGOGASTRODUODENOSCOPY (EGD);  Surgeon: Landry Dyke, MD;  Location: [facility];  Service: Endoscopy;  Laterality: N/A;  . GIVENS CAPSULE STUDY  06/01/2011   Procedure: GIVENS CAPSULE STUDY;  Surgeon: Landry Dyke, MD;  [facility]: [facility];  Service: Endoscopy;  Laterality: N/A;  . TONSILLECTOMY    . TOTAL KNEE ARTHROPLASTY  Left 05/03/2020   Procedure: LEFT TOTAL KNEE ARTHROPLASTY;  Surgeon: [name], MD;  [facility];  Service: Orthopedics;  Laterality: Left;  . TUBAL LIGATION      SOCIAL HISTORY: Social History   Socioeconomic History  . Marital status: Single    Spouse name: Not on file  . Number of children: Not on file  . Years of education: Not on file  . Highest education level: Not on file  Occupational History  . Not on file  Tobacco Use  . Smoking status: Current Some Day Smoker    Packs/day: 0.25     Years: 5.00    Pack years: 1.25    Types: Cigarettes    Last attempt to quit: 11/04/2016    Years since quitting: 3.6  . Smokeless tobacco: Never Used  Vaping Use  . Vaping Use: Never used  Substance and Sexual Activity  . Alcohol use: Yes    Alcohol/week: 6.0 standard drinks    Types: 6 Cans of beer per week    Comment: 2 beers daily   . Drug use: No  . Sexual activity: Yes    Birth control/protection: Other-see comments, Surgical  Other Topics Concern  . Not on file  Social History Narrative  . Not on file   Social Determinants of Health   Financial Resource Strain: Not on file  Food Insecurity: Food Insecurity Present  . Worried About Charity fundraiser in the Last Year: Often true  . Ran Out of Food in the Last Year: Often true  Transportation Needs: Unmet Transportation Needs  . Lack of Transportation (Medical): Yes  . Lack of Transportation (Non-Medical): Yes  Physical Activity: Not on file  Stress: Not on file  Social Connections: Not on file  Intimate Partner Violence: Not on file    FAMILY HISTORY: Family History  Problem Relation Age of Onset  . Diabetes Mother   . Lung cancer Mother   . Stroke Mother   . Heart disease Mother   . Hypertension Other   . Asthma Other   . Stroke Father   . Heart disease Father   . Colon cancer Neg Hx   . Esophageal cancer Neg Hx   . Stomach cancer Neg Hx     ALLERGIES:  is allergic to dilaudid [hydromorphone] and lisinopril.  MEDICATIONS:  Current Outpatient Medications  Medication Sig Dispense Refill  . amLODipine (NORVASC) 10 MG tablet Take 1 tablet (10 mg total) by mouth daily. 90 tablet 3  . aspirin 81 MG chewable tablet Chew 1 tablet (81 mg total) by mouth 2 (two) times daily. 30 tablet 0  . bismuth subsalicylate (PEPTO-BISMOL) 262 MG chewable tablet Take 2 tabs 4 times daily for 14 days 112 tablet 0  . celecoxib (CELEBREX) 200 MG capsule Take 1 capsule (200 mg total) by mouth daily. 30 capsule 3  . doxycycline  (VIBRA-TABS) 100 MG tablet Take 1 tablet (100 mg total) by mouth 2 (two) times daily for 14 days. 28 tablet 0  . methocarbamol (ROBAXIN) 500 MG tablet Take 1 tablet (500 mg total) by mouth every 6 (six) hours as needed for muscle spasms. 40 tablet 0  . metroNIDAZOLE (FLAGYL) 250 MG tablet Take 1 tablet (250 mg total) by mouth 4 (four) times daily for 14 days. 56 tablet 0  . Multiple Vitamins-Minerals (MULTIVITAMIN WITH MINERALS) tablet Take 1 tablet by mouth daily.    Marland Kitchen omeprazole (PRILOSEC) 20 MG capsule Take 1 capsule (20 mg total) by  mouth daily. 30 capsule 1  . omeprazole (PRILOSEC) 20 MG capsule Take 1 capsule (20 mg total) by mouth 2 (two) times daily before a meal for 14 days. 28 capsule 0  . oxyCODONE (OXY IR/ROXICODONE) 5 MG immediate release tablet Take 1-2 tablets (5-10 mg total) by mouth every 4 (four) hours as needed for moderate pain (pain score 4-6). 30 tablet 0   No current facility-administered medications for this visit.    REVIEW OF SYSTEMS:   10 Point review of Systems was done is negative except as noted above.  PHYSICAL EXAMINATION: ECOG PERFORMANCE STATUS: 2 - Symptomatic, <50% confined to bed  . There were no vitals filed for this visit. There were no vitals filed for this visit. .There is no height or weight on file to calculate BMI.  *** GENERAL:alert, in no acute distress and comfortable SKIN: no acute rashes, no significant lesions EYES: conjunctiva are pink and non-injected, sclera anicteric OROPHARYNX: MMM, no exudates, no oropharyngeal erythema or ulceration NECK: supple, no JVD LYMPH:  no palpable lymphadenopathy in the cervical, axillary or inguinal regions LUNGS: clear to auscultation b/l with normal respiratory effort HEART: regular rate & rhythm ABDOMEN:  normoactive bowel sounds , non tender, not distended. Extremity: no pedal edema PSYCH: alert & oriented x 3 with fluent speech NEURO: no focal motor/sensory deficits  LABORATORY DATA:  I have  reviewed the data as listed  . CBC Latest Ref Rng & Units 06/13/2020 05/05/2020 05/04/2020  WBC 4.0 - 10.5 K/uL 5.1 15.6(H) 15.2(H)  Hemoglobin 12.0 - 15.0 g/dL 12.2 8.0(L) 8.8(L)  Hematocrit 36.0 - 46.0 % 34.9(L) 25.2(L) 28.3(L)  Platelets 150.0 - 400.0 K/uL 231.0 218 226    . CMP Latest Ref Rng & Units 06/13/2020 05/04/2020 04/30/2020  Glucose 70 - 99 mg/dL 99 132(H) 96  BUN 6 - 23 mg/dL 10 8 9   Creatinine 0.40 - 1.20 mg/dL 0.59 0.78 0.57  Sodium 135 - 145 mEq/L 140 133(L) 140  Potassium 3.5 - 5.1 mEq/L 4.0 2.9(L) 3.8  Chloride 96 - 112 mEq/L 106 100 107  CO2 19 - 32 mEq/L 26 21(L) 24  Calcium 8.4 - 10.5 mg/dL 10.1 8.5(L) 8.9  Total Protein 6.0 - 8.3 g/dL 8.8(H) - 8.3(H)  Total Bilirubin 0.2 - 1.2 mg/dL 0.5 - 0.4  Alkaline Phos 39 - 117 U/L 73 - 110  AST 0 - 37 U/L 66(H) - 88(H)  ALT 0 - 35 U/L 41(H) - 37     RADIOGRAPHIC STUDIES: I have personally reviewed the radiological images as listed and agreed with the findings in the report. VAS [place] LOWER EXTREMITY VENOUS (DVT)  Result Date: 06/21/2020  Lower Venous DVT Study Indications: Pain, and Swelling.  Risk Factors: Surgery Left TKR 05/03/20. Limitations: Edema. Comparison Study: none Performing Technologist: Ralene Cork RVT  Examination Guidelines: A complete evaluation includes B-mode imaging, spectral Doppler, color Doppler, and power Doppler as needed of all accessible portions of each vessel. Bilateral testing is considered an integral part of a complete examination. Limited examinations for reoccurring indications may be performed as noted. The reflux portion of the exam is performed with the patient in reverse Trendelenburg.  +---------+---------------+---------+-----------+----------+--------------+ LEFT     CompressibilityPhasicitySpontaneityPropertiesThrombus Aging +---------+---------------+---------+-----------+----------+--------------+ CFV      Full           Yes      Yes                                  +---------+---------------+---------+-----------+----------+--------------+  SFJ      Full           Yes      Yes                                 +---------+---------------+---------+-----------+----------+--------------+ FV Prox  Full           Yes      Yes                                 +---------+---------------+---------+-----------+----------+--------------+ FV Mid   Full           Yes      Yes                                 +---------+---------------+---------+-----------+----------+--------------+ FV DistalFull                                                        +---------+---------------+---------+-----------+----------+--------------+ POP      Full           Yes      Yes                                 +---------+---------------+---------+-----------+----------+--------------+ PTV      Full                    Yes                                 +---------+---------------+---------+-----------+----------+--------------+ PERO     Full                    Yes                                 +---------+---------------+---------+-----------+----------+--------------+ GSV      Full           Yes      Yes                                 +---------+---------------+---------+-----------+----------+--------------+  Findings reported to Office closed for lunch. Will retry after 12:30. Kisha at 3:40pm.  Summary: LEFT: - There is no evidence of deep vein thrombosis in the lower extremity. Limited visualization of the calf veins due to edema. - There is no evidence of superficial venous thrombosis.  - Area of mixed echoes in the popliteal fossa, possible hematoma.  *See table(s) above for measurements and observations. Electronically signed by Servando Snare MD on 06/21/2020 at 12:00:41 PM.    Final     ASSESSMENT & PLAN:   50 yo with   1) Iron deficiency Anemia likely due to chronic GI bleeding.  PLAN: -Discussed patient's most recent labs from 07/02/2020;  ***  -Pt continues to drink excessively. Recommend complete alcohol cessation. -Will see back in ***   FOLLOW UP: ***   All of  the patients questions were answered with apparent satisfaction. The patient knows to call the clinic with any problems, questions or concerns.  The total time spent in the appointment was *** minutes and more than 50% was on counseling and direct patient cares.    Sullivan Lone MD [place] AAHIVMS [facility] Hematology/Oncology Physician [facility]  (Office):       [phone] (Work cell):  [phone] (Fax):           [phone]  07/01/2020 12:12 PM  I, Reinaldo Raddle, am acting as scribe for Dr. Sullivan Lone, MD.

## 2020-07-01 NOTE — Therapy (Signed)
[facility] [address], [place], [place], [address] Phone: [phone]   Fax:  [phone]  Physical Therapy Treatment  Patient Details  Name: Elizabeth Bush MRN: [id number] Date of Birth: 06-Nov-1970 Referring Provider (PT): [name], MD   Encounter Date: 07/01/2020   PT End of Session - 07/01/20 1210    Visit Number 7    Number of Visits 17    Date for PT Re-Evaluation 08/20/20    Authorization Type [employer]    Authorization Time Period 05/29/2020-07/27/2020    Authorization - Number of Visits 17    PT Start Time 1147    PT Stop Time 1223    PT Time Calculation (min) 36 min    Activity Tolerance Patient limited by lethargy;Patient limited by pain    Behavior During Therapy Anxious           Past Medical History:  Diagnosis Date  . Anemia   . Anxiety   . Arthritis   . Chlamydia   . Depression   . Dyspnea    when walkking   . GERD (gastroesophageal reflux disease)   . Gonorrhea   . Hypertension     Past Surgical History:  Procedure Laterality Date  . COLONOSCOPY  05/31/2011   Procedure: COLONOSCOPY;  Surgeon: Landry Dyke, MD;  [facility]: [facility];  Service: Endoscopy;  Laterality: N/A;  . ESOPHAGOGASTRODUODENOSCOPY  05/30/2011   Procedure: ESOPHAGOGASTRODUODENOSCOPY (EGD);  Surgeon: Landry Dyke, MD;  Location: [facility];  Service: Endoscopy;  Laterality: N/A;  . GIVENS CAPSULE STUDY  06/01/2011   Procedure: GIVENS CAPSULE STUDY;  Surgeon: Landry Dyke, MD;  [facility]: [facility];  Service: Endoscopy;  Laterality: N/A;  . TONSILLECTOMY    . TOTAL KNEE ARTHROPLASTY Left 05/03/2020   Procedure: LEFT TOTAL KNEE ARTHROPLASTY;  Surgeon: [name], MD;  Location: [facility];  Service: Orthopedics;  Laterality: Left;  . TUBAL LIGATION      There were no vitals filed for this visit.   Subjective Assessment - 07/01/20 1150    Subjective Pt doppler negative for  blood clot. Pt has edema in bil ankles. Pt reports pain in both legs; hips and knees, have been very high for at least a week or 2.    Patient Stated Goals increase knee ROM and strength    Currently in Pain? Yes   Legs inc bil knees 7 hips >10/10   Pain Descriptors / Indicators Tender;Throbbing;Nagging    Aggravating Factors  Constant    Pain Relieving Factors Some ice    Multiple Pain Sites No                             [facility] Adult PT Treatment/Exercise - 07/01/20 0001      Ambulation/Gait   Gait Comments With standard cane: worked on upright posture.      Knee/Hip Exercises: Aerobic   Nustep L1 x 12 min with PTA present to monitor pt reposnse      Knee/Hip Exercises: Supine   Heel Slides AAROM;Left;2 sets;10 reps    Heel Slides Limitations foot on heel slider    Straight Leg Raises AROM;Strengthening;Left;1 set;10 reps    Other Supine Knee/Hip Exercises Ankle pumps elevated 20x    Other Supine Knee/Hip Exercises Ball squeezes in supine 10x2 LE on blue pad  PT Short Term Goals - 06/12/20 1255      PT SHORT TERM GOAL #1   Title independent with initial HEP    Time 4    Period Weeks    Status Achieved    Target Date 04/25/20             PT Long Term Goals - 05/28/20 1613      PT LONG TERM GOAL #1   Title independent with advanced HEP    Time 12    Period Weeks    Status New    Target Date 08/20/20      PT LONG TERM GOAL #2   Title knee flexion 120 for ability to negotiate stairs    Baseline 86 deg    Time 12    Period Weeks    Status New    Target Date 08/20/20      PT LONG TERM GOAL #3   Title LEFS improved by at least 9 points for statistically significant funcitonal improvement    Time 12    Period Weeks    Status New    Target Date 08/20/20      PT LONG TERM GOAL #4   Title pt able to go up and down stairs at home to get to the bathroom with only mild pain at most    Baseline reports severe pain 9/10     Time 12    Period Weeks    Status New    Target Date 08/20/20                 Plan - 07/01/20 1204    Clinical Impression Statement Pt does not have a blood clot per doppler. Pt ambulates into clinic using standard cane. Pt significantly flexed at trunk and side bent to the right. She reports her "legs on both sides and her back are killing her." She reports she does not take the medication because it does not help. Pt has edema in bil ankles. General mobility including getting on & off the mat table is difficult. Pt visually appears super stiff in her body versus guarding her movements due to pain. PTA encouraged pt to perform her ankle pumps as often she can during the day and do her heel slides to strtech her knee out. PTA kept pt on nustep for extended time as this was the activity she was most comfortable with.    Personal Factors and Comorbidities Age;Fitness;Past/Current Experience;Social Background;Comorbidity 1;Transportation;Finances;Education    Comorbidities s/p knee surgery    Examination-Activity Limitations Locomotion Level;Continence;Dressing;Bed Mobility;Bend;Transfers;Stairs;Stand;Bathing    Examination-Participation Restrictions Cleaning;Community Activity;Shop;Personal Finances    Stability/Clinical Decision Making Evolving/Moderate complexity    Rehab Potential Good    PT Frequency 2x / week    PT Duration 12 weeks    PT Treatment/Interventions ADLs/Self Care Home Management;Biofeedback;Cryotherapy;Electrical Stimulation;Moist Heat;Neuromuscular re-education;Therapeutic exercise;Therapeutic activities;Gait training;Patient/family education;Manual techniques;Energy conservation;Joint Manipulations;Stair training;Passive range of motion;Dry needling;Taping    PT Next Visit Plan Encourage pt to trust her knee in standing more, ROM to LE    PT Home Exercise Plan Access Code: [email or web address]    Consulted and Agree with Plan of Care Patient           Patient will benefit  from skilled therapeutic intervention in order to improve the following deficits and impairments:  Abnormal gait,Decreased coordination,Increased fascial restricitons,Decreased endurance,Decreased activity tolerance,Pain,Increased muscle spasms,Decreased strength,Postural dysfunction,Decreased range of motion  Visit Diagnosis: Muscle weakness (generalized)  Stiffness of left knee, not elsewhere classified  Chronic pain of left knee  Difficulty in walking, not elsewhere classified  Other lack of coordination     Problem List Patient Active Problem List   Diagnosis Date Noted  . 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 Unilateral primary osteoarthritis, left knee 05/02/2020  . Light smoker 04/08/2020  . Influenza vaccine needed 02/06/2020  . Dependent for transportation 12/12/2019  . 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 Functional urinary incontinence 12/12/2019  . Rectal prolapse 10/31/2019  . Moderate episode of recurrent major depressive disorder ([place]) 10/31/2019  . Primary osteoarthritis of both knees 10/31/2019  . IDA (iron deficiency anemia) 10/30/2019  . Fibroids 10/30/2019  . Unilateral primary osteoarthritis, right knee 09/27/2019  . Alcohol abuse 11/05/2016  . Essential hypertension 11/05/2016  . Grade III hemorrhoids 11/05/2016  . GIB (gastrointestinal bleeding) 05/29/2011    Herron Fero, PTA 07/01/2020, 12:24 PM  [facility] [address] [place], [place], [address] Phone: [phone]   Fax:  [phone]  Name: SHARNI NEGRON MRN: [id number] Date of Birth: 07-26-1970

## 2020-07-02 ENCOUNTER — Inpatient Hospital Stay: Payer: Medicaid Other | Attending: Hematology and Oncology | Admitting: Hematology

## 2020-07-02 ENCOUNTER — Inpatient Hospital Stay: Payer: Medicaid Other

## 2020-07-02 ENCOUNTER — Other Ambulatory Visit: Payer: Self-pay | Admitting: *Deleted

## 2020-07-02 MED FILL — OMEPRAZOLE 20 MG CAP: 20 | 14 days supply | Qty: 28 | Fill #0

## 2020-07-02 MED FILL — metroNIDAZOLE 250 MG TABS: 250 | 14 days supply | Qty: 56 | Fill #0

## 2020-07-02 MED FILL — DOXYCYCLINE HYCLATE 100 MG: 100 | 14 days supply | Qty: 28 | Fill #0

## 2020-07-02 NOTE — Patient Outreach (Signed)
Care Coordination  07/02/2020  Elizabeth Bush 1971/02/02 [id number]    Medicaid Managed Care   Unsuccessful Outreach Note  07/02/2020 Name: Elizabeth Bush MRN: [id number] DOB: [birth year]  Referred by: Ladell Pier, MD Reason for referral : High Risk Managed Medicaid (Unsuccessful RNCM follow up outreach)   An unsuccessful telephone outreach was attempted today. The patient was referred to the case management team for assistance with care management and care coordination.   Follow Up Plan: A HIPAA compliant phone message was left for the patient providing contact information and requesting a return call.  The care management team will reach out to the patient again over the next 7-14 days.   [name], BSN [place]  [employer] manager

## 2020-07-02 NOTE — Patient Instructions (Signed)
Visit Information  Ms. Ayshia M Brozowski  - as a part of your Medicaid benefit, you are eligible for care management and care coordination services at no cost or copay. I was unable to reach you by phone today but would be happy to help you with your health related needs. Please feel free to call me @ [phone].   A member of the Managed Medicaid care management team will reach out to you again over the next 7-14 days.   [name], BSN [place]  [employer] manager

## 2020-07-03 ENCOUNTER — Ambulatory Visit: Payer: Medicaid Other | Admitting: Physical Therapy

## 2020-07-03 ENCOUNTER — Encounter: Payer: Self-pay | Admitting: Physical Therapy

## 2020-07-03 ENCOUNTER — Telehealth: Payer: Self-pay | Admitting: *Deleted

## 2020-07-03 ENCOUNTER — Other Ambulatory Visit: Payer: Self-pay

## 2020-07-03 DIAGNOSIS — M25562 Pain in left knee: Secondary | ICD-10-CM

## 2020-07-03 DIAGNOSIS — R262 Difficulty in walking, not elsewhere classified: Secondary | ICD-10-CM

## 2020-07-03 DIAGNOSIS — M25662 Stiffness of left knee, not elsewhere classified: Secondary | ICD-10-CM | POA: Diagnosis not present

## 2020-07-03 DIAGNOSIS — M6281 Muscle weakness (generalized): Secondary | ICD-10-CM | POA: Diagnosis not present

## 2020-07-03 DIAGNOSIS — G8929 Other chronic pain: Secondary | ICD-10-CM | POA: Diagnosis not present

## 2020-07-03 DIAGNOSIS — R601 Generalized edema: Secondary | ICD-10-CM | POA: Diagnosis not present

## 2020-07-03 DIAGNOSIS — R278 Other lack of coordination: Secondary | ICD-10-CM | POA: Diagnosis not present

## 2020-07-03 NOTE — Therapy (Signed)
[facility] [address] [place], [place], [address] Phone: [phone]   Fax:  [phone]  Physical Therapy Treatment  Patient Details  Name: Elizabeth Bush MRN: [id number] Date of Birth: 19-Sep-1970 Referring Provider (PT): [name], MD   Encounter Date: 07/03/2020   PT End of Session - 07/03/20 1151    Visit Number 8    Number of Visits 17    Date for PT Re-Evaluation 08/20/20    Authorization Type [employer]    Authorization Time Period 05/29/2020-07/27/2020    PT Start Time 1148    PT Stop Time 1227    PT Time Calculation (min) 39 min    Activity Tolerance Patient limited by pain    Behavior During Therapy Anxious           Past Medical History:  Diagnosis Date  . Anemia   . Anxiety   . Arthritis   . Chlamydia   . Depression   . Dyspnea    when walkking   . GERD (gastroesophageal reflux disease)   . Gonorrhea   . Hypertension     Past Surgical History:  Procedure Laterality Date  . COLONOSCOPY  05/31/2011   Procedure: COLONOSCOPY;  Surgeon: Landry Dyke, MD;  Location: [facility];  Service: Endoscopy;  Laterality: N/A;  . ESOPHAGOGASTRODUODENOSCOPY  05/30/2011   Procedure: ESOPHAGOGASTRODUODENOSCOPY (EGD);  Surgeon: Landry Dyke, MD;  Location: [facility];  Service: Endoscopy;  Laterality: N/A;  . GIVENS CAPSULE STUDY  06/01/2011   Procedure: GIVENS CAPSULE STUDY;  Surgeon: Landry Dyke, MD;  Location: [facility];  Service: Endoscopy;  Laterality: N/A;  . TONSILLECTOMY    . TOTAL KNEE ARTHROPLASTY Left 05/03/2020   Procedure: LEFT TOTAL KNEE ARTHROPLASTY;  Surgeon: [name], MD;  Location: [facility];  Service: Orthopedics;  Laterality: Left;  . TUBAL LIGATION      There were no vitals filed for this visit.   Subjective Assessment - 07/03/20 1152    Subjective I took my medication to sleep, which I did. i feel better getting some sleep. RT knee hurts more  than the left. Pt ambulating today with better posture.    Currently in Pain? Yes   Lt knee 6/10, RT 9/10   Pain Descriptors / Indicators --   Rt knee stabbing/sharp  left knee sore                            [facility] Adult PT Treatment/Exercise - 07/03/20 0001      Self-Care   Other Self-Care Comments  Advised pt to go up her stairs using her LTLE at this time in order to giver her RT knee a rest and she can concentrate on strengthening her LT LE. Advised her to do this for a few days to give it a rest. Pt verbally agreed   Educated pt how to use cane for RTLE if it is very painful     Knee/Hip Exercises: Aerobic   Nustep L1 x 12 min with PTA present to monitor pt reposnse      Knee/Hip Exercises: Seated   Long Arc Quad Strengthening;Left;2 sets;10 reps;Weights    Long Arc Quad Weight 2 lbs.   VC to not bounce   Hamstring Curl Strengthening;AROM;Left;2 sets;10 reps;Weights    Hamstring Limitations green band      Knee/Hip Exercises: Supine   Short Arc Quad Sets Strengthening;Left;1 set;10 reps  2#   Heel Slides AAROM;Left;1 set;20 reps    Heel Slides Limitations foot on heel slider    Straight Leg Raises Strengthening;Left;1 set;10 reps                    PT Short Term Goals - 06/12/20 1255      PT SHORT TERM GOAL #1   Title independent with initial HEP    Time 4    Period Weeks    Status Achieved    Target Date 04/25/20             PT Long Term Goals - 05/28/20 1613      PT LONG TERM GOAL #1   Title independent with advanced HEP    Time 12    Period Weeks    Status New    Target Date 08/20/20      PT LONG TERM GOAL #2   Title knee flexion 120 for ability to negotiate stairs    Baseline 86 deg    Time 12    Period Weeks    Status New    Target Date 08/20/20      PT LONG TERM GOAL #3   Title LEFS improved by at least 9 points for statistically significant funcitonal improvement    Time 12    Period Weeks    Status New    Target  Date 08/20/20      PT LONG TERM GOAL #4   Title pt able to go up and down stairs at home to get to the bathroom with only mild pain at most    Baseline reports severe pain 9/10    Time 12    Period Weeks    Status New    Target Date 08/20/20                 Plan - 07/03/20 1151    Clinical Impression Statement Pt decided to take her prescribed medication yesterday which helped her finally sleep. She reports overall feeling better after getting her sleep. RT knee feels worse than the Lt. Pt absolutely ambulating better than on Monday, she is making a better effort to stand up straighter. Treatment focus was on knee ROM and gentle functional strength. Pt had no obvious difficulty with 2# weights. PTA suggested if she felt the RT knee pain was her main issue maybe she shoulde use the cane to unweight that leg. Pt was able to correctly demo using the cane this way.    Personal Factors and Comorbidities Age;Fitness;Past/Current Experience;Social Background;Comorbidity 1;Transportation;Finances;Education    Comorbidities s/p knee surgery    Examination-Activity Limitations Locomotion Level;Continence;Dressing;Bed Mobility;Bend;Transfers;Stairs;Stand;Bathing    Examination-Participation Restrictions Cleaning;Community Activity;Shop;Personal Finances    Stability/Clinical Decision Making Evolving/Moderate complexity    Rehab Potential Good    PT Frequency 2x / week    PT Duration 12 weeks    PT Treatment/Interventions ADLs/Self Care Home Management;Biofeedback;Cryotherapy;Electrical Stimulation;Moist Heat;Neuromuscular re-education;Therapeutic exercise;Therapeutic activities;Gait training;Patient/family education;Manual techniques;Energy conservation;Joint Manipulations;Stair training;Passive range of motion;Dry needling;Taping    PT Next Visit Plan Encourage pt to trust her knee in standing more, ROM to LE    PT Home Exercise Plan Access Code: [email or web address]    Consulted and Agree with Plan of  Care Patient           Patient will benefit from skilled therapeutic intervention in order to improve the following deficits and impairments:  Abnormal gait,Decreased coordination,Increased fascial restricitons,Decreased endurance,Decreased activity tolerance,Pain,Increased muscle spasms,Decreased strength,Postural dysfunction,Decreased range of motion  Visit Diagnosis: Muscle weakness (generalized)  Stiffness of left knee, not elsewhere classified  Difficulty in walking, not elsewhere classified  Chronic pain of left knee  Other lack of coordination     Problem List Patient Active Problem List   Diagnosis Date Noted  . 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 Unilateral primary osteoarthritis, left knee 05/02/2020  . Light smoker 04/08/2020  . Influenza vaccine needed 02/06/2020  . Dependent for transportation 12/12/2019  . 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 Functional urinary incontinence 12/12/2019  . Rectal prolapse 10/31/2019  . Moderate episode of recurrent major depressive disorder ([place]) 10/31/2019  . Primary osteoarthritis of both knees 10/31/2019  . IDA (iron deficiency anemia) 10/30/2019  . Fibroids 10/30/2019  . Unilateral primary osteoarthritis, right knee 09/27/2019  . Alcohol abuse 11/05/2016  . Essential hypertension 11/05/2016  . Grade III hemorrhoids 11/05/2016  . GIB (gastrointestinal bleeding) 05/29/2011    Elizabeth Bush, PTA 07/03/2020, 2:44 PM  [facility] [address] [place], [place], [address] Phone: [phone]   Fax:  [phone]  Name: Elizabeth Bush MRN: [id number] Date of Birth: 05-18-1971

## 2020-07-03 NOTE — Telephone Encounter (Signed)
Patient missed appts for labs and Dr. Irene Limbo on 07/02/20 Message sent to scheduling to contact patient and r/s appts at her conveninece

## 2020-07-04 ENCOUNTER — Other Ambulatory Visit: Payer: Self-pay | Admitting: Orthopaedic Surgery

## 2020-07-05 ENCOUNTER — Telehealth: Payer: Self-pay | Admitting: Orthopaedic Surgery

## 2020-07-05 ENCOUNTER — Telehealth: Payer: Self-pay | Admitting: Hematology

## 2020-07-05 MED ORDER — METHOCARBAMOL 500 MG PO TABS: 500.0000 mg | ORAL_TABLET | Freq: Four times a day (QID) | ORAL | 0 refills | Status: DC | PRN

## 2020-07-05 NOTE — Telephone Encounter (Signed)
Called pt per 2/16 sch msg - no answer and no vmail. Sent SMS notice.

## 2020-07-05 NOTE — Telephone Encounter (Signed)
[place] Customer service rep.from [facility] [employer] called on behalf of patient. Pt is requesting prescription of methocarbomal. Patient has a new pharmacy and need script sent to [facility] phone number to pharmacy is [phone]. Patient phone number is [phone].

## 2020-07-08 ENCOUNTER — Encounter: Payer: Medicaid Other | Admitting: Physical Therapy

## 2020-07-08 ENCOUNTER — Other Ambulatory Visit: Payer: Self-pay

## 2020-07-10 ENCOUNTER — Ambulatory Visit: Payer: Medicaid Other | Admitting: Physical Therapy

## 2020-07-10 ENCOUNTER — Ambulatory Visit (INDEPENDENT_AMBULATORY_CARE_PROVIDER_SITE_OTHER): Payer: Medicaid Other | Admitting: Orthopaedic Surgery

## 2020-07-10 ENCOUNTER — Encounter: Payer: Self-pay | Admitting: Orthopaedic Surgery

## 2020-07-10 ENCOUNTER — Encounter: Payer: Self-pay | Admitting: Physical Therapy

## 2020-07-10 ENCOUNTER — Other Ambulatory Visit: Payer: Self-pay

## 2020-07-10 DIAGNOSIS — R262 Difficulty in walking, not elsewhere classified: Secondary | ICD-10-CM

## 2020-07-10 DIAGNOSIS — M25662 Stiffness of left knee, not elsewhere classified: Secondary | ICD-10-CM | POA: Diagnosis not present

## 2020-07-10 DIAGNOSIS — R601 Generalized edema: Secondary | ICD-10-CM | POA: Diagnosis not present

## 2020-07-10 DIAGNOSIS — M6281 Muscle weakness (generalized): Secondary | ICD-10-CM

## 2020-07-10 DIAGNOSIS — M25562 Pain in left knee: Secondary | ICD-10-CM | POA: Diagnosis not present

## 2020-07-10 DIAGNOSIS — G8929 Other chronic pain: Secondary | ICD-10-CM

## 2020-07-10 DIAGNOSIS — Z96652 Presence of left artificial knee joint: Secondary | ICD-10-CM

## 2020-07-10 DIAGNOSIS — R278 Other lack of coordination: Secondary | ICD-10-CM | POA: Diagnosis not present

## 2020-07-10 NOTE — Therapy (Addendum)
[facility] [address], [place], [place], [address] Phone: [phone]   Fax:  [phone]  Physical Therapy Treatment  Patient Details  Name: Elizabeth Bush MRN: [id number] Date of Birth: 20-Jun-1970 Referring Provider (PT): [name], MD   Encounter Date: 07/10/2020   PT End of Session - 07/10/20 1446    Visit Number 9    Date for PT Re-Evaluation 08/20/20    Authorization Type [employer]    Authorization Time Period 05/29/2020-07/27/2020    PT Start Time 1446    PT Stop Time 1530    PT Time Calculation (min) 44 min    Activity Tolerance Patient limited by pain           Past Medical History:  Diagnosis Date  . Anemia   . Anxiety   . Arthritis   . Chlamydia   . Depression   . Dyspnea    when walkking   . GERD (gastroesophageal reflux disease)   . Gonorrhea   . Hypertension     Past Surgical History:  Procedure Laterality Date  . COLONOSCOPY  05/31/2011   Procedure: COLONOSCOPY;  Surgeon: Landry Dyke, MD;  Location: [facility];  Service: Endoscopy;  Laterality: N/A;  . ESOPHAGOGASTRODUODENOSCOPY  05/30/2011   Procedure: ESOPHAGOGASTRODUODENOSCOPY (EGD);  Surgeon: Landry Dyke, MD;  Location: [facility];  Service: Endoscopy;  Laterality: N/A;  . GIVENS CAPSULE STUDY  06/01/2011   Procedure: GIVENS CAPSULE STUDY;  Surgeon: Landry Dyke, MD;  Location: [facility];  Service: Endoscopy;  Laterality: N/A;  . TONSILLECTOMY    . TOTAL KNEE ARTHROPLASTY Left 05/03/2020   Procedure: LEFT TOTAL KNEE ARTHROPLASTY;  Surgeon: [name], MD;  Location: [facility];  Service: Orthopedics;  Laterality: Left;  . TUBAL LIGATION      There were no vitals filed for this visit.   Subjective Assessment - 07/10/20 1452    Subjective Pt states she feel about 70% improved but it is just hard when I stand up after sitting and the steps at home is hard.  In and out of the tub                              [facility] Adult PT Treatment/Exercise - 07/10/20 0001      Lumbar Exercises: Aerobic   Stationary Bike 5 min for ROM - PT present for status update      Knee/Hip Exercises: Stretches   Active Hamstring Stretch Right;Left;5 reps;10 seconds      Knee/Hip Exercises: Aerobic   Nustep L2 x 4 min for endurance and quad strength at the end of treatment      Knee/Hip Exercises: Standing   Knee Flexion Limitations standing with foot on second step - flexion and hip ext    Hip Abduction Stengthening;Right;Left    Forward Step Up Right;Left;5 reps;Hand Hold: 1   cues to use only one hand   Other Standing Knee Exercises standing pelvic tilt against the wall      Knee/Hip Exercises: Seated   Long Arc Quad Strengthening;Left;2 sets;10 reps;Weights    Long Arc Quad Weight 2 lbs.   VC to not bounce and engage core   Knee/Hip Flexion 2 lb - 10 x each side    Hamstring Curl Strengthening;AROM;Left;2 sets;10 reps;Weights    Hamstring Limitations green band  PT Short Term Goals - 06/12/20 1255      PT SHORT TERM GOAL #1   Title independent with initial HEP    Time 4    Period Weeks    Status Achieved    Target Date 04/25/20             PT Long Term Goals - 05/28/20 1613      PT LONG TERM GOAL #1   Title independent with advanced HEP    Time 12    Period Weeks    Status New    Target Date 08/20/20      PT LONG TERM GOAL #2   Title knee flexion 120 for ability to negotiate stairs    Baseline 86 deg    Time 12    Period Weeks    Status New    Target Date 08/20/20      PT LONG TERM GOAL #3   Title LEFS improved by at least 9 points for statistically significant funcitonal improvement    Time 12    Period Weeks    Status New    Target Date 08/20/20      PT LONG TERM GOAL #4   Title pt able to go up and down stairs at home to get to the bathroom with only mild pain at most    Baseline reports severe pain 9/10     Time 12    Period Weeks    Status New    Target Date 08/20/20                 Plan - 07/10/20 1531    Clinical Impression Statement Pt demonstrates improved posture especially with cues to engage core and stand up tall.  Today's session emphasized including core for improved posture and better mechanics when loading the LEs.  Pt was able to tolerate more standing and exercise progressions today.  pt will benefit from skilled PT to continue to progress strength for improved function.  At this time she reports she is 70% improved since starting PT    PT Treatment/Interventions ADLs/Self Care Home Management;Biofeedback;Cryotherapy;Electrical Stimulation;Moist Heat;Neuromuscular re-education;Therapeutic exercise;Therapeutic activities;Gait training;Patient/family education;Manual techniques;Energy conservation;Joint Manipulations;Stair training;Passive range of motion;Dry needling;Taping    PT Next Visit Plan continue to progress with standing and increased hip extension and engaged core; continue step ups and functional strength    PT Home Exercise Plan Access Code: [email or web address]    Consulted and Agree with Plan of Care Patient           Patient will benefit from skilled therapeutic intervention in order to improve the following deficits and impairments:  Abnormal gait,Decreased coordination,Increased fascial restricitons,Decreased endurance,Decreased activity tolerance,Pain,Increased muscle spasms,Decreased strength,Postural dysfunction,Decreased range of motion  Visit Diagnosis: Muscle weakness (generalized)  Stiffness of left knee, not elsewhere classified  Difficulty in walking, not elsewhere classified  Chronic pain of left knee     Problem List Patient Active Problem List   Diagnosis Date Noted  . 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 Unilateral primary osteoarthritis, left knee 05/02/2020  . Light smoker 04/08/2020  . Influenza vaccine needed 02/06/2020   . Dependent for transportation 12/12/2019  . 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 Functional urinary incontinence 12/12/2019  . Rectal prolapse 10/31/2019  . Moderate episode of recurrent major depressive disorder ([place]) 10/31/2019  . Primary osteoarthritis of both knees 10/31/2019  . IDA (iron deficiency anemia) 10/30/2019  . Fibroids 10/30/2019  . Unilateral primary osteoarthritis, right knee 09/27/2019  .  Alcohol abuse 11/05/2016  . Essential hypertension 11/05/2016  . Grade III hemorrhoids 11/05/2016  . GIB (gastrointestinal bleeding) 05/29/2011    Jule Ser, PT 07/10/2020, 5:47 PM  [facility] [address], [place], [place], [address] Phone: [phone]   Fax:  [phone]  Name: Elizabeth Bush MRN: [id number] Date of Birth: 1970-11-12  PHYSICAL THERAPY DISCHARGE SUMMARY  Visits from Start of Care: 9  Current functional level related to goals / functional outcomes: See above    Remaining deficits: See above   Education / Equipment: HEP Plan: Patient agrees to discharge.  Patient goals were not met. Patient is being discharged due to the patient's request.  ?????    Gustavus Bryant, PT 07/27/20 10:40 AM

## 2020-07-10 NOTE — Progress Notes (Signed)
HPI: [name] returns today status post left total knee arthroplasty 05/03/2020.  She states she is overall doing well she did have a Doppler since she was last seen this was negative for DVT.  She continues to have leg swelling.  She is on 81 mg aspirin daily and has had no adverse effects to this.  She continues work with physical therapy.  She is taking no pain medications at this point is taking muscle relaxant.  Physical exam: Left knee full extension flexion to 105 degrees.  Surgical incisions healing well.  Calf supple but tender.  She has swelling left lower leg.  Impression: Status post left total knee arthroplasty 05/03/2020  Plan: Discussed with her to work on scar tissue mobilization for desensitizing the area and reducing scar tissue.  Continue physical therapy to work on range of motion strengthening.  She spoke of the compression hose and begin wearing this during the day it needs to be thigh-high.  Continue her aspirin 81 mg daily.  She will follow-up with [place] in 1 month sooner if there is any questions concerns.

## 2020-07-15 ENCOUNTER — Encounter: Payer: Medicaid Other | Admitting: Physical Therapy

## 2020-07-16 DIAGNOSIS — Z419 Encounter for procedure for purposes other than remedying health state, unspecified: Secondary | ICD-10-CM | POA: Diagnosis not present

## 2020-07-17 ENCOUNTER — Ambulatory Visit: Payer: Medicaid Other | Admitting: Physical Therapy

## 2020-07-17 ENCOUNTER — Telehealth: Payer: Self-pay | Admitting: Internal Medicine

## 2020-07-17 ENCOUNTER — Telehealth: Payer: Self-pay | Admitting: Physical Therapy

## 2020-07-17 NOTE — Telephone Encounter (Signed)
Spoke with PT after todays cancellation for Physical Therapy. Pt verbally states coming to therapy at our facility is too much right now and feels she being being pushed too fast in her walking." Myrene Galas, PTA @TODAY @ 3:17 PM

## 2020-07-17 NOTE — Telephone Encounter (Signed)
Patient called to ask the doctor to send in another referral or a rehab facility that is closer to her home.  She is having transportation issues when picking her up after treatment.  Please call to confirm at [phone]

## 2020-07-17 NOTE — Telephone Encounter (Signed)
Elizabeth Bush are you able to help with this

## 2020-07-18 ENCOUNTER — Other Ambulatory Visit: Payer: Self-pay

## 2020-07-18 ENCOUNTER — Telehealth: Payer: Self-pay | Admitting: Orthopaedic Surgery

## 2020-07-18 DIAGNOSIS — Z96652 Presence of left artificial knee joint: Secondary | ICD-10-CM

## 2020-07-18 NOTE — Telephone Encounter (Signed)
Can we get her in here?

## 2020-07-18 NOTE — Telephone Encounter (Signed)
Good Morning  I don't see a referral placed for rehab . I see Orthopedics referral but she is been seen there . I called patient lvm

## 2020-07-18 NOTE — Telephone Encounter (Signed)
Patient states she would like a referral placed with another rehabilitation facility regarding her  knee post surgery. Patient states PCP referred her to Dr. Rush Farmer to have knee surgery. Patient unsure who placed the rehab  referral post surgery and would like PCP to refer her, please leave a detail message

## 2020-07-18 NOTE — Telephone Encounter (Signed)
Patient called asked if she can be referred to another (PT) facility? Patient said the (PT) at [facility] was not listening to her as she told them how she was feeling doing (PT)  The number to contact patient is [phone]

## 2020-07-18 NOTE — Telephone Encounter (Signed)
Will forward to provider  

## 2020-07-19 NOTE — Telephone Encounter (Signed)
Contacted pt to go over Dr. Johnson response pt is aware and doesn't have any questions or concerns  

## 2020-07-22 ENCOUNTER — Encounter: Payer: Medicaid Other | Admitting: Physical Therapy

## 2020-07-22 ENCOUNTER — Telehealth: Payer: Self-pay | Admitting: Internal Medicine

## 2020-07-22 NOTE — Telephone Encounter (Signed)
I don't see where anyone has contacted pt

## 2020-07-22 NOTE — Telephone Encounter (Signed)
Copied from [place] [id number]. Topic: General - Other >> Jul 22, 2020 10:40 AM Celene Kras wrote: Reason for CRM: Pt calling stating that she received a call from office with no VM. Please advise.  Don't see any documentation where patient was called. Please follow up if appropriate.

## 2020-07-23 ENCOUNTER — Encounter (HOSPITAL_COMMUNITY): Payer: Self-pay

## 2020-07-23 ENCOUNTER — Inpatient Hospital Stay (HOSPITAL_COMMUNITY): Payer: Medicaid Other

## 2020-07-23 ENCOUNTER — Emergency Department (HOSPITAL_COMMUNITY): Payer: Medicaid Other

## 2020-07-23 ENCOUNTER — Other Ambulatory Visit: Payer: Self-pay

## 2020-07-23 ENCOUNTER — Inpatient Hospital Stay (HOSPITAL_COMMUNITY)
Admission: EM | Admit: 2020-07-23 | Discharge: 2020-07-26 | DRG: 964 | Disposition: A | Discharge status: Rehabilitation Facility | Payer: Medicaid Other | Attending: Physician Assistant | Admitting: Physician Assistant

## 2020-07-23 DIAGNOSIS — G8929 Other chronic pain: Secondary | ICD-10-CM | POA: Diagnosis not present

## 2020-07-23 DIAGNOSIS — Z23 Encounter for immunization: Secondary | ICD-10-CM

## 2020-07-23 DIAGNOSIS — S065X9A Traumatic subdural hemorrhage with loss of consciousness of unspecified duration, initial encounter: Secondary | ICD-10-CM | POA: Diagnosis not present

## 2020-07-23 DIAGNOSIS — S098XXA Other specified injuries of head, initial encounter: Secondary | ICD-10-CM | POA: Diagnosis not present

## 2020-07-23 DIAGNOSIS — S069XAA Unspecified intracranial injury with loss of consciousness status unknown, initial encounter: Secondary | ICD-10-CM

## 2020-07-23 DIAGNOSIS — I10 Essential (primary) hypertension: Secondary | ICD-10-CM | POA: Diagnosis present

## 2020-07-23 DIAGNOSIS — T1490XA Injury, unspecified, initial encounter: Secondary | ICD-10-CM

## 2020-07-23 DIAGNOSIS — S32422A Displaced fracture of posterior wall of left acetabulum, initial encounter for closed fracture: Secondary | ICD-10-CM | POA: Diagnosis present

## 2020-07-23 DIAGNOSIS — S32445A Nondisplaced fracture of posterior column [ilioischial] of left acetabulum, initial encounter for closed fracture: Secondary | ICD-10-CM | POA: Diagnosis not present

## 2020-07-23 DIAGNOSIS — T07XXXA Unspecified multiple injuries, initial encounter: Secondary | ICD-10-CM

## 2020-07-23 DIAGNOSIS — R4182 Altered mental status, unspecified: Secondary | ICD-10-CM | POA: Diagnosis not present

## 2020-07-23 DIAGNOSIS — F32A Depression, unspecified: Secondary | ICD-10-CM | POA: Diagnosis present

## 2020-07-23 DIAGNOSIS — Z823 Family history of stroke: Secondary | ICD-10-CM

## 2020-07-23 DIAGNOSIS — Z833 Family history of diabetes mellitus: Secondary | ICD-10-CM | POA: Diagnosis not present

## 2020-07-23 DIAGNOSIS — S065X0S Traumatic subdural hemorrhage without loss of consciousness, sequela: Secondary | ICD-10-CM | POA: Diagnosis not present

## 2020-07-23 DIAGNOSIS — S065X1S Traumatic subdural hemorrhage with loss of consciousness of 30 minutes or less, sequela: Secondary | ICD-10-CM | POA: Diagnosis not present

## 2020-07-23 DIAGNOSIS — S1093XA Contusion of unspecified part of neck, initial encounter: Secondary | ICD-10-CM | POA: Diagnosis not present

## 2020-07-23 DIAGNOSIS — S069X0D Unspecified intracranial injury without loss of consciousness, subsequent encounter: Secondary | ICD-10-CM | POA: Diagnosis not present

## 2020-07-23 DIAGNOSIS — Z96652 Presence of left artificial knee joint: Secondary | ICD-10-CM | POA: Diagnosis not present

## 2020-07-23 DIAGNOSIS — R000 Tachycardia, unspecified: Secondary | ICD-10-CM | POA: Diagnosis not present

## 2020-07-23 DIAGNOSIS — S8001XA Contusion of right knee, initial encounter: Secondary | ICD-10-CM | POA: Diagnosis not present

## 2020-07-23 DIAGNOSIS — M25562 Pain in left knee: Secondary | ICD-10-CM

## 2020-07-23 DIAGNOSIS — Z20822 Contact with and (suspected) exposure to covid-19: Secondary | ICD-10-CM | POA: Diagnosis present

## 2020-07-23 DIAGNOSIS — S065X9D Traumatic subdural hemorrhage with loss of consciousness of unspecified duration, subsequent encounter: Secondary | ICD-10-CM | POA: Diagnosis not present

## 2020-07-23 DIAGNOSIS — K5903 Drug induced constipation: Secondary | ICD-10-CM | POA: Diagnosis not present

## 2020-07-23 DIAGNOSIS — S27322A Contusion of lung, bilateral, initial encounter: Secondary | ICD-10-CM

## 2020-07-23 DIAGNOSIS — K219 Gastro-esophageal reflux disease without esophagitis: Secondary | ICD-10-CM | POA: Diagnosis present

## 2020-07-23 DIAGNOSIS — Y92410 Unspecified street and highway as the place of occurrence of the external cause: Secondary | ICD-10-CM

## 2020-07-23 DIAGNOSIS — M1990 Unspecified osteoarthritis, unspecified site: Secondary | ICD-10-CM | POA: Diagnosis not present

## 2020-07-23 DIAGNOSIS — Z7982 Long term (current) use of aspirin: Secondary | ICD-10-CM | POA: Diagnosis not present

## 2020-07-23 DIAGNOSIS — Z041 Encounter for examination and observation following transport accident: Secondary | ICD-10-CM | POA: Diagnosis not present

## 2020-07-23 DIAGNOSIS — J929 Pleural plaque without asbestos: Secondary | ICD-10-CM | POA: Diagnosis not present

## 2020-07-23 DIAGNOSIS — S069X9A Unspecified intracranial injury with loss of consciousness of unspecified duration, initial encounter: Secondary | ICD-10-CM

## 2020-07-23 DIAGNOSIS — Z888 Allergy status to other drugs, medicaments and biological substances status: Secondary | ICD-10-CM

## 2020-07-23 DIAGNOSIS — F419 Anxiety disorder, unspecified: Secondary | ICD-10-CM | POA: Diagnosis not present

## 2020-07-23 DIAGNOSIS — M1711 Unilateral primary osteoarthritis, right knee: Secondary | ICD-10-CM | POA: Diagnosis present

## 2020-07-23 DIAGNOSIS — S63501D Unspecified sprain of right wrist, subsequent encounter: Secondary | ICD-10-CM | POA: Diagnosis not present

## 2020-07-23 DIAGNOSIS — S065XAA Traumatic subdural hemorrhage with loss of consciousness status unknown, initial encounter: Secondary | ICD-10-CM

## 2020-07-23 DIAGNOSIS — Z8249 Family history of ischemic heart disease and other diseases of the circulatory system: Secondary | ICD-10-CM

## 2020-07-23 DIAGNOSIS — S32422D Displaced fracture of posterior wall of left acetabulum, subsequent encounter for fracture with routine healing: Secondary | ICD-10-CM | POA: Diagnosis not present

## 2020-07-23 DIAGNOSIS — Z825 Family history of asthma and other chronic lower respiratory diseases: Secondary | ICD-10-CM | POA: Diagnosis not present

## 2020-07-23 DIAGNOSIS — S32425A Nondisplaced fracture of posterior wall of left acetabulum, initial encounter for closed fracture: Secondary | ICD-10-CM

## 2020-07-23 DIAGNOSIS — S0101XD Laceration without foreign body of scalp, subsequent encounter: Secondary | ICD-10-CM | POA: Diagnosis not present

## 2020-07-23 DIAGNOSIS — S81012A Laceration without foreign body, left knee, initial encounter: Secondary | ICD-10-CM | POA: Diagnosis not present

## 2020-07-23 DIAGNOSIS — M79605 Pain in left leg: Secondary | ICD-10-CM

## 2020-07-23 DIAGNOSIS — N179 Acute kidney failure, unspecified: Secondary | ICD-10-CM

## 2020-07-23 DIAGNOSIS — S32402A Unspecified fracture of left acetabulum, initial encounter for closed fracture: Secondary | ICD-10-CM | POA: Diagnosis not present

## 2020-07-23 DIAGNOSIS — F17210 Nicotine dependence, cigarettes, uncomplicated: Secondary | ICD-10-CM | POA: Diagnosis present

## 2020-07-23 DIAGNOSIS — R079 Chest pain, unspecified: Secondary | ICD-10-CM | POA: Diagnosis present

## 2020-07-23 DIAGNOSIS — M7989 Other specified soft tissue disorders: Secondary | ICD-10-CM | POA: Diagnosis not present

## 2020-07-23 DIAGNOSIS — D62 Acute posthemorrhagic anemia: Secondary | ICD-10-CM | POA: Diagnosis present

## 2020-07-23 DIAGNOSIS — S0003XA Contusion of scalp, initial encounter: Secondary | ICD-10-CM | POA: Diagnosis not present

## 2020-07-23 DIAGNOSIS — S065X0A Traumatic subdural hemorrhage without loss of consciousness, initial encounter: Secondary | ICD-10-CM | POA: Diagnosis not present

## 2020-07-23 DIAGNOSIS — S12000A Unspecified displaced fracture of first cervical vertebra, initial encounter for closed fracture: Secondary | ICD-10-CM | POA: Diagnosis not present

## 2020-07-23 DIAGNOSIS — S12040A Displaced lateral mass fracture of first cervical vertebra, initial encounter for closed fracture: Secondary | ICD-10-CM | POA: Diagnosis not present

## 2020-07-23 DIAGNOSIS — K118 Other diseases of salivary glands: Secondary | ICD-10-CM | POA: Diagnosis not present

## 2020-07-23 DIAGNOSIS — R918 Other nonspecific abnormal finding of lung field: Secondary | ICD-10-CM | POA: Diagnosis not present

## 2020-07-23 DIAGNOSIS — J90 Pleural effusion, not elsewhere classified: Secondary | ICD-10-CM | POA: Diagnosis not present

## 2020-07-23 DIAGNOSIS — S27329A Contusion of lung, unspecified, initial encounter: Secondary | ICD-10-CM | POA: Diagnosis not present

## 2020-07-23 DIAGNOSIS — S12001A Unspecified nondisplaced fracture of first cervical vertebra, initial encounter for closed fracture: Secondary | ICD-10-CM | POA: Diagnosis not present

## 2020-07-23 DIAGNOSIS — Z7409 Other reduced mobility: Secondary | ICD-10-CM | POA: Diagnosis not present

## 2020-07-23 DIAGNOSIS — Z79899 Other long term (current) drug therapy: Secondary | ICD-10-CM

## 2020-07-23 DIAGNOSIS — I959 Hypotension, unspecified: Secondary | ICD-10-CM | POA: Diagnosis present

## 2020-07-23 DIAGNOSIS — S0990XA Unspecified injury of head, initial encounter: Secondary | ICD-10-CM | POA: Diagnosis not present

## 2020-07-23 DIAGNOSIS — M25561 Pain in right knee: Secondary | ICD-10-CM | POA: Diagnosis not present

## 2020-07-23 DIAGNOSIS — Z801 Family history of malignant neoplasm of trachea, bronchus and lung: Secondary | ICD-10-CM

## 2020-07-23 DIAGNOSIS — S069X0A Unspecified intracranial injury without loss of consciousness, initial encounter: Secondary | ICD-10-CM | POA: Diagnosis not present

## 2020-07-23 DIAGNOSIS — Z298 Encounter for other specified prophylactic measures: Secondary | ICD-10-CM

## 2020-07-23 DIAGNOSIS — S12040D Displaced lateral mass fracture of first cervical vertebra, subsequent encounter for fracture with routine healing: Secondary | ICD-10-CM | POA: Diagnosis not present

## 2020-07-23 DIAGNOSIS — S2242XA Multiple fractures of ribs, left side, initial encounter for closed fracture: Secondary | ICD-10-CM | POA: Diagnosis not present

## 2020-07-23 DIAGNOSIS — S0993XA Unspecified injury of face, initial encounter: Secondary | ICD-10-CM | POA: Diagnosis not present

## 2020-07-23 DIAGNOSIS — S0100XA Unspecified open wound of scalp, initial encounter: Secondary | ICD-10-CM | POA: Diagnosis not present

## 2020-07-23 DIAGNOSIS — I6201 Nontraumatic acute subdural hemorrhage: Secondary | ICD-10-CM | POA: Diagnosis not present

## 2020-07-23 DIAGNOSIS — S065X0D Traumatic subdural hemorrhage without loss of consciousness, subsequent encounter: Secondary | ICD-10-CM | POA: Diagnosis not present

## 2020-07-23 DIAGNOSIS — Z743 Need for continuous supervision: Secondary | ICD-10-CM | POA: Diagnosis not present

## 2020-07-23 DIAGNOSIS — S0101XA Laceration without foreign body of scalp, initial encounter: Secondary | ICD-10-CM

## 2020-07-23 DIAGNOSIS — S3993XA Unspecified injury of pelvis, initial encounter: Secondary | ICD-10-CM | POA: Diagnosis not present

## 2020-07-23 DIAGNOSIS — R52 Pain, unspecified: Secondary | ICD-10-CM | POA: Diagnosis not present

## 2020-07-23 DIAGNOSIS — S299XXA Unspecified injury of thorax, initial encounter: Secondary | ICD-10-CM | POA: Diagnosis not present

## 2020-07-23 DIAGNOSIS — S32442D Displaced fracture of posterior column [ilioischial] of left acetabulum, subsequent encounter for fracture with routine healing: Secondary | ICD-10-CM | POA: Diagnosis not present

## 2020-07-23 DIAGNOSIS — M25539 Pain in unspecified wrist: Secondary | ICD-10-CM | POA: Diagnosis present

## 2020-07-23 LAB — CBC
HCT: 35.1 % — ABNORMAL LOW (ref 36.0–46.0)
Hemoglobin: 11.7 g/dL — ABNORMAL LOW (ref 12.0–15.0)
MCH: 31.5 pg (ref 26.0–34.0)
MCHC: 33.3 g/dL (ref 30.0–36.0)
MCV: 94.6 fL (ref 80.0–100.0)
Platelets: 287 10*3/uL (ref 150–400)
RBC: 3.71 MIL/uL — ABNORMAL LOW (ref 3.87–5.11)
RDW: 13.3 % (ref 11.5–15.5)
WBC: 8.3 10*3/uL (ref 4.0–10.5)
nRBC: 0 % (ref 0.0–0.2)

## 2020-07-23 LAB — COMPREHENSIVE METABOLIC PANEL
ALT: 48 U/L — ABNORMAL HIGH (ref 0–44)
AST: 97 U/L — ABNORMAL HIGH (ref 15–41)
Albumin: 3.4 g/dL — ABNORMAL LOW (ref 3.5–5.0)
Alkaline Phosphatase: 77 U/L (ref 38–126)
Anion gap: 14 (ref 5–15)
BUN: 7 mg/dL (ref 6–20)
CO2: 19 mmol/L — ABNORMAL LOW (ref 22–32)
Calcium: 9.7 mg/dL (ref 8.9–10.3)
Chloride: 105 mmol/L (ref 98–111)
Creatinine, Ser: 0.74 mg/dL (ref 0.44–1.00)
GFR, Estimated: >60 mL/min (ref >60)
Glucose, Bld: 119 mg/dL — ABNORMAL HIGH (ref 70–99)
Potassium: 3.8 mmol/L (ref 3.5–5.1)
Sodium: 138 mmol/L (ref 135–145)
Total Bilirubin: 1.1 mg/dL (ref 0.3–1.2)
Total Protein: 8.2 g/dL — ABNORMAL HIGH (ref 6.5–8.1)

## 2020-07-23 LAB — PROTIME-INR
INR: 1.1 (ref 0.8–1.2)
Prothrombin Time: 13.9 seconds (ref 11.4–15.2)

## 2020-07-23 LAB — I-STAT CHEM 8, ED
BUN: 8 mg/dL (ref 6–20)
Calcium, Ion: 1.14 mmol/L — ABNORMAL LOW (ref 1.15–1.40)
Chloride: 108 mmol/L (ref 98–111)
Creatinine, Ser: 0.6 mg/dL (ref 0.44–1.00)
Glucose, Bld: 119 mg/dL — ABNORMAL HIGH (ref 70–99)
HCT: 37 % (ref 36.0–46.0)
Hemoglobin: 12.6 g/dL (ref 12.0–15.0)
Potassium: 3.9 mmol/L (ref 3.5–5.1)
Sodium: 142 mmol/L (ref 135–145)
TCO2: 21 mmol/L — ABNORMAL LOW (ref 22–32)

## 2020-07-23 LAB — RESP PANEL BY RT-PCR (FLU A&B, COVID) ARPGX2
Influenza A by PCR: NEGATIVE
Influenza B by PCR: NEGATIVE
SARS Coronavirus 2 by RT PCR: NEGATIVE

## 2020-07-23 LAB — LACTIC ACID, PLASMA: Lactic Acid, Venous: 1.8 mmol/L (ref 0.5–1.9)

## 2020-07-23 LAB — HIV ANTIBODY (ROUTINE TESTING W REFLEX): HIV Screen 4th Generation wRfx: NONREACTIVE

## 2020-07-23 LAB — I-STAT BETA HCG BLOOD, ED (MC, WL, AP ONLY): I-stat hCG, quantitative: <5 m[IU]/mL (ref <5)

## 2020-07-23 LAB — MRSA PCR SCREENING: MRSA by PCR: NEGATIVE

## 2020-07-23 LAB — ETHANOL: Alcohol, Ethyl (B): <10 mg/dL (ref <10)

## 2020-07-23 MED ORDER — AMLODIPINE BESYLATE 10 MG PO TABS: 10.0000 mg | ORAL_TABLET | Freq: Every day | ORAL | Status: DC

## 2020-07-23 MED ORDER — MORPHINE SULFATE (PF) 4 MG/ML IV SOLN
4.0000 mg | INTRAVENOUS | Status: DC | PRN
  Administered 2020-07-23 – 2020-07-25 (×10): 4 mg via INTRAVENOUS
  Filled 2020-07-23 (×13): qty 1

## 2020-07-23 MED ORDER — LACTATED RINGERS IV SOLN: INTRAVENOUS | Status: DC

## 2020-07-23 MED ORDER — DOCUSATE SODIUM 100 MG PO CAPS
100.0000 mg | ORAL_CAPSULE | Freq: Two times a day (BID) | ORAL | Status: DC
  Administered 2020-07-23 – 2020-07-26 (×5): 100 mg via ORAL
  Filled 2020-07-23 (×6): qty 1

## 2020-07-23 MED ORDER — ONDANSETRON HCL 4 MG/2ML IJ SOLN
4.0000 mg | Freq: Once | INTRAMUSCULAR | Status: AC
  Administered 2020-07-23: 4 mg via INTRAVENOUS
  Filled 2020-07-23: qty 2

## 2020-07-23 MED ORDER — LIDOCAINE-EPINEPHRINE 1 %-1:100000 IJ SOLN
INTRAMUSCULAR | Status: AC
  Filled 2020-07-23: qty 1

## 2020-07-23 MED ORDER — MORPHINE SULFATE (PF) 2 MG/ML IV SOLN: 1.0000 mg | INTRAVENOUS | Status: DC | PRN

## 2020-07-23 MED ORDER — MORPHINE SULFATE (PF) 4 MG/ML IV SOLN: 8.0000 mg | Freq: Once | INTRAVENOUS | Status: AC

## 2020-07-23 MED ORDER — SODIUM CHLORIDE 0.9 % IV BOLUS
125.0000 mL | Freq: Once | INTRAVENOUS | Status: AC
Start: 2020-07-23 — End: 2020-07-23
  Administered 2020-07-23: 125 mL via INTRAVENOUS

## 2020-07-23 MED ORDER — MORPHINE SULFATE (PF) 4 MG/ML IV SOLN
INTRAVENOUS | Status: AC
  Administered 2020-07-23: 8 mg via INTRAVENOUS
  Filled 2020-07-23: qty 2

## 2020-07-23 MED ORDER — LIDOCAINE HCL (PF) 1 % IJ SOLN
INTRAMUSCULAR | Status: AC
  Filled 2020-07-23: qty 30

## 2020-07-23 MED ORDER — OXYCODONE HCL 5 MG PO TABS
10.0000 mg | ORAL_TABLET | ORAL | Status: DC | PRN
  Administered 2020-07-23 – 2020-07-24 (×3): 10 mg via ORAL
  Filled 2020-07-23 (×4): qty 2

## 2020-07-23 MED ORDER — MORPHINE SULFATE (PF) 2 MG/ML IV SOLN: 2.0000 mg | INTRAVENOUS | Status: DC | PRN

## 2020-07-23 MED ORDER — IOHEXOL 300 MG/ML  SOLN
100.0000 mL | Freq: Once | INTRAMUSCULAR | Status: AC | PRN
  Administered 2020-07-23: 100 mL via INTRAVENOUS

## 2020-07-23 MED ORDER — METOPROLOL TARTRATE 5 MG/5ML IV SOLN: 5.0000 mg | Freq: Four times a day (QID) | INTRAVENOUS | Status: DC | PRN

## 2020-07-23 MED ORDER — ONDANSETRON HCL 4 MG/2ML IJ SOLN
4.0000 mg | Freq: Four times a day (QID) | INTRAMUSCULAR | Status: DC | PRN
  Administered 2020-07-23 – 2020-07-24 (×2): 4 mg via INTRAVENOUS
  Filled 2020-07-23 (×2): qty 2

## 2020-07-23 MED ORDER — IOHEXOL 350 MG/ML SOLN
75.0000 mL | Freq: Once | INTRAVENOUS | Status: AC | PRN
  Administered 2020-07-23: 75 mL via INTRAVENOUS

## 2020-07-23 MED ORDER — MORPHINE SULFATE (PF) 4 MG/ML IV SOLN
4.0000 mg | Freq: Once | INTRAVENOUS | Status: AC
  Administered 2020-07-23: 4 mg via INTRAVENOUS
  Filled 2020-07-23: qty 1

## 2020-07-23 MED ORDER — BISACODYL 10 MG RE SUPP: 10.0000 mg | Freq: Every day | RECTAL | Status: DC | PRN

## 2020-07-23 MED ORDER — ACETAMINOPHEN 325 MG PO TABS
650.0000 mg | ORAL_TABLET | Freq: Four times a day (QID) | ORAL | Status: DC
  Administered 2020-07-23 – 2020-07-24 (×2): 650 mg via ORAL
  Filled 2020-07-23 (×2): qty 2

## 2020-07-23 MED ORDER — METHOCARBAMOL 1000 MG/10ML IJ SOLN
500.0000 mg | Freq: Three times a day (TID) | INTRAVENOUS | Status: DC
  Administered 2020-07-23 – 2020-07-24 (×3): 500 mg via INTRAVENOUS
  Filled 2020-07-23 (×3): qty 5

## 2020-07-23 MED ORDER — HYDRALAZINE HCL 20 MG/ML IJ SOLN
10.0000 mg | INTRAMUSCULAR | Status: DC | PRN
  Filled 2020-07-23: qty 1

## 2020-07-23 MED ORDER — OXYCODONE HCL 5 MG PO TABS: 5.0000 mg | ORAL_TABLET | ORAL | Status: DC | PRN

## 2020-07-23 MED ORDER — ONDANSETRON 4 MG PO TBDP: 4.0000 mg | ORAL_TABLET | Freq: Four times a day (QID) | ORAL | Status: DC | PRN

## 2020-07-23 MED ORDER — LIDOCAINE-EPINEPHRINE 1 %-1:100000 IJ SOLN
20.0000 mL | Freq: Once | INTRAMUSCULAR | Status: AC
  Administered 2020-07-23: 20 mL

## 2020-07-23 MED ORDER — LEVETIRACETAM IN NACL 500 MG/100ML IV SOLN
500.0000 mg | Freq: Two times a day (BID) | INTRAVENOUS | Status: DC
  Administered 2020-07-23 – 2020-07-25 (×4): 500 mg via INTRAVENOUS
  Filled 2020-07-23 (×4): qty 100

## 2020-07-23 MED ORDER — TETANUS-DIPHTH-ACELL PERTUSSIS 5-2.5-18.5 LF-MCG/0.5 IM SUSY
0.5000 mL | PREFILLED_SYRINGE | Freq: Once | INTRAMUSCULAR | Status: AC
  Administered 2020-07-23: 0.5 mL via INTRAMUSCULAR

## 2020-07-23 MED ORDER — BACITRACIN ZINC 500 UNIT/GM EX OINT
TOPICAL_OINTMENT | Freq: Every day | CUTANEOUS | Status: DC
  Filled 2020-07-23: qty 28.4

## 2020-07-23 MED ORDER — CEFAZOLIN SODIUM-DEXTROSE 2-4 GM/100ML-% IV SOLN
2.0000 g | Freq: Once | INTRAVENOUS | Status: AC
  Administered 2020-07-23: 2 g via INTRAVENOUS

## 2020-07-23 MED ORDER — PANTOPRAZOLE SODIUM 40 MG PO TBEC
40.0000 mg | DELAYED_RELEASE_TABLET | Freq: Every day | ORAL | Status: DC
  Administered 2020-07-24 – 2020-07-26 (×3): 40 mg via ORAL
  Filled 2020-07-23 (×4): qty 1

## 2020-07-23 NOTE — ED Notes (Signed)
Patient transported to X-ray from CT
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
## 2020-07-23 NOTE — ED Notes (Signed)
Ancef bag started
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
## 2020-07-23 NOTE — ED Notes (Signed)
Pt received 1000 ml bolus per EDP, order put in incorrectly

## 2020-07-23 NOTE — Progress Notes (Signed)
Dr. Ninfa Linden said she did not need surgery. Therefore, she is allowed a clear liquid diet per Dr. Bobbye Morton.

## 2020-07-23 NOTE — ED Notes (Signed)
EDP notified of pt condition

## 2020-07-23 NOTE — ED Notes (Signed)
RN notified about vitals 

## 2020-07-23 NOTE — Progress Notes (Signed)
Noted stable head CT

## 2020-07-24 ENCOUNTER — Inpatient Hospital Stay (HOSPITAL_COMMUNITY): Payer: Medicaid Other

## 2020-07-24 ENCOUNTER — Encounter: Payer: Medicaid Other | Admitting: Physical Therapy

## 2020-07-24 DIAGNOSIS — S065X9A Traumatic subdural hemorrhage with loss of consciousness of unspecified duration, initial encounter: Principal | ICD-10-CM

## 2020-07-24 DIAGNOSIS — D62 Acute posthemorrhagic anemia: Secondary | ICD-10-CM

## 2020-07-24 DIAGNOSIS — M25561 Pain in right knee: Secondary | ICD-10-CM

## 2020-07-24 DIAGNOSIS — S069XAA Unspecified intracranial injury with loss of consciousness status unknown, initial encounter: Secondary | ICD-10-CM

## 2020-07-24 DIAGNOSIS — S065XAA Traumatic subdural hemorrhage with loss of consciousness status unknown, initial encounter: Secondary | ICD-10-CM

## 2020-07-24 DIAGNOSIS — S069X0A Unspecified intracranial injury without loss of consciousness, initial encounter: Secondary | ICD-10-CM | POA: Diagnosis not present

## 2020-07-24 DIAGNOSIS — N179 Acute kidney failure, unspecified: Secondary | ICD-10-CM

## 2020-07-24 DIAGNOSIS — S27322A Contusion of lung, bilateral, initial encounter: Secondary | ICD-10-CM

## 2020-07-24 DIAGNOSIS — M25562 Pain in left knee: Secondary | ICD-10-CM

## 2020-07-24 DIAGNOSIS — S069X9A Unspecified intracranial injury with loss of consciousness of unspecified duration, initial encounter: Secondary | ICD-10-CM

## 2020-07-24 DIAGNOSIS — R000 Tachycardia, unspecified: Secondary | ICD-10-CM

## 2020-07-24 LAB — CBC
HCT: 30.2 % — ABNORMAL LOW (ref 36.0–46.0)
Hemoglobin: 9.9 g/dL — ABNORMAL LOW (ref 12.0–15.0)
MCH: 30.9 pg (ref 26.0–34.0)
MCHC: 32.8 g/dL (ref 30.0–36.0)
MCV: 94.4 fL (ref 80.0–100.0)
Platelets: 191 10*3/uL (ref 150–400)
RBC: 3.2 MIL/uL — ABNORMAL LOW (ref 3.87–5.11)
RDW: 16.1 % — ABNORMAL HIGH (ref 11.5–15.5)
WBC: 9.3 10*3/uL (ref 4.0–10.5)
nRBC: 0 % (ref 0.0–0.2)

## 2020-07-24 LAB — BASIC METABOLIC PANEL
Anion gap: 11 (ref 5–15)
BUN: 12 mg/dL (ref 6–20)
CO2: 20 mmol/L — ABNORMAL LOW (ref 22–32)
Calcium: 9.2 mg/dL (ref 8.9–10.3)
Chloride: 105 mmol/L (ref 98–111)
Creatinine, Ser: 1.1 mg/dL — ABNORMAL HIGH (ref 0.44–1.00)
GFR, Estimated: >60 mL/min (ref >60)
Glucose, Bld: 120 mg/dL — ABNORMAL HIGH (ref 70–99)
Potassium: 3.8 mmol/L (ref 3.5–5.1)
Sodium: 136 mmol/L (ref 135–145)

## 2020-07-24 LAB — BPAM RBC
Blood Product Expiration Date: 202203302359
ISSUE DATE / TIME: 202203081442
Unit Type and Rh: 5100

## 2020-07-24 LAB — TYPE AND SCREEN
ABO/RH(D): B POS
Antibody Screen: NEGATIVE
Unit division: 0

## 2020-07-24 MED ORDER — SODIUM CHLORIDE 0.9 % IV BOLUS
1500.0000 mL | Freq: Once | INTRAVENOUS | Status: AC
  Administered 2020-07-24: 1500 mL via INTRAVENOUS

## 2020-07-24 MED ORDER — AMLODIPINE BESYLATE 10 MG PO TABS
10.0000 mg | ORAL_TABLET | Freq: Every day | ORAL | Status: DC
  Administered 2020-07-25 – 2020-07-26 (×2): 10 mg via ORAL
  Filled 2020-07-24 (×3): qty 1

## 2020-07-24 MED ORDER — IOHEXOL 300 MG/ML  SOLN
100.0000 mL | Freq: Once | INTRAMUSCULAR | Status: AC | PRN
  Administered 2020-07-24: 100 mL via INTRAVENOUS

## 2020-07-24 MED ORDER — METHOCARBAMOL 500 MG PO TABS
1000.0000 mg | ORAL_TABLET | Freq: Three times a day (TID) | ORAL | Status: DC
  Administered 2020-07-24 – 2020-07-26 (×5): 1000 mg via ORAL
  Filled 2020-07-24 (×5): qty 2

## 2020-07-24 MED ORDER — OXYCODONE HCL 5 MG PO TABS
10.0000 mg | ORAL_TABLET | ORAL | Status: DC | PRN
  Administered 2020-07-25: 10 mg via ORAL
  Administered 2020-07-25: 15 mg via ORAL
  Administered 2020-07-26: 10 mg via ORAL
  Filled 2020-07-24: qty 3
  Filled 2020-07-24 (×2): qty 2

## 2020-07-24 MED ORDER — METHOCARBAMOL 1000 MG/10ML IJ SOLN: 1000.0000 mg | Freq: Three times a day (TID) | INTRAVENOUS | Status: DC

## 2020-07-24 MED ORDER — SODIUM CHLORIDE 0.9 % IV SOLN: INTRAVENOUS | Status: DC

## 2020-07-24 MED ORDER — CHLORHEXIDINE GLUCONATE CLOTH 2 % EX PADS
6.0000 | MEDICATED_PAD | Freq: Every day | CUTANEOUS | Status: DC
  Administered 2020-07-24: 6 via TOPICAL

## 2020-07-24 MED ORDER — ACETAMINOPHEN 500 MG PO TABS
1000.0000 mg | ORAL_TABLET | Freq: Four times a day (QID) | ORAL | Status: DC
  Administered 2020-07-24 – 2020-07-26 (×6): 1000 mg via ORAL
  Filled 2020-07-24 (×6): qty 2

## 2020-07-24 MED ORDER — OXYCODONE HCL 5 MG/5ML PO SOLN
10.0000 mg | ORAL | Status: DC | PRN
  Administered 2020-07-24: 15 mg via ORAL
  Filled 2020-07-24: qty 15

## 2020-07-24 MED ORDER — KETOROLAC TROMETHAMINE 15 MG/ML IJ SOLN
15.0000 mg | Freq: Four times a day (QID) | INTRAMUSCULAR | Status: AC
  Administered 2020-07-24 – 2020-07-25 (×4): 15 mg via INTRAVENOUS
  Filled 2020-07-24 (×4): qty 1

## 2020-07-24 MED ORDER — DEXAMETHASONE SODIUM PHOSPHATE 4 MG/ML IJ SOLN
4.0000 mg | INTRAMUSCULAR | Status: AC
  Administered 2020-07-24 – 2020-07-25 (×6): 4 mg via INTRAVENOUS
  Filled 2020-07-24 (×7): qty 1

## 2020-07-24 MED ORDER — TRAMADOL HCL 50 MG PO TABS
50.0000 mg | ORAL_TABLET | Freq: Four times a day (QID) | ORAL | Status: DC
  Administered 2020-07-25 – 2020-07-26 (×5): 50 mg via ORAL
  Filled 2020-07-24 (×6): qty 1

## 2020-07-24 MED ORDER — LORAZEPAM 2 MG/ML IJ SOLN
0.5000 mg | Freq: Once | INTRAMUSCULAR | Status: AC
  Administered 2020-07-24: 0.5 mg via INTRAVENOUS
  Filled 2020-07-24: qty 1

## 2020-07-24 MED ORDER — OXYCODONE HCL 5 MG/5ML PO SOLN: 10.0000 mg | ORAL | Status: DC | PRN

## 2020-07-24 NOTE — Progress Notes (Signed)
Called to room by PT/OT, pt saying she is having trouble breathing and that it is hard to swallow.  Dr. Bobbye Morton called and ordered Ativan 0.5 mg IV once for anxiety.

## 2020-07-24 NOTE — Progress Notes (Addendum)
   Providing Compassionate, Quality Care - Together  NEUROSURGERY PROGRESS NOTE   S: No issues overnight.  Complains of pain "everywhere"  O: EXAM:  BP 102/78   Pulse (!) 110   Temp 98.1 F (36.7 C) (Oral)   Resp 11   Ht 5\' 7"  (1.702 m)   Wt 92.4 kg   SpO2 97%   BMI 31.90 kg/m   Awake, alert, oriented  Right pupil equally round reactive to light, left eyelid swollen shut Scalp laceration status post repair Speech fluent, appropriate  CNs grossly intact  Symmetric power BUE/BLE, somewhat limited by pain Cervical collar in place  ASSESSMENT:  50 y.o. female with  1.  Traumatic left subdural hematoma, without mass-effect 2.  Right C1 lateral mass fracture  PLAN: -Repeat CT brain stable, okay for DVT prophylaxis, prefer subcu heparin -Continue Keppra x7 days, no acute neurosurgical intervention needed for subdural hematoma -Continue cervical collar 23 hours/day, can remove for showering and eating only -We will continue to follow cervical fracture in the outpatient setting with x-rays -Pain control -CTA neck negative for vascular injury   Thank you for allowing me to participate in this patient's care.  Please do not hesitate to call with questions or concerns.   Elwin Sleight, [facility] Cell: [phone]

## 2020-07-24 NOTE — Procedures (Addendum)
   Operative Note   Date: 07/23/2020  Procedure: complex repair of scalp laceration with degloving component, 10cm x 5cm x 1cm  Pre-op diagnosis: complex scalp laceration Post-op diagnosis: same  Indication and clinical history: The patient is a 50 y.o. year old female with a complex scalp laceration     Surgeon: Jesusita Oka, MD  Anesthesia: local, lidocaine with epi  Findings:  . Specimen: none . EBL: 15cc . Drains/Implants: none  Description of procedure: The patient was positioned semi-recumbent. Local anesthetic was infiltrated using lidocaine with epinephrine. A minimal amount of per-wound hair was shaved. The wound was copiously irrigated with 1.5L of saline and hematoma expressed from the degloved portion of the wound. The wound was hemostatic. The degloved portion of the wound was tacked down with 3-0 vicryl suture and the would reapproximated with 3-0 vicryl sutures in an interrupted fashion. The skin was then reapproximated with 4-0 chromic gut suture in an interrupted fashion. There were no complications.    Jesusita Oka, MD General and [facility]
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
## 2020-07-24 NOTE — Progress Notes (Signed)
Rehab Admissions Coordinator Note:  Patient was screened by Cleatrice Burke for appropriateness for an Inpatient Acute Rehab Consult per therapy recs.  At this time, we are recommending Inpatient Rehab consult. I will place order per protocol for assessment.  Cleatrice Burke RN MSN 07/24/2020, 1:07 PM  I can be reached at [phone].
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
## 2020-07-25 LAB — BLOOD PRODUCT ORDER (VERBAL) VERIFICATION

## 2020-07-25 MED ORDER — ENOXAPARIN SODIUM 30 MG/0.3ML ~~LOC~~ SOLN
30.0000 mg | Freq: Two times a day (BID) | SUBCUTANEOUS | Status: DC
  Administered 2020-07-25 – 2020-07-26 (×3): 30 mg via SUBCUTANEOUS
  Filled 2020-07-25 (×3): qty 0.3

## 2020-07-25 MED ORDER — LEVETIRACETAM 500 MG PO TABS
500.0000 mg | ORAL_TABLET | Freq: Two times a day (BID) | ORAL | Status: DC
  Administered 2020-07-25 – 2020-07-26 (×2): 500 mg via ORAL
  Filled 2020-07-25 (×2): qty 1
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
## 2020-07-25 NOTE — Progress Notes (Signed)
Patient arrived to unit at 1836. VSS, no pain at this time. Patient respiratory and cardiovascular WNL. Patient in bed with call light in reach and verbalizes, she will call for help.
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
## 2020-07-25 NOTE — Progress Notes (Signed)
Patient ID: Elizabeth Bush, female   DOB: 10/14/1970, 50 y.o.   MRN: [id number] I told to [name] at the bedside about her left-sided pelvic/acetabular fracture as well as the laceration over her left knee.  We discussed the significant arthritis she has with the right knee.  I did remove the dressing from her left knee and that small laceration repair looks good.  I placed a new small dressing.  From a mobility standpoint and therapy standpoint, she can weight-bear as tolerated on her right lower extremity.  She should touchdown weight-bear only on the left lower extremity.  She would likely benefit from posterior hip precautions on the left side as well.

## 2020-07-25 NOTE — TOC CAGE-AID Note (Signed)
Transition of Care [facility]) - CAGE-AID Screening   Patient Details  Name: Elizabeth Bush MRN: [id number] Date of Birth: 09-Feb-1971   Dia Crawford, RN Phone Number: 07/25/2020, 11:32 AM   Clinical Narrative:  Pt admits to occasional tobacco and etoh usage, denies drugs.  Pt states she does not require referral or resources at this time, she does not feel like her intake is an issue.   CAGE-AID Screening:    Have You Ever Felt You Ought to Cut Down on Your Drinking or Drug Use?: No Have People Annoyed You By Critizing Your Drinking Or Drug Use?: No Have You Felt Bad Or Guilty About Your Drinking Or Drug Use?: No Have You Ever Had a Drink or Used Drugs First Thing In The Morning to Steady Your Nerves or to Get Rid of a Hangover?: No CAGE-AID Score: 0  Substance Abuse Education Offered: Yes  Substance abuse interventions: Patient Counseling
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
## 2020-07-25 NOTE — Discharge Instructions (Signed)
Touchdown weightbearing only on the left lower extremity with posterior hip precautions. You may get your incision wet daily of your left knee and then place a large Band-Aid over the incision daily to protect the sutures. You may weight-bear as tolerated on your right lower extremity. Expect bilateral knee swelling -ice and elevation periodically as needed.

## 2020-07-25 NOTE — Progress Notes (Signed)
IP rehab admissions - I met with patient and her daughter at the bedside.  Patient lives alone but has a boyfriend who can help and can provide physical assistance.  Patient is willing to come to CIR if needed prior to home discharge.  I will open the case and request authorization for CIR admission from insurance carrier.  I will update all once I hear back from case manager.  Call for questions.  [phone]

## 2020-07-26 ENCOUNTER — Inpatient Hospital Stay (HOSPITAL_COMMUNITY)
Admission: RE | Admit: 2020-07-26 | Discharge: 2020-08-08 | DRG: 945 | Disposition: A | Discharge status: Home Health | Payer: Medicaid Other | Source: Intra-hospital | Attending: Physical Medicine & Rehabilitation | Admitting: Physical Medicine & Rehabilitation

## 2020-07-26 ENCOUNTER — Encounter (HOSPITAL_COMMUNITY): Payer: Self-pay

## 2020-07-26 ENCOUNTER — Encounter (HOSPITAL_COMMUNITY): Payer: Self-pay | Admitting: Physical Medicine & Rehabilitation

## 2020-07-26 DIAGNOSIS — R52 Pain, unspecified: Secondary | ICD-10-CM

## 2020-07-26 DIAGNOSIS — S065X1S Traumatic subdural hemorrhage with loss of consciousness of 30 minutes or less, sequela: Secondary | ICD-10-CM | POA: Diagnosis not present

## 2020-07-26 DIAGNOSIS — D62 Acute posthemorrhagic anemia: Secondary | ICD-10-CM | POA: Diagnosis present

## 2020-07-26 DIAGNOSIS — K219 Gastro-esophageal reflux disease without esophagitis: Secondary | ICD-10-CM | POA: Diagnosis present

## 2020-07-26 DIAGNOSIS — S065X9D Traumatic subdural hemorrhage with loss of consciousness of unspecified duration, subsequent encounter: Principal | ICD-10-CM

## 2020-07-26 DIAGNOSIS — Z801 Family history of malignant neoplasm of trachea, bronchus and lung: Secondary | ICD-10-CM | POA: Diagnosis not present

## 2020-07-26 DIAGNOSIS — S32422D Displaced fracture of posterior wall of left acetabulum, subsequent encounter for fracture with routine healing: Secondary | ICD-10-CM

## 2020-07-26 DIAGNOSIS — S065X0S Traumatic subdural hemorrhage without loss of consciousness, sequela: Secondary | ICD-10-CM | POA: Diagnosis not present

## 2020-07-26 DIAGNOSIS — M79605 Pain in left leg: Secondary | ICD-10-CM

## 2020-07-26 DIAGNOSIS — Z833 Family history of diabetes mellitus: Secondary | ICD-10-CM

## 2020-07-26 DIAGNOSIS — R4182 Altered mental status, unspecified: Secondary | ICD-10-CM | POA: Diagnosis not present

## 2020-07-26 DIAGNOSIS — S0101XD Laceration without foreign body of scalp, subsequent encounter: Secondary | ICD-10-CM

## 2020-07-26 DIAGNOSIS — Z7982 Long term (current) use of aspirin: Secondary | ICD-10-CM

## 2020-07-26 DIAGNOSIS — F17210 Nicotine dependence, cigarettes, uncomplicated: Secondary | ICD-10-CM | POA: Diagnosis present

## 2020-07-26 DIAGNOSIS — S069X0D Unspecified intracranial injury without loss of consciousness, subsequent encounter: Secondary | ICD-10-CM | POA: Diagnosis not present

## 2020-07-26 DIAGNOSIS — S32442D Displaced fracture of posterior column [ilioischial] of left acetabulum, subsequent encounter for fracture with routine healing: Secondary | ICD-10-CM | POA: Diagnosis not present

## 2020-07-26 DIAGNOSIS — I10 Essential (primary) hypertension: Secondary | ICD-10-CM

## 2020-07-26 DIAGNOSIS — M7989 Other specified soft tissue disorders: Secondary | ICD-10-CM | POA: Diagnosis not present

## 2020-07-26 DIAGNOSIS — T148XXD Other injury of unspecified body region, subsequent encounter: Secondary | ICD-10-CM

## 2020-07-26 DIAGNOSIS — F32A Depression, unspecified: Secondary | ICD-10-CM | POA: Diagnosis not present

## 2020-07-26 DIAGNOSIS — Z96652 Presence of left artificial knee joint: Secondary | ICD-10-CM | POA: Diagnosis present

## 2020-07-26 DIAGNOSIS — K5903 Drug induced constipation: Secondary | ICD-10-CM | POA: Diagnosis present

## 2020-07-26 DIAGNOSIS — Z8249 Family history of ischemic heart disease and other diseases of the circulatory system: Secondary | ICD-10-CM

## 2020-07-26 DIAGNOSIS — T07XXXA Unspecified multiple injuries, initial encounter: Secondary | ICD-10-CM | POA: Diagnosis not present

## 2020-07-26 DIAGNOSIS — Z885 Allergy status to narcotic agent status: Secondary | ICD-10-CM

## 2020-07-26 DIAGNOSIS — Z888 Allergy status to other drugs, medicaments and biological substances status: Secondary | ICD-10-CM | POA: Diagnosis not present

## 2020-07-26 DIAGNOSIS — S63501D Unspecified sprain of right wrist, subsequent encounter: Secondary | ICD-10-CM | POA: Diagnosis not present

## 2020-07-26 DIAGNOSIS — M1990 Unspecified osteoarthritis, unspecified site: Secondary | ICD-10-CM | POA: Diagnosis not present

## 2020-07-26 DIAGNOSIS — S065XAA Traumatic subdural hemorrhage with loss of consciousness status unknown, initial encounter: Secondary | ICD-10-CM | POA: Diagnosis present

## 2020-07-26 DIAGNOSIS — Z79899 Other long term (current) drug therapy: Secondary | ICD-10-CM | POA: Diagnosis not present

## 2020-07-26 DIAGNOSIS — S065X0A Traumatic subdural hemorrhage without loss of consciousness, initial encounter: Secondary | ICD-10-CM | POA: Diagnosis not present

## 2020-07-26 DIAGNOSIS — Z298 Encounter for other specified prophylactic measures: Secondary | ICD-10-CM

## 2020-07-26 DIAGNOSIS — S12040D Displaced lateral mass fracture of first cervical vertebra, subsequent encounter for fracture with routine healing: Secondary | ICD-10-CM

## 2020-07-26 DIAGNOSIS — F419 Anxiety disorder, unspecified: Secondary | ICD-10-CM | POA: Diagnosis present

## 2020-07-26 DIAGNOSIS — Z823 Family history of stroke: Secondary | ICD-10-CM | POA: Diagnosis not present

## 2020-07-26 DIAGNOSIS — S065X9A Traumatic subdural hemorrhage with loss of consciousness of unspecified duration, initial encounter: Secondary | ICD-10-CM | POA: Diagnosis present

## 2020-07-26 DIAGNOSIS — Z2989 Encounter for other specified prophylactic measures: Secondary | ICD-10-CM

## 2020-07-26 DIAGNOSIS — S065X0D Traumatic subdural hemorrhage without loss of consciousness, subsequent encounter: Secondary | ICD-10-CM | POA: Diagnosis not present

## 2020-07-26 DIAGNOSIS — Z7409 Other reduced mobility: Secondary | ICD-10-CM | POA: Diagnosis not present

## 2020-07-26 LAB — CBC
HCT: 21.3 % — ABNORMAL LOW (ref 36.0–46.0)
Hemoglobin: 7 g/dL — ABNORMAL LOW (ref 12.0–15.0)
MCH: 30.8 pg (ref 26.0–34.0)
MCHC: 32.9 g/dL (ref 30.0–36.0)
MCV: 93.8 fL (ref 80.0–100.0)
Platelets: 180 10*3/uL (ref 150–400)
RBC: 2.27 MIL/uL — ABNORMAL LOW (ref 3.87–5.11)
RDW: 15.1 % (ref 11.5–15.5)
WBC: 9.4 10*3/uL (ref 4.0–10.5)
nRBC: 0 % (ref 0.0–0.2)

## 2020-07-26 LAB — BASIC METABOLIC PANEL
Anion gap: 8 (ref 5–15)
BUN: 13 mg/dL (ref 6–20)
CO2: 20 mmol/L — ABNORMAL LOW (ref 22–32)
Calcium: 8.5 mg/dL — ABNORMAL LOW (ref 8.9–10.3)
Chloride: 107 mmol/L (ref 98–111)
Creatinine, Ser: 0.77 mg/dL (ref 0.44–1.00)
GFR, Estimated: >60 mL/min (ref >60)
Glucose, Bld: 124 mg/dL — ABNORMAL HIGH (ref 70–99)
Potassium: 3.8 mmol/L (ref 3.5–5.1)
Sodium: 135 mmol/L (ref 135–145)

## 2020-07-26 MED ORDER — DOCUSATE SODIUM 100 MG PO CAPS
100.0000 mg | ORAL_CAPSULE | Freq: Two times a day (BID) | ORAL | Status: DC
  Administered 2020-07-26 – 2020-07-27 (×2): 100 mg via ORAL
  Filled 2020-07-26 (×2): qty 1

## 2020-07-26 MED ORDER — TRAMADOL HCL 50 MG PO TABS
50.0000 mg | ORAL_TABLET | Freq: Four times a day (QID) | ORAL | Status: DC
Start: 2020-07-26 — End: 2020-08-08
  Administered 2020-07-26 – 2020-08-08 (×51): 50 mg via ORAL
  Filled 2020-07-26 (×51): qty 1

## 2020-07-26 MED ORDER — PANTOPRAZOLE SODIUM 40 MG PO TBEC
40.0000 mg | DELAYED_RELEASE_TABLET | Freq: Every day | ORAL | Status: DC
  Administered 2020-07-27 – 2020-08-08 (×13): 40 mg via ORAL
  Filled 2020-07-26 (×13): qty 1

## 2020-07-26 MED ORDER — POLYETHYLENE GLYCOL 3350 17 G PO PACK
17.0000 g | PACK | Freq: Every day | ORAL | Status: DC
  Administered 2020-07-26: 17 g via ORAL
  Filled 2020-07-26: qty 1

## 2020-07-26 MED ORDER — ENOXAPARIN SODIUM 30 MG/0.3ML ~~LOC~~ SOLN
30.0000 mg | Freq: Two times a day (BID) | SUBCUTANEOUS | Status: DC
  Administered 2020-07-26 – 2020-08-07 (×25): 30 mg via SUBCUTANEOUS
  Filled 2020-07-26 (×25): qty 0.3

## 2020-07-26 MED ORDER — OXYCODONE HCL 5 MG PO TABS
10.0000 mg | ORAL_TABLET | ORAL | Status: DC | PRN
  Administered 2020-07-26: 10 mg via ORAL
  Administered 2020-07-27 – 2020-07-31 (×13): 15 mg via ORAL
  Administered 2020-07-31: 10 mg via ORAL
  Administered 2020-08-01: 15 mg via ORAL
  Filled 2020-07-26 (×3): qty 3
  Filled 2020-07-26: qty 2
  Filled 2020-07-26 (×10): qty 3
  Filled 2020-07-26: qty 2
  Filled 2020-07-26: qty 3

## 2020-07-26 MED ORDER — BISACODYL 10 MG RE SUPP: 10.0000 mg | Freq: Every day | RECTAL | Status: DC | PRN

## 2020-07-26 MED ORDER — MORPHINE SULFATE (PF) 2 MG/ML IV SOLN: 2.0000 mg | Freq: Three times a day (TID) | INTRAVENOUS | Status: DC | PRN

## 2020-07-26 MED ORDER — ACETAMINOPHEN 325 MG PO TABS
325.0000 mg | ORAL_TABLET | ORAL | Status: DC | PRN
  Administered 2020-07-30 – 2020-08-07 (×4): 650 mg via ORAL
  Filled 2020-07-26 (×4): qty 2

## 2020-07-26 MED ORDER — ENOXAPARIN SODIUM 30 MG/0.3ML ~~LOC~~ SOLN: 30.0000 mg | Freq: Two times a day (BID) | SUBCUTANEOUS | Status: DC

## 2020-07-26 MED ORDER — MORPHINE SULFATE (PF) 2 MG/ML IV SOLN: 2.0000 mg | INTRAVENOUS | Status: DC | PRN

## 2020-07-26 MED ORDER — ONDANSETRON 4 MG PO TBDP: 4.0000 mg | ORAL_TABLET | Freq: Four times a day (QID) | ORAL | Status: DC | PRN

## 2020-07-26 MED ORDER — METHOCARBAMOL 500 MG PO TABS
1000.0000 mg | ORAL_TABLET | Freq: Three times a day (TID) | ORAL | Status: DC
  Administered 2020-07-26 – 2020-08-08 (×38): 1000 mg via ORAL
  Filled 2020-07-26 (×38): qty 2

## 2020-07-26 MED ORDER — BACITRACIN ZINC 500 UNIT/GM EX OINT
TOPICAL_OINTMENT | Freq: Every day | CUTANEOUS | Status: DC
  Administered 2020-07-27: 1 via TOPICAL
  Filled 2020-07-26 (×3): qty 28.35

## 2020-07-26 MED ORDER — AMLODIPINE BESYLATE 10 MG PO TABS
10.0000 mg | ORAL_TABLET | Freq: Every day | ORAL | Status: DC
  Administered 2020-07-27 – 2020-08-08 (×13): 10 mg via ORAL
  Filled 2020-07-26 (×13): qty 1

## 2020-07-26 MED ORDER — LEVETIRACETAM 500 MG PO TABS
500.0000 mg | ORAL_TABLET | Freq: Two times a day (BID) | ORAL | Status: AC
  Administered 2020-07-26 – 2020-07-30 (×8): 500 mg via ORAL
  Filled 2020-07-26 (×8): qty 1

## 2020-07-26 MED ORDER — POLYETHYLENE GLYCOL 3350 17 G PO PACK
17.0000 g | PACK | Freq: Every day | ORAL | Status: DC
  Administered 2020-07-27: 17 g via ORAL
  Filled 2020-07-26: qty 1

## 2020-07-26 MED ORDER — ONDANSETRON HCL 4 MG/2ML IJ SOLN: 4.0000 mg | Freq: Four times a day (QID) | INTRAMUSCULAR | Status: DC | PRN

## 2020-07-26 NOTE — TOC Transition Note (Signed)
Transition of Care [facility]) - CM/SW Discharge Note   Patient Details  Name: Elizabeth Bush MRN: [id number] Date of Birth: 1970/06/17  [facility] CM/SW Contact:  Ella Bodo, RN Phone Number: 07/26/2020, 1:24 PM   Clinical Narrative:   Pt medically stable for discharge, and has been accepted for admission to [facility] IP Rehab today.  Plan dc to CIR upon bed availability.     Final next level of care: IP Rehab Facility Barriers to Discharge: Barriers Resolved   Patient Goals and CMS Choice Patient states their goals for this hospitalization and ongoing recovery are:: to go home CMS Medicare.gov Compare Post Acute Care list provided to:: Patient Choice offered to / list presented to : Patient                        Discharge Plan and Services   Discharge Planning Services: CM Consult Post Acute Care Choice: IP Rehab                               Social Determinants of Health (SDOH) Interventions     Readmission Risk Interventions No flowsheet data found.  Reinaldo Raddle, RN, BSN  Trauma/Neuro ICU Case Manager [phone]
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
## 2020-07-26 NOTE — Progress Notes (Signed)
Patient ID: Elizabeth Bush, female   DOB: 09-Feb-1971, 50 y.o.   MRN: [id number] Pt arrived to 4W11 via wheelchair by staff. Pt alert and oriented x4, in no distress, breathing even and unlabored, no concerns at this time.
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
## 2020-07-26 NOTE — Progress Notes (Signed)
   Providing Compassionate, Quality Care - Together  NEUROSURGERY PROGRESS NOTE   S: No issues overnight. Has right sided neck pain  O: EXAM:  BP 133/82 (BP Location: Left Arm)   Pulse 88   Temp 97.6 F (36.4 C) (Oral)   Resp 18   Ht 5\' 7"  (1.702 m)   Wt 92.4 kg   SpO2 99%   BMI 31.90 kg/m   Awake, alert, oriented x3, sitting up eating lunch Speech fluent, appropriate  Scalp lac s/p repair Collar in place MAE symmetrically   ASSESSMENT:  50 y.o. female with   1.  Traumatic left subdural hematoma, without mass-effect 2.  Right C1 lateral mass fracture  PLAN: -Repeat CT brain stable, okay for DVT prophylaxis, prefer subcu heparin -Continue Keppra x7 days, no acute neurosurgical intervention needed for subdural hematoma -Continue cervical collar 23 hours/day, can remove for showering and eating only -We will continue to follow cervical fracture in the outpatient setting with x-rays -will sign off at this time, will f/u outpt with cervical xrays for fracture    Thank you for allowing me to participate in this patient's care.  Please do not hesitate to call with questions or concerns.   Elwin Sleight, [facility] Cell: [phone]
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
## 2020-07-26 NOTE — Progress Notes (Signed)
Inpatient Rehabilitation  Patient information reviewed and entered into eRehab system by Fernando Stoiber M. Keira Bohlin, M.A., CCC/SLP, PPS Coordinator.  Information including medical coding, functional ability and quality indicators will be reviewed and updated through discharge.    

## 2020-07-26 NOTE — Progress Notes (Signed)
Inpatient Rehabilitation Medication Review by a Pharmacist  A complete drug regimen review was completed for this patient to identify any potential clinically significant medication issues.  Clinically significant medication issues were identified:  yes   Type of Medication Issue Identified Description of Issue Urgent (address now) Non-Urgent (address on AM team rounds) Plan Plan Accepted by Provider? (Yes / No / Pending AM Rounds)  Drug Interaction(s) (clinically significant)       Duplicate Therapy       Allergy       No Medication Administration End Date       Incorrect Dose       Additional Drug Therapy Needed  Cymbalta not restarted     Other         For non-urgent medication issues to be resolved on team rounds tomorrow morning a [facility] Secure Chat Handoff was sent to: Lauraine Rinne, PA   Pharmacist comments: Cymbalta not restarted since admission  Time spent performing this drug regimen review (minutes):  [place], PharmD 07/26/2020 3:09 PM  Please check [email or web address] for unit-specific pharmacy phone numbers.
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
## 2020-07-26 NOTE — Progress Notes (Signed)
Inpatient Rehab Admissions Coordinator:  There is a bed in CIR available for pt to admit today.  Trauma service aware and in agreement.  NSG, TOC, and pt made aware.   Gayland Curry, [place], [place] Admissions Coordinator [phone]
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
## 2020-07-26 NOTE — Plan of Care (Signed)
  Problem: Health Behavior/Discharge Planning: Goal: Ability to manage health-related needs will improve Outcome: Progressing   Problem: Activity: Goal: Risk for activity intolerance will decrease Outcome: Progressing   
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
## 2020-07-27 DIAGNOSIS — K5903 Drug induced constipation: Secondary | ICD-10-CM

## 2020-07-27 DIAGNOSIS — R52 Pain, unspecified: Secondary | ICD-10-CM

## 2020-07-27 DIAGNOSIS — D62 Acute posthemorrhagic anemia: Secondary | ICD-10-CM

## 2020-07-27 DIAGNOSIS — S065X0D Traumatic subdural hemorrhage without loss of consciousness, subsequent encounter: Secondary | ICD-10-CM | POA: Diagnosis not present

## 2020-07-27 DIAGNOSIS — I10 Essential (primary) hypertension: Secondary | ICD-10-CM | POA: Diagnosis not present

## 2020-07-27 LAB — CBC WITH DIFFERENTIAL/PLATELET
Abs Immature Granulocytes: 0.02 10*3/uL (ref 0.00–0.07)
Basophils Absolute: 0 10*3/uL (ref 0.0–0.1)
Basophils Relative: 0 %
Eosinophils Absolute: 0.1 10*3/uL (ref 0.0–0.5)
Eosinophils Relative: 1 %
HCT: 22.7 % — ABNORMAL LOW (ref 36.0–46.0)
Hemoglobin: 7.5 g/dL — ABNORMAL LOW (ref 12.0–15.0)
Immature Granulocytes: 0 %
Lymphocytes Relative: 33 %
Lymphs Abs: 2.1 10*3/uL (ref 0.7–4.0)
MCH: 30.9 pg (ref 26.0–34.0)
MCHC: 33 g/dL (ref 30.0–36.0)
MCV: 93.4 fL (ref 80.0–100.0)
Monocytes Absolute: 0.8 10*3/uL (ref 0.1–1.0)
Monocytes Relative: 13 %
Neutro Abs: 3.4 10*3/uL (ref 1.7–7.7)
Neutrophils Relative %: 53 %
Platelets: 207 10*3/uL (ref 150–400)
RBC: 2.43 MIL/uL — ABNORMAL LOW (ref 3.87–5.11)
RDW: 15 % (ref 11.5–15.5)
WBC: 6.3 10*3/uL (ref 4.0–10.5)
nRBC: 0 % (ref 0.0–0.2)

## 2020-07-27 MED ORDER — SENNOSIDES-DOCUSATE SODIUM 8.6-50 MG PO TABS
1.0000 | ORAL_TABLET | Freq: Every day | ORAL | Status: DC
  Administered 2020-07-27 – 2020-08-07 (×9): 1 via ORAL
  Filled 2020-07-27 (×10): qty 1

## 2020-07-27 MED ORDER — POLYETHYLENE GLYCOL 3350 17 G PO PACK
17.0000 g | PACK | Freq: Two times a day (BID) | ORAL | Status: DC
  Administered 2020-07-27 – 2020-08-08 (×13): 17 g via ORAL
  Filled 2020-07-27 (×21): qty 1

## 2020-07-27 NOTE — Progress Notes (Incomplete)
Patient is oriented x4, cooperative and able to verbalizes her needs appropriately.Verbalize pain" all over" her body,rate her pain 10/10 on pain scale medicated with Oxycodone 15 mg po, and attempted to assisted in repositioning for comfort.
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
## 2020-07-27 NOTE — Plan of Care (Signed)
  Problem: Consults Goal: RH GENERAL PATIENT EDUCATION Description: See Patient Education module for education specifics. Outcome: Progressing Goal: Skin Care Protocol Initiated - if Braden Score 18 or less Description: If consults are not indicated, leave blank or document N/A Outcome: Progressing   Problem: RH SKIN INTEGRITY Goal: RH STG SKIN FREE OF INFECTION/BREAKDOWN Outcome: Progressing Goal: RH STG MAINTAIN SKIN INTEGRITY WITH ASSISTANCE Description: STG Maintain Skin Integrity With min Assistance. Outcome: Progressing Goal: RH STG ABLE TO PERFORM INCISION/WOUND CARE W/ASSISTANCE Description: STG Able To Perform Incision/Wound Care With min Assistance. Outcome: Progressing   Problem: RH SAFETY Goal: RH STG ADHERE TO SAFETY PRECAUTIONS W/ASSISTANCE/DEVICE Description: STG Adhere to Safety Precautions With min Assistance/Device. Outcome: Progressing Goal: RH STG DECREASED RISK OF FALL WITH ASSISTANCE Description: STG Decreased Risk of Fall With min Assistance. Outcome: Progressing   Problem: RH PAIN MANAGEMENT Goal: RH STG PAIN MANAGED AT OR BELOW PT'S PAIN GOAL Description: Less than 3 out of 10 Outcome: Progressing   Problem: RH KNOWLEDGE DEFICIT GENERAL Goal: RH STG INCREASE KNOWLEDGE OF SELF CARE AFTER HOSPITALIZATION Outcome: Progressing   Problem: Consults Goal: RH BRAIN INJURY PATIENT EDUCATION Description: Description: See Patient Education module for eduction specifics Outcome: Progressing
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
## 2020-07-28 ENCOUNTER — Inpatient Hospital Stay (HOSPITAL_COMMUNITY): Payer: Medicaid Other

## 2020-07-28 DIAGNOSIS — M7989 Other specified soft tissue disorders: Secondary | ICD-10-CM

## 2020-07-28 NOTE — Plan of Care (Signed)
  Problem: RH Balance Goal: LTG Patient will maintain dynamic sitting balance (PT) Description: LTG:  Patient will maintain dynamic sitting balance with assistance during mobility activities (PT) Flowsheets (Taken 07/27/2020 1225) LTG: Pt will maintain dynamic sitting balance during mobility activities with:: Supervision/Verbal cueing   Problem: Sit to Stand Goal: LTG:  Patient will perform sit to stand with assistance level (PT) Description: LTG:  Patient will perform sit to stand with assistance level (PT) Flowsheets (Taken 07/27/2020 1225) LTG: PT will perform sit to stand in preparation for functional mobility with assistance level: Supervision/Verbal cueing   Problem: RH Bed Mobility Goal: LTG Patient will perform bed mobility with assist (PT) Description: LTG: Patient will perform bed mobility with assistance, with/without cues (PT). Flowsheets (Taken 07/27/2020 1225) LTG: Pt will perform bed mobility with assistance level of: Supervision/Verbal cueing   Problem: RH Bed to Chair Transfers Goal: LTG Patient will perform bed/chair transfers w/assist (PT) Description: LTG: Patient will perform bed to chair transfers with assistance (PT). Flowsheets (Taken 07/27/2020 1225) LTG: Pt will perform Bed to Chair Transfers with assistance level: Supervision/Verbal cueing   Problem: RH Car Transfers Goal: LTG Patient will perform car transfers with assist (PT) Description: LTG: Patient will perform car transfers with assistance (PT). Flowsheets (Taken 07/27/2020 1225) LTG: Pt will perform car transfers with assist:: Supervision/Verbal cueing   Problem: RH Furniture Transfers Goal: LTG Patient will perform furniture transfers w/assist (OT/PT) Description: LTG: Patient will perform furniture transfers  with assistance (OT/PT). Flowsheets (Taken 07/27/2020 1225) LTG: Pt will perform furniture transfers with assist:: Supervision/Verbal cueing   Problem: RH Ambulation Goal: LTG Patient will  ambulate in controlled environment (PT) Description: LTG: Patient will ambulate in a controlled environment, # of feet with assistance (PT). Flowsheets (Taken 07/27/2020 1225) LTG: Pt will ambulate in controlled environ  assist needed:: Supervision/Verbal cueing LTG: Ambulation distance in controlled environment: 100 ft with LRAD Goal: LTG Patient will ambulate in home environment (PT) Description: LTG: Patient will ambulate in home environment, # of feet with assistance (PT). Flowsheets (Taken 07/27/2020 1225) LTG: Ambulation distance in home environment: at least 50 ft using LRAD   Problem: RH Wheelchair Mobility Goal: LTG Patient will propel w/c in home environment (PT) Description: LTG: Patient will propel wheelchair in home environment, # of feet with assistance (PT). Flowsheets (Taken 07/27/2020 1225) LTG: Pt will propel w/c in home environ  assist needed:: Supervision/Verbal cueing Distance: wheelchair distance in controlled environment: 75 LTG: Propel w/c distance in home environment: at least 50 feet Note: Goal included in the case that pain mgmt does not allow for functional ambulation on d/c   Problem: RH Stairs Goal: LTG Patient will ambulate up and down stairs w/assist (PT) Description: LTG: Patient will ambulate up and down # of stairs with assistance (PT) Flowsheets (Taken 07/27/2020 1225) LTG: Pt will ambulate up/down stairs assist needed:: Contact Guard/Touching assist LTG: Pt will  ambulate up and down number of stairs: 2 steps with HR setup as per home environment

## 2020-07-28 NOTE — Progress Notes (Signed)
VASCULAR LAB    Bilateral lower extremity venous duplex has been performed.  See CV proc for preliminary results.   Donovon Micheletti, RVT 07/28/2020, 12:49 PM

## 2020-07-29 ENCOUNTER — Encounter: Payer: Medicaid Other | Admitting: Physical Therapy

## 2020-07-29 DIAGNOSIS — S065X0D Traumatic subdural hemorrhage without loss of consciousness, subsequent encounter: Secondary | ICD-10-CM | POA: Diagnosis not present

## 2020-07-29 LAB — COMPREHENSIVE METABOLIC PANEL
ALT: 23 U/L (ref 0–44)
AST: 33 U/L (ref 15–41)
Albumin: 2.6 g/dL — ABNORMAL LOW (ref 3.5–5.0)
Alkaline Phosphatase: 47 U/L (ref 38–126)
Anion gap: 8 (ref 5–15)
BUN: 5 mg/dL — ABNORMAL LOW (ref 6–20)
CO2: 22 mmol/L (ref 22–32)
Calcium: 8.7 mg/dL — ABNORMAL LOW (ref 8.9–10.3)
Chloride: 104 mmol/L (ref 98–111)
Creatinine, Ser: 0.69 mg/dL (ref 0.44–1.00)
GFR, Estimated: >60 mL/min (ref >60)
Glucose, Bld: 93 mg/dL (ref 70–99)
Potassium: 3.2 mmol/L — ABNORMAL LOW (ref 3.5–5.1)
Sodium: 134 mmol/L — ABNORMAL LOW (ref 135–145)
Total Bilirubin: 1.1 mg/dL (ref 0.3–1.2)
Total Protein: 6.8 g/dL (ref 6.5–8.1)

## 2020-07-29 LAB — CBC WITH DIFFERENTIAL/PLATELET
Abs Immature Granulocytes: 0.05 10*3/uL (ref 0.00–0.07)
Basophils Absolute: 0 10*3/uL (ref 0.0–0.1)
Basophils Relative: 0 %
Eosinophils Absolute: 0.2 10*3/uL (ref 0.0–0.5)
Eosinophils Relative: 3 %
HCT: 22.5 % — ABNORMAL LOW (ref 36.0–46.0)
Hemoglobin: 7.5 g/dL — ABNORMAL LOW (ref 12.0–15.0)
Immature Granulocytes: 1 %
Lymphocytes Relative: 34 %
Lymphs Abs: 2.3 10*3/uL (ref 0.7–4.0)
MCH: 30.7 pg (ref 26.0–34.0)
MCHC: 33.3 g/dL (ref 30.0–36.0)
MCV: 92.2 fL (ref 80.0–100.0)
Monocytes Absolute: 1.5 10*3/uL — ABNORMAL HIGH (ref 0.1–1.0)
Monocytes Relative: 22 %
Neutro Abs: 2.7 10*3/uL (ref 1.7–7.7)
Neutrophils Relative %: 40 %
Platelets: 249 10*3/uL (ref 150–400)
RBC: 2.44 MIL/uL — ABNORMAL LOW (ref 3.87–5.11)
RDW: 14.4 % (ref 11.5–15.5)
WBC: 6.8 10*3/uL (ref 4.0–10.5)
nRBC: 0 % (ref 0.0–0.2)

## 2020-07-29 MED ORDER — DULOXETINE HCL 30 MG PO CPEP
30.0000 mg | ORAL_CAPSULE | Freq: Every day | ORAL | Status: DC
  Administered 2020-07-29 – 2020-08-08 (×11): 30 mg via ORAL
  Filled 2020-07-29 (×11): qty 1

## 2020-07-29 MED ORDER — HYPROMELLOSE (GONIOSCOPIC) 2.5 % OP SOLN
1.0000 [drp] | Freq: Three times a day (TID) | OPHTHALMIC | Status: DC
  Administered 2020-07-29 – 2020-08-01 (×6): 1 [drp] via OPHTHALMIC
  Filled 2020-07-29 (×2): qty 15

## 2020-07-29 MED ORDER — POTASSIUM CHLORIDE CRYS ER 10 MEQ PO TBCR
10.0000 meq | EXTENDED_RELEASE_TABLET | Freq: Every day | ORAL | Status: DC
  Administered 2020-07-29 – 2020-08-08 (×11): 10 meq via ORAL
  Filled 2020-07-29 (×11): qty 1

## 2020-07-29 MED ORDER — BLOOD PRESSURE CONTROL BOOK
Freq: Once | Status: AC
  Filled 2020-07-29: qty 1

## 2020-07-29 NOTE — Progress Notes (Signed)
Occupational Therapy Session Note  Patient Details  Name: Elizabeth Bush MRN: [id number] Date of Birth: 1970-10-27  Today's Date: 07/29/2020 OT Individual Time: 0100-7121 OT Individual Time Calculation (min): 56 min    Short Term Goals: Week 1:  OT Short Term Goal 1 (Week 1): Pt will don LB clothing using LRAD with CGA OT Short Term Goal 2 (Week 1): Pt will don UB clothing with supervision OT Short Term Goal 3 (Week 1): Pt will demonstrate anticipatory awareness with supervision OT Short Term Goal 4 (Week 1): Pt will complete transfer to the toilet with CGA  Skilled Therapeutic Interventions/Progress Updates:    Pt in bed to start session, agreeable to working on ADL tasks.  She was not able to state any of her THR precautions and therapist had to cue her to not bend forward down to her feet when laying in the bed with HOB elevated.  Educated pt on supine to sit EOB with pillow between her knees to maintain precautions.  She was able to complete with min assist.  Worked on AE use for LB dressing EOB.  She was able to donn her pants with use of the reacher and min assist sit to stand.  Therapist assisted with TEDs after she was able to remove her gripper socks using the reacher and supervision.  She then used the sockaide with min instructional cueing for donning her gripper socks.  Next, she completed stand pivot transfer to the wheelchair with min assist using the RW with platform on the right.  Finished session with completion of oral hygiene with setup.  Call button and phone in reach with safety belt in place.    Therapy Documentation Precautions:  Precautions Precautions: Fall,Cervical,Posterior Hip Precaution Booklet Issued: Yes (comment) Precaution Comments: L posterior hip precautions Required Braces or Orthoses: Cervical Brace Cervical Brace: Hard collar Restrictions Weight Bearing Restrictions: Yes RLE Weight Bearing: Weight bearing as tolerated LLE Weight Bearing: Touchdown  weight bearing Other Position/Activity Restrictions: Per ortho note 3/10: LLE TDWB and would benefit from posterior hip precautions, education re: TDWB   Pain: Pain Assessment Pain Scale: 0-10 Pain Score: 7  Pain Type: Acute pain Pain Location: Neck Pain Orientation: Posterior Pain Descriptors / Indicators: Aching;Headache Pain Frequency: Constant Pain Onset: On-going Patients Stated Pain Goal: 5 Pain Intervention(s): Repositioned;Emotional support ADL: See Care Tool Section for some details of mobility and selfcare  Therapy/Group: Individual Therapy  Zauria Dombek OTR/L 07/29/2020, 9:01 AM
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
## 2020-07-29 NOTE — Progress Notes (Signed)
Physical Therapy Session Note  Patient Details  Name: Elizabeth Bush MRN: [id number] Date of Birth: September 09, 1970  Today's Date: 07/29/2020 PT Individual Time: 1100-1155 PT Individual Time Calculation (min): 55 min   Short Term Goals: Week 1:  PT Short Term Goal 1 (Week 1): Pt will demonstrate appropriate log roll technique for supine <>sit with CGA. PT Short Term Goal 2 (Week 1): Pt will perform STS ans SPVT transfers using RW with CGA. PT Short Term Goal 3 (Week 1): Pt will ambulate at least 30 feet with proper LLE TDWB technique using LRAD. PT Short Term Goal 4 (Week 1): Pt will maintain good standing balance with dynamic tasks and CGA for safety with use of RW.  Skilled Therapeutic Interventions/Progress Updates:    Patient received sitting up in wc, agreeable to PT. She reports 7-8/10 pain in L hip, neck and R wrist, premedicated. PT providing rest breaks, distractions and repositioning to assist with pain management. She was able to propel herself in wc x190ft with MinA and verbal cues for coordination of UE for turns. PT reviewed weight bearing precautions and posterior hip precautions with patient. She was able to recall 1/3 posterior hip precautions with cuing (unable to recall no adduction, no IR). She completed sit > stand from wc with RPFWW and ModA. Increase in assist likely due to pain. She ambulated 6ft with MinA/ModA to therapy mat. She completed x7 sit <>stand transfers with MinA from raised therapy mat with emphasis on maintaining precautions. Patient with significant difficulty maintaining TTWB on L throughout session despite multi modal cuing. She transitioned to supine with wedge with ModA to maintain precautions. Patient able to complete 3x10 quad sets L LE. She was unable to achieve any active ROM into knee/hip flex on L due to pain. R LE heel slides 3x10. She required prolonged rest breaks due to fatigue and pain. ModA to return to sitting edge of mat. PT educating patient on  importance of maintaining ROM in tolerable range to B LE to minimize swelling present and minimize risk for developing DVTs. Patient able to demonstrate B LE LAQ x10 in very small ROM. Patient ambulating 26ft back to wc with RPFWW and MinA. PT transporting patient back to her room in wc. Observing that c-collar is digging into B upper traps leaving red marks. PT alerting RN to this as well as patients pain level. She remained up in wc, seatbelt alarm on, call light within reach.   Therapy Documentation Precautions:  Precautions Precautions: Fall,Cervical,Posterior Hip Precaution Booklet Issued: Yes (comment) Precaution Comments: L posterior hip precautions Required Braces or Orthoses: Cervical Brace Cervical Brace: Hard collar (per Dr. Posey Pronto ok to remove to eat and shower) Restrictions Weight Bearing Restrictions: Yes RLE Weight Bearing: Weight bearing as tolerated LLE Weight Bearing: Touchdown weight bearing Other Position/Activity Restrictions: Per ortho note 3/10: LLE TDWB and would benefit from posterior hip precautions, education re: TDWB   Therapy/Group: Individual Therapy  Karoline Caldwell, PT, DPT, CBIS  07/29/2020, 7:45 AM
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
## 2020-07-29 NOTE — Progress Notes (Signed)
Patient ID: Elizabeth Bush, female   DOB: 29-Oct-1970, 50 y.o.   MRN: [id number] Met with the patient to review role of the nurse CM and address educational needs. Reviewed HTN and smoking related to health issues and delayed bone healing risk. Reviewed TDWB for 4-6 weeks and collar for C1 fx support per MD. Patient with issues with constipation being addressed with stool softener and laxative. Reviewed modification if needed for additional medications. Discussed collaboration with the SW to facilitate preparation for discharge and follow up services. Continue to follow along to discharge to address questions/educational needs. No other questions noted at present. Margarito Liner
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
## 2020-07-29 NOTE — Progress Notes (Signed)
Inpatient Rehabilitation Center Individual Statement of Services  Patient Name:  Elizabeth Bush  Date:  07/29/2020  Welcome to the [facility].  Our goal is to provide you with an individualized program based on your diagnosis and situation, designed to meet your specific needs.  With this comprehensive rehabilitation program, you will be expected to participate in at least 3 hours of rehabilitation therapies Monday-Friday, with modified therapy programming on the weekends.  Your rehabilitation program will include the following services:  Physical Therapy (PT), Occupational Therapy (OT), Speech Therapy (ST), 24 hour per day rehabilitation nursing, Therapeutic Recreaction (TR), Neuropsychology, Care Coordinator, Nutrition Services, Pharmacy Services and Other  Weekly team conferences will be held on Wednesdays to discuss your progress.  Your Inpatient Rehabilitation Care Coordinator will talk with you frequently to get your input and to update you on team discussions.  Team conferences with you and your family in attendance may also be held.  Expected length of stay: 16-19 Days  Overall anticipated outcome: Supervision to Min A  Depending on your progress and recovery, your program may change. Your Inpatient Rehabilitation Care Coordinator will coordinate services and will keep you informed of any changes. Your Inpatient Rehabilitation Care Coordinator's name and contact numbers are listed  below.  The following services may also be recommended but are not provided by the [facility]:    [place] will be made to provide these services after discharge if needed.  Arrangements include referral to agencies that provide these services.  Your insurance has been verified to be:  [facility] Medicaid  Your primary doctor is:  Karle Plumber, MD   Pertinent information will be  shared with your doctor and your insurance company.  Inpatient Rehabilitation Care Coordinator:  Erlene Quan, [place] or [phone]  Information discussed with and copy given to patient by: Dyanne Iha, 07/29/2020, 10:42 AM

## 2020-07-30 ENCOUNTER — Other Ambulatory Visit: Payer: Self-pay

## 2020-07-30 DIAGNOSIS — S065X0D Traumatic subdural hemorrhage without loss of consciousness, subsequent encounter: Secondary | ICD-10-CM | POA: Diagnosis not present

## 2020-07-30 MED ORDER — SORBITOL 70 % SOLN: 30.0000 mL | Freq: Every day | Status: DC | PRN

## 2020-07-30 NOTE — Progress Notes (Signed)
Physical Therapy Session Note  Patient Details  Name: Elizabeth Bush MRN: [id number] Date of Birth: Oct 05, 1970  Today's Date: 07/30/2020 PT Individual Time: 2951-8841 PT Individual Time Calculation (min): 61 min   Short Term Goals: Week 1:  PT Short Term Goal 1 (Week 1): Pt will demonstrate appropriate log roll technique for supine <>sit with CGA. PT Short Term Goal 2 (Week 1): Pt will perform STS ans SPVT transfers using RW with CGA. PT Short Term Goal 3 (Week 1): Pt will ambulate at least 30 feet with proper LLE TDWB technique using LRAD. PT Short Term Goal 4 (Week 1): Pt will maintain good standing balance with dynamic tasks and CGA for safety with use of RW.  Skilled Therapeutic Interventions/Progress Updates:    Pt received supine in bed with cervical collar in place and pt agreeable to therapy session though reporting fatigue and some drowsiness due to recent pain medication administration. Pt noticed to have decreased sustained attention throughout session. Pt wearing brace on R wrist due to pain and edema. Pt able to recall 3/3 hip precautions but requires cuing/reinforced education to apply them to functional mobility tasks and able ot recall L LE TDWBing precaution though unable to maintain during mobility. Supine>sitting L EOB, HOB partially elevated and using bedrail, with cuing to maintain hip precautions and CGA for safety. R stand pivot transfer EOB>w/c using RPFRW with min assist for lifting to stand and balance while turning - therapist placing foot under pt's L LE to monitor [place] and pt with poor compliance during transition from sitting to standing and decreased compliance during the pivot. B UE w/c propulsion ~146ft to main therapy gym with min assist as pt frequently veering R. Initiated gait training ~63ft using R PFRW with min assist and +2 w/c follow but pt repeatedly breaking L LE WBing precautions therefore discontinued - attempted to adjust height of AD to allow improved  leverage through B UEs to prevent L LE WBing. Gait training ~44ft using R PFRW with min assist of 1 and +2 w/c follow - therapist placing foot under pt's L LE and providing repeated verbal cuing to maintain TDWBing with pt having slightly improved though still poor compliance with precautions therefore discontinued further ambulation at this time. R stand pivot w/c>EOM using R PFRW - continued cuing for L LE TDWBing. Sit>supine min assist for B LE management and cuing for sequencing and maintaining hip precautions.  Performed the following supine L LE exercises: - active assist heel slides x8 reps through decreased ROM with increased hip pain - hip abduction/adduction AROM x10 reps  - short arc quads 2x10 reps - ankle pumps x20 reps bilaterally - glute sets 5sec hold x10 reps  Supine>sitting R EOM with supervision and min cuing for proper technique to maintain hip precautions. Transported back to room in w/c. Pt reporting need to use bathroom. Stand pivot w/c<>BSC over toilet using grab bar support with min assist for lifting/balance and therapist continuing to cue and monitor for L LE TDWBing - min assist to doff LB clothing and mod assist to don - continent of bladder and performed standing hygiene with assist for balance. Seated in w/c hand hygiene at sink. Stand pivot w/c>EOB using PFRW min assist as described above. Sit>supine CGA and cuing for technique with pt demoing poor recall from earlier on mat. Pt left supine in bed with needs in reach and bed alarm on.  Therapy Documentation Precautions:  Precautions Precautions: Fall,Cervical,Posterior Hip Precaution Booklet Issued: Yes (comment) Precaution  Comments: L posterior hip precautions Required Braces or Orthoses: Cervical Brace Cervical Brace: Hard collar,At all times Restrictions Weight Bearing Restrictions: Yes RLE Weight Bearing: Weight bearing as tolerated LLE Weight Bearing: Touchdown weight bearing Other Position/Activity  Restrictions: Per ortho note 3/10: LLE TDWB and would benefit from posterior hip precautions, education re: TDWB  Pain: Reports pain medication administered ~72minutes prior to session - vocalized pain during L LE exercises though unrated - provided rest breaks, distraction, and emotional support for pain management.   Therapy/Group: Individual Therapy  Tawana Scale , PT, DPT, CSRS  07/30/2020, 5:55 PM

## 2020-07-30 NOTE — Progress Notes (Addendum)
Occupational Therapy Session Note  Patient Details  Name: Elizabeth Bush MRN: [id number] Date of Birth: 01-13-1971  Today's Date: 07/30/2020 OT Individual Time: 3734-2876 OT Individual Time Calculation (min): 58 min    Short Term Goals: Week 1:  OT Short Term Goal 1 (Week 1): Pt will don LB clothing using LRAD with CGA OT Short Term Goal 2 (Week 1): Pt will don UB clothing with supervision OT Short Term Goal 3 (Week 1): Pt will demonstrate anticipatory awareness with supervision OT Short Term Goal 4 (Week 1): Pt will complete transfer to the toilet with CGA  Skilled Therapeutic Interventions/Progress Updates:    Session 1: ([id number]) Pt sitting on the 3:1 over the toilet to start session, finishing toileting tasks.  She was agreeable to shower and once toilet hygiene was completed at mod assist level sit to stand.  Decreased ability to follow TDWBing precautions on the LLE with sit to stand and transfer.  She was able to complete all bathing with supervision in sitting and lateral leans on the padded tub bench with the cutout.  She utilized the [facility] sponge for washing her lower legs and feet as well as the reacher for drying them off.  She was only able to recall 1/3 hip precautions when asked.  Min assist for transfer over to the wheelchair in front of the sink with the RW and platform on the right side.  She was able to use the reacher and the sockaide for donning her LB clothing with setup.  Mod instructional cueing and mod assist for standing balance to pull her pants and brief over her hips as she is putting too much weight through the LLE.  Finished session with completion of oral hygiene with setup from the wheelchair.  Call button and phone in reach with safety belt in place.    Session 2: [phone]) Pt in bed to start session agreeable to working on transfers with use of the RW.  She was able to state 2/3 THR precautions prior to transfer to the EOB with min guard assist.  She then utilized  the RW for stand pivot transfer to the wheelchair without use of the platform on the right side of the walker.  Pt now with wrist cock-up splint in place as well on the RUE.  She stated increased pain in the right wrist with weightbearing through the RUE with attempting transfer.  Noted pt with decreased ability to maintain TDWBing through the LLE as well during sit to stand transition and stand pivot transfer.  Re-applied the right platform to the walker for transfer back to the bed.  Finished session with call button and phone in reach with safety alarm in place.  Pt with reports of pain less than 5/10 throughout session with most pain stated in the back of her neck.    Therapy Documentation Precautions:  Precautions Precautions: Fall,Cervical,Posterior Hip Precaution Booklet Issued: Yes (comment) Precaution Comments: L posterior hip precautions Required Braces or Orthoses: Cervical Brace Cervical Brace: Hard collar,At all times Restrictions Weight Bearing Restrictions: Yes RLE Weight Bearing: Weight bearing as tolerated LLE Weight Bearing: Touchdown weight bearing Other Position/Activity Restrictions: Per ortho note 3/10: LLE TDWB and would benefit from posterior hip precautions, education re: TDWB  Pain: Pain Assessment Pain Scale: 0-10 Pain Score: 5  Pain Type: Acute pain Pain Location: Neck Pain Orientation: Posterior Pain Descriptors / Indicators: Aching;Discomfort Pain Frequency: Constant Pain Onset: On-going Pain Intervention(s): Repositioned;Distraction;Emotional support ADL: See Care Tool Section for some details  of mobility and selfcare  Therapy/Group: Individual Therapy  Cynda Soule OTR/L 07/30/2020, 9:01 AM

## 2020-07-30 NOTE — Progress Notes (Signed)
Orthopedic Tech Progress Note Patient Details:  Elizabeth Bush [birth year] [id number]  Ortho Devices Type of Ortho Device: Velcro wrist splint Ortho Device/Splint Location: RUE Ortho Device/Splint Interventions: Ordered,Application,Adjustment   Post Interventions Patient Tolerated: Well Instructions Provided: Care of [place] 07/30/2020, 9:46 AM
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
## 2020-07-30 NOTE — Progress Notes (Signed)
Speech Language Pathology Daily Session Note  Patient Details  Name: Elizabeth Bush MRN: [id number] Date of Birth: Oct 06, 1970  Today's Date: 07/30/2020 SLP Individual Time: 7989-2119 SLP Individual Time Calculation (min): 56 min  Short Term Goals: Week 1: SLP Short Term Goal 1 (Week 1): Pt will increase functional recall of novel information 80% accuracy min A cues SLP Short Term Goal 2 (Week 1): Pt will increase complex problem solving and safety awareness to 75% accuracy min A cues SLP Short Term Goal 3 (Week 1): Pt will complete high level cognitive tasks, including money management and med management, provided min A cues SLP Short Term Goal 4 (Week 1): Pt will increase sustained attention to >15 minutes provided Supervision level cues  Skilled Therapeutic Interventions: Pt was seen for skilled ST targeting cognitive goals. SLP facilitated session with a semi-complex monthly calendar/scheduling task, during which pt required overall Min A verbal and visual cueing for problem solving strategies in order to place appointments and other obligations on the calendar. She also required Min A verbal and visual cues for recall within task, and Supervision A verbal cues for awareness of errors. Pt verbally recalled 2/3 hip precautions without use of any aid/strategy, however SLP assisted her in creating a visually distinct written aid to increase recall and carryover of these precautions during her stay. SLP and pt also discussed use of calendar and/or recurring alarms as compensatory memory strategies to consider using at home for functional recall (ex: appointment and medication time reminders). Pt left sitting in wheelchair with alarm set and needs within reach. Continue per current plan of care.       Pain Pain Assessment Pain Scale: Faces Faces Pain Scale: No hurt   Therapy/Group: Individual Therapy  [name] 07/30/2020, 7:26 AM

## 2020-07-30 NOTE — Progress Notes (Signed)
Orthopedic Tech Progress Note Patient Details:  Elizabeth Bush 1970-05-28 [id number] Called and asked secretary to order a [place] J collar from [employer].  Patient ID: Elizabeth Bush, female   DOB: 03/12/1971, 50 y.o.   MRN: [id number]   [name] 07/30/2020, 9:08 AM

## 2020-07-31 ENCOUNTER — Encounter: Payer: Medicaid Other | Admitting: Physical Therapy

## 2020-07-31 DIAGNOSIS — S065X0D Traumatic subdural hemorrhage without loss of consciousness, subsequent encounter: Secondary | ICD-10-CM | POA: Diagnosis not present

## 2020-07-31 LAB — BASIC METABOLIC PANEL
Anion gap: 8 (ref 5–15)
BUN: <5 mg/dL — ABNORMAL LOW (ref 6–20)
CO2: 24 mmol/L (ref 22–32)
Calcium: 8.8 mg/dL — ABNORMAL LOW (ref 8.9–10.3)
Chloride: 102 mmol/L (ref 98–111)
Creatinine, Ser: 0.72 mg/dL (ref 0.44–1.00)
GFR, Estimated: >60 mL/min (ref >60)
Glucose, Bld: 91 mg/dL (ref 70–99)
Potassium: 3.5 mmol/L (ref 3.5–5.1)
Sodium: 134 mmol/L — ABNORMAL LOW (ref 135–145)

## 2020-07-31 NOTE — Progress Notes (Signed)
Patient ID: Elizabeth Bush, female   DOB: [birth year], 50 y.o.   MRN: [id number] Team Conference Report to Patient/Family  Team Conference discussion was reviewed with the patient and caregiver, including goals, any changes in plan of care and target discharge date.  Patient and caregiver express understanding and are in agreement.  The patient has a target discharge date of 08/08/20.  Dyanne Iha 07/31/2020, 1:22 PM
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
## 2020-07-31 NOTE — Progress Notes (Signed)
Occupational Therapy Session Note  Patient Details  Name: Elizabeth Bush MRN: [id number] Date of Birth: [birth year]  Today's Date: 07/31/2020 OT Individual Time: 9381-0175 OT Individual Time Calculation (min): 30 min    Short Term Goals: Week 1:  OT Short Term Goal 1 (Week 1): Pt will don LB clothing using LRAD with CGA OT Short Term Goal 2 (Week 1): Pt will don UB clothing with supervision OT Short Term Goal 3 (Week 1): Pt will demonstrate anticipatory awareness with supervision OT Short Term Goal 4 (Week 1): Pt will complete transfer to the toilet with CGA  Skilled Therapeutic Interventions/Progress Updates:    Pt in bed to start session agreeable to transfer to the EOB.  She was able to transfer with supervision with [facility] elevated.  Min instructional cueing needed to not bend too far forward in long sitting before transitioning her LEs off the edge of the bed.  She then completed stand pivot transfer to the wheelchair with use of the RW for support.  Decreased ability to maintain TDWBing through the LLE during transfer, with pt demonstrating more like PWBing.  She then propelled her wheelchair down to the ADL apartment with supervision and increased time.  Once in the apartment education was provided on simple snack/meal prep from wheelchair level, which pt will have to complete at home.  Had her roll around the kitchen and remove items from the refrigerator as well as the lowest shelf on the upper cabinet.  Provided reacher to use for retrieving items if too low or too high.  Discussed having her daughter place frequently used items on top of the counter top where they can be accessed more easily.  Still recommend initial 24 supervision for safety and family will be able to assist with meals as well.  Next, took pt down to the ortho gym where she worked on [employer] tasks with use of the UE ergonometer.  She was able to complete three, 5 minute intervals with resistance on level 5 and RPMs  maintained around 20.  First set was completed peddling forward with 2nd and 3rd sets completed with pt peddling in reverse secondary to causing less stress on her right wrist.  Finished session with return to the room and pt left sitting up in the wheelchair with SLP in for next session.    Therapy Documentation Precautions:  Precautions Precautions: Fall,Cervical,Posterior Hip Precaution Booklet Issued: Yes (comment) Precaution Comments: L posterior hip precautions Required Braces or Orthoses: Cervical Brace Cervical Brace: Hard collar,At all times Restrictions Weight Bearing Restrictions: Yes RLE Weight Bearing: Weight bearing as tolerated LLE Weight Bearing: Touchdown weight bearing Other Position/Activity Restrictions: Per ortho note 3/10: LLE TDWB and would benefit from posterior hip precautions, education re: TDWB  Pain: Pain Assessment Pain Scale: Faces Faces Pain Scale: Hurts whole lot Pain Type: Acute pain Pain Location: Neck Pain Orientation: Posterior Pain Descriptors / Indicators: Discomfort Pain Onset: With Activity Pain Intervention(s): Emotional support;Repositioned ADL: See Care Tool Section for some details of mobility and selfcare  Therapy/Group: Individual Therapy  MCGUIRE,JAMES OTR/L 07/31/2020, 1:58 PM

## 2020-07-31 NOTE — Progress Notes (Signed)
Occupational Therapy Session Note  Patient Details  Name: Elizabeth Bush MRN: [id number] Date of Birth: Dec 07, 1970  Today's Date: 07/31/2020 OT Individual Time: 1597-3312 OT Individual Time Calculation (min): 30 min    Short Term Goals: Week 1:  OT Short Term Goal 1 (Week 1): Pt will don LB clothing using LRAD with CGA OT Short Term Goal 2 (Week 1): Pt will don UB clothing with supervision OT Short Term Goal 3 (Week 1): Pt will demonstrate anticipatory awareness with supervision OT Short Term Goal 4 (Week 1): Pt will complete transfer to the toilet with CGA  Skilled Therapeutic Interventions/Progress Updates:    Pt received in recliner with MD present, agreeable to therapy. States she has minimal cervical pain, but did not quantify.  Reviewed posterior L hip precautions, pt able to ind recall 2/3 precautions. Monitoring LLE TTWB adherence with therapist foot under pt's L foot throughout session. However, pt with poor adherance and req freq VCs to maintain. Stand-pivot x3 throughout session with min A for initial lift/ balance with PFRW. Stood to complete table top activity for ~5 min with unilateral BUE support but discont 2/2 fatigue and poor TTWB adherence. W/c transport back to room and SP back to bed same manner as above. Sitting EOB > supine with close S.  Pt left with HOB at 30 degrees with bed alarm engaged, call bell in reach, and all immediate needs met.    Therapy Documentation Precautions:  Precautions Precautions: Fall,Cervical,Posterior Hip Precaution Booklet Issued: Yes (comment) Precaution Comments: L posterior hip precautions Required Braces or Orthoses: Cervical Brace Cervical Brace: Hard collar,At all times Restrictions Weight Bearing Restrictions: Yes RLE Weight Bearing: Weight bearing as tolerated LLE Weight Bearing: Touchdown weight bearing Other Position/Activity Restrictions: Per ortho note 3/10: LLE TDWB and would benefit from posterior hip precautions,  education re: TDWB Pain: see session note ADL: See Care Tool for more details.   Therapy/Group: Individual Therapy  Volanda Napoleon MS, OTR/L  07/31/2020, 12:30 PM
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
## 2020-07-31 NOTE — Progress Notes (Signed)
Speech Language Pathology Daily Session Note  Patient Details  Name: Elizabeth Bush MRN: [id number] Date of Birth: [birth year]  Today's Date: 07/31/2020 SLP Individual Time: 1414-1500 SLP Individual Time Calculation (min): 46 min  Short Term Goals: Week 1: SLP Short Term Goal 1 (Week 1): Pt will increase functional recall of novel information 80% accuracy min A cues SLP Short Term Goal 2 (Week 1): Pt will increase complex problem solving and safety awareness to 75% accuracy min A cues SLP Short Term Goal 3 (Week 1): Pt will complete high level cognitive tasks, including money management and med management, provided min A cues SLP Short Term Goal 4 (Week 1): Pt will increase sustained attention to >15 minutes provided Supervision level cues  Skilled Therapeutic Interventions: Pt was seen for skilled ST targeting cognitive goals. Pt received from OT ending his session with pt. SLP facilitated session with a complex medication management task. SLP assisted pt in creating a list as external aid to assist with recall of current medications - she only reported recognizing names of ~30% of current meds without review. She then used list to organize a QID pill box with Min A verbal and visual cues required for organization and error awareness, Mod A required for problem solving with 3X and 4X daily pills. Would recommend repeating this task prior to discharge in order to increase her accuracy and independence with this functional task. Pt in agreement. SLP reviewed organizational strategies with pt as well. Pt left sitting in wheelchair with alarm set and needs within reach. Continue per current plan of care.        Pain Pain Assessment Pain Scale: 0-10 Pain Score: 0-No pain  Therapy/Group: Individual Therapy  [name] 07/31/2020, 7:26 AM
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
## 2020-08-01 ENCOUNTER — Other Ambulatory Visit: Payer: Self-pay

## 2020-08-01 DIAGNOSIS — S065X0D Traumatic subdural hemorrhage without loss of consciousness, subsequent encounter: Secondary | ICD-10-CM | POA: Diagnosis not present

## 2020-08-01 MED ORDER — OXYCODONE HCL 5 MG PO TABS: 10.0000 mg | ORAL_TABLET | ORAL | Status: DC | PRN
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
## 2020-08-01 NOTE — Progress Notes (Signed)
Speech Language Pathology Daily Session Note  Patient Details  Name: Elizabeth Bush MRN: [id number] Date of Birth: 11/16/1970  Today's Date: 08/01/2020 SLP Individual Time: 0730-0825 SLP Individual Time Calculation (min): 55 min  Short Term Goals: Week 1: SLP Short Term Goal 1 (Week 1): Pt will increase functional recall of novel information 80% accuracy min A cues SLP Short Term Goal 2 (Week 1): Pt will increase complex problem solving and safety awareness to 75% accuracy min A cues SLP Short Term Goal 3 (Week 1): Pt will complete high level cognitive tasks, including money management and med management, provided min A cues SLP Short Term Goal 4 (Week 1): Pt will increase sustained attention to >15 minutes provided Supervision level cues  Skilled Therapeutic Interventions: Pt was seen for skilled ST targeting cognitive goals. SLP facilitated sessions with opportunities for recall of functional new information. Pt demonstrated ability to recall 3/3 hip precautions with Supervision A question cues, and her weight bearing precautions with Min A verbal cues. Increased Moderate verbal cues required for recall of her current medication functions and/or names (which were initially reviewed yesterday and created list). Pt required Mod A verbal cueing for problem solving and Min A for error awareness during a semi-complex time scenario word problem task that involved planning, prioritizing, and calculating times given hypothetical situations. SLP also introduced a novel card task (blink), during which pt required Min A verbal and visual cues for recall and problem solving within task. Pt left laying in bed with alarm set and needs within reach. Continue per current plan of care.        Pain Pain Assessment Pain Scale: Faces Faces Pain Scale: No hurt   Therapy/Group: Individual Therapy  [name] 08/01/2020, 7:25 AM
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
## 2020-08-01 NOTE — Progress Notes (Signed)
Physical Therapy Session Note  Patient Details  Name: Elizabeth Bush MRN: [id number] Date of Birth: Jan 04, 1971  Today's Date: 08/01/2020 PT Individual Time: 1100-1155 PT Individual Time Calculation (min): 55 min   Short Term Goals: Week 1:  PT Short Term Goal 1 (Week 1): Pt will demonstrate appropriate log roll technique for supine <>sit with CGA. PT Short Term Goal 2 (Week 1): Pt will perform STS ans SPVT transfers using RW with CGA. PT Short Term Goal 3 (Week 1): Pt will ambulate at least 30 feet with proper LLE TDWB technique using LRAD. PT Short Term Goal 4 (Week 1): Pt will maintain good standing balance with dynamic tasks and CGA for safety with use of RW.  Skilled Therapeutic Interventions/Progress Updates:    Patient received sitting up in bed, agreeable to PT. She reports 7/10 pain primarily in neck, premedicated. PT providing rest breaks, distractions, and repositioning to assist with pain management. She was able to come sit edge of bed with supervision and verbal cues to maintain precautions. Patient transferring to wc via stand pivot with RPFWW and CGA. She was able to propel herself in wc to therapy gym with supervision and BUE. She ambulated 70ft x2, 60ftx2 with seated rest break between. RPFWW + CGA and consistent verbal cues to maintain TTWB on L LE. Patient able to maintain this precaution ~75% of the time. PT reviewing energy conservation and activity modification techniques with patient. She was able to transfer to therapy mat with CGA and RPFWW, transitioning to supine with supervision. L LE quad sets, heel slides, seated LAQ 3x12. Patient requiring intermittent rest breaks due to increase in pain. She was able to transfer to wc via stand pivot with RPFWW and CGA. Patient requesting to return to bed. Ambulatory transfer with RPFWW and CGA. Patient remaining in bed, bed alarm on, call light within reach.   Therapy Documentation Precautions:  Precautions Precautions:  Fall,Cervical,Posterior Hip Precaution Booklet Issued: Yes (comment) Precaution Comments: L posterior hip precautions Required Braces or Orthoses: Cervical Brace Cervical Brace: Hard collar,At all times Restrictions Weight Bearing Restrictions: Yes RLE Weight Bearing: Weight bearing as tolerated LLE Weight Bearing: Touchdown weight bearing Other Position/Activity Restrictions: Per ortho note 3/10: LLE TDWB and would benefit from posterior hip precautions, education re: TDWB    Therapy/Group: Individual Therapy  Karoline Caldwell, PT, DPT, CBIS  08/01/2020, 8:08 AM

## 2020-08-01 NOTE — Progress Notes (Signed)
Occupational Therapy Session Note  Patient Details  Name: ROSAN CALBERT MRN: [id number] Date of Birth: 01-21-1971  Today's Date: 08/01/2020 OT Individual Time: 0900-1011 OT Individual Time Calculation (min): 71 min    Short Term Goals: Week 1:  OT Short Term Goal 1 (Week 1): Pt will don LB clothing using LRAD with CGA OT Short Term Goal 2 (Week 1): Pt will don UB clothing with supervision OT Short Term Goal 3 (Week 1): Pt will demonstrate anticipatory awareness with supervision OT Short Term Goal 4 (Week 1): Pt will complete transfer to the toilet with CGA  Skilled Therapeutic Interventions/Progress Updates:    Pt in bed to start session, agreeable to completion of showering and dressing.  Pt transferred to the EOB with supervision, mod instructional cueing for adhering to THR precautions for the LLE.  Next, she completed squat pivot transfer to the wheelchair with min assist for transfer to the shower.  He was able to complete all bathing with supervision in sitting and lateral leans for washing her buttocks.  LH sponge was used for washing the lower legs and feet, with use of the reacher for drying them.  She was able to complete transfer back to the wheelchair at min assist using the RW with platform.  Mod instructional cueing for technique and adherence to weightbearing status on the LLE.  She completed dressing sit to stand at the sink with min assist and use of the reacher and sockaide as well.  She completed UB dressing with setup.  Finished session with completion or oral hygiene in sitting with modified independence.  Recreational Therapy in the room at end of session to take over.  Call button and phone in reach.    Therapy Documentation Precautions:  Precautions Precautions: Fall,Cervical,Posterior Hip Precaution Booklet Issued: Yes (comment) Precaution Comments: L posterior hip precautions Required Braces or Orthoses: Cervical Brace Cervical Brace: Hard collar,At all  times Restrictions Weight Bearing Restrictions: No RLE Weight Bearing: Weight bearing as tolerated LLE Weight Bearing: Touchdown weight bearing Other Position/Activity Restrictions: Per ortho note 3/10: LLE TDWB and would benefit from posterior hip precautions, education re: TDWB  Pain: Pain Assessment Pain Scale: Faces Pain Score: 3  Faces Pain Scale: No hurt Pain Location: Neck Pain Orientation: Posterior Pain Intervention(s): Medication (See eMAR) ADL: See Care Tool Section for some details of mobility and selfcare  Therapy/Group: Individual Therapy  Laisa Larrick OTR/L 08/01/2020, 12:10 PM

## 2020-08-02 DIAGNOSIS — S065X0D Traumatic subdural hemorrhage without loss of consciousness, subsequent encounter: Secondary | ICD-10-CM | POA: Diagnosis not present

## 2020-08-02 LAB — CREATININE, SERUM
Creatinine, Ser: 0.73 mg/dL (ref 0.44–1.00)
GFR, Estimated: >60 mL/min (ref >60)

## 2020-08-02 MED ORDER — OXYCODONE HCL 5 MG PO TABS
15.0000 mg | ORAL_TABLET | Freq: Four times a day (QID) | ORAL | Status: DC | PRN
  Administered 2020-08-03 – 2020-08-04 (×2): 15 mg via ORAL
  Filled 2020-08-02 (×2): qty 3

## 2020-08-02 MED ORDER — OXYCODONE HCL 5 MG PO TABS
10.0000 mg | ORAL_TABLET | Freq: Four times a day (QID) | ORAL | Status: DC | PRN
  Administered 2020-08-03 – 2020-08-05 (×3): 10 mg via ORAL
  Filled 2020-08-02 (×3): qty 2

## 2020-08-02 NOTE — Progress Notes (Signed)
Speech Language Pathology Daily Session Note  Patient Details  Name: Elizabeth Bush MRN: [id number] Date of Birth: [birth year]  Today's Date: 08/02/2020 SLP Individual Time: 1050-1130 SLP Individual Time Calculation (min): 40 min  Short Term Goals: Week 1: SLP Short Term Goal 1 (Week 1): Pt will increase functional recall of novel information 80% accuracy min A cues SLP Short Term Goal 2 (Week 1): Pt will increase complex problem solving and safety awareness to 75% accuracy min A cues SLP Short Term Goal 3 (Week 1): Pt will complete high level cognitive tasks, including money management and med management, provided min A cues SLP Short Term Goal 4 (Week 1): Pt will increase sustained attention to >15 minutes provided Supervision level cues  Skilled Therapeutic Interventions:  Pt was seen for skilled ST targeting cognitive goals.  Upon arrival, pt was seated upright in bed, awake, with complaints of pain but willing to work with therapist.  Pt reports nursing had already administered pain meds prior to therapist's arrival.  SLP facilitated the session with skilled education reviewing and reinforcing memory strategies.  A handout was provided to maximize carryover in between therapy sessions and in the home environment.  SLP also facilitated the session with a novel deductive reasoning task to address problem solving goals.  Pt needed min assist verbal cues for task organization to complete task.  Pt was left in bed with bed alarm set and call bell within reach.  Continue per current plan of care.    Pain Pain Assessment Pain Scale: Faces Pain Score: 0-No pain Faces Pain Scale: Hurts little more Pain Type: Acute pain Pain Location: Neck Pain Descriptors / Indicators: Discomfort Pain Intervention(s): Repositioned  Therapy/Group: Individual Therapy  Denarius Sesler, Selinda Orion 08/02/2020, 12:34 PM
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
## 2020-08-02 NOTE — Progress Notes (Signed)
Physical Therapy Session Note  Patient Details  Name: Elizabeth Bush MRN: [id number] Date of Birth: 1970-11-17  Today's Date: 08/02/2020 PT Individual [phone] AND 1645-1715   57 min and 30 min   Short Term Goals: Week 1:  PT Short Term Goal 1 (Week 1): Pt will demonstrate appropriate log roll technique for supine <>sit with CGA. PT Short Term Goal 2 (Week 1): Pt will perform STS ans SPVT transfers using RW with CGA. PT Short Term Goal 3 (Week 1): Pt will ambulate at least 30 feet with proper LLE TDWB technique using LRAD. PT Short Term Goal 4 (Week 1): Pt will maintain good standing balance with dynamic tasks and CGA for safety with use of RW.  Skilled Therapeutic Interventions/Progress Updates:   Pt received supine in bed and agreeable to PT. Supine>sit transfer with supervision assist and cues for WB precautions.  Ambulatory transfer to toilet with [place]. Pt able to void bladder ontoilet, but noted to have urinary incontinence prior to toiletting. Mod-max assist for clothing managemen to due to cervical precautions. Peri care performed by pt with set up from PT.   Pt transported to [facility] with supervision assist and PFRW. Oral and facial hygiene completed at sink with set up assist from PT.   WC mobility through hall x 148f and supervision assist from PT and cues for improved symmetry to prevent veer to the R.   Gait training with RW x 465fwith supervision assist and from PT and cues for WB restrictions on the LLE intermittently. [facility] min-supervision assist for obstacle navigation. additoinal gait training in gift shop x 3090fith supervision assist from PT and UE support on PFRW to maintain TDWB on the LLE.   [facility]: LAQ 2 x 12, hip abduction AROM x 12, ankle PF/DF x 20. Pt returned to room and performed stand pivot transfer to bed with PFRW and supervision assist.  Sit>supine completed with supervision assist, and left supine in bed with call bell in reach and all needs met.   Session 2.   Pt received supine in bed and agreeable to PT. Supine>sit transfer with supervision assist and cues for WB restrictions. Ambulatory transfer to toilet to perform pericare following incontinent episode of bladder. Supervision assist from PT throughout transfer and hygiene for safety. Pt returned to room and performed ambulatory transfer to bed with supervision assist and PFRW. Sit>supine completed with supervision assist for safety and cues for set up. Pt  left supine in bed with call bell in reach and all needs met.        Therapy Documentation Precautions:  Precautions Precautions: Fall,Cervical,Posterior Hip Precaution Booklet Issued: Yes (comment) Precaution Comments: L posterior hip precautions Required Braces or Orthoses: Cervical Brace Cervical Brace: Hard collar,At all times Restrictions Weight Bearing Restrictions: No RLE Weight Bearing: Weight bearing as tolerated LLE Weight Bearing: Touchdown weight bearing Other Position/Activity Restrictions: Per ortho note 3/10: LLE TDWB and would benefit from posterior hip precautions, education re: TDWB General:   missed time: 15 min. pt fatigue.  Pain: Pain Assessment Pain Scale: 0-10 Pain Score: 0-No pain   Therapy/Group: Individual Therapy  AusLorie Phenix18/2022, 9:08 AM
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
## 2020-08-03 DIAGNOSIS — Z7409 Other reduced mobility: Secondary | ICD-10-CM

## 2020-08-03 DIAGNOSIS — R4182 Altered mental status, unspecified: Secondary | ICD-10-CM

## 2020-08-03 DIAGNOSIS — K5903 Drug induced constipation: Secondary | ICD-10-CM | POA: Diagnosis not present

## 2020-08-03 DIAGNOSIS — S065X1S Traumatic subdural hemorrhage with loss of consciousness of 30 minutes or less, sequela: Secondary | ICD-10-CM

## 2020-08-03 NOTE — Progress Notes (Signed)
Occupational Therapy Session Note  Patient Details  Name: Elizabeth Bush MRN: [id number] Date of Birth: Dec 29, 1970  Today's Date: 08/03/2020 OT Individual Time: 7341-9379 OT Individual Time Calculation (min): 37 min    Short Term Goals: Week 2:  OT Short Term Goal 1 (Week 2): Continue working on established LTGs set at supervision level overall.  Skilled Therapeutic Interventions/Progress Updates:    Pt received seated in w/c, agreeable to therapy, req to shower. C/o of neck pain, req rx, RN made aware. Doffed R shoe and TEDS total A. STS and amb to and from bathroom with close S + PFRW. Seated on BSC, doffed R hand splint, brief, shirt, pants with close S, total A to remove/apply C-collar. TTB transfer close S. Bathed full-body with use of AE and lateral leans to bathe peri area with close S and min VCs to avoid excessive cervical flexion. Amb transfer back to bed, donned wrap around shirt with min A to don R sleeve, donned new brief and pants with use of reacher and min A to pull up brief in back. Redonned TEDS total A. Sitting EOB > supine with close S. Noted improvement this date adhering to LLE TDWB precautions.  Pt left with HOB at 45 degrees with bed alarm engaged, call bell in reach, and all immediate needs met.    Therapy Documentation Precautions:  Precautions Precautions: Fall,Cervical,Posterior Hip Precaution Booklet Issued: Yes (comment) Precaution Comments: L posterior hip precautions Required Braces or Orthoses: Cervical Brace Cervical Brace: Hard collar,At all times Restrictions Weight Bearing Restrictions: No RLE Weight Bearing: Weight bearing as tolerated LLE Weight Bearing: Touchdown weight bearing Other Position/Activity Restrictions: Per ortho note 3/10: LLE TDWB and would benefit from posterior hip precautions, education re: TDWB Pain: Pain Assessment Pain Scale: Faces Pain Score: 8  Faces Pain Scale: Hurts little more Pain Type: Acute pain Pain Location:  Neck Pain Onset: On-going Pain Intervention(s): RN made aware ADL: See Care Tool for more details.    Therapy/Group: Individual Therapy  Volanda Napoleon MS, OTR/L  08/03/2020, 2:35 PM
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
## 2020-08-03 NOTE — Progress Notes (Signed)
Physical Therapy Weekly Progress Note  Patient Details  Name: Elizabeth Bush MRN: [id number] Date of Birth: 10/31/1970  Beginning of progress report period: July 27, 2020 End of progress report period: August 03, 2020  Patient has met 4 of 4 short term goals.  [name] is progressing well with therapy demonstrating increasing independence with functional mobility tasks. She is performing supine<>sit with supervision, sit<>stand and stand pivot transfers using PFRW with CGA, and progressed to ambulating up to 67f using PFRW with CGA. While improving, pt continues to demonstrate difficulty maintaining L LE TDWBing precautions during transfers and ambulation due to impaired motor planning and attention/awareness requiring frequent cuing for compliance. She continues to require cuing to recall posterior hip precautions and increased cuing to implement those during functional mobility tasks. Due to these cognitive impairments pt will require 24hr support at D/C for safety.   Patient continues to demonstrate the following deficits muscle weakness and muscle joint tightness, decreased cardiorespiratoy endurance, impaired timing and sequencing, unbalanced muscle activation and decreased motor planning,  , decreased attention, decreased awareness, decreased problem solving, decreased safety awareness and decreased memory and decreased sitting balance, decreased standing balance, decreased postural control, decreased balance strategies and difficulty maintaining precautions and therefore will continue to benefit from skilled PT intervention to increase functional independence with mobility.  Patient progressing toward long term goals..  Continue plan of care.  PT Short Term Goals Week 1:  PT Short Term Goal 1 (Week 1): Pt will demonstrate appropriate log roll technique for supine <>sit with CGA. PT Short Term Goal 1 - Progress (Week 1): Met PT Short Term Goal 2 (Week 1): Pt will perform STS ans SPVT transfers  using RW with CGA. PT Short Term Goal 2 - Progress (Week 1): Met PT Short Term Goal 3 (Week 1): Pt will ambulate at least 30 feet with proper LLE TDWB technique using LRAD. PT Short Term Goal 3 - Progress (Week 1): Met PT Short Term Goal 4 (Week 1): Pt will maintain good standing balance with dynamic tasks and CGA for safety with use of RW. PT Short Term Goal 4 - Progress (Week 1): Met Week 2:  PT Short Term Goal 1 (Week 2): = to LTGs based on ELOS  Skilled Therapeutic Interventions/Progress Updates:  Ambulation/gait training;Balance/vestibular training;Community reintegration;Discharge planning;Disease management/prevention;DME/adaptive equipment instruction;Functional mobility training;Neuromuscular re-education;Pain management;Patient/family education;Psychosocial support;Skin care/wound management;Splinting/orthotics;Stair training;Therapeutic Activities;Therapeutic Exercise;UE/LE Strength taining/ROM;UE/LE Coordination activities;Wheelchair propulsion/positioning;Cognitive remediation/compensation;Visual/perceptual remediation/compensation   Therapy Documentation Precautions:  Precautions Precautions: Fall,Cervical,Posterior Hip Precaution Booklet Issued: Yes (comment) Precaution Comments: L posterior hip precautions Required Braces or Orthoses: Cervical Brace Cervical Brace: Hard collar,At all times Restrictions Weight Bearing Restrictions: No RLE Weight Bearing: Weight bearing as tolerated LLE Weight Bearing: Touchdown weight bearing Other Position/Activity Restrictions: Per ortho note 3/10: LLE TDWB and would benefit from posterior hip precautions, education re: TJYNW CTawana Scale, PT, DPT, CSRS  08/03/2020, 7:49 AM

## 2020-08-03 NOTE — Progress Notes (Signed)
Speech Language Pathology Daily Session Note  Patient Details  Name: Elizabeth Bush MRN: [id number] Date of Birth: 1971-02-19  Today's Date: 08/03/2020 SLP Individual Time: 1000-1045 SLP Individual Time Calculation (min): 45 min  Short Term Goals: Week 1: SLP Short Term Goal 1 (Week 1): Pt will increase functional recall of novel information 80% accuracy min A cues SLP Short Term Goal 2 (Week 1): Pt will increase complex problem solving and safety awareness to 75% accuracy min A cues SLP Short Term Goal 3 (Week 1): Pt will complete high level cognitive tasks, including money management and med management, provided min A cues SLP Short Term Goal 4 (Week 1): Pt will increase sustained attention to >15 minutes provided Supervision level cues  Skilled Therapeutic Interventions: Pt seen for skilled ST targeting cognitive goals, pt sitting upright in wheelchair and agreeable to all therapeutic tasks. Pt continues to endorse mild cognitive deficits primarily in area of memory and word finding. SLP facilitating tx session by providing min A verbal cues to complete moderately complex divided attention activity. Pt average sustained attention during formal and informal tasks average >15 minutes at this time. Pt states she and her family would like home health aid to assist with home management task (dishes, laundry, etc.) at time of discharge d/t remaining physical deficits. Pt was left upright in wheelchair with alarm set and call bell within reach. Cont ST POC.   Pain Pain Assessment Pain Score: 8  Pain Location: Neck  Therapy/Group: Individual Therapy  Dewaine Conger 08/03/2020, 10:44 AM

## 2020-08-04 NOTE — Progress Notes (Signed)
Speech Language Pathology Daily Session Note  Patient Details  Name: Elizabeth Bush MRN: [id number] Date of Birth: [birth year]  Today's Date: 08/04/2020 SLP Individual Time: 0733-0800 SLP Individual Time Calculation (min): 27 min  Short Term Goals: Week 1: SLP Short Term Goal 1 (Week 1): Pt will increase functional recall of novel information 80% accuracy min A cues SLP Short Term Goal 2 (Week 1): Pt will increase complex problem solving and safety awareness to 75% accuracy min A cues SLP Short Term Goal 3 (Week 1): Pt will complete high level cognitive tasks, including money management and med management, provided min A cues SLP Short Term Goal 4 (Week 1): Pt will increase sustained attention to >15 minutes provided Supervision level cues  Skilled Therapeutic Interventions: Pt was seen for skilled ST targeting cognitive goals. SLP facilitated session with a task targeting short term recall with use of compensatory strategies. Pt used verbal repetition and visualization strategies in order to encode and recall details from line drawing picture scenes with Min faded to Supervision A verbal cues. Her recall was 90-100% accurate for each picture (4 total). Pt left sitting upright in bed with alarm set and needs within reach. Continue per current plan of care.        Pain Pain Assessment Pain Scale: 0-10 Pain Score: 0-No pain  Therapy/Group: Individual Therapy  [name] 08/04/2020, 7:27 AM

## 2020-08-04 NOTE — Progress Notes (Signed)
Speech Language Pathology Weekly Progress Note  Patient Details  Name: Elizabeth Bush MRN: [id number] Date of Birth: [birth year]  Beginning of progress report period: July 27, 2020 End of progress report period: August 04, 2020   Short Term Goals: Week 1: SLP Short Term Goal 1 (Week 1): Pt will increase functional recall of novel information 80% accuracy min A cues SLP Short Term Goal 1 - Progress (Week 1): Met SLP Short Term Goal 2 (Week 1): Pt will increase complex problem solving and safety awareness to 75% accuracy min A cues SLP Short Term Goal 2 - Progress (Week 1): Met SLP Short Term Goal 3 (Week 1): Pt will complete high level cognitive tasks, including money management and med management, provided min A cues SLP Short Term Goal 3 - Progress (Week 1): Met SLP Short Term Goal 4 (Week 1): Pt will increase sustained attention to >15 minutes provided Supervision level cues SLP Short Term Goal 4 - Progress (Week 1): Met    New Short Term Goals: Week 2: SLP Short Term Goal 1 (Week 2): STG=LTG due to remaining length of stay (anticipated discharge 08/08/20)  Weekly Progress Updates: Pt has made functional gains and met 4 out of 4 short term goals this reporting period. Pt is currently Min assist for mildly complex tasks due to mild cognitive impairments impacting her complex problem solving, short term memory, higher levels of attention, and emergent awareness. Pt has demonstrated improved functional short term recall and problem solving, as well as sustained and selective attention to tasks. Pt education is ongoing; no family has been present for ST sessions to date. Pt would continue to benefit from skilled ST while inpatient in order to maximize functional independence and reduce burden of care prior to discharge. Anticipate that pt will need 24/7 supervision at discharge in addition to [place] follow up at next level of care.       Intensity: Minumum of 1-2 x/day, 30 to 90 minutes Frequency:  3 to 5 out of 7 days Duration/Length of Stay: 08/08/20 Treatment/Interventions: Cognitive remediation/compensation;Therapeutic Activities;Functional tasks;Patient/family education;Cueing hierarchy;Internal/external aids        [name] 08/04/2020, 7:58 AM

## 2020-08-05 ENCOUNTER — Encounter: Payer: Medicaid Other | Admitting: Physical Therapy

## 2020-08-05 ENCOUNTER — Ambulatory Visit: Payer: Medicaid Other | Attending: Orthopaedic Surgery | Admitting: Physical Therapy

## 2020-08-05 DIAGNOSIS — S065X0D Traumatic subdural hemorrhage without loss of consciousness, subsequent encounter: Secondary | ICD-10-CM | POA: Diagnosis not present

## 2020-08-05 MED ORDER — OXYCODONE HCL 5 MG PO TABS
5.0000 mg | ORAL_TABLET | ORAL | Status: DC | PRN
  Administered 2020-08-05 – 2020-08-06 (×2): 5 mg via ORAL
  Filled 2020-08-05 (×2): qty 1

## 2020-08-05 NOTE — Plan of Care (Signed)
  Problem: Sit to Stand Goal: LTG:  Patient will perform sit to stand with assistance level (PT) Description: LTG:  Patient will perform sit to stand with assistance level (PT) Flowsheets (Taken 08/05/2020 1423) LTG: PT will perform sit to stand in preparation for functional mobility with assistance level: (downgraded due to patient progress/memory of precautions) Contact Guard/Touching assist Note: downgraded due to patient progress/memory of precautions   Problem: RH Bed to Chair Transfers Goal: LTG Patient will perform bed/chair transfers w/assist (PT) Description: LTG: Patient will perform bed to chair transfers with assistance (PT). Flowsheets (Taken 08/05/2020 1423) LTG: Pt will perform Bed to Chair Transfers with assistance level: (downgraded due to patient progress/memory of precautions) Contact Guard/Touching assist Note: downgraded due to patient progress/memory of precautions   Problem: RH Car Transfers Goal: LTG Patient will perform car transfers with assist (PT) Description: LTG: Patient will perform car transfers with assistance (PT). Flowsheets (Taken 08/05/2020 1423) LTG: Pt will perform car transfers with assist:: (downgraded due to patient progress/memory of precautions) Contact Guard/Touching assist Note: downgraded due to patient progress/memory of precautions   Problem: RH Furniture Transfers Goal: LTG Patient will perform furniture transfers w/assist (OT/PT) Description: LTG: Patient will perform furniture transfers  with assistance (OT/PT). Flowsheets (Taken 08/05/2020 1423) LTG: Pt will perform furniture transfers with assist:: (downgraded due to patient progress/memory of precautions) Contact Guard/Touching assist Note: downgraded due to patient progress/memory of precautions   Problem: RH Ambulation Goal: LTG Patient will ambulate in controlled environment (PT) Description: LTG: Patient will ambulate in a controlled environment, # of feet with assistance  (PT). Flowsheets (Taken 08/05/2020 1423) LTG: Pt will ambulate in controlled environ  assist needed:: (downgraded due to patient progress/memory of precautions) Contact Guard/Touching assist Note: downgraded due to patient progress/memory of precautions Goal: LTG Patient will ambulate in home environment (PT) Description: LTG: Patient will ambulate in home environment, # of feet with assistance (PT). Flowsheets (Taken 08/05/2020 1423) LTG: Pt will ambulate in home environ  assist needed:: (downgraded due to patient progress/memory of precautions) Contact Guard/Touching assist Note: downgraded due to patient progress/memory of precautions   Problem: RH Stairs Goal: LTG Patient will ambulate up and down stairs w/assist (PT) Description: LTG: Patient will ambulate up and down # of stairs with assistance (PT) Flowsheets (Taken 08/05/2020 1423) LTG: Pt will  ambulate up and down number of stairs: 1 4" curb with LRAD per home set up

## 2020-08-05 NOTE — Progress Notes (Signed)
Physical Therapy Session Note  Patient Details  Name: Elizabeth Bush MRN: [id number] Date of Birth: [birth year]  Today's Date: 08/05/2020 PT Individual Time: 0865-7846; 9629-5284 PT Individual Time Calculation (min): 45 min and 56 mins  Short Term Goals: Week 2:  PT Short Term Goal 1 (Week 2): = to LTGs based on ELOS  Skilled Therapeutic Interventions/Progress Updates:    Session 1: Patient received in bathroom, agreeable to PT. She reports 8/10 pain in neck and L hip, premedicated. PT providing rest breaks, distractions and repositioning to assist with pain management. She required CGA for sit >stand from toilet and was able to complete perihygiene with supervision. MaxA for clothing management from due to hip precautions limiting her ability to bend down to reach pants. Once seated in wc, patient requiring MinA for dressing. She was able to wash up at sink, seated, with set up assist. Patient propelling herself in wc to therapy gym x162ft with supervision. She ambulated 29ft, completed dual cog+ motor task in standing alphabetizing tiles, then ambulating 67ft back to wc. She requires CGA and consistent verbal/tactile cues to maintain weightbearing precautions. Patient able to verbalize that she is meant to be TTWB, but is adhering to more PWB status at this time. PT reviewing home modification, activity modifications and wc parts with patient. Discussed completing as many ADLs/IADLs in sitting as possible for energy conservation as well as minimizing risk for falling. Patient ambulating additional 68ft with CGA and was adherent to TTWB ~50% of the time. She returned to her room in wc, seatbelt alarm on, call light within reach.   Session 2: Patient received sitting up in bed, agreeable to PT. She reports 5/10 pain in neck and L hip, premedicated. PT providing rest breaks, distractions and repositioning to assist with pain management. She was able to come sit edge of bed with supervision and verbal cues  to adhere to precautions. She transferred to wc via stand pivot with CGA. Patient propelling herself in wc to therapy gym with supervision. Negotiating 4" curb with RPFWW x4 with rest breaks in between. Patient unable to adhere to TTWB during this mobility, but is able to verbalize what her weight bearing and hip precautions are. She required MinA + consistent verbal cues for safety when negotiating curb. PT providing patient with supine and seated HEP to continue at home. Exercises provided with modifications to ensure that patient does not break precautions while completing them. Patient returning to room in wc, transferring to bed via stand pivot with CGA. Bed alarm on, call light within reach.    Therapy Documentation Precautions:  Precautions Precautions: Fall,Cervical,Posterior Hip Precaution Booklet Issued: Yes (comment) Precaution Comments: L posterior hip precautions Required Braces or Orthoses: Cervical Brace Cervical Brace: Hard collar,At all times Restrictions Weight Bearing Restrictions: Yes RLE Weight Bearing: Weight bearing as tolerated LLE Weight Bearing: Touchdown weight bearing Other Position/Activity Restrictions: Per ortho note 3/10: LLE TDWB and would benefit from posterior hip precautions, education re: TDWB    Therapy/Group: Individual Therapy  Karoline Caldwell, PT, DPT, CBIS  08/05/2020, 8:53 AM

## 2020-08-05 NOTE — Plan of Care (Signed)
  Problem: Consults Goal: RH GENERAL PATIENT EDUCATION Description: See Patient Education module for education specifics. Outcome: Progressing Goal: Skin Care Protocol Initiated - if Braden Score 18 or less Description: If consults are not indicated, leave blank or document N/A Outcome: Progressing   Problem: RH BOWEL ELIMINATION Goal: RH STG MANAGE BOWEL WITH ASSISTANCE Description: STG Manage Bowel with mod I Assistance. Outcome: Progressing Goal: RH STG MANAGE BOWEL W/MEDICATION W/ASSISTANCE Description: STG Manage Bowel with Medication with mod I Assistance. Outcome: Progressing   Problem: RH SKIN INTEGRITY Goal: RH STG SKIN FREE OF INFECTION/BREAKDOWN Description: Manage skin with min assist Outcome: Progressing Goal: RH STG MAINTAIN SKIN INTEGRITY WITH ASSISTANCE Description: STG Maintain Skin Integrity With min Assistance. Outcome: Progressing Goal: RH STG ABLE TO PERFORM INCISION/WOUND CARE W/ASSISTANCE Description: STG Able To Perform Incision/Wound Care With min Assistance. Outcome: Progressing   Problem: RH SAFETY Goal: RH STG ADHERE TO SAFETY PRECAUTIONS W/ASSISTANCE/DEVICE Description: STG Adhere to Safety Precautions With cues/reminders Assistance/Device. Outcome: Progressing Goal: RH STG DECREASED RISK OF FALL WITH ASSISTANCE Description: STG Decreased Risk of Fall With min Assistance. Outcome: Progressing   Problem: RH PAIN MANAGEMENT Goal: RH STG PAIN MANAGED AT OR BELOW PT'S PAIN GOAL Description: Less than 3 out of 10 Outcome: Progressing   Problem: RH KNOWLEDGE DEFICIT GENERAL Goal: RH STG INCREASE KNOWLEDGE OF SELF CARE AFTER HOSPITALIZATION Description: Patient will be able to demonstrate knowledge of medication management, dietary management, skin/wound care, weight bearing precautions, pain management with educational materials and handouts provided by staff independently  Outcome: Progressing   Problem: Consults Goal: RH BRAIN INJURY PATIENT  EDUCATION Description: Description: See Patient Education module for eduction specifics Outcome: Progressing

## 2020-08-05 NOTE — Progress Notes (Signed)
Speech Language Pathology Daily Session Note  Patient Details  Name: Elizabeth Bush MRN: [id number] Date of Birth: 1970/10/09  Today's Date: 08/05/2020 SLP Individual Time: 1002-1041 SLP Individual Time Calculation (min): 39 min  Short Term Goals: Week 2: SLP Short Term Goal 1 (Week 2): STG=LTG due to remaining length of stay (anticipated discharge 08/08/20)  Skilled Therapeutic Interventions: Pt was seen for skilled ST targeting cognitive skills. SLP facilitated session with functional conversation targeting recall of new information. Pt verbally recalled all hip and weight bearing precautions with Supervision A verbal cues. Increased Min A verbal cues required for recall of ~75% of her current medications (she independently recalled that he took medicine for pain and gas/heartburn). SLP further facilitated session with a semi-complex money scenario/management task, during which pt required Min A verbal cues for awareness of errors in calculations, but only Supervision A verbal cues for problem solving. Pt also called daughter during session to discuss request for attendance in family education, which daughter acknowledged but was unable to commit to any certain time. SLP encouraged pt to encourage daughter to attend therapies tomorrow or Weds so she can have education prior to discharge. Pt left sitting upright in bed with alarm set and needs within reach. Continue per current plan of care.          Pain Pain Assessment Pain Scale: Faces Faces Pain Scale: No hurt  Therapy/Group: Individual Therapy  [name] 08/05/2020, 7:21 AM

## 2020-08-05 NOTE — Progress Notes (Signed)
Occupational Therapy Session Note  Patient Details  Name: Elizabeth Bush MRN: [id number] Date of Birth: [birth year]  Today's Date: 08/05/2020 OT Individual Time: 0905-1000 OT Individual Time Calculation (min): 55 min    Short Term Goals: Week 2:  OT Short Term Goal 1 (Week 2): Continue working on established LTGs set at supervision level overall.  Skilled Therapeutic Interventions/Progress Updates:    Pt up in wheelchair to start session.  She was agreeable to showering with transfer to the tub bench with min assist using the RW for support and mod instructional cueing for technique to adhere to her TDWBing in the LLE.  She was able to state 3/3 THR precautions as well.  She was able to utilize the reacher for removal of LB clothing with min guard assist sit to stand and use of the grab bars.  She then completed all bathing in sitting with lateral leans and use of the LH sponge with supervision.  Min assist for transfer back to the wheelchair with dressing tasks completed sit to stand at the sink.  Setup for donning her pullover shirt as well as min guard assist for all LB dressing with AE except for TEDs, which were total assist.  Finished session with pt sitting in the wheelchair and with the call button and phone in reach and safety belt in place.    Therapy Documentation Precautions:  Precautions Precautions: Fall,Cervical,Posterior Hip Precaution Booklet Issued: Yes (comment) Precaution Comments: L posterior hip precautions Required Braces or Orthoses: Cervical Brace Cervical Brace: Hard collar,At all times Restrictions Weight Bearing Restrictions: Yes RLE Weight Bearing: Weight bearing as tolerated LLE Weight Bearing: Touchdown weight bearing Other Position/Activity Restrictions: Per ortho note 3/10: LLE TDWB and would benefit from posterior hip precautions, education re: TDWB   Pain: Pain Assessment Pain Scale: 0-10 Pain Score: 7  Pain Type: Acute pain Pain Location: Hip Pain  Descriptors / Indicators: Aching Pain Onset: With Activity Pain Intervention(s): Emotional support;Repositioned Multiple Pain Sites: Yes 2nd Pain Site Pain Type: Acute pain Pain Location: Neck Pain Orientation: Posterior Pain Descriptors / Indicators: Discomfort ADL: See Care Tool Section for some details of mobility and selfcare  Therapy/Group: Individual Therapy  Adana Marik OTR/L 08/05/2020, 10:01 AM
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
## 2020-08-06 DIAGNOSIS — S065X0D Traumatic subdural hemorrhage without loss of consciousness, subsequent encounter: Secondary | ICD-10-CM | POA: Diagnosis not present

## 2020-08-06 MED ORDER — OXYCODONE HCL 5 MG PO TABS
5.0000 mg | ORAL_TABLET | Freq: Four times a day (QID) | ORAL | Status: DC | PRN
  Administered 2020-08-07: 5 mg via ORAL
  Filled 2020-08-06: qty 1

## 2020-08-06 NOTE — Progress Notes (Signed)
Physical Therapy Session Note  Patient Details  Name: Elizabeth Bush MRN: [id number] Date of Birth: Sep 24, 1970  Today's Date: 08/06/2020 PT Individual Time: 0905-1000 PT Individual Time Calculation (min): 55 min   Short Term Goals: Week 2:  PT Short Term Goal 1 (Week 2): = to LTGs based on ELOS  Skilled Therapeutic Interventions/Progress Updates:   Patient received in bathroom, agreeable to PT. She reports 4/10 pain in neck and L hip, premedicated. PT providing rest breaks, distractions and repositioning to assist with pain management. She was able to complete perihygiene in standing with supervision and lower body dressing in standing with supervision. She continues to require Max verbal cues to abide by her precautions. Patient ambulating to wc in room with RPFWW and CGA. PT donning TED hose TotalA. She was able to propel herself x200 ft in wc with supervision. PT extensively educating patient on safe car transfer. She reports access to only "low cars." PT re-emphasizing importance of maintaining precautions (posterior hip and weightbearing) to optimize healing. Discussed that patient will have to approach car seat backward to sit first prior to swinging LE in. As well, patient will need to lower back of chair and slide chair back as well as assume posterior lean in order to get L LE into car and not break precautions. When coming out of the car, PT advised patient to put pillow between her knees to prevent too much adduction and to "kick stand" L LE and use R LE primarily to stand up from low car without flexing hips beyond 90*. Patient attempted to demonstrate this car transfer with assist from PT, however, she was unable to not break precautions despite multimodal cuing and previous demonstration/education. Patient completing seated LAQ with emphasis on L quad activation and then knee flex for max AROM. AROM to L knee improving. Patient returning to room in wc opting to return to bed. PT requesting  that patient discuss with family importance of attending family ed prior to dc. Patient verbalized understanding. Bed alarm on, call light within reach.    Therapy Documentation Precautions:  Precautions Precautions: Fall,Cervical,Posterior Hip Precaution Booklet Issued: Yes (comment) Precaution Comments: L posterior hip precautions Required Braces or Orthoses: Cervical Brace Cervical Brace: Hard collar,At all times Restrictions Weight Bearing Restrictions: Yes RLE Weight Bearing: Weight bearing as tolerated LLE Weight Bearing: Touchdown weight bearing Other Position/Activity Restrictions: Per ortho note 3/10: LLE TDWB and would benefit from posterior hip precautions, education re: TDWB    Therapy/Group: Individual Therapy  Karoline Caldwell, PT, DPT, CBIS  08/06/2020, 7:39 AM

## 2020-08-06 NOTE — Progress Notes (Signed)
Patient ID: Elizabeth Bush, female   DOB: 1971/01/13, 50 y.o.   MRN: [id number]   Patient [name], [name], [name]
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
## 2020-08-06 NOTE — Progress Notes (Signed)
Speech Language Pathology Daily Session Note  Patient Details  Name: Elizabeth Bush MRN: [id number] Date of Birth: 07-19-1970  Today's Date: 08/06/2020 SLP Individual Time: 0815-0855 SLP Individual Time Calculation (min): 40 min  Short Term Goals: Week 2: SLP Short Term Goal 1 (Week 2): STG=LTG due to remaining length of stay (anticipated discharge 08/08/20)  Skilled Therapeutic Interventions: Pt was seen for skilled ST targeting education with pt and her daughter, as well as cognitive goals. Skilled education focused on ST goals and progress as well as review of compensatory strategies for short term memory, attention, and problem solving for complex tasks. Also discussed pt's decreased emergent awareness and how this could impact pt's functioning and safety at home, as well as how daughter can assist her with recognizing functional errors. Emphasized need for 24/7 supervision at home, although pt does demonstrate excellent safety awareness from a cognitive standpoint. Pt recalled her hip and weight bearing precautions Mod I. She also completed a complex medication management task for the second time today in order to work toward greater independence and accuracy with this task. She used a list of her current medications to organize a QID pill box with Supervision A verbal and visual cues for error awareness, problem solving, and recall of organizational strategies previously discussed. Her performance was improved today in comparison to last targeted session. Discussed supervision with this task at home. Pt left sitting in bed with alarm set and needs within reach. Continue per current plan of care.       Pain Pain Assessment Pain Scale: 0-10 Pain Score: 0-No pain  Therapy/Group: Individual Therapy  [name] 08/06/2020, 7:27 AM

## 2020-08-06 NOTE — Progress Notes (Signed)
Occupational Therapy Session Note  Patient Details  Name: Elizabeth Bush MRN: [id number] Date of Birth: July 21, 1970  Today's Date: 08/06/2020 OT Individual Time: 1740-8144 OT Individual Time Calculation (min): 75 min    Short Term Goals: Week 2:  OT Short Term Goal 1 (Week 2): Continue working on established LTGs set at supervision level overall.  Skilled Therapeutic Interventions/Progress Updates:    Pt in bed to start session.  She was able to transfer to the EOB with min instructional cueing for maintaining THR precautions.  She then completed stand pivot transfer to the wheelchair with supervision in order to be transferred to the toilet.  She was able to complete toilet transfer with supervision using the RW with platform on the right side.  Once finished, she was able to perform toilet hygiene and clothing management with supervision as well as transfer back to the wheelchair.  Took her down to the therapy gym where she worked on sit to stand transitions and standing balance while engaged in use of the [place] system.  She was able to complete sit to stand with supervision assist from the wheelchair with supervision for standing balance.  She was able to complete cognitive task for intervals of 3 and 4 mins with 50% accuracy and difficulty with 5 word recall on the first attempt.  Second interval she was able to complete with 80% accuracy with recall up to 9 words.  Also had her complete visual pursuits with rotational component.  She was able to perform with an average of 87% and a time of 1:16 seconds for 31 number sequence.  Finished session with completion of 7 mins BUE strengthening on the UE ergonometer with RPMs maintained at 23-30.  Returned to the room via wheelchair with transfer to the bed at supervision level with use of the RW for support.  Nursing in room to administer medications.    Therapy Documentation Precautions:  Precautions Precautions: Fall,Cervical,Posterior Hip Precaution  Booklet Issued: Yes (comment) Precaution Comments: L posterior hip precautions Required Braces or Orthoses: Cervical Brace Cervical Brace: Hard collar,At all times Restrictions Weight Bearing Restrictions: Yes RLE Weight Bearing: Weight bearing as tolerated LLE Weight Bearing: Touchdown weight bearing Other Position/Activity Restrictions: Per ortho note 3/10: LLE TDWB and would benefit from posterior hip precautions, education re: TDWB   Pain: Pain Assessment Pain Scale: Faces Pain Score: 0-No pain ADL: See Care Tool Section for some details of mobility and selfcare  Therapy/Group: Individual Therapy  Elizabeth Bush OTR/L 08/06/2020, 3:48 PM

## 2020-08-07 ENCOUNTER — Encounter: Payer: Medicaid Other | Admitting: Physical Therapy

## 2020-08-07 ENCOUNTER — Other Ambulatory Visit: Payer: Self-pay | Admitting: Physician Assistant

## 2020-08-07 DIAGNOSIS — S065X0A Traumatic subdural hemorrhage without loss of consciousness, initial encounter: Secondary | ICD-10-CM

## 2020-08-07 MED ORDER — ENOXAPARIN SODIUM 30 MG/0.3ML ~~LOC~~ SOLN: SUBCUTANEOUS | 0 refills | Status: DC

## 2020-08-07 MED ORDER — TRAMADOL HCL 50 MG PO TABS: 50.0000 mg | ORAL_TABLET | Freq: Four times a day (QID) | ORAL | 0 refills | Status: DC | PRN

## 2020-08-07 MED ORDER — ACETAMINOPHEN 325 MG PO TABS: 325.0000 mg | ORAL_TABLET | ORAL | Status: DC | PRN

## 2020-08-07 MED ORDER — SENNOSIDES-DOCUSATE SODIUM 8.6-50 MG PO TABS: 1.0000 | ORAL_TABLET | Freq: Every day | ORAL | Status: DC

## 2020-08-07 MED ORDER — POLYETHYLENE GLYCOL 3350 17 G PO PACK: 17.0000 g | PACK | Freq: Two times a day (BID) | ORAL | 0 refills | Status: DC

## 2020-08-07 MED ORDER — METHOCARBAMOL 500 MG PO TABS: 1000.0000 mg | ORAL_TABLET | Freq: Three times a day (TID) | ORAL | 0 refills | Status: DC

## 2020-08-07 MED ORDER — OXYCODONE HCL 5 MG PO TABS: 5.0000 mg | ORAL_TABLET | Freq: Four times a day (QID) | ORAL | 0 refills | Status: DC | PRN

## 2020-08-07 MED ORDER — DULOXETINE HCL 30 MG PO CPEP: 30.0000 mg | ORAL_CAPSULE | Freq: Every morning | ORAL | 3 refills | Status: DC

## 2020-08-07 MED ORDER — OMEPRAZOLE 20 MG PO CPDR: 20.0000 mg | DELAYED_RELEASE_CAPSULE | Freq: Two times a day (BID) | ORAL | 0 refills | Status: DC

## 2020-08-07 MED ORDER — OXYCODONE HCL 5 MG PO TABS
5.0000 mg | ORAL_TABLET | Freq: Three times a day (TID) | ORAL | Status: DC | PRN
  Administered 2020-08-08: 5 mg via ORAL
  Filled 2020-08-07: qty 1

## 2020-08-07 MED ORDER — AMLODIPINE BESYLATE 10 MG PO TABS: 10.0000 mg | ORAL_TABLET | Freq: Every day | ORAL | 3 refills | Status: DC

## 2020-08-07 MED ORDER — ENOXAPARIN SODIUM 40 MG/0.4ML ~~LOC~~ SOLN: SUBCUTANEOUS | 0 refills | Status: DC

## 2020-08-07 MED FILL — METHOCARBAMOL 500 MG TABS: 500 | 30 days supply | Qty: 180 | Fill #0

## 2020-08-07 MED FILL — ENOXAPARIN SODIUM 40 MG/0.4: 40 | 14 days supply | Qty: 6 | Fill #0

## 2020-08-07 MED FILL — traMADol HCL 50 MG TABS: 50 | 6 days supply | Qty: 30 | Fill #0

## 2020-08-07 MED FILL — OMEPRAZOLE 20 MG CPDR: 20 | 14 days supply | Qty: 28 | Fill #0

## 2020-08-07 MED FILL — AMLODIPINE BESYLATE 10 MG T: 10 | 90 days supply | Qty: 90 | Fill #0

## 2020-08-07 MED FILL — DULoxetine HCL 30 MG CPEP: 30 | 30 days supply | Qty: 30 | Fill #0

## 2020-08-07 NOTE — Progress Notes (Signed)
Occupational Therapy Discharge Summary  Patient Details  Name: Elizabeth Bush MRN: [id number] Date of Birth: October 29, 1970  Today's Date: 08/07/2020 OT Individual Time: 0802-0901 OT Individual Time Calculation (min): 59 min   Session Note:  Pt's son in for education during session.  Discussed need for AE with LB selfcare secondary to precautions.  Also educated son on pt's current weightbearing limitations in the LLE and her difficulty maintaining it with sit to stand, transfers, and functional mobility.  He provided hands on assist for transfer from the bed into the bathroom with therapist monitoring her weightbearing status with each step.  She was able to maintain TDBWing throughout the transfer/short distance mobility with use of the RW with platform.  Once in the shower, he stepped out as pt's daughter's will be providing supervision for bathing and dressing.  She was able to remove all dirty clothing, wash in sitting, and then transfer over to the 3:1 for dressing at supervision level throughout and use of the [place] sponge and reacher.  She donned her brief and pants with use of the reacher as well as using the sockaide for donning her gripper socks.  Therapist assisted with donning TEDs and instructed pt to ask PA if she needs to continue wearing them at home.  Educated her son that she would need assist with donning them.  Instructed son on need for keeping cervical collar in place at all times, since she will not be able to access the second floor for showering at this time.  Demonstrated how to change out the pads as well and also her cervical precautions to be followed.  Finished session with pt sitting in the wheelchair with the call button and phone in reach and safety belt in place.    Patient has met 8 of 8 long term goals due to improved activity tolerance, improved balance, ability to compensate for deficits, improved attention and improved awareness.  Patient to discharge at overall Supervision  level.  Patient's care partner requires assistance to provide the necessary physical and cognitive assistance at discharge.    Reasons not met: NA    Recommendation:  Patient will benefit from ongoing skilled OT services in outpatient setting to continue to advance functional skills in the area of BADL, iADL and Reduce care partner burden.  Pt currently supervision level for selfcare tasks and functional transfers related to ADL tasks secondary to having THR precautions as well as weight bearing precautions in the LLE.  She continues to need min to mod instructional cueing for adherence to them during sit to stand and transfers.  In addition, she continues to need use of a wrist cockup splint on the right hand as well as a platform on the RW as she cannot tolerate weightbearing through the wrist.  Feel she will benefit from continue OT via outpatient to progress ADL function as well as simple home management adhering to her precautions.    Equipment: 3:1 wheelchair  Reasons for discharge: treatment goals met and discharge from hospital  Patient/family agrees with progress made and goals achieved: Yes  OT Discharge Precautions/Restrictions  Precautions Precautions: Fall;Cervical;Posterior Hip Precaution Comments: L posterior hip precautions Required Braces or Orthoses: Cervical Brace Cervical Brace: Hard collar;At all times (can remove for showering) Restrictions Weight Bearing Restrictions: Yes RLE Weight Bearing: Non weight bearing LLE Weight Bearing: Touchdown weight bearing   Pain Pain Assessment Pain Scale: 0-10 Pain Score: 5  Faces Pain Scale: Hurts a little bit Pain Type: Acute pain Pain  Location: Neck Pain Orientation: Posterior Pain Descriptors / Indicators: Aching Pain Onset: On-going Pain Intervention(s): Medication (See eMAR) ADL ADL Equipment Provided: Sock aid,Reacher,Long-handled sponge Eating: Independent Where Assessed-Eating: Wheelchair Grooming:  Independent Where Assessed-Grooming: Wheelchair Upper Body Bathing: Supervision/safety Where Assessed-Upper Body Bathing: Chair,Shower Lower Body Bathing: Supervision/safety Where Assessed-Lower Body Bathing: Chair,Shower Upper Body Dressing: Setup Where Assessed-Upper Body Dressing: Wheelchair Lower Body Dressing: Supervision/safety Where Assessed-Lower Body Dressing: Wheelchair Toileting: Supervision/safety Where Assessed-Toileting: Glass blower/designer: Distant supervision Armed forces technical officer Method: Arts development officer: Radiographer, therapeutic: Unable to English as a second language teacher: Close supervision Social research officer, government Method: Stand pivot [employer] Equipment: Radio broadcast assistant Vision Baseline Vision/History: Wears glasses Wears Glasses: At all times Patient Visual Report: Blurring of vision Vision Assessment?: No apparent visual deficits Perception  Perception: Within Functional Limits Praxis Praxis: Intact Cognition Overall Cognitive Status: Impaired/Different from baseline Arousal/Alertness: Awake/alert Attention: Sustained Sustained Attention: Appears intact Memory: Impaired Memory Impairment: Decreased recall of new information Awareness Impairment: Anticipatory impairment;Emergent impairment Problem Solving: Impaired Safety/Judgment: Impaired Comments: Pt continues to need cueing for safety in order to adhere to her weightbearing precautions as well as her hip precautions. Sensation Sensation Light Touch: Appears Intact Hot/Cold: Appears Intact Proprioception: Appears Intact Stereognosis: Appears Intact Additional Comments: Sensation grosly intact in BUEs Coordination Gross Motor Movements are Fluid and Coordinated: Yes Fine Motor Movements are Fluid and Coordinated: Yes Coordination and Movement Description: BUE coordination WFLs for selfcare tasks Motor  Motor Motor: Within Functional Limits Motor - Discharge  Observations: Pt with left hip precautions as well as weightbearing restrictions in the LLE, resulting in generalized weakness and slower completion of transfers and LB selfcare tasks. Mobility  Bed Mobility Bed Mobility: Sit to Supine;Supine to Sit Supine to Sit: Supervision/Verbal cueing Sit to Supine: Supervision/Verbal cueing Transfers Sit to Stand: Supervision/Verbal cueing Stand to Sit: Supervision/Verbal cueing  Trunk/Postural Assessment  Cervical Assessment Cervical Assessment: Within Functional Limits (cervical collar in place) Thoracic Assessment Thoracic Assessment: Within Functional Limits Lumbar Assessment Lumbar Assessment: Within Functional Limits  Balance Balance Balance Assessed: Yes Static Sitting Balance Static Sitting - Balance Support: Feet supported;Left upper extremity supported Static Sitting - Level of Assistance: 7: Independent Dynamic Sitting Balance Dynamic Sitting - Balance Support: During functional activity Dynamic Sitting - Level of Assistance: 7: Independent Static Standing Balance Static Standing - Balance Support: During functional activity;Bilateral upper extremity supported Static Standing - Level of Assistance: 5: Stand by assistance Dynamic Standing Balance Dynamic Standing - Balance Support: During functional activity;Bilateral upper extremity supported Dynamic Standing - Level of Assistance: 5: Stand by assistance Extremity/Trunk Assessment RUE Assessment RUE Assessment: Exceptions to [facility] Active Range of Motion (AROM) Comments: Slight limitation in wrist extension with bruising and increased pain noted with extension.  All other joints AROM WFLs.  strength 4/5 throughout excpet for wrist and gross grasp, 3+/5 LUE Assessment LUE Assessment: Exceptions to [facility] General Strength Comments: Shoulder AROM limited by cervixal precautions but at least 110 degrees for completion of selfcare tasks.  All other ares of AROM WFLs with strength grossly  4/5 throughout.  Not formally assessed however.   Tashema Tiller OTR/L 08/07/2020, 12:29 PM

## 2020-08-07 NOTE — Progress Notes (Signed)
Patient ID: Elizabeth Bush, female   DOB: 02/14/1971, 51 y.o.   MRN: [id number] Team Conference Report to Patient/Family  Team Conference discussion was reviewed with the patient and caregiver, including goals, any changes in plan of care and target discharge date.  Patient and caregiver express understanding and are in agreement.  The patient has a target discharge date of 08/08/20.   Patient ready for d/c on 3/24. Family edu with pt son completed today. Patient to begin Lovenox. Patient successfully completed car transfer with PT, medical transfer not necessarily. Pt updated. Pt daughter will be here for d/c between [place], [place]  Dyanne Iha 08/07/2020, 1:27 PM

## 2020-08-07 NOTE — Patient Care Conference (Signed)
Inpatient RehabilitationTeam Conference and Plan of Care Update Date: 08/07/2020   Time: 10:45 AM    Patient Name: Elizabeth Bush      Medical Record Number: [id number]  Date of Birth: September 12, 1970 Sex: Female         Room/Bed: 4W11C/4W11C-01 Payor Info: Payor: [place] / Plan: [place] MEDICAID [facility] / Product Type: *No Product type* /    Admit Date/Time:  07/26/2020  1:42 PM  Primary Diagnosis:  Traumatic subdural hematoma [facility])  Hospital Problems: Principal Problem:   Traumatic subdural hematoma [facility]) Active Problems:   Drug induced constipation   Pain    Expected Discharge Date: Expected Discharge Date: 08/08/20  Team Members Present: Physician leading conference: Dr. Leeroy Cha Care Coodinator Present: Dorien Chihuahua, RN, BSN, CRRN;Christina [place], [place] Nurse Present: Dorien Chihuahua, RN PT Present: Page Spiro, PT OT Present: Clyda Greener, OT SLP Present: Jettie Booze, CF-SLP PPS Coordinator present : Ileana Ladd, PT     Current Status/Progress Goal Weekly Team Focus  Bowel/Bladder   Continent of B/B with episodes of incontince  Become fully continent of bladder.  Timed toileting, Assess Qshift and PRN   Swallow/Nutrition/ Hydration             ADL's   Currently supervision for UB selfcare with supervision for LB selfcare using AE.  Supervision for functional transfers with use of the RW with mod instructional cueing for following THR precautions and weightbearing status  supervision overall  selfcare retraining, balance retraining, transfer training, DME/AE education, pt education   Mobility   supervision with verbal cues for precautions for bed mobility, CGA sit<> stand with RPFWW, ambulating up to 47ft with CGA, RPFWW and consistent cues for precautions, wc mobility 230ft supervision, 1 (4") curb MinA with RPFWW, MinA car transfer- unable to really perform any mobility without breaking her precautions though she can recite what the  precautions are  CGA  fam ed, dc planning, pt education, transfers, stairs/curb, precautions   Communication             Safety/Cognition/ Behavioral Observations  Supervision  Supervision  finish working on complex problem solving and memory tasks, finish education   Pain   C/O of pain on back, hip and wrist.  Pain <3/10  Assess Qshift and PRN   Skin   Pt has multiple abrasion over body, laceration on scalp with sutures open to air. Healed incision of left knee.  Abrasions and laceration will continue to heal.  Assess skin per shift and PRN     Discharge Planning:  D/c home with dauhghter and bf. 2level home, 1/2 bath on main   Team Discussion: No changes, ready for discharge. Requires instructional cues to maintain weight bearing restrictions and cock up splint for wrist pain.   Patient on target to meet rehab goals: yes, currently supervision - CGA for walking with a platform walker and transfers with min assist. Able to manage 4" steps with walker. Supervision for emergent awareness.  *See Care Plan and progress notes for long and short-term goals.   Revisions to Treatment Plan:   Teaching Needs: Lovenox injection, medications, transfers, toileting, steps, etc.   Current Barriers to Discharge: Decreased caregiver support, Home enviroment access/layout and Weight bearing restrictions  Possible Resolutions to Barriers: Family education completed 08/07/20    Medical Summary Current Status: continues to have some dizziness with ambulation, sutures in scalp.  Barriers to Discharge: Medical stability  Barriers to Discharge Comments: continues to have some dizziness  with ambulation, sutures in scalp with dried blood, Possible Resolutions to [employer]: continue comression garments, needs to go home on 2 weeks of Lovenox- nursing education should be initiated, continue to cover incision during shower   Continued Need for Acute Rehabilitation Level of Care: The patient  requires daily medical management by a physician with specialized training in physical medicine and rehabilitation for the following reasons: Direction of a multidisciplinary physical rehabilitation program to maximize functional independence : Yes Medical management of patient stability for increased activity during participation in an intensive rehabilitation regime.: Yes Analysis of laboratory values and/or radiology reports with any subsequent need for medication adjustment and/or medical intervention. : Yes   I attest that I was present, lead the team conference, and concur with the assessment and plan of the team.   Dorien Chihuahua B 08/07/2020, 2:52 PM

## 2020-08-07 NOTE — Discharge Summary (Signed)
Physical Therapy Discharge Summary  Patient Details  Name: Elizabeth Bush MRN: [id number] Date of Birth: August 06, 1970  Today's Date: 08/07/2020 PT Individual Time: 1105-1200   PT Individual Time Calculation (min): 55 min   Patient has met 10 of 11 long term goals due to improved activity tolerance, improved balance, improved postural control, increased strength, increased range of motion, decreased pain, ability to compensate for deficits, improved attention, improved awareness and improved coordination.  Patient to discharge at a wheelchair level with supervision to increase pt independence with functional mobility and ambulating household distances using [place] with CGA/supervision assist. Patient's care partner attended hands-on education/training and is independent to provide the necessary physical and cognitive assistance at discharge.   Reasons goals not met: Patient only able to ambulate up to 62f using R PFRW with CGA due to fatigue causing decreased ability to maintain L LE TDWBing precautions with increased distance.  Recommendation:  Patient will benefit from ongoing skilled PT services in outpatient setting to continue to advance safe functional mobility, address ongoing impairments in L LE strength and ROM, standing balance, gait training, stair navigation, and minimize fall risk.   Equipment: R platform for walker, 20x18in wheelchair with cushion and elevating leg rests with antitippers  Reasons for discharge: treatment goals met and discharge from hospital  Patient/family agrees with progress made and goals achieved: Yes  Skilled Therapeutic Interventions/Progress Updates:  Pt received sitting in w/c wearing cervical collar with her son present for hands-on family education/training. Pt eager to participate in session. Therapy session focused on family education/training with the following covered:  - pt's L LE TDWB and posterior hip precautions - pt's cervical precautions wearing  hard collar at all times - DME recommendations with follow-up therapy recommendations as listed above - wheelchair part management - pt's CLOF with need for 24hr support requiring CGA for functional mobility and cuing to maintain L LE precautions - educated on fall risk safety in the home picking up rugs and providing clear pathways for PFRW navigation  - discussed wearing tennis shoes on R LE and gripper sock on L LE to assist with recall and implementing L LE WBing precautions during functional mobility Transported to/from gym in w/c for time management and energy conservation. Pt ambulated ~426fusing PFRW with pt's son providing CGA for safety with min progressed to no cuing from therapist on proper technique. Discussed car transfer technique with pt's son confirming that family only has sedan vehicles available. Therapist provided demonstration and education regarding proper set-up of car seat and sequencing of pt performing "kick stand" position on L LE to maintain WBing and hip precautions along with B UE hand placement on vehicle to help with eccentric control vs pushing up to maintain precautions. Pt completed 1x with therapist providing CGA and then 1x with pt's son providing CGA. Pt/so report no questions/concerns regarding this technique and demonstrated understanding. Discussed curb navigation stepping up/down using PFRW with therapist providing visual demonstration and education on technique/sequencing and AD management - recommended that pt ascend backwards to improve WBing compliance and increase safety with balance when managing AD. Pt reports she has been ascending forward therefore attempted 1x with pt demonstrating inability to maintain L LE WBing precautions and requires min assist for lifting onto step - pt agreeable to attempt posterior ascent and demonstrates significant improvement with technique maintaining WBing precautions and only requires CGA. Pt/son performed 2x with min  progressed to no cuing from therapist demonstrating proper technique/sequencing. Educated family on how to bump  pt up curb step in wheelchair; however, pt and son agree they both feel more comfortable with pt using PFRW to complete this task. Pt transported back to room in w/c. Pt's son able to manage w/c parts and set-up pt for transfer with min cuing from patient (no cuing form therapist) and assist pt providing CGA for stand pivot transfer using PFRW with only 1x min cuing from therapist. Educated pt's son on how pt completes bed mobility to maintain hip precautions - completed with supervision. Pt left in supported upright sitting in bed with needs in reach, bed alarm on, and her son present.   PT Discharge Precautions/Restrictions Precautions Precautions: Fall;Cervical;Posterior Hip Precaution Comments: L posterior hip precautions Required Braces or Orthoses: Cervical Brace Cervical Brace: Hard collar;At all times Restrictions Weight Bearing Restrictions: Yes RLE Weight Bearing: Weight bearing as tolerated LLE Weight Bearing: Touchdown weight bearing Other Position/Activity Restrictions: Per ortho note 3/10: LLE TDWB and would benefit from posterior hip precautions, education re: TDWB Pain Pain Assessment Pain Scale: Faces Faces Pain Scale: Hurts a little bit Pain Location: Hip Pain Orientation: Left Pain Descriptors / Indicators: Aching Pain Intervention(s): Rest;Repositioned;Relaxation Perception  Perception Perception: Within Functional Limits Praxis Praxis: Intact  Cognition Overall Cognitive Status: Impaired/Different from baseline Arousal/Alertness: Awake/alert Orientation Level: Oriented X4 Attention: Focused;Sustained;Selective Focused Attention: Appears intact Sustained Attention: Appears intact Selective Attention: Appears intact Awareness: Impaired Problem Solving: Impaired Safety/Judgment: Impaired Comments: Pt continues to need cueing for safety in order to  adhere to her weightbearing precautions as well as her hip precautions. Sensation Sensation Light Touch: Appears Intact Hot/Cold: Not tested Proprioception: Appears Intact Stereognosis: Not tested Coordination Gross Motor Movements are Fluid and Coordinated: Yes Coordination and Movement Description: movements are coordinated WFL while maintaining precautions Motor  Motor Motor: Within Functional Limits Motor - Discharge Observations: Pt with left hip precautions as well as TDWBing restrictions in the LLE, resulting in generalized weakness and slower completion of transfers and LB selfcare tasks but [facility]  Mobility Bed Mobility Bed Mobility: Supine to Sit;Sit to Supine Supine to Sit: Supervision/Verbal cueing Sit to Supine: Supervision/Verbal cueing Transfers Transfers: Sit to Stand;Stand to Sit;Stand Pivot Transfers Sit to Stand: Supervision/Verbal cueing Stand to Sit: Supervision/Verbal cueing Stand Pivot Transfers: Supervision/Verbal cueing Stand Pivot Transfer Details: Verbal cues for precautions/safety Transfer (Assistive device): Right platform walker Locomotion  Gait Ambulation: Yes Gait Assistance: Contact Guard/Touching assist Gait Distance (Feet): 50 Feet Assistive device: Right platform walker Gait Assistance Details: Verbal cues for precautions/safety;Verbal cues for safe use of DME/AE;Verbal cues for gait pattern;Verbal cues for technique;Verbal cues for sequencing Gait Gait: Yes Gait Pattern: Impaired Gait Pattern:  (Partial hop-to pattern to maintain L LE TDWBing) Gait velocity: decreased in order to maintain safety with L LE TDWBing precautions Stairs / Additional Locomotion Stairs: Yes Stairs Assistance: Contact Guard/Touching assist Stair Management Technique: Backwards;With walker (R PFRW) Number of Stairs: 1 Height of Stairs: 4 Curb: Contact Guard/Touching assist (backwards as above) Product manager Mobility: Yes Wheelchair Assistance:  Chartered loss adjuster: Both upper extremities Wheelchair Parts Management: Needs assistance (due to posterior hip precautions unable to flex forward to manage leg rests) Distance: 218f  Trunk/Postural Assessment  Cervical Assessment Cervical Assessment: Exceptions to [facility] place) Thoracic Assessment Thoracic Assessment: Within Functional Limits Lumbar Assessment Lumbar Assessment: Within Functional Limits Postural Control Postural Control: Within Functional Limits (using PFRW for daily functional mobility tasks with close supervision/CGA)  Balance Balance Balance Assessed: Yes Static Sitting Balance Static Sitting - Balance Support: Feet  supported;Left upper extremity supported Static Sitting - Level of Assistance: 7: Independent Dynamic Sitting Balance Dynamic Sitting - Balance Support: During functional activity Dynamic Sitting - Level of Assistance: 7: Independent Static Standing Balance Static Standing - Balance Support: During functional activity;Bilateral upper extremity supported Static Standing - Level of Assistance: 5: Stand by assistance Dynamic Standing Balance Dynamic Standing - Balance Support: During functional activity;Bilateral upper extremity supported Dynamic Standing - Level of Assistance: Other (comment);5: Stand by assistance (CGA) Extremity Assessment      RLE Assessment Active Range of Motion (AROM) Comments: WFL General Strength Comments: grossly 4+/5 assessed during functional mobility tasks with significant improvement in pain management LLE Assessment LLE Assessment: Exceptions to [facility] Active Range of Motion (AROM) Comments: continues to lack full knee ROM from prior TKA though not formally measured, sufficient ROM to perform functional mobility tasks while maintaining TDWBing precautions from hip LLE Strength Left Hip Flexion: 3/5 (<90 degrees to maintain precautions) Left Hip Extension: 3/5 Left Hip  ABduction: 3/5 Left Hip ADduction: 3/5 (to neutral to maintain precautions) Left Knee Flexion: 3+/5 Left Knee Extension: 4-/5 Left Ankle Dorsiflexion: 4/5 Left Ankle Plantar Flexion: 4/5    Ilea Hilton M Oris Staffieri , PT, DPT, CSRS  08/07/2020, 8:00 AM
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
## 2020-08-07 NOTE — Discharge Instructions (Signed)
Inpatient Rehab Discharge Instructions  Elizabeth Bush Discharge date and time: No discharge date for patient encounter.   Activities/Precautions/ Functional Status: Activity: Touchdown weightbearing left lower extremity posterior hip precautions Diet: regular diet Wound Care: Routine skin checks Functional status:  ___ No restrictions     ___ Walk up steps independently ___ 24/7 supervision/assistance   ___ Walk up steps with assistance ___ Intermittent supervision/assistance  ___ Bathe/dress independently ___ Walk with walker     _x__ Bathe/dress with assistance ___ Walk Independently    ___ Shower independently ___ Walk with assistance    ___ Shower with assistance ___ No alcohol     ___ Return to work/school ________  COMMUNITY REFERRALS UPON DISCHARGE:    Outpatient: PT     OT                Agency: [facility]  Phone: [phone]              Appointment Date/Time: Facility to Determine at Discharge  Medical Equipment/Items Ordered: Wheelchair, Platform, Shower Attachment and Shower Chair                                                 Agency/Supplier: [employer]   Special Instructions: No driving smoking or alcohol  No aspirin products until follow-up with neurosurgery Dr. Pieter Partridge Dawley [phone]   My questions have been answered and I understand these instructions. I will adhere to these goals and the provided educational materials after my discharge from the hospital.  Patient/Caregiver Signature _______________________________ Date __________  Clinician Signature _______________________________________ Date __________  Please bring this form and your medication list with you to all your follow-up doctor's appointments.

## 2020-08-07 NOTE — Progress Notes (Signed)
Inpatient Rehabilitation Care Coordinator Discharge Note  The overall goal for the admission was met for:   Discharge location: Yes, home  Length of Stay: Yes. 13 Days   Discharge activity level: Yes  Home/community participation: Yes  Services provided included: MD, RD, PT, OT, SLP, RN, CM, TR, Pharmacy, Neuropsych and SW  Financial Services: Medicaid  Choices offered to/list presented to:n/a  Follow-up services arranged: Outpatient: [facility]  Comments (or additional information): PT OT Wheelchair, Platform, Shower Attachment, Civil engineer, contracting  Patient/Family verbalized understanding of follow-up arrangements: Yes  Individual responsible for coordination of the follow-up plan: Maryclare Bean [phone]  Confirmed correct DME delivered: Dyanne Iha 08/07/2020    Dyanne Iha

## 2020-08-07 NOTE — Progress Notes (Signed)
Physical Therapy Note  Patient Details  Name: Elizabeth Bush MRN: [id number] Date of Birth: [birth year] Today's Date: 08/07/2020  Today's Date: 08/07/2020 PT Missed Time: 30 Minutes Missed Time Reason: Patient fatigue  Pt received supine in bed reporting she was fatigued and politely requests to rest at this time. Pt denies any questions or concerns regarding discharge stating "I'm ready to go home and I feel like I'll have no problem getting around my house." Pt left supine in bed with needs in reach and bed alarm on. Missed 30 minutes of skilled physical therapy.  Tawana Scale , PT, DPT, CSRS  08/07/2020, 4:55 PM

## 2020-08-07 NOTE — Progress Notes (Signed)
Speech Language Pathology Discharge Summary  Patient Details  Name: Elizabeth Bush MRN: [id number] Date of Birth: 29-Nov-1970  Today's Date: 08/07/2020 SLP Individual Time: 2548-6282 SLP Individual Time Calculation (min): 26 min   Skilled Therapeutic Interventions:  Pt was seen for skilled ST targeting cognitive goals. SLP facilitated session with administration of MOCA version 7.1 as means of post-test for this admission. Pt scored 26/30 which per the standardized scoring measure is considered WFL. During calculations subtest she independently used pen and paper as strategy to increase accuracy in calculations. She also self-corrected errors X3 and recalled 3/5 words on delayed recall task without cues. Pt and her family report that she is back to baseline level of cognitive functioning. Pt left sitting in bed with alarm set and needs within reach. Continue per current plan of care.    Patient has met 3 of 3 long term goals.  Patient to discharge at overall Supervision level.  Reasons goals not met: n/a   Clinical Impression/Discharge Summary:   Pt made functional gains and met 3 out of 3 long term goals this admission. Pt currently requires Supervision assist for mildly complex to complex functional familiar tasks due to cognitive impairments impacting her complex problem solving, emergent awareness, short term memory, and higher levels of attention. As a result, she will require 24/7 supervision at discharge. Pt has demonstrated improved functional problem solving, selective attention, recall of new information, and awareness of errors. Pt and family report that pt is back to baseline level of cognitive functioning, follow up skilled ST services are indicated upon discharge. Pt and family education is complete at this time.    Care Partner:  Caregiver Able to Provide Assistance: Yes  Type of Caregiver Assistance: Cognitive  Recommendation:  24 hour supervision/assistance  Rationale for SLP  Follow Up: Other (comment) (n/a)   Equipment: none   Reasons for discharge: Discharged from hospital   Patient/Family Agrees with Progress Made and Goals Achieved: Yes    [name] 08/07/2020, 12:44 PM

## 2020-08-08 DIAGNOSIS — K5903 Drug induced constipation: Secondary | ICD-10-CM | POA: Diagnosis not present

## 2020-08-08 DIAGNOSIS — S065X0D Traumatic subdural hemorrhage without loss of consciousness, subsequent encounter: Secondary | ICD-10-CM | POA: Diagnosis not present

## 2020-08-08 DIAGNOSIS — S63501D Unspecified sprain of right wrist, subsequent encounter: Secondary | ICD-10-CM | POA: Diagnosis not present

## 2020-08-08 DIAGNOSIS — R32 Unspecified urinary incontinence: Secondary | ICD-10-CM | POA: Diagnosis not present

## 2020-08-08 DIAGNOSIS — S065X9A Traumatic subdural hemorrhage with loss of consciousness of unspecified duration, initial encounter: Secondary | ICD-10-CM | POA: Diagnosis not present

## 2020-08-08 DIAGNOSIS — K625 Hemorrhage of anus and rectum: Secondary | ICD-10-CM | POA: Diagnosis not present

## 2020-08-08 DIAGNOSIS — S32442D Displaced fracture of posterior column [ilioischial] of left acetabulum, subsequent encounter for fracture with routine healing: Secondary | ICD-10-CM

## 2020-08-08 DIAGNOSIS — R159 Full incontinence of feces: Secondary | ICD-10-CM | POA: Diagnosis not present

## 2020-08-08 DIAGNOSIS — S065X0S Traumatic subdural hemorrhage without loss of consciousness, sequela: Secondary | ICD-10-CM

## 2020-08-08 DIAGNOSIS — R52 Pain, unspecified: Secondary | ICD-10-CM | POA: Diagnosis not present
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
## 2020-08-08 NOTE — Progress Notes (Signed)
Sutures taken out per order, tolerated well no complaints. Patient resting in bed with call bell within reach.

## 2020-08-08 NOTE — Progress Notes (Signed)
Pt being discharged off unit with family member and belongings, discharge instructions given by PA. Pt is AxO4, no complications noted at this time.   Elizabeth Bush

## 2020-08-09 NOTE — Patient Outreach (Signed)
Spoke with patient on 08-09-20, [place] from hospital on 08-08-20. States they [place] her with a bag of medications.   F/U 3 weeks for delivery in 30 days.

## 2020-08-12 ENCOUNTER — Encounter: Payer: Medicaid Other | Admitting: Physical Therapy

## 2020-08-12 ENCOUNTER — Telehealth: Payer: Self-pay | Admitting: Registered Nurse

## 2020-08-12 NOTE — Telephone Encounter (Signed)
Placed another call to [name], no answer. Left message to return the call.

## 2020-08-12 NOTE — Telephone Encounter (Signed)
Transitional Care call  Patient name: Elizabeth Bush DOB: 31-Mar-1971 1. Are you/is patient experiencing any problems since coming home? No a. Are there any questions regarding any aspect of care? No 2. Are there any questions regarding medications administration/dosing? No a. Are meds being taken as prescribed? Yes b. "Patient should review meds with caller to confirm" Medication List Reviewed.  3. Have there been any falls? No 4. Has Home Health been to the house and/or have they contacted you? No a. If not, have you tried to contact them? No b. Can we help you contact them? Yes, This provider placed a call to [facility] , they will call Ms. Circle to schedule appointments.  5. Are bowels and bladder emptying properly? Yes a. Are there any unexpected incontinence issues? No b. If applicable, is patient following bowel/bladder programs? NA 6. Any fevers, problems with breathing, unexpected pain? No 7. Are there any skin problems or new areas of breakdown? No 8. Has the patient/family member arranged specialty MD follow up (ie cardiology/neurology/renal/surgical/etc.)?  [name] will call Dr Dawley and Dr Ninfa Linden to schedule HFU appointments, she verbalizes understanding.  a. Can we help arrange? No 9. Does the patient need any other services or support that we can help arrange? No 10. Are caregivers following through as expected in assisting the patient? Yes 11. Has the patient quit smoking, drinking alcohol, or using drugs as recommended? (                        )  Appointment date/time 08/19/2020  arrival time 09:45 for 10:00 appointment with Bayard Hugger ANP-C. At [place] [facility]

## 2020-08-12 NOTE — Telephone Encounter (Signed)
Placed a call to [name] daughter of [name]. She's driving at this time. This provider will call her back, she verbalizes understanding.

## 2020-08-14 ENCOUNTER — Encounter: Payer: Medicaid Other | Admitting: Physical Therapy

## 2020-08-16 DIAGNOSIS — Z419 Encounter for procedure for purposes other than remedying health state, unspecified: Secondary | ICD-10-CM | POA: Diagnosis not present

## 2020-08-19 ENCOUNTER — Encounter: Payer: Self-pay | Admitting: Physician Assistant

## 2020-08-19 ENCOUNTER — Encounter: Payer: Medicaid Other | Attending: Registered Nurse | Admitting: Registered Nurse

## 2020-08-19 ENCOUNTER — Ambulatory Visit (INDEPENDENT_AMBULATORY_CARE_PROVIDER_SITE_OTHER): Payer: Medicaid Other

## 2020-08-19 ENCOUNTER — Ambulatory Visit (INDEPENDENT_AMBULATORY_CARE_PROVIDER_SITE_OTHER): Payer: Medicaid Other | Admitting: Physician Assistant

## 2020-08-19 ENCOUNTER — Other Ambulatory Visit: Payer: Self-pay

## 2020-08-19 ENCOUNTER — Encounter: Payer: Self-pay | Admitting: Registered Nurse

## 2020-08-19 ENCOUNTER — Encounter: Payer: Medicaid Other | Admitting: Physical Therapy

## 2020-08-19 VITALS — BP 145/90 | HR 82 | Temp 97.9°F | Ht 67.0 in | Wt 199.0 lb

## 2020-08-19 DIAGNOSIS — R519 Headache, unspecified: Secondary | ICD-10-CM | POA: Diagnosis not present

## 2020-08-19 DIAGNOSIS — R42 Dizziness and giddiness: Secondary | ICD-10-CM | POA: Insufficient documentation

## 2020-08-19 DIAGNOSIS — R262 Difficulty in walking, not elsewhere classified: Secondary | ICD-10-CM | POA: Insufficient documentation

## 2020-08-19 DIAGNOSIS — F17210 Nicotine dependence, cigarettes, uncomplicated: Secondary | ICD-10-CM | POA: Insufficient documentation

## 2020-08-19 DIAGNOSIS — R531 Weakness: Secondary | ICD-10-CM | POA: Diagnosis not present

## 2020-08-19 DIAGNOSIS — R410 Disorientation, unspecified: Secondary | ICD-10-CM | POA: Diagnosis not present

## 2020-08-19 DIAGNOSIS — S065X1D Traumatic subdural hemorrhage with loss of consciousness of 30 minutes or less, subsequent encounter: Secondary | ICD-10-CM

## 2020-08-19 DIAGNOSIS — S62101D Fracture of unspecified carpal bone, right wrist, subsequent encounter for fracture with routine healing: Secondary | ICD-10-CM | POA: Diagnosis not present

## 2020-08-19 DIAGNOSIS — Z560 Unemployment, unspecified: Secondary | ICD-10-CM | POA: Insufficient documentation

## 2020-08-19 DIAGNOSIS — T07XXXA Unspecified multiple injuries, initial encounter: Secondary | ICD-10-CM | POA: Diagnosis not present

## 2020-08-19 DIAGNOSIS — M25561 Pain in right knee: Secondary | ICD-10-CM | POA: Diagnosis not present

## 2020-08-19 DIAGNOSIS — S32402D Unspecified fracture of left acetabulum, subsequent encounter for fracture with routine healing: Secondary | ICD-10-CM | POA: Diagnosis not present

## 2020-08-19 DIAGNOSIS — M25552 Pain in left hip: Secondary | ICD-10-CM | POA: Insufficient documentation

## 2020-08-19 DIAGNOSIS — S62101A Fracture of unspecified carpal bone, right wrist, initial encounter for closed fracture: Secondary | ICD-10-CM

## 2020-08-19 DIAGNOSIS — M542 Cervicalgia: Secondary | ICD-10-CM | POA: Diagnosis not present

## 2020-08-19 DIAGNOSIS — S63501D Unspecified sprain of right wrist, subsequent encounter: Secondary | ICD-10-CM

## 2020-08-19 DIAGNOSIS — R52 Pain, unspecified: Secondary | ICD-10-CM

## 2020-08-19 DIAGNOSIS — Y939 Activity, unspecified: Secondary | ICD-10-CM | POA: Diagnosis not present

## 2020-08-19 DIAGNOSIS — R202 Paresthesia of skin: Secondary | ICD-10-CM | POA: Diagnosis not present

## 2020-08-19 DIAGNOSIS — Y929 Unspecified place or not applicable: Secondary | ICD-10-CM | POA: Insufficient documentation

## 2020-08-19 DIAGNOSIS — Z96652 Presence of left artificial knee joint: Secondary | ICD-10-CM | POA: Insufficient documentation

## 2020-08-19 MED ORDER — TRAMADOL HCL 50 MG PO TABS: 50.0000 mg | ORAL_TABLET | Freq: Three times a day (TID) | ORAL | 1 refills | Status: DC | PRN

## 2020-08-19 NOTE — Patient Instructions (Signed)
My Chart Phone Number   [phone] 
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
## 2020-08-20 ENCOUNTER — Ambulatory Visit: Payer: Medicaid Other | Attending: Surgery

## 2020-08-20 ENCOUNTER — Encounter: Payer: Self-pay | Admitting: Occupational Therapy

## 2020-08-20 ENCOUNTER — Ambulatory Visit: Payer: Medicaid Other | Admitting: Occupational Therapy

## 2020-08-20 DIAGNOSIS — M25631 Stiffness of right wrist, not elsewhere classified: Secondary | ICD-10-CM | POA: Diagnosis not present

## 2020-08-20 DIAGNOSIS — R41842 Visuospatial deficit: Secondary | ICD-10-CM

## 2020-08-20 DIAGNOSIS — R41844 Frontal lobe and executive function deficit: Secondary | ICD-10-CM

## 2020-08-20 DIAGNOSIS — R262 Difficulty in walking, not elsewhere classified: Secondary | ICD-10-CM

## 2020-08-20 DIAGNOSIS — M25531 Pain in right wrist: Secondary | ICD-10-CM | POA: Diagnosis not present

## 2020-08-20 DIAGNOSIS — M6281 Muscle weakness (generalized): Secondary | ICD-10-CM

## 2020-08-20 DIAGNOSIS — R41840 Attention and concentration deficit: Secondary | ICD-10-CM

## 2020-08-20 DIAGNOSIS — S065X0A Traumatic subdural hemorrhage without loss of consciousness, initial encounter: Secondary | ICD-10-CM | POA: Insufficient documentation

## 2020-08-20 DIAGNOSIS — R2681 Unsteadiness on feet: Secondary | ICD-10-CM

## 2020-08-20 DIAGNOSIS — R29818 Other symptoms and signs involving the nervous system: Secondary | ICD-10-CM

## 2020-08-20 DIAGNOSIS — R278 Other lack of coordination: Secondary | ICD-10-CM
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
## 2020-08-20 NOTE — Patient Instructions (Signed)
Access Code: [email or web address] URL: [email or web address] Date: 08/20/2020 Prepared by: Sharlynn Oliphant  Exercises Supine Heel Slide - 2 x daily - 7 x weekly - 2 sets - 10 reps
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
## 2020-08-28 DIAGNOSIS — S12090D Other displaced fracture of first cervical vertebra, subsequent encounter for fracture with routine healing: Secondary | ICD-10-CM | POA: Diagnosis not present

## 2020-09-04 ENCOUNTER — Ambulatory Visit: Payer: Medicaid Other

## 2020-09-04 ENCOUNTER — Encounter: Payer: Self-pay | Admitting: Occupational Therapy

## 2020-09-04 ENCOUNTER — Ambulatory Visit: Payer: Medicaid Other | Admitting: Occupational Therapy

## 2020-09-04 ENCOUNTER — Other Ambulatory Visit: Payer: Self-pay

## 2020-09-04 DIAGNOSIS — R278 Other lack of coordination: Secondary | ICD-10-CM

## 2020-09-04 DIAGNOSIS — M6281 Muscle weakness (generalized): Secondary | ICD-10-CM

## 2020-09-04 DIAGNOSIS — R2681 Unsteadiness on feet: Secondary | ICD-10-CM

## 2020-09-04 DIAGNOSIS — R41842 Visuospatial deficit: Secondary | ICD-10-CM | POA: Diagnosis not present

## 2020-09-04 DIAGNOSIS — M25531 Pain in right wrist: Secondary | ICD-10-CM | POA: Diagnosis not present

## 2020-09-04 DIAGNOSIS — R41844 Frontal lobe and executive function deficit: Secondary | ICD-10-CM | POA: Diagnosis not present

## 2020-09-04 DIAGNOSIS — R29818 Other symptoms and signs involving the nervous system: Secondary | ICD-10-CM | POA: Diagnosis not present

## 2020-09-04 DIAGNOSIS — S065X0A Traumatic subdural hemorrhage without loss of consciousness, initial encounter: Secondary | ICD-10-CM | POA: Diagnosis not present

## 2020-09-04 DIAGNOSIS — R41840 Attention and concentration deficit: Secondary | ICD-10-CM

## 2020-09-04 DIAGNOSIS — M25631 Stiffness of right wrist, not elsewhere classified: Secondary | ICD-10-CM

## 2020-09-04 DIAGNOSIS — R262 Difficulty in walking, not elsewhere classified: Secondary | ICD-10-CM
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
## 2020-09-04 NOTE — Patient Instructions (Addendum)
Diplopia HEP:  Perform at least 3 times per day. Stop if your eye becomes fatigued or hurts and try again later.  1. Hold a small object/card in front of you.  Hold it in the middle at arm's length away.    2. Cover your LEFT eye and look at the object with your RIGHT eye.  3. Slowly move the object side to side in front of you while continuing to watch it with your RIGHT eye.  4.  Remember to keep your head still and only move your eye.  5.  Repeat 5-10 times.  6.  Then, move object up and down while watching it 5-10 times.  7. Cover your RIGHT eye and look at the object with your LEFT eye while you repeat #1-6 above.  8.  Now, uncover both eyes and try to focus on the object while holding it in the middle.  Try to make it 1 image.   9.  If you can, try to hold it for 10-30 sec increasing as able.    10.  Once you can make the image 1 for at least 30 sec in the middle, repeat #1-6 above with both eyes moving slowly and only in the range that you can keep the image 1.    ---------------------------------------------------------------------------------------------------------------------------------------------------  AROM: Wrist Extension   .  With ____ palm down, bend wrist up. Repeat __15__ times per set.  Do __4-6__ sessions per day.    AROM: Wrist Flexion   With_____ palm up, bend wrist up. Repeat __15__ times per set.  Do _4-6___ sessions per day.   AROM: Forearm Pronation / Supination   With ____ arm in handshake position, slowly rotate palm down until stretch is felt. Relax. Then rotate palm up until stretch is felt. Repeat _15___ times per set. Do _4-6___ sessions per day.  Copyright  VHI. All rights reserved.

## 2020-09-06 ENCOUNTER — Other Ambulatory Visit: Payer: Self-pay

## 2020-09-06 ENCOUNTER — Ambulatory Visit: Payer: Medicaid Other | Attending: Internal Medicine | Admitting: Internal Medicine

## 2020-09-06 ENCOUNTER — Encounter: Payer: Self-pay | Admitting: Internal Medicine

## 2020-09-06 VITALS — BP 140/90 | HR 98 | Resp 18 | Ht 67.0 in | Wt 195.5 lb

## 2020-09-06 DIAGNOSIS — D500 Iron deficiency anemia secondary to blood loss (chronic): Secondary | ICD-10-CM | POA: Diagnosis not present

## 2020-09-06 DIAGNOSIS — M1711 Unilateral primary osteoarthritis, right knee: Secondary | ICD-10-CM

## 2020-09-06 DIAGNOSIS — Z1231 Encounter for screening mammogram for malignant neoplasm of breast: Secondary | ICD-10-CM | POA: Diagnosis not present

## 2020-09-06 DIAGNOSIS — I10 Essential (primary) hypertension: Secondary | ICD-10-CM | POA: Diagnosis not present

## 2020-09-06 DIAGNOSIS — S12000S Unspecified displaced fracture of first cervical vertebra, sequela: Secondary | ICD-10-CM | POA: Diagnosis not present

## 2020-09-06 MED ORDER — CELECOXIB 200 MG PO CAPS: 200.0000 mg | ORAL_CAPSULE | Freq: Every day | ORAL | 1 refills | Status: DC

## 2020-09-06 MED ORDER — OMEPRAZOLE 20 MG PO CPDR: 20.0000 mg | DELAYED_RELEASE_CAPSULE | Freq: Every day | ORAL | 1 refills | Status: DC

## 2020-09-06 NOTE — Patient Instructions (Signed)
Please take your blood pressure medication as soon as you return home.  Please take your iron tablet daily until you get into see the hematologist Dr. Irene Limbo.  I have sent refill for you on Celebrex the arthritis medication.  You should take it with the stomach medication called omeprazole to help protect the lining of your stomach.

## 2020-09-06 NOTE — Progress Notes (Signed)
Pt stated tha she has not taking her blood pressure medication today.
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
## 2020-09-08 DIAGNOSIS — S065X9A Traumatic subdural hemorrhage with loss of consciousness of unspecified duration, initial encounter: Secondary | ICD-10-CM | POA: Diagnosis not present

## 2020-09-11 ENCOUNTER — Ambulatory Visit: Payer: Medicaid Other | Admitting: Occupational Therapy

## 2020-09-11 ENCOUNTER — Other Ambulatory Visit: Payer: Self-pay

## 2020-09-11 ENCOUNTER — Ambulatory Visit: Payer: Medicaid Other

## 2020-09-11 DIAGNOSIS — S065X0A Traumatic subdural hemorrhage without loss of consciousness, initial encounter: Secondary | ICD-10-CM | POA: Diagnosis not present

## 2020-09-11 DIAGNOSIS — R41844 Frontal lobe and executive function deficit: Secondary | ICD-10-CM | POA: Diagnosis not present

## 2020-09-11 DIAGNOSIS — R278 Other lack of coordination: Secondary | ICD-10-CM | POA: Diagnosis not present

## 2020-09-11 DIAGNOSIS — R29818 Other symptoms and signs involving the nervous system: Secondary | ICD-10-CM | POA: Diagnosis not present

## 2020-09-11 DIAGNOSIS — R2681 Unsteadiness on feet: Secondary | ICD-10-CM

## 2020-09-11 DIAGNOSIS — R41840 Attention and concentration deficit: Secondary | ICD-10-CM | POA: Diagnosis not present

## 2020-09-11 DIAGNOSIS — M25631 Stiffness of right wrist, not elsewhere classified: Secondary | ICD-10-CM | POA: Diagnosis not present

## 2020-09-11 DIAGNOSIS — M6281 Muscle weakness (generalized): Secondary | ICD-10-CM

## 2020-09-11 DIAGNOSIS — M25531 Pain in right wrist: Secondary | ICD-10-CM | POA: Diagnosis not present

## 2020-09-11 DIAGNOSIS — R262 Difficulty in walking, not elsewhere classified: Secondary | ICD-10-CM | POA: Diagnosis not present

## 2020-09-11 DIAGNOSIS — R41842 Visuospatial deficit: Secondary | ICD-10-CM | POA: Diagnosis not present
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
## 2020-09-13 ENCOUNTER — Other Ambulatory Visit: Payer: Medicaid Other

## 2020-09-13 ENCOUNTER — Other Ambulatory Visit: Payer: Self-pay

## 2020-09-13 ENCOUNTER — Other Ambulatory Visit: Payer: Self-pay | Admitting: Internal Medicine

## 2020-09-13 MED ORDER — OMEPRAZOLE 20 MG PO CPDR: 20.0000 mg | DELAYED_RELEASE_CAPSULE | Freq: Every day | ORAL | 1 refills | Status: DC

## 2020-09-13 MED ORDER — CELECOXIB 200 MG PO CAPS: 200.0000 mg | ORAL_CAPSULE | Freq: Every day | ORAL | 1 refills | Status: DC

## 2020-09-13 NOTE — Patient Outreach (Signed)
Removed ASA from refills since off her meds list on Epic. Asked Dr. Wynetta Emery how to proceed

## 2020-09-13 NOTE — Patient Outreach (Signed)
Patient requesting refills for 09/16/20  Amlodipine 10mg   Aspirin Chewable 81mg   Celecoxib 200mg   Duloxetine 30mg   Methocarbamol 500mg   Omeprazole 20mg      Requested Celecoxib from Dr. Wynetta Emery

## 2020-09-15 DIAGNOSIS — Z419 Encounter for procedure for purposes other than remedying health state, unspecified: Secondary | ICD-10-CM | POA: Diagnosis not present

## 2020-09-16 ENCOUNTER — Telehealth: Payer: Self-pay | Admitting: Hematology

## 2020-09-16 NOTE — Telephone Encounter (Signed)
Scheduled appts per 4/29 sch msg. Pt aware.  
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
## 2020-09-17 ENCOUNTER — Telehealth: Payer: Medicaid Other | Admitting: Internal Medicine

## 2020-09-17 ENCOUNTER — Inpatient Hospital Stay: Payer: Medicaid Other | Attending: Hematology and Oncology

## 2020-09-17 ENCOUNTER — Inpatient Hospital Stay (HOSPITAL_BASED_OUTPATIENT_CLINIC_OR_DEPARTMENT_OTHER): Payer: Medicaid Other | Admitting: Hematology

## 2020-09-17 ENCOUNTER — Other Ambulatory Visit: Payer: Self-pay

## 2020-09-17 ENCOUNTER — Telehealth: Payer: Self-pay

## 2020-09-17 VITALS — BP 124/90 | HR 87 | Temp 96.3°F | Resp 18

## 2020-09-17 DIAGNOSIS — D500 Iron deficiency anemia secondary to blood loss (chronic): Secondary | ICD-10-CM

## 2020-09-17 DIAGNOSIS — F17210 Nicotine dependence, cigarettes, uncomplicated: Secondary | ICD-10-CM | POA: Diagnosis not present

## 2020-09-17 DIAGNOSIS — D509 Iron deficiency anemia, unspecified: Secondary | ICD-10-CM | POA: Insufficient documentation

## 2020-09-17 DIAGNOSIS — Z96652 Presence of left artificial knee joint: Secondary | ICD-10-CM | POA: Insufficient documentation

## 2020-09-17 DIAGNOSIS — Z801 Family history of malignant neoplasm of trachea, bronchus and lung: Secondary | ICD-10-CM | POA: Diagnosis not present

## 2020-09-17 LAB — CBC WITH DIFFERENTIAL/PLATELET
Abs Immature Granulocytes: 0.01 10*3/uL (ref 0.00–0.07)
Basophils Absolute: 0.1 10*3/uL (ref 0.0–0.1)
Basophils Relative: 1 %
Eosinophils Absolute: 0.2 10*3/uL (ref 0.0–0.5)
Eosinophils Relative: 3 %
HCT: 33 % — ABNORMAL LOW (ref 36.0–46.0)
Hemoglobin: 10.8 g/dL — ABNORMAL LOW (ref 12.0–15.0)
Immature Granulocytes: 0 %
Lymphocytes Relative: 53 %
Lymphs Abs: 3 10*3/uL (ref 0.7–4.0)
MCH: 29.3 pg (ref 26.0–34.0)
MCHC: 32.7 g/dL (ref 30.0–36.0)
MCV: 89.7 fL (ref 80.0–100.0)
Monocytes Absolute: 0.7 10*3/uL (ref 0.1–1.0)
Monocytes Relative: 12 %
Neutro Abs: 1.8 10*3/uL (ref 1.7–7.7)
Neutrophils Relative %: 31 %
Platelets: 216 10*3/uL (ref 150–400)
RBC: 3.68 MIL/uL — ABNORMAL LOW (ref 3.87–5.11)
RDW: 15.2 % (ref 11.5–15.5)
WBC: 5.6 10*3/uL (ref 4.0–10.5)
nRBC: 0 % (ref 0.0–0.2)

## 2020-09-17 LAB — IRON AND TIBC
Iron: 75 ug/dL (ref 28–170)
Saturation Ratios: 21 % (ref 10.4–31.8)
TIBC: 362 ug/dL (ref 250–450)
UIBC: 287 ug/dL

## 2020-09-17 LAB — FERRITIN: Ferritin: 57 ng/mL (ref 11–307)

## 2020-09-17 NOTE — Telephone Encounter (Signed)
Copied from [place] [phone]. Topic: General - Other >> Sep 16, 2020  5:00 PM Mcneil, Ja-Kwan wrote: Reason for CRM: Pt would like to know if Dr. Wynetta Emery could get her assistance with transportation to and from her appts as well as assistance at her home. Pt stated she has trouble with her knees that Dr. Wynetta Emery is aware of. Pt requests call back

## 2020-09-17 NOTE — Telephone Encounter (Signed)
Following up on a request transportation resources. Verified patients name and date of birth. Patient is requesting for an aid to assist her during medical visits. CM did share with the patient how to access transportation benefits through Medicaid. Patient shared her daughters having a had time getting off of work to take her to medical appointments. CM encouraged the patient to reach out to and inquire on her coverage, ask the length of time to schedule transport. CM shared for the patient to contact the office and ask for CM if there are further questions.

## 2020-09-17 NOTE — Telephone Encounter (Signed)
Reached out to the patient to discuss accessing transportation benefits through her Medicaid. Patient stated there is not much assistance with using [employer].

## 2020-09-18 ENCOUNTER — Encounter: Payer: Medicaid Other | Admitting: Occupational Therapy

## 2020-09-18 ENCOUNTER — Ambulatory Visit: Payer: Medicaid Other

## 2020-09-25 ENCOUNTER — Telehealth: Payer: Self-pay

## 2020-09-25 ENCOUNTER — Ambulatory Visit: Payer: Medicaid Other | Admitting: Occupational Therapy

## 2020-09-25 ENCOUNTER — Telehealth: Payer: Self-pay | Admitting: Occupational Therapy

## 2020-09-25 ENCOUNTER — Other Ambulatory Visit: Payer: Self-pay

## 2020-09-25 ENCOUNTER — Ambulatory Visit: Payer: Medicaid Other | Attending: Surgery

## 2020-09-25 DIAGNOSIS — S065X0A Traumatic subdural hemorrhage without loss of consciousness, initial encounter: Secondary | ICD-10-CM | POA: Diagnosis not present

## 2020-09-25 DIAGNOSIS — R262 Difficulty in walking, not elsewhere classified: Secondary | ICD-10-CM | POA: Diagnosis not present

## 2020-09-25 DIAGNOSIS — M6281 Muscle weakness (generalized): Secondary | ICD-10-CM

## 2020-09-25 DIAGNOSIS — R278 Other lack of coordination: Secondary | ICD-10-CM

## 2020-09-25 DIAGNOSIS — M25631 Stiffness of right wrist, not elsewhere classified: Secondary | ICD-10-CM

## 2020-09-25 DIAGNOSIS — R41840 Attention and concentration deficit: Secondary | ICD-10-CM

## 2020-09-25 DIAGNOSIS — R41844 Frontal lobe and executive function deficit: Secondary | ICD-10-CM | POA: Diagnosis not present

## 2020-09-25 DIAGNOSIS — R29818 Other symptoms and signs involving the nervous system: Secondary | ICD-10-CM

## 2020-09-25 DIAGNOSIS — M25531 Pain in right wrist: Secondary | ICD-10-CM

## 2020-09-25 DIAGNOSIS — R41842 Visuospatial deficit: Secondary | ICD-10-CM | POA: Diagnosis not present

## 2020-09-25 DIAGNOSIS — R2681 Unsteadiness on feet: Secondary | ICD-10-CM | POA: Diagnosis not present
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
## 2020-09-25 NOTE — Telephone Encounter (Signed)
Erskine Emery, PA-C,   Elizabeth Bush is receiving occupational therapy at our site. She stopped wearing her wrist brace several weeks ago and now she is reporting wrist pain and she demonstrates edema.(However she reports using her RUE for heavier tasks such as picking up her dog).  She has been issued a new brace for comfort as her previous splint was damaged. Pt was instructed to wear only for heavier tasks.  We have issued A/ROM exercises for her wrist and forearm.  Will you please clarify weightbearing status for RUE as she is currently using a platform walker.  Can we progress to a traditional walker?  When can she progress to P/ROM and strengthening for the right wrist?  Thanks, [employer], OTR/L

## 2020-09-25 NOTE — Telephone Encounter (Signed)
Copied from [facility] [id number]. Topic: General - Other >> Sep 24, 2020 10:37 AM Tessa Lerner A wrote: Reason for CRM: Patient would like a prescription for a new walker  Patient would like the prescription sent to [facility] in [facility], [place]  Patient has been prescribed a walker previously but has concerns with walker's ability to support them  Please contact to further advise if needed

## 2020-09-25 NOTE — Patient Instructions (Signed)
Wear wrist brace during heavier activities or if pain increases  Remove brace for exercises and for light activities  Remove brace for bathing  Do not wear brace all the time or your wrist will get stiff.

## 2020-09-26 ENCOUNTER — Encounter: Payer: Self-pay | Admitting: Physician Assistant

## 2020-09-26 ENCOUNTER — Ambulatory Visit (INDEPENDENT_AMBULATORY_CARE_PROVIDER_SITE_OTHER): Payer: Medicaid Other | Admitting: Physician Assistant

## 2020-09-26 DIAGNOSIS — S62101A Fracture of unspecified carpal bone, right wrist, initial encounter for closed fracture: Secondary | ICD-10-CM | POA: Insufficient documentation

## 2020-09-26 DIAGNOSIS — M1711 Unilateral primary osteoarthritis, right knee: Secondary | ICD-10-CM | POA: Diagnosis not present

## 2020-09-26 DIAGNOSIS — S32425D Nondisplaced fracture of posterior wall of left acetabulum, subsequent encounter for fracture with routine healing: Secondary | ICD-10-CM

## 2020-09-26 DIAGNOSIS — M25561 Pain in right knee: Secondary | ICD-10-CM | POA: Diagnosis not present

## 2020-09-26 MED ORDER — LIDOCAINE HCL 1 % IJ SOLN
3.0000 mL | INTRAMUSCULAR | Status: AC | PRN
Start: 2020-09-26 — End: 2020-09-26
  Administered 2020-09-26: 3 mL

## 2020-09-26 MED ORDER — METHYLPREDNISOLONE ACETATE 40 MG/ML IJ SUSP
40.0000 mg | INTRAMUSCULAR | Status: AC | PRN
  Administered 2020-09-26: 40 mg via INTRA_ARTICULAR

## 2020-09-26 NOTE — Progress Notes (Signed)
        Patient: Elizabeth Bush             Date of Birth: [birth year]           MRN: [id number]             Visit Date: 09/26/2020  HPI: Mrs. Vanpelt returns today for follow-up of her right wrist fracture, left acetabular fracture and right knee pain.  She is working with physical therapy on gait and balance.  She states she is having increased pain in her right knee.  She has known end-stage arthritis of the right knee.  Right knee knee is giving way on her.  Regards to her head she is placed most of her weight on it she has been following hip precautions.  She is walking with a platform rolling walker.  Discussed with physical therapy yesterday and in regards to the wrist she can wear the wrist brace when lifting heavy objects but otherwise she can begin to wean out of the wrist brace they can work on active and passive range of motion of the wrist.  He will discontinue the platform walker and just go to a walker slowly advance her to a cane. Patient is asking for a cortisone injection in the right knee today.  She is also asking about a knee brace.  Physical exam: General well-developed well-nourished female no acute distress mood affect appropriate. Left hiphip: Good range of motion without pain. Right wrist: Good range of motion Slight tenderness over distal radius.  Right knee: no abnormal warmth or erythema. Obvious varus deformity.     Procedures: Visit Diagnoses:  1. Unilateral primary osteoarthritis, right knee   2. Closed nondisplaced fracture of posterior wall of left acetabulum with routine healing, subsequent encounter   3. Closed fracture of right wrist, initial encounter     Large Joint Inj: R knee on 09/26/2020 4:52 PM Indications: pain Details: 22 G 1.5 in needle, anterolateral approach  Arthrogram: No  Medications: 3 mL lidocaine 1 %; 40 mg methylPREDNISolone acetate 40 MG/ML Outcome: tolerated well, no immediate complications Procedure, treatment alternatives, risks  and benefits explained, specific risks discussed. Consent was given by the patient. Immediately prior to procedure a time out was called to verify the correct patient, procedure, equipment, support staff and site/side marked as required. Patient was prepped and draped in the usual sterile fashion.     Plan: She is given a knee brace for the right knee.  We will discontinue the anterior hip precautions on the left.  She will continue work with therapy on gait balance and strengthening lower extremities.  She can follow-up with [place] on an as-needed basis or if there is any questions or concerns.  She may delay at least 3 months before having a another cortisone injection.  Ultimately she will require right total knee replacement.  Questions were encouraged and answered at length today.

## 2020-09-27 ENCOUNTER — Other Ambulatory Visit: Payer: Self-pay

## 2020-09-27 DIAGNOSIS — M170 Bilateral primary osteoarthritis of knee: Secondary | ICD-10-CM

## 2020-09-27 MED ORDER — MISC. DEVICES MISC: 0 refills | Status: DC

## 2020-09-30 DIAGNOSIS — H40023 Open angle with borderline findings, high risk, bilateral: Secondary | ICD-10-CM | POA: Diagnosis not present

## 2020-09-30 DIAGNOSIS — E119 Type 2 diabetes mellitus without complications: Secondary | ICD-10-CM | POA: Diagnosis not present

## 2020-09-30 DIAGNOSIS — H25813 Combined forms of age-related cataract, bilateral: Secondary | ICD-10-CM | POA: Diagnosis not present

## 2020-09-30 LAB — HM DIABETES EYE EXAM

## 2020-09-30 NOTE — Telephone Encounter (Signed)
Rx has [facility] faxed to [facility]

## 2020-10-01 ENCOUNTER — Encounter: Payer: Medicaid Other | Attending: Registered Nurse | Admitting: Physical Medicine & Rehabilitation

## 2020-10-01 ENCOUNTER — Other Ambulatory Visit: Payer: Self-pay

## 2020-10-01 ENCOUNTER — Encounter: Payer: Self-pay | Admitting: Physical Medicine & Rehabilitation

## 2020-10-01 VITALS — BP 158/91 | HR 91 | Temp 98.0°F | Ht 67.0 in | Wt 208.0 lb

## 2020-10-01 DIAGNOSIS — R32 Unspecified urinary incontinence: Secondary | ICD-10-CM | POA: Diagnosis not present

## 2020-10-01 DIAGNOSIS — Z79891 Long term (current) use of opiate analgesic: Secondary | ICD-10-CM | POA: Insufficient documentation

## 2020-10-01 DIAGNOSIS — G3184 Mild cognitive impairment, so stated: Secondary | ICD-10-CM | POA: Diagnosis not present

## 2020-10-01 DIAGNOSIS — Z5181 Encounter for therapeutic drug level monitoring: Secondary | ICD-10-CM | POA: Diagnosis not present

## 2020-10-01 DIAGNOSIS — G894 Chronic pain syndrome: Secondary | ICD-10-CM

## 2020-10-01 DIAGNOSIS — IMO0001 Reserved for inherently not codable concepts without codable children: Secondary | ICD-10-CM

## 2020-10-01 DIAGNOSIS — S069X9S Unspecified intracranial injury with loss of consciousness of unspecified duration, sequela: Secondary | ICD-10-CM | POA: Insufficient documentation
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
## 2020-10-01 NOTE — Patient Instructions (Signed)
[facility] you have a prescription for celebrex 200mg   90 tablets with a refill

## 2020-10-02 ENCOUNTER — Ambulatory Visit: Payer: Medicaid Other | Admitting: Occupational Therapy

## 2020-10-02 ENCOUNTER — Encounter: Payer: Self-pay | Admitting: Occupational Therapy

## 2020-10-02 ENCOUNTER — Ambulatory Visit: Payer: Medicaid Other

## 2020-10-02 DIAGNOSIS — M25631 Stiffness of right wrist, not elsewhere classified: Secondary | ICD-10-CM

## 2020-10-02 DIAGNOSIS — R41840 Attention and concentration deficit: Secondary | ICD-10-CM

## 2020-10-02 DIAGNOSIS — M6281 Muscle weakness (generalized): Secondary | ICD-10-CM

## 2020-10-02 DIAGNOSIS — R2681 Unsteadiness on feet: Secondary | ICD-10-CM

## 2020-10-02 DIAGNOSIS — M25531 Pain in right wrist: Secondary | ICD-10-CM

## 2020-10-02 DIAGNOSIS — R41844 Frontal lobe and executive function deficit: Secondary | ICD-10-CM

## 2020-10-02 DIAGNOSIS — R29818 Other symptoms and signs involving the nervous system: Secondary | ICD-10-CM | POA: Diagnosis not present

## 2020-10-02 DIAGNOSIS — S065X0A Traumatic subdural hemorrhage without loss of consciousness, initial encounter: Secondary | ICD-10-CM | POA: Diagnosis not present

## 2020-10-02 DIAGNOSIS — R278 Other lack of coordination: Secondary | ICD-10-CM

## 2020-10-02 DIAGNOSIS — R262 Difficulty in walking, not elsewhere classified: Secondary | ICD-10-CM

## 2020-10-02 DIAGNOSIS — R41842 Visuospatial deficit: Secondary | ICD-10-CM | POA: Diagnosis not present
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
## 2020-10-02 NOTE — Patient Instructions (Signed)
PROM: Wrist Flexion / Extension   Grasp  hand and slowly bend wrist until stretch is felt. Relax. Then stretch as far as possible in opposite direction. Be sure to keep elbow bent.  Hold __10__ sec. each way Repeat _5___ times per set.    Do _4-6___ sessions per day.  Pronation (Passive)   Keep elbow bent at right angle and held firmly to side. Use other hand to turn forearm until palm faces downward. Hold _10___ seconds. Repeat __5__ times. Do _4-6___ sessions per day.  Supination (Passive)   Keep elbow bent at right angle and held firmly at side. Use other hand to turn forearm until palm faces upward. Hold __10__ seconds. Repeat __5__ times. Do _4-6___ sessions per day.  Copyright  VHI. All rights reserved.
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
## 2020-10-07 ENCOUNTER — Other Ambulatory Visit: Payer: Self-pay

## 2020-10-07 ENCOUNTER — Other Ambulatory Visit: Payer: Self-pay | Admitting: Orthopaedic Surgery

## 2020-10-07 NOTE — Patient Outreach (Signed)
Patient requesting refill of Celecoxib to be delivered 10/09/20

## 2020-10-08 ENCOUNTER — Telehealth: Payer: Self-pay | Admitting: *Deleted

## 2020-10-08 DIAGNOSIS — S065X9A Traumatic subdural hemorrhage with loss of consciousness of unspecified duration, initial encounter: Secondary | ICD-10-CM | POA: Diagnosis not present

## 2020-10-08 LAB — TOXASSURE SELECT,+ANTIDEPR,UR

## 2020-10-08 NOTE — Telephone Encounter (Signed)
Not sure if we will be prescribing narcotics on a long-term basis with this patient.

## 2020-10-08 NOTE — Telephone Encounter (Signed)
Urine drug screen is negative for any controlled substances, but is positive for alcohol.

## 2020-10-09 ENCOUNTER — Ambulatory Visit: Payer: Medicaid Other | Admitting: Occupational Therapy

## 2020-10-09 ENCOUNTER — Other Ambulatory Visit: Payer: Self-pay

## 2020-10-09 ENCOUNTER — Encounter: Payer: Self-pay | Admitting: *Deleted

## 2020-10-09 ENCOUNTER — Ambulatory Visit: Payer: Medicaid Other

## 2020-10-09 DIAGNOSIS — R262 Difficulty in walking, not elsewhere classified: Secondary | ICD-10-CM

## 2020-10-09 DIAGNOSIS — M25631 Stiffness of right wrist, not elsewhere classified: Secondary | ICD-10-CM

## 2020-10-09 DIAGNOSIS — R29818 Other symptoms and signs involving the nervous system: Secondary | ICD-10-CM | POA: Diagnosis not present

## 2020-10-09 DIAGNOSIS — R2681 Unsteadiness on feet: Secondary | ICD-10-CM | POA: Diagnosis not present

## 2020-10-09 DIAGNOSIS — S065X0A Traumatic subdural hemorrhage without loss of consciousness, initial encounter: Secondary | ICD-10-CM | POA: Diagnosis not present

## 2020-10-09 DIAGNOSIS — R41840 Attention and concentration deficit: Secondary | ICD-10-CM

## 2020-10-09 DIAGNOSIS — R278 Other lack of coordination: Secondary | ICD-10-CM | POA: Diagnosis not present

## 2020-10-09 DIAGNOSIS — M6281 Muscle weakness (generalized): Secondary | ICD-10-CM

## 2020-10-09 DIAGNOSIS — R41844 Frontal lobe and executive function deficit: Secondary | ICD-10-CM | POA: Diagnosis not present

## 2020-10-09 DIAGNOSIS — R41842 Visuospatial deficit: Secondary | ICD-10-CM | POA: Diagnosis not present

## 2020-10-09 DIAGNOSIS — M25531 Pain in right wrist: Secondary | ICD-10-CM | POA: Diagnosis not present
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
## 2020-10-09 NOTE — Patient Outreach (Addendum)
10/16/20 due for delivery  Medication BB B L EM BT  Amlodipine  1     Duloxetine 30mg   1     Methocarbamol 500mg        Omeprazole 20mg   1       F/U 1 Month
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
## 2020-10-16 ENCOUNTER — Ambulatory Visit: Payer: Medicaid Other | Attending: Surgery

## 2020-10-16 ENCOUNTER — Ambulatory Visit: Payer: Medicaid Other | Admitting: Occupational Therapy

## 2020-10-16 ENCOUNTER — Other Ambulatory Visit: Payer: Self-pay

## 2020-10-16 ENCOUNTER — Encounter: Payer: Self-pay | Admitting: Occupational Therapy

## 2020-10-16 DIAGNOSIS — M25631 Stiffness of right wrist, not elsewhere classified: Secondary | ICD-10-CM

## 2020-10-16 DIAGNOSIS — R41840 Attention and concentration deficit: Secondary | ICD-10-CM | POA: Diagnosis not present

## 2020-10-16 DIAGNOSIS — R262 Difficulty in walking, not elsewhere classified: Secondary | ICD-10-CM | POA: Diagnosis not present

## 2020-10-16 DIAGNOSIS — Z419 Encounter for procedure for purposes other than remedying health state, unspecified: Secondary | ICD-10-CM | POA: Diagnosis not present

## 2020-10-16 DIAGNOSIS — R41844 Frontal lobe and executive function deficit: Secondary | ICD-10-CM | POA: Diagnosis not present

## 2020-10-16 DIAGNOSIS — R278 Other lack of coordination: Secondary | ICD-10-CM

## 2020-10-16 DIAGNOSIS — R29818 Other symptoms and signs involving the nervous system: Secondary | ICD-10-CM | POA: Diagnosis not present

## 2020-10-16 DIAGNOSIS — M25531 Pain in right wrist: Secondary | ICD-10-CM | POA: Diagnosis not present

## 2020-10-16 DIAGNOSIS — R2681 Unsteadiness on feet: Secondary | ICD-10-CM

## 2020-10-16 DIAGNOSIS — M6281 Muscle weakness (generalized): Secondary | ICD-10-CM | POA: Diagnosis not present
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
## 2020-10-16 NOTE — Patient Instructions (Signed)
1. Grip Strengthening (Resistive Putty)   Squeeze putty using thumb and all fingers. Repeat _20___ times. Do __2__ sessions per day.   2. Roll putty into tube on table and pinch between each finger and thumb x 10 reps each. (can do ring and small finger together)     Copyright  VHI. All rights reserved.   
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
## 2020-10-23 ENCOUNTER — Ambulatory Visit: Payer: Medicaid Other | Admitting: Speech Pathology

## 2020-10-23 ENCOUNTER — Other Ambulatory Visit: Payer: Self-pay

## 2020-10-23 ENCOUNTER — Encounter: Payer: Self-pay | Admitting: Occupational Therapy

## 2020-10-23 ENCOUNTER — Ambulatory Visit: Payer: Medicaid Other

## 2020-10-23 ENCOUNTER — Ambulatory Visit: Payer: Medicaid Other | Admitting: Occupational Therapy

## 2020-10-23 ENCOUNTER — Other Ambulatory Visit: Payer: Self-pay | Admitting: *Deleted

## 2020-10-23 DIAGNOSIS — R2681 Unsteadiness on feet: Secondary | ICD-10-CM | POA: Diagnosis not present

## 2020-10-23 DIAGNOSIS — R29818 Other symptoms and signs involving the nervous system: Secondary | ICD-10-CM

## 2020-10-23 DIAGNOSIS — R41844 Frontal lobe and executive function deficit: Secondary | ICD-10-CM

## 2020-10-23 DIAGNOSIS — M25631 Stiffness of right wrist, not elsewhere classified: Secondary | ICD-10-CM | POA: Diagnosis not present

## 2020-10-23 DIAGNOSIS — M6281 Muscle weakness (generalized): Secondary | ICD-10-CM

## 2020-10-23 DIAGNOSIS — R278 Other lack of coordination: Secondary | ICD-10-CM | POA: Diagnosis not present

## 2020-10-23 DIAGNOSIS — M25531 Pain in right wrist: Secondary | ICD-10-CM

## 2020-10-23 DIAGNOSIS — R41840 Attention and concentration deficit: Secondary | ICD-10-CM | POA: Diagnosis not present

## 2020-10-23 DIAGNOSIS — R262 Difficulty in walking, not elsewhere classified: Secondary | ICD-10-CM | POA: Diagnosis not present
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
## 2020-10-30 ENCOUNTER — Ambulatory Visit: Payer: Medicaid Other

## 2020-10-30 ENCOUNTER — Encounter: Payer: Medicaid Other | Admitting: Occupational Therapy

## 2020-10-30 DIAGNOSIS — S12090D Other displaced fracture of first cervical vertebra, subsequent encounter for fracture with routine healing: Secondary | ICD-10-CM | POA: Diagnosis not present

## 2020-10-31 ENCOUNTER — Encounter: Payer: Medicaid Other | Admitting: Occupational Therapy

## 2020-10-31 ENCOUNTER — Ambulatory Visit: Payer: Medicaid Other

## 2020-10-31 NOTE — Telephone Encounter (Signed)
Per Dr Letta Pate : Needs Warning letter for alcohol use . Letter sent through [place].

## 2020-11-03 ENCOUNTER — Other Ambulatory Visit: Payer: Self-pay | Admitting: Internal Medicine

## 2020-11-03 NOTE — Telephone Encounter (Signed)
Requested Prescriptions  Pending Prescriptions Disp Refills  . DULoxetine (CYMBALTA) 30 MG capsule [Pharmacy Med Name: duloxetine 30 mg capsule,delayed release] 90 capsule 0    Sig: TAKE ONE CAPSULE BY MOUTH EVERY MORNING     Psychiatry: Antidepressants - SNRI Failed - 11/03/2020  8:04 AM      Failed - Last BP in normal range    BP Readings from Last 1 Encounters:  10/01/20 (!) 158/91         Passed - Completed PHQ-2 or PHQ-9 in the last 360 days      Passed - Valid encounter within last 6 months    Recent Outpatient Visits          1 month ago Essential hypertension   [place], Deborah B, MD   6 months ago Alcohol abuse   [place], MD   9 months ago Need for influenza vaccination   [place], Jarome Matin, RPH-CPP   9 months ago Moderate episode of recurrent major depressive disorder [facility])   [place], MD   10 months ago Primary osteoarthritis of both knees   [place], MD      Future Appointments            In 2 months Ladell Pier, MD [place]           '

## 2020-11-07 ENCOUNTER — Encounter: Payer: Medicaid Other | Attending: Registered Nurse | Admitting: Physical Medicine & Rehabilitation

## 2020-11-07 ENCOUNTER — Other Ambulatory Visit: Payer: Self-pay

## 2020-11-07 VITALS — BP 126/74 | HR 101 | Temp 97.9°F | Ht 67.0 in | Wt 209.0 lb

## 2020-11-07 DIAGNOSIS — Z96652 Presence of left artificial knee joint: Secondary | ICD-10-CM | POA: Diagnosis not present

## 2020-11-07 DIAGNOSIS — S065X0D Traumatic subdural hemorrhage without loss of consciousness, subsequent encounter: Secondary | ICD-10-CM | POA: Diagnosis not present

## 2020-11-07 DIAGNOSIS — S069X9S Unspecified intracranial injury with loss of consciousness of unspecified duration, sequela: Secondary | ICD-10-CM | POA: Insufficient documentation

## 2020-11-07 DIAGNOSIS — R600 Localized edema: Secondary | ICD-10-CM | POA: Insufficient documentation

## 2020-11-07 DIAGNOSIS — Z9989 Dependence on other enabling machines and devices: Secondary | ICD-10-CM | POA: Insufficient documentation

## 2020-11-07 DIAGNOSIS — M170 Bilateral primary osteoarthritis of knee: Secondary | ICD-10-CM | POA: Insufficient documentation

## 2020-11-07 DIAGNOSIS — IMO0001 Reserved for inherently not codable concepts without codable children: Secondary | ICD-10-CM

## 2020-11-07 DIAGNOSIS — G3184 Mild cognitive impairment, so stated: Secondary | ICD-10-CM | POA: Insufficient documentation
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
## 2020-11-07 NOTE — Patient Instructions (Signed)
May try voltaren gel to Right knee 4 times a day   See Dr Ninfa Linden for Right knee replacement  See Dr Wynetta Emery for foot swelling

## 2020-11-08 ENCOUNTER — Other Ambulatory Visit: Payer: Medicaid Other

## 2020-11-08 DIAGNOSIS — S065X9A Traumatic subdural hemorrhage with loss of consciousness of unspecified duration, initial encounter: Secondary | ICD-10-CM | POA: Diagnosis not present

## 2020-11-08 NOTE — Patient Outreach (Signed)
Due on 11/14/20  Duloxetine   30 mg  1         Amlodipine   10 mg  1                       Celecoxib   200 mg        Send in vial     States has enough muscle relaxer. F/U 1 Month

## 2020-11-13 DIAGNOSIS — R32 Unspecified urinary incontinence: Secondary | ICD-10-CM | POA: Diagnosis not present

## 2020-11-14 ENCOUNTER — Telehealth: Payer: Self-pay | Admitting: Orthopaedic Surgery

## 2020-11-14 NOTE — Telephone Encounter (Signed)
Patient asked if she can get scheduled for surgery for her right leg? Patient said she is in a lot of pain and can not take the pain anymore. The number to contact patient is [phone]

## 2020-11-14 NOTE — Telephone Encounter (Signed)
Please advise 

## 2020-11-14 NOTE — Telephone Encounter (Signed)
Called and scheduled f/u.

## 2020-11-15 DIAGNOSIS — Z419 Encounter for procedure for purposes other than remedying health state, unspecified: Secondary | ICD-10-CM | POA: Diagnosis not present

## 2020-11-20 ENCOUNTER — Other Ambulatory Visit: Payer: Self-pay

## 2020-11-20 ENCOUNTER — Other Ambulatory Visit: Payer: Self-pay | Admitting: Internal Medicine

## 2020-11-20 ENCOUNTER — Ambulatory Visit
Admission: RE | Admit: 2020-11-20 | Discharge: 2020-11-20 | Disposition: A | Discharge status: Home / Self Care | Payer: Medicaid Other | Source: Ambulatory Visit | Attending: Internal Medicine | Admitting: Internal Medicine

## 2020-11-20 DIAGNOSIS — Z1231 Encounter for screening mammogram for malignant neoplasm of breast: Secondary | ICD-10-CM

## 2020-11-25 ENCOUNTER — Ambulatory Visit (INDEPENDENT_AMBULATORY_CARE_PROVIDER_SITE_OTHER): Payer: Medicaid Other | Admitting: Orthopaedic Surgery

## 2020-11-25 DIAGNOSIS — G8929 Other chronic pain: Secondary | ICD-10-CM | POA: Diagnosis not present

## 2020-11-25 DIAGNOSIS — M25561 Pain in right knee: Secondary | ICD-10-CM

## 2020-11-25 DIAGNOSIS — M1711 Unilateral primary osteoarthritis, right knee: Secondary | ICD-10-CM
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
## 2020-11-26 ENCOUNTER — Other Ambulatory Visit: Payer: Self-pay | Admitting: Orthopaedic Surgery

## 2020-11-26 ENCOUNTER — Telehealth: Payer: Self-pay | Admitting: Orthopaedic Surgery

## 2020-11-26 ENCOUNTER — Other Ambulatory Visit: Payer: Self-pay | Admitting: *Deleted

## 2020-11-26 MED ORDER — ACETAMINOPHEN-CODEINE #3 300-30 MG PO TABS: 1.0000 | ORAL_TABLET | Freq: Three times a day (TID) | ORAL | 0 refills | Status: DC | PRN

## 2020-11-26 NOTE — Patient Outreach (Signed)
Care Coordination  11/26/2020  Elizabeth Bush 1971-02-21 [id number]   Medicaid Managed Care   Unsuccessful Outreach Note  11/26/2020 Name: Elizabeth Bush MRN: [id number] DOB: 03/14/1971  Referred by: Ladell Pier, MD Reason for referral : High Risk Managed Medicaid (Unsuccessful RNCM follow up outreach)   An unsuccessful telephone outreach was attempted today. The patient was referred to the case management team for assistance with care management and care coordination.   Follow Up Plan: Telephone follow up appointment with care management team member scheduled for: A HIPAA compliant phone message was left for the patient providing contact information and requesting a return call.  The care management team will reach out to the patient again over the next 14 days.   [name], BSN [place]  [employer] manager

## 2020-11-26 NOTE — Patient Instructions (Signed)
Visit Information  [name]  - as a part of your Medicaid benefit, you are eligible for care management and care coordination services at no cost or copay. I was unable to reach you by phone today but would be happy to help you with your health related needs. Please feel free to call me @ [phone].   A member of the Managed Medicaid care management team will reach out to you again over the next 7-14 days on 12/05/20 @ 11:30am.   [name], [place] RN Care Coordinator

## 2020-11-26 NOTE — Telephone Encounter (Signed)
Pt calling wanting a prescription for tylenol 3. Pt states they talked about that in her appt yesterday and was asking to get it called in. The best pharmacy is [facility] [address]. The best call back number if needed is [phone].

## 2020-12-05 ENCOUNTER — Other Ambulatory Visit: Payer: Medicaid Other

## 2020-12-05 ENCOUNTER — Other Ambulatory Visit: Payer: Self-pay | Admitting: *Deleted

## 2020-12-05 NOTE — Patient Outreach (Signed)
Care Coordination  12/05/2020  [name] 09/16/1970 [id number]   Medicaid Managed Care   Unsuccessful Outreach Note  12/05/2020 Name: Elizabeth Bush MRN: [id number] DOB: [birth year]  Referred by: Ladell Pier, MD Reason for referral : High Risk Managed Medicaid (Unsuccessful RNCM follow up outreach)   A second unsuccessful telephone outreach was attempted today. The patient was referred to the case management team for assistance with care management and care coordination.   Follow Up Plan: A HIPAA compliant phone message was left for the patient providing contact information and requesting a return call.   [name], BSN [place]  [employer] manager

## 2020-12-05 NOTE — Patient Outreach (Signed)
Due for meds on 12/12/20. Patient is very non-compliant, will wait 3 weeks to deliver. Will call back next month  Tried to counsel on compliance but patient had response for why she can't take meds daily. Will call back in 3 weeks

## 2020-12-05 NOTE — Patient Instructions (Signed)
Visit Information  [name]  - as a part of your Medicaid benefit, you are eligible for care management and care coordination services at no cost or copay. I was unable to reach you by phone today but would be happy to help you with your health related needs. Please feel free to call me @ [phone].   A member of the Managed Medicaid care management team will reach out to you again over the next 30 days.   [name], BSN [place]  [employer] manager

## 2020-12-08 DIAGNOSIS — S065X9A Traumatic subdural hemorrhage with loss of consciousness of unspecified duration, initial encounter: Secondary | ICD-10-CM | POA: Diagnosis not present

## 2020-12-16 DIAGNOSIS — Z419 Encounter for procedure for purposes other than remedying health state, unspecified: Secondary | ICD-10-CM | POA: Diagnosis not present

## 2020-12-17 DIAGNOSIS — R32 Unspecified urinary incontinence: Secondary | ICD-10-CM | POA: Diagnosis not present

## 2020-12-23 ENCOUNTER — Other Ambulatory Visit: Payer: Self-pay

## 2020-12-25 ENCOUNTER — Telehealth: Payer: Self-pay | Admitting: Internal Medicine

## 2020-12-25 NOTE — Telephone Encounter (Signed)
Copied from [place] [id number]. Topic: General - Other >> Dec 20, 2020  3:33 PM [name], Elizabeth Bush wrote: Reason for CRM: Pt called in and wanted to know if Dr Wynetta Emery would put in an order for Bush back and neck brace. Please advise.

## 2020-12-26 NOTE — Telephone Encounter (Signed)
Patient has an appointment on 8/22

## 2020-12-26 NOTE — Telephone Encounter (Signed)
Please schedule patient an appointment.  

## 2020-12-31 ENCOUNTER — Other Ambulatory Visit: Payer: Self-pay | Admitting: *Deleted

## 2020-12-31 ENCOUNTER — Other Ambulatory Visit: Payer: Self-pay
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
## 2021-01-06 ENCOUNTER — Other Ambulatory Visit: Payer: Self-pay | Admitting: Physician Assistant

## 2021-01-06 ENCOUNTER — Ambulatory Visit: Payer: Medicaid Other | Attending: Internal Medicine | Admitting: Internal Medicine

## 2021-01-06 ENCOUNTER — Other Ambulatory Visit: Payer: Self-pay

## 2021-01-06 DIAGNOSIS — I10 Essential (primary) hypertension: Secondary | ICD-10-CM | POA: Diagnosis not present

## 2021-01-06 DIAGNOSIS — G4701 Insomnia due to medical condition: Secondary | ICD-10-CM | POA: Diagnosis not present

## 2021-01-06 DIAGNOSIS — G8929 Other chronic pain: Secondary | ICD-10-CM

## 2021-01-06 DIAGNOSIS — F1010 Alcohol abuse, uncomplicated: Secondary | ICD-10-CM

## 2021-01-06 DIAGNOSIS — M1711 Unilateral primary osteoarthritis, right knee: Secondary | ICD-10-CM | POA: Diagnosis not present

## 2021-01-06 NOTE — Telephone Encounter (Signed)
Copied from [place] [id number]. Topic: General - Other >> Jan 03, 2021 12:44 PM Valere Dross wrote: Reason for CRM: Pt called in stating she needs PCP to approve for her supplies through [facility] because she is having a surgery coming up and will need those supplies when she discharge appt. Is on 09/02

## 2021-01-06 NOTE — Progress Notes (Signed)
Virtual Visit via Video Note  I connected with Elizabeth Bush on 01/06/21 at  9:10 AM EDT by a video enabled telemedicine application and verified that I am speaking with the correct person using two identifiers.  Location: Patient: home Provider: office   I discussed the limitations of evaluation and management by telemedicine and the availability of in person appointments. The patient expressed understanding and agreed to proceed.  History of Present Illness: Hx of ETOH abuse, HTN, severe hemorrhoids, rectal prolapse, fibroids, iron def anemia, OA knees, MDD, functional urinary and fecal incontinence.  Last seen 08/2020   Patient is still seeing the orthopedics Dr. Ninfa Linden.  She is now needing total knee replacement on the right.  Patient states that the date has been set for September 2 but he has ordered some blood tests on her first. -Told her to stop tramadol.  She is on Tylenol with codeine instead. -Reports chronic and unrelenting pain in the knee.  Worse with standing and when laying down.   -Taking Cymbalta but not every day.  Confirms taking Celebrex. -She is requesting that I sign off on equipment for her to get a smaller wheelchair.  She has a wheelchair but it is too big to fit down her hallway.  She also requests a Rollator walker.  This will allow her to be able to sit to clean dishes and cook.  HTN: Reports compliance with taking amlodipine.  No device to check blood pressure.  She would like to have 1.  Recent blood pressure reading in the system was on June 23 and reading was 126/74.  EtOH use: She continues to drink.  She drinks two 40 oz beer "when I can get it."  She states that she can do without if when she needs to.  She does not consider herself an alcoholic stating that she does not drink hard liquor just beer and she is still functional and pays her bills. Outpatient Encounter Medications as of 01/06/2021  Medication Sig   acetaminophen (TYLENOL) 325 MG tablet Take  1-2 tablets (325-650 mg total) by mouth every 4 (four) hours as needed for mild pain.   acetaminophen-codeine (TYLENOL #3) 300-30 MG tablet Take 1-2 tablets by mouth every 8 (eight) hours as needed.   amLODipine (NORVASC) 10 MG tablet Take 1 tablet (10 mg total) by mouth daily.   amLODipine (NORVASC) 10 MG tablet TAKE 1 TABLET (10 MG TOTAL) BY MOUTH DAILY. (Patient not taking: No sig reported)   celecoxib (CELEBREX) 200 MG capsule Take 1 capsule (200 mg total) by mouth daily. (Patient taking differently: Take 200 mg by mouth 2 (two) times daily.)   DULoxetine (CYMBALTA) 30 MG capsule Take 1 capsule (30 mg total) by mouth every morning. (Patient not taking: Reported on 01/03/2021)   DULoxetine (CYMBALTA) 30 MG capsule TAKE 1 CAPSULE (30 MG TOTAL) BY MOUTH EVERY MORNING.   DULoxetine (CYMBALTA) 30 MG capsule TAKE ONE CAPSULE BY MOUTH EVERY MORNING (Patient not taking: Reported on 01/03/2021)   methocarbamol (ROBAXIN) 500 MG tablet Take 2 tablets (1,000 mg total) by mouth every 8 (eight) hours.   methocarbamol (ROBAXIN) 500 MG tablet TAKE ONE TABLET BY MOUTH EVERY 6 HOURS AS NEEDED FOR MUSCLE SPASMS   methocarbamol (ROBAXIN) 500 MG tablet TAKE 2 TABLETS (1,000 MG TOTAL) BY MOUTH EVERY 8 (EIGHT) HOURS. (Patient not taking: No sig reported)   Misc. Devices MISC Rollator Walker DX M17.0   omeprazole (PRILOSEC) 20 MG capsule Take 1 capsule (20 mg total) by  mouth daily. (Patient taking differently: Take 20 mg by mouth daily as needed (acid reflux).)   polyethylene glycol (MIRALAX / GLYCOLAX) 17 g packet Take 17 g by mouth 2 (two) times daily.   senna-docusate (SENOKOT-S) 8.6-50 MG tablet Take 1 tablet by mouth at bedtime.   [DISCONTINUED] ferrous sulfate 325 (65 FE) MG tablet Take 1 tablet (325 mg total) by mouth daily with breakfast. (Patient not taking: Reported on 04/24/2020)   No facility-administered encounter medications on file as of 01/06/2021.      Observations/Objective: Middle-aged  African-American female in NAD Lab Results  Component Value Date   WBC 5.6 09/17/2020   HGB 10.8 (L) 09/17/2020   HCT 33.0 (L) 09/17/2020   MCV 89.7 09/17/2020   PLT 216 09/17/2020     Chemistry      Component Value Date/Time   NA 134 (L) 07/31/2020 0615   NA 132 (L) 09/15/2019 0909   K 3.5 07/31/2020 0615   CL 102 07/31/2020 0615   CO2 24 07/31/2020 0615   BUN <5 (L) 07/31/2020 0615   BUN 6 09/15/2019 0909   CREATININE 0.73 08/02/2020 0452   CREATININE 0.69 04/01/2020 1210      Component Value Date/Time   CALCIUM 8.8 (L) 07/31/2020 0615   ALKPHOS 47 07/29/2020 0456   AST 33 07/29/2020 0456   AST 145 (H) 04/01/2020 1210   ALT 23 07/29/2020 0456   ALT 55 (H) 04/01/2020 1210   BILITOT 1.1 07/29/2020 0456   BILITOT 0.3 04/01/2020 1210       Assessment and Plan: 1. Essential hypertension Blood pressure reading at goal Prescription will be sent to her pharmacy for blood pressure monitoring device - For home use only DME Other see comment  2. Primary osteoarthritis of right knee Advised patient to continue the Celebrex and Cymbalta.  She is getting Tylenol with codeine from the orthopedics.  Plan is for total knee replacement on the right. -I will signed off on her getting the smaller wheelchair and Rollator walker.  They would be beneficial given her current status.  3. Insomnia secondary to chronic pain See #2 above  4. Alcohol abuse Encouraged her to quit.  Explained to her the diagnosis of alcoholism and how much is too much.  Patient does not feel that she needs to be in any treatment program at this time.  Informed her of the importance of quitting at least a few weeks prior to her knee replacement surgery to prevent DTs   Follow Up Instructions: 4 mths   I discussed the assessment and treatment plan with the patient. The patient was provided an opportunity to ask questions and all were answered. The patient agreed with the plan and demonstrated an understanding  of the instructions.   The patient was advised to call back or seek an in-person evaluation if the symptoms worsen or if the condition fails to improve as anticipated.  I spent 19 minutes dedicated to the care of this patient on the date of this encounter to include previsit review of her chart since last visit with me, face-to-face time with the patient discussing her concerns, diagnosis and management and postvisit entering of order.  Karle Plumber, MD

## 2021-01-07 ENCOUNTER — Telehealth: Payer: Self-pay | Admitting: Internal Medicine

## 2021-01-07 DIAGNOSIS — M1711 Unilateral primary osteoarthritis, right knee: Secondary | ICD-10-CM

## 2021-01-07 NOTE — Telephone Encounter (Signed)
Phone call returned to [place], Transport planner with [facility].  I received a voicemail.  I left a message informing her that I was returning her call and that I received her message about me writing a prescription for recliner for the patient Elizabeth Bush.  I told her I have no problems doing that as long as it will be covered by her insurance and if she can let us know the type of recliner to write for.

## 2021-01-07 NOTE — Telephone Encounter (Signed)
Will forward to provider  

## 2021-01-07 NOTE — Telephone Encounter (Signed)
Caller name: Mal Misty  Relation to pt: Care Manager with [employer]  Call back number: [phone]   Reason for call:    Patient is having right total knee replacement on 01/17/2021. Care management and patient states patient will benefit from a recliner and would like PCP to write a script. Caller would like a follow up call

## 2021-01-08 ENCOUNTER — Other Ambulatory Visit: Payer: Self-pay | Admitting: Orthopaedic Surgery

## 2021-01-08 ENCOUNTER — Telehealth: Payer: Self-pay

## 2021-01-08 DIAGNOSIS — I10 Essential (primary) hypertension: Secondary | ICD-10-CM | POA: Diagnosis not present

## 2021-01-08 DIAGNOSIS — S065X9A Traumatic subdural hemorrhage with loss of consciousness of unspecified duration, initial encounter: Secondary | ICD-10-CM | POA: Diagnosis not present

## 2021-01-08 NOTE — Telephone Encounter (Signed)
Call placed to [place] regarding order for recliner.  She said that she is just passing along a patient's request.  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 She has no idea if it will be covered by insurance. She could not state if any specific documentation is needed from the provider regarding this request, other than the provider should state the reason why the recliner is medically necessary.   When asked where an order should be sent, she was not sure.  She said she contacted [employer] and was told that they have recliners for $700. This CM asked if they accept insurance now, because [employer] has not billed insurance companies and only take private pay. She was not sure and did not have any other suggestions for submitting this order.  Call placed to [facility], spoke to [place], who said they don't work with any insurance, all items are paid out of pocket by patients.

## 2021-01-09 ENCOUNTER — Telehealth: Payer: Self-pay | Admitting: Internal Medicine

## 2021-01-09 NOTE — Patient Instructions (Addendum)
DUE TO COVID-19 ONLY ONE VISITOR IS ALLOWED TO COME WITH YOU AND STAY IN THE WAITING ROOM ONLY DURING PRE OP AND PROCEDURE DAY OF SURGERY.   TWO VISITOR  MAY VISIT WITH YOU AFTER SURGERY IN YOUR PRIVATE ROOM DURING VISITING HOURS ONLY!  YOU NEED TO HAVE A COVID 19 TEST ON___8-31-22____ between 8am-3pm______, THIS TEST MUST BE DONE BEFORE SURGERY,     Please bring completed form with you to the COVID testing site   COVID TESTING SITE [place] TEST IS COMPLETED,  PLEASE Wear a mask when in public           Your procedure is scheduled on: 01-17-21   Report to [facility] Main  Entrance   Report to admitting at       0930 AM     Call this number if you have problems the morning of surgery [phone]   Remember: NO SOLID FOOD AFTER MIDNIGHT THE NIGHT PRIOR TO SURGERY. NOTHING BY MOUTH EXCEPT CLEAR LIQUIDS UNTIL    0900 am .   PLEASE FINISH ENSURE DRINK PER SURGEON ORDER  WHICH NEEDS TO BE COMPLETED AT      0900 am then nothing by mouth.     CLEAR LIQUID DIET                                                                    water Black Coffee and tea, regular and decaf                             Plain Jell-O any favor except red or purple                                  Fruit ices (not with fruit pulp)                                      Iced Popsicles                                     Carbonated beverages, regular and diet                                    Cranberry, grape and apple juices Sports drinks like Gatorade Lightly seasoned clear broth or consume(fat free) Sugar, honey syrup  _____________________________________________________________________     BRUSH YOUR TEETH MORNING OF SURGERY AND RINSE YOUR MOUTH OUT, NO CHEWING GUM CANDY OR MINTS.     Take these medicines the morning of surgery with A SIP OF WATER: omeprazole if needed, amlodipine, tylenol if needed, methocarbamol                                  You may not have any metal on your  body including hair pins and              piercings  Do not wear jewelry, make-up, lotions, powders,perfumes,      deodorant             Do not wear nail polish on your fingernails or toenails .  Do not shave  48 hours prior to surgery.                Do not bring valuables to the hospital. [place].  Contacts, dentures or bridgework may not be worn into surgery.       Patients discharged the day of surgery will not be allowed to drive home. IF YOU ARE HAVING SURGERY AND GOING HOME THE SAME DAY, YOU MUST HAVE AN ADULT TO DRIVE YOU HOME AND BE WITH YOU FOR 24 HOURS. YOU MAY GO HOME BY TAXI OR UBER OR ORTHERWISE, BUT AN ADULT MUST ACCOMPANY YOU HOME AND STAY WITH YOU FOR 24 HOURS.  Name and phone number of your driver:  Special Instructions: N/A              Please read over the following fact sheets you were given: _____________________________________________________________________             [facility] - Preparing for Surgery Before surgery, you can play an important role.  Because skin is not sterile, your skin needs to be as free of germs as possible.  You can reduce the number of germs on your skin by washing with CHG (chlorahexidine gluconate) soap before surgery.  CHG is an antiseptic cleaner which kills germs and bonds with the skin to continue killing germs even after washing. Please DO NOT use if you have an allergy to CHG or antibacterial soaps.  If your skin becomes reddened/irritated stop using the CHG and inform your nurse when you arrive at Short Stay. Do not shave (including legs and underarms) for at least 48 hours prior to the first CHG shower.  You may shave your face/neck. Please follow these instructions carefully:  1.  Shower with CHG Soap the night before surgery and the  morning of Surgery.  2.  If you choose to wash your hair, wash your hair first as usual  with your  normal  shampoo.  3.  After you shampoo, rinse your hair and body thoroughly to remove the  shampoo.                           4.  Use CHG as you would any other liquid soap.  You can apply chg directly  to the skin and wash                       Gently with a scrungie or clean washcloth.  5.  Apply the CHG Soap to your body ONLY FROM THE NECK DOWN.   Do not use on face/ open                           Wound or open sores. Avoid contact with eyes, ears mouth and genitals (private parts).                       Wash face,  Genitals (private parts) with  your normal soap.             6.  Wash thoroughly, paying special attention to the area where your surgery  will be performed.  7.  Thoroughly rinse your body with warm water from the neck down.  8.  DO NOT shower/wash with your normal soap after using and rinsing off  the CHG Soap.                9.  Pat yourself dry with a clean towel.            10.  Wear clean pajamas.            11.  Place clean sheets on your bed the night of your first shower and do not  sleep with pets. Day of Surgery : Do not apply any lotions/deodorants the morning of surgery.  Please wear clean clothes to the hospital/surgery center.  FAILURE TO FOLLOW THESE INSTRUCTIONS MAY RESULT IN THE CANCELLATION OF YOUR SURGERY PATIENT SIGNATURE_________________________________  NURSE SIGNATURE__________________________________  ______  Adam Phenix  An incentive spirometer is a tool that can help keep your lungs clear and active. This tool measures how well you are filling your lungs with each breath. Taking long deep breaths may help reverse or decrease the chance of developing breathing (pulmonary) problems (especially infection) following: A long period of time when you are unable to move or be active. BEFORE THE PROCEDURE  If the spirometer includes an indicator to show your best effort, your nurse or respiratory therapist will set it to a desired  goal. If possible, sit up straight or lean slightly forward. Try not to slouch. Hold the incentive spirometer in an upright position. INSTRUCTIONS FOR USE  Sit on the edge of your bed if possible, or sit up as far as you can in bed or on a chair. Hold the incentive spirometer in an upright position. Breathe out normally. Place the mouthpiece in your mouth and seal your lips tightly around it. Breathe in slowly and as deeply as possible, raising the piston or the ball toward the top of the column. Hold your breath for 3-5 seconds or for as long as possible. Allow the piston or ball to fall to the bottom of the column. Remove the mouthpiece from your mouth and breathe out normally. Rest for a few seconds and repeat Steps 1 through 7 at least 10 times every 1-2 hours when you are awake. Take your time and take a few normal breaths between deep breaths. The spirometer may include an indicator to show your best effort. Use the indicator as a goal to work toward during each repetition. After each set of 10 deep breaths, practice coughing to be sure your lungs are clear. If you have an incision (the cut made at the time of surgery), support your incision when coughing by placing a pillow or rolled up towels firmly against it. Once you are able to get out of bed, walk around indoors and cough well. You may stop using the incentive spirometer when instructed by your caregiver.  RISKS AND COMPLICATIONS Take your time so you do not get dizzy or light-headed. If you are in pain, you may need to take or ask for pain medication before doing incentive spirometry. It is harder to take a deep breath if you are having pain. AFTER USE Rest and breathe slowly and easily. It can be helpful to keep track of a log of your progress. Your caregiver can  provide you with a simple table to help with this. If you are using the spirometer at home, follow these instructions: [place] IF:  You are having difficultly  using the spirometer. You have trouble using the spirometer as often as instructed. Your pain medication is not giving enough relief while using the spirometer. You develop fever of 100.5 F (38.1 C) or higher. SEEK IMMEDIATE MEDICAL CARE IF:  You cough up bloody sputum that had not been present before. You develop fever of 102 F (38.9 C) or greater. You develop worsening pain at or near the incision site. MAKE SURE YOU:  Understand these instructions. Will watch your condition. Will get help right away if you are not doing well or get worse. Document Released: 09/14/2006 Document Revised: 07/27/2011 Document Reviewed: 11/15/2006 [facility] Patient Information 2014 [employer], [place].   ________________________________________________________________________ __________________________________________________________________

## 2021-01-09 NOTE — Telephone Encounter (Signed)
Call placed to patient and informed her of conversation with Delorse Lek regarding the request for a recliner. There is no guarantee that her insurance will pay for a recliner.   The patient confirmed that she is looking for a recliner, not a lift chair. Informed her that this CM is not aware of a company that provides recliners and will bill her insurance. Explained to her that if she is aware of a company who will bill an IT consultant for such an item, to notify this CM who can then contact the company for more information.    Informed her that [employer] ([employer]) has recliners but she would need to pay out of pocket.  She said she could not afford that. Provided her with the phone number for [employer] and explained that she could call to inquire if they offer payment plans. Also explained that she could contact furniture companies and she would have a better chance of a payment plan options. She said she could possibly do a payment plan.

## 2021-01-09 NOTE — Progress Notes (Addendum)
PCP - Karle Plumber, MD LOV 01-06-21 epic Cardiologist - no  PPM/ICD -  Device Orders -  Rep Notified -   Chest x-ray - CT chest 07-24-20 epic EKG - 07-23-20 epic Stress Test -  ECHO -  Cardiac Cath -   Sleep Study -  CPAP -   Fasting Blood Sugar -  Checks Blood Sugar _____ times a day  Blood Thinner Instructions: Aspirin Instructions:  ERAS Protcol - PRE-SURGERY Ensure   COVID TEST- 01-15-21  COVID vaccine -fully vaccinated [place]  Activity--Can do ADL's without SOB  limited due to knee pain Anesthesia review: HTN  Patient denies shortness of breath, fever, cough and chest pain at PAT appointment   All instructions explained to the patient, with a verbal understanding of the material. Patient agrees to go over the instructions while at home for a better understanding. Patient also instructed to self quarantine after being tested for COVID-19. The opportunity to ask questions was provided.

## 2021-01-09 NOTE — Telephone Encounter (Signed)
Patient called in says missed call today a few minutes ago not sure what is was about. Please call back

## 2021-01-09 NOTE — Telephone Encounter (Signed)
Copied from [place] [id number]. Topic: General - Other >> Jan 09, 2021  4:11 PM Leward Quan A wrote: Reason for CRM: Patient called in to inquire of dr Wynetta Emery about the order that was to be sent to [place] [facility] for a blood pressure monitor. Say that it was discussed during virtual visit Please advise

## 2021-01-09 NOTE — Telephone Encounter (Signed)
ok 

## 2021-01-10 ENCOUNTER — Other Ambulatory Visit: Payer: Self-pay | Admitting: Licensed Clinical Social Worker

## 2021-01-10 ENCOUNTER — Encounter (HOSPITAL_COMMUNITY)
Admission: RE | Admit: 2021-01-10 | Discharge: 2021-01-10 | Disposition: A | Discharge status: Home / Self Care | Payer: Medicaid Other | Source: Ambulatory Visit | Attending: Orthopaedic Surgery | Admitting: Orthopaedic Surgery

## 2021-01-10 ENCOUNTER — Encounter (HOSPITAL_COMMUNITY): Payer: Self-pay

## 2021-01-10 ENCOUNTER — Other Ambulatory Visit: Payer: Self-pay

## 2021-01-10 DIAGNOSIS — Z01812 Encounter for preprocedural laboratory examination: Secondary | ICD-10-CM | POA: Diagnosis not present

## 2021-01-10 HISTORY — DX: Unspecified hemorrhoids: K64.9

## 2021-01-10 LAB — BASIC METABOLIC PANEL
Anion gap: 8 (ref 5–15)
BUN: 12 mg/dL (ref 6–20)
CO2: 23 mmol/L (ref 22–32)
Calcium: 9.7 mg/dL (ref 8.9–10.3)
Chloride: 104 mmol/L (ref 98–111)
Creatinine, Ser: 0.56 mg/dL (ref 0.44–1.00)
GFR, Estimated: >60 mL/min (ref >60)
Glucose, Bld: 95 mg/dL (ref 70–99)
Potassium: 4.3 mmol/L (ref 3.5–5.1)
Sodium: 135 mmol/L (ref 135–145)

## 2021-01-10 LAB — CBC
HCT: 33.9 % — ABNORMAL LOW (ref 36.0–46.0)
Hemoglobin: 11.7 g/dL — ABNORMAL LOW (ref 12.0–15.0)
MCH: 31.9 pg (ref 26.0–34.0)
MCHC: 34.5 g/dL (ref 30.0–36.0)
MCV: 92.4 fL (ref 80.0–100.0)
Platelets: 224 10*3/uL (ref 150–400)
RBC: 3.67 MIL/uL — ABNORMAL LOW (ref 3.87–5.11)
RDW: 13.9 % (ref 11.5–15.5)
WBC: 5 10*3/uL (ref 4.0–10.5)
nRBC: 0 % (ref 0.0–0.2)

## 2021-01-10 LAB — SURGICAL PCR SCREEN
MRSA, PCR: NEGATIVE
Staphylococcus aureus: POSITIVE — AB

## 2021-01-10 NOTE — Telephone Encounter (Signed)
Returned pt call and made aware that rx was sent to [place] pt states she forgot I mention this to her. Pt states she doesn't have any questions or concerns.

## 2021-01-10 NOTE — Patient Instructions (Signed)
Visit Information  Elizabeth Bush was given information about Medicaid Managed Care team care coordination services as a part of their [facility] Medicaid benefit. [name] verbally consented to engagement with the [facility] Managed Care team. You desire to start [facility] SW services in 90 days.   If you are experiencing a medical emergency, please call 911 or report to your local emergency department or urgent care.   If you have a non-emergency medical problem during routine business hours, please contact your provider's office and ask to speak with a nurse.   For questions related to your [facility] health plan, please call: [phone] or go here:[email or web address]  If you would like to schedule transportation through your [facility] plan, please call the following number at least 2 days in advance of your appointment: [phone].  Call the [place] at [phone], at any time, 24 hours a day, 7 days a week. If you are in danger or need immediate medical attention call 911.  If you would like help to quit smoking, call 1-800-QUIT-NOW 4690465199) OR Espaol: 1-855-Djelo-Ya HD:1601594) o para ms informacin haga clic aqu or Text READY to 200-400 to register via text  Eula Fried, [place], MSW, [employer] Managed Medicaid LCSW [email or web address] Phone: [phone]
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
## 2021-01-15 ENCOUNTER — Other Ambulatory Visit: Payer: Self-pay | Admitting: Orthopaedic Surgery

## 2021-01-15 LAB — SARS CORONAVIRUS 2 (TAT 6-24 HRS): SARS Coronavirus 2: NEGATIVE

## 2021-01-16 ENCOUNTER — Telehealth: Payer: Self-pay | Admitting: Internal Medicine

## 2021-01-16 DIAGNOSIS — Z419 Encounter for procedure for purposes other than remedying health state, unspecified: Secondary | ICD-10-CM | POA: Diagnosis not present
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
## 2021-01-16 NOTE — Telephone Encounter (Signed)
Copied from [place] [id number]. Topic: General - Other >> Jan 14, 2021  9:51 AM Robina Ade, Helene Kelp D wrote: Reason for CRM: Patient called and wanted to say thank you, thank you, thank you to Dr. Wynetta Emery for helping her get a wheel chair for after her surgery.

## 2021-01-17 ENCOUNTER — Observation Stay (HOSPITAL_COMMUNITY): Payer: Medicaid Other

## 2021-01-17 ENCOUNTER — Encounter (HOSPITAL_COMMUNITY): Payer: Self-pay | Admitting: Orthopaedic Surgery

## 2021-01-17 ENCOUNTER — Encounter (HOSPITAL_COMMUNITY)
Admission: RE | Disposition: A | Discharge status: Home / Self Care | Payer: Self-pay | Source: Home / Self Care | Attending: Orthopaedic Surgery

## 2021-01-17 ENCOUNTER — Observation Stay (HOSPITAL_COMMUNITY)
Admission: RE | Admit: 2021-01-17 | Discharge: 2021-01-19 | Disposition: A | Discharge status: Home / Self Care | Payer: Medicaid Other | Attending: Orthopaedic Surgery | Admitting: Orthopaedic Surgery

## 2021-01-17 ENCOUNTER — Ambulatory Visit (HOSPITAL_COMMUNITY): Payer: Medicaid Other | Admitting: Anesthesiology

## 2021-01-17 ENCOUNTER — Other Ambulatory Visit: Payer: Self-pay

## 2021-01-17 DIAGNOSIS — F17210 Nicotine dependence, cigarettes, uncomplicated: Secondary | ICD-10-CM | POA: Diagnosis not present

## 2021-01-17 DIAGNOSIS — Z79899 Other long term (current) drug therapy: Secondary | ICD-10-CM | POA: Insufficient documentation

## 2021-01-17 DIAGNOSIS — K219 Gastro-esophageal reflux disease without esophagitis: Secondary | ICD-10-CM | POA: Diagnosis not present

## 2021-01-17 DIAGNOSIS — I10 Essential (primary) hypertension: Secondary | ICD-10-CM | POA: Diagnosis not present

## 2021-01-17 DIAGNOSIS — D62 Acute posthemorrhagic anemia: Secondary | ICD-10-CM | POA: Diagnosis not present

## 2021-01-17 DIAGNOSIS — Z96652 Presence of left artificial knee joint: Secondary | ICD-10-CM | POA: Diagnosis not present

## 2021-01-17 DIAGNOSIS — M1711 Unilateral primary osteoarthritis, right knee: Secondary | ICD-10-CM | POA: Diagnosis not present

## 2021-01-17 DIAGNOSIS — Z96651 Presence of right artificial knee joint: Secondary | ICD-10-CM

## 2021-01-17 DIAGNOSIS — M170 Bilateral primary osteoarthritis of knee: Secondary | ICD-10-CM | POA: Diagnosis not present

## 2021-01-17 DIAGNOSIS — G8918 Other acute postprocedural pain: Secondary | ICD-10-CM | POA: Diagnosis not present

## 2021-01-17 HISTORY — PX: TOTAL KNEE ARTHROPLASTY: SHX125

## 2021-01-17 LAB — TYPE AND SCREEN
ABO/RH(D): B POS
Antibody Screen: NEGATIVE

## 2021-01-17 SURGERY — ARTHROPLASTY, KNEE, TOTAL
Anesthesia: Spinal | Site: Knee | Laterality: Right

## 2021-01-17 MED ORDER — CLONIDINE HCL (ANALGESIA) 100 MCG/ML EP SOLN
EPIDURAL | Status: DC | PRN
  Administered 2021-01-17: 50 ug

## 2021-01-17 MED ORDER — PROMETHAZINE HCL 25 MG/ML IJ SOLN: 6.2500 mg | INTRAMUSCULAR | Status: DC | PRN

## 2021-01-17 MED ORDER — AMLODIPINE BESYLATE 10 MG PO TABS
10.0000 mg | ORAL_TABLET | Freq: Every day | ORAL | Status: DC
  Administered 2021-01-18 – 2021-01-19 (×2): 10 mg via ORAL
  Filled 2021-01-17 (×3): qty 1

## 2021-01-17 MED ORDER — MIDAZOLAM HCL 2 MG/2ML IJ SOLN
1.0000 mg | INTRAMUSCULAR | Status: DC
  Administered 2021-01-17: 2 mg via INTRAVENOUS
  Filled 2021-01-17: qty 2

## 2021-01-17 MED ORDER — CHLORHEXIDINE GLUCONATE 0.12 % MT SOLN
15.0000 mL | Freq: Once | OROMUCOSAL | Status: AC
  Administered 2021-01-17: 15 mL via OROMUCOSAL

## 2021-01-17 MED ORDER — ACETAMINOPHEN 325 MG PO TABS
325.0000 mg | ORAL_TABLET | Freq: Four times a day (QID) | ORAL | Status: DC | PRN
  Administered 2021-01-17 – 2021-01-19 (×2): 650 mg via ORAL
  Filled 2021-01-17 (×2): qty 2

## 2021-01-17 MED ORDER — ACETAMINOPHEN 500 MG PO TABS
1000.0000 mg | ORAL_TABLET | Freq: Once | ORAL | Status: AC
  Administered 2021-01-17: 1000 mg via ORAL
  Filled 2021-01-17: qty 2

## 2021-01-17 MED ORDER — FENTANYL CITRATE (PF) 100 MCG/2ML IJ SOLN
INTRAMUSCULAR | Status: DC | PRN
  Administered 2021-01-17: 100 ug via INTRAVENOUS

## 2021-01-17 MED ORDER — CEFAZOLIN SODIUM-DEXTROSE 2-4 GM/100ML-% IV SOLN
2.0000 g | INTRAVENOUS | Status: AC
  Administered 2021-01-17: 2 g via INTRAVENOUS
  Filled 2021-01-17: qty 100

## 2021-01-17 MED ORDER — ONDANSETRON HCL 4 MG/2ML IJ SOLN
INTRAMUSCULAR | Status: DC | PRN
  Administered 2021-01-17: 4 mg via INTRAVENOUS

## 2021-01-17 MED ORDER — STERILE WATER FOR IRRIGATION IR SOLN
Status: DC | PRN
  Administered 2021-01-17: 2000 mL

## 2021-01-17 MED ORDER — OXYCODONE HCL 5 MG PO TABS
10.0000 mg | ORAL_TABLET | ORAL | Status: DC | PRN
  Administered 2021-01-17: 10 mg via ORAL
  Administered 2021-01-18 (×2): 15 mg via ORAL
  Administered 2021-01-18: 10 mg via ORAL
  Filled 2021-01-17: qty 3
  Filled 2021-01-17: qty 2
  Filled 2021-01-17: qty 3
  Filled 2021-01-17: qty 2
  Filled 2021-01-17: qty 3

## 2021-01-17 MED ORDER — TRANEXAMIC ACID-NACL 1000-0.7 MG/100ML-% IV SOLN
1000.0000 mg | INTRAVENOUS | Status: AC
  Administered 2021-01-17: 1000 mg via INTRAVENOUS
  Filled 2021-01-17: qty 100

## 2021-01-17 MED ORDER — MORPHINE SULFATE (PF) 2 MG/ML IV SOLN
2.0000 mg | INTRAVENOUS | Status: DC | PRN
  Administered 2021-01-18: 2 mg via INTRAVENOUS
  Filled 2021-01-17: qty 1

## 2021-01-17 MED ORDER — BUPIVACAINE-EPINEPHRINE (PF) 0.25% -1:200000 IJ SOLN
INTRAMUSCULAR | Status: AC
  Filled 2021-01-17: qty 30

## 2021-01-17 MED ORDER — ONDANSETRON HCL 4 MG PO TABS: 4.0000 mg | ORAL_TABLET | Freq: Four times a day (QID) | ORAL | Status: DC | PRN

## 2021-01-17 MED ORDER — METHOCARBAMOL 500 MG IVPB - SIMPLE MED
500.0000 mg | Freq: Four times a day (QID) | INTRAVENOUS | Status: DC | PRN
  Filled 2021-01-17: qty 50

## 2021-01-17 MED ORDER — POVIDONE-IODINE 10 % EX SWAB
2.0000 "application " | Freq: Once | CUTANEOUS | Status: AC
  Administered 2021-01-17: 2 via TOPICAL

## 2021-01-17 MED ORDER — ROPIVACAINE HCL 5 MG/ML IJ SOLN
INTRAMUSCULAR | Status: DC | PRN
  Administered 2021-01-17: 30 mL via PERINEURAL

## 2021-01-17 MED ORDER — BUPIVACAINE IN DEXTROSE 0.75-8.25 % IT SOLN
INTRATHECAL | Status: DC | PRN
  Administered 2021-01-17: 1.6 mL via INTRATHECAL

## 2021-01-17 MED ORDER — ONDANSETRON HCL 4 MG/2ML IJ SOLN: 4.0000 mg | Freq: Four times a day (QID) | INTRAMUSCULAR | Status: DC | PRN

## 2021-01-17 MED ORDER — LACTATED RINGERS IV SOLN: INTRAVENOUS | Status: DC

## 2021-01-17 MED ORDER — BUPIVACAINE-EPINEPHRINE 0.25% -1:200000 IJ SOLN
INTRAMUSCULAR | Status: DC | PRN
  Administered 2021-01-17: 30 mL

## 2021-01-17 MED ORDER — FENTANYL CITRATE PF 50 MCG/ML IJ SOSY
100.0000 ug | PREFILLED_SYRINGE | Freq: Once | INTRAMUSCULAR | Status: AC
  Administered 2021-01-17: 50 ug via INTRAVENOUS
  Filled 2021-01-17: qty 2

## 2021-01-17 MED ORDER — FENTANYL CITRATE (PF) 100 MCG/2ML IJ SOLN
INTRAMUSCULAR | Status: AC
  Filled 2021-01-17: qty 2

## 2021-01-17 MED ORDER — ASPIRIN 81 MG PO CHEW
81.0000 mg | CHEWABLE_TABLET | Freq: Two times a day (BID) | ORAL | Status: DC
  Administered 2021-01-17 – 2021-01-19 (×4): 81 mg via ORAL
  Filled 2021-01-17 (×4): qty 1

## 2021-01-17 MED ORDER — PANTOPRAZOLE SODIUM 40 MG PO TBEC
40.0000 mg | DELAYED_RELEASE_TABLET | Freq: Every day | ORAL | Status: DC
  Administered 2021-01-17 – 2021-01-19 (×3): 40 mg via ORAL
  Filled 2021-01-17 (×3): qty 1

## 2021-01-17 MED ORDER — MENTHOL 3 MG MT LOZG: 1.0000 | LOZENGE | OROMUCOSAL | Status: DC | PRN

## 2021-01-17 MED ORDER — FENTANYL CITRATE PF 50 MCG/ML IJ SOSY: 25.0000 ug | PREFILLED_SYRINGE | INTRAMUSCULAR | Status: DC | PRN

## 2021-01-17 MED ORDER — METOCLOPRAMIDE HCL 5 MG/ML IJ SOLN
5.0000 mg | Freq: Three times a day (TID) | INTRAMUSCULAR | Status: DC | PRN
  Administered 2021-01-18: 5 mg via INTRAVENOUS
  Filled 2021-01-17: qty 2

## 2021-01-17 MED ORDER — PROPOFOL 500 MG/50ML IV EMUL
INTRAVENOUS | Status: DC | PRN
  Administered 2021-01-17: 30 mg via INTRAVENOUS

## 2021-01-17 MED ORDER — OXYCODONE HCL 5 MG PO TABS
5.0000 mg | ORAL_TABLET | ORAL | Status: DC | PRN
  Administered 2021-01-17 – 2021-01-18 (×2): 5 mg via ORAL
  Administered 2021-01-18: 10 mg via ORAL
  Filled 2021-01-17 (×2): qty 2
  Filled 2021-01-17: qty 1

## 2021-01-17 MED ORDER — DOCUSATE SODIUM 100 MG PO CAPS
100.0000 mg | ORAL_CAPSULE | Freq: Two times a day (BID) | ORAL | Status: DC
  Administered 2021-01-17 – 2021-01-19 (×4): 100 mg via ORAL
  Filled 2021-01-17 (×4): qty 1

## 2021-01-17 MED ORDER — DEXAMETHASONE SODIUM PHOSPHATE 4 MG/ML IJ SOLN
INTRAMUSCULAR | Status: DC | PRN
  Administered 2021-01-17: 4 mg via INTRAVENOUS

## 2021-01-17 MED ORDER — POLYETHYLENE GLYCOL 3350 17 G PO PACK: 17.0000 g | PACK | Freq: Every day | ORAL | Status: DC | PRN

## 2021-01-17 MED ORDER — PHENOL 1.4 % MT LIQD: 1.0000 | OROMUCOSAL | Status: DC | PRN

## 2021-01-17 MED ORDER — 0.9 % SODIUM CHLORIDE (POUR BTL) OPTIME
TOPICAL | Status: DC | PRN
  Administered 2021-01-17: 1000 mL

## 2021-01-17 MED ORDER — PROPOFOL 500 MG/50ML IV EMUL
INTRAVENOUS | Status: DC | PRN
  Administered 2021-01-17: 75 ug/kg/min via INTRAVENOUS

## 2021-01-17 MED ORDER — METOCLOPRAMIDE HCL 5 MG PO TABS: 5.0000 mg | ORAL_TABLET | Freq: Three times a day (TID) | ORAL | Status: DC | PRN

## 2021-01-17 MED ORDER — SODIUM CHLORIDE 0.9 % IV SOLN: INTRAVENOUS | Status: DC

## 2021-01-17 MED ORDER — SODIUM CHLORIDE 0.9 % IR SOLN
Status: DC | PRN
  Administered 2021-01-17: 1000 mL

## 2021-01-17 MED ORDER — DIPHENHYDRAMINE HCL 12.5 MG/5ML PO ELIX: 12.5000 mg | ORAL_SOLUTION | ORAL | Status: DC | PRN

## 2021-01-17 MED ORDER — CEFAZOLIN SODIUM-DEXTROSE 1-4 GM/50ML-% IV SOLN
1.0000 g | Freq: Four times a day (QID) | INTRAVENOUS | Status: AC
  Administered 2021-01-17 – 2021-01-18 (×2): 1 g via INTRAVENOUS
  Filled 2021-01-17 (×2): qty 50

## 2021-01-17 MED ORDER — METHOCARBAMOL 500 MG PO TABS
500.0000 mg | ORAL_TABLET | Freq: Four times a day (QID) | ORAL | Status: DC | PRN
  Administered 2021-01-17 – 2021-01-19 (×4): 500 mg via ORAL
  Filled 2021-01-17 (×5): qty 1

## 2021-01-17 SURGICAL SUPPLY — 62 items
APL SKNCLS STERI-STRIP NONHPOA (GAUZE/BANDAGES/DRESSINGS)
BAG COUNTER SPONGE SURGICOUNT (BAG) IMPLANT
BAG SPEC THK2 15X12 ZIP CLS (MISCELLANEOUS) ×1
BAG SPNG CNTER NS LX DISP (BAG)
BAG SURGICOUNT SPONGE COUNTING (BAG)
BAG ZIPLOCK 12X15 (MISCELLANEOUS) ×3 IMPLANT
BASEPLATE TIBIAL UNV SZ3 (Orthopedic Implant) ×2 IMPLANT
BENZOIN TINCTURE PRP APPL 2/3 (GAUZE/BANDAGES/DRESSINGS) IMPLANT
BLADE SAG 18X100X1.27 (BLADE) ×3 IMPLANT
BLADE SURG SZ10 CARB STEEL (BLADE) ×6 IMPLANT
BNDG ELASTIC 6X5.8 VLCR STR LF (GAUZE/BANDAGES/DRESSINGS) ×6 IMPLANT
BOWL SMART MIX CTS (DISPOSABLE) ×2 IMPLANT
BSPLAT TIB 3 UNV CMNT TL STAB (Orthopedic Implant) ×1 IMPLANT
CEMENT BONE SIMPLEX SPEEDSET (Cement) ×4 IMPLANT
CLOSURE WOUND 1/2 X4 (GAUZE/BANDAGES/DRESSINGS)
COOLER ICEMAN CLASSIC (MISCELLANEOUS) ×3 IMPLANT
COVER SURGICAL LIGHT HANDLE (MISCELLANEOUS) ×3 IMPLANT
CUFF TOURN SGL QUICK 34 (TOURNIQUET CUFF) ×3
CUFF TRNQT CYL 34X4.125X (TOURNIQUET CUFF) ×1 IMPLANT
DECANTER SPIKE VIAL GLASS SM (MISCELLANEOUS) IMPLANT
DRAPE U-SHAPE 47X51 STRL (DRAPES) ×3 IMPLANT
DRSG PAD ABDOMINAL 8X10 ST (GAUZE/BANDAGES/DRESSINGS) ×6 IMPLANT
DURAPREP 26ML APPLICATOR (WOUND CARE) ×3 IMPLANT
ELECT BLADE TIP CTD 4 INCH (ELECTRODE) ×3 IMPLANT
ELECT REM PT RETURN 15FT ADLT (MISCELLANEOUS) ×3 IMPLANT
FEMORAL PEG DISTAL FIXATION (Orthopedic Implant) ×2 IMPLANT
FEMORAL TRIATH POST STAB SZ 4 (Orthopedic Implant) ×2 IMPLANT
GAUZE SPONGE 4X4 12PLY STRL (GAUZE/BANDAGES/DRESSINGS) ×3 IMPLANT
GAUZE XEROFORM 1X8 LF (GAUZE/BANDAGES/DRESSINGS) ×2 IMPLANT
GLOVE SRG 8 PF TXTR STRL LF DI (GLOVE) ×2 IMPLANT
GLOVE SURG ENC MOIS LTX SZ7.5 (GLOVE) ×3 IMPLANT
GLOVE SURG NEOPR MICRO LF SZ8 (GLOVE) ×3 IMPLANT
GLOVE SURG UNDER POLY LF SZ8 (GLOVE) ×6
GOWN STRL REUS W/TWL XL LVL3 (GOWN DISPOSABLE) ×6 IMPLANT
HANDPIECE INTERPULSE COAX TIP (DISPOSABLE) ×3
HOLDER FOLEY CATH W/STRAP (MISCELLANEOUS) IMPLANT
IMMOBILIZER KNEE 20 (SOFTGOODS) ×3 IMPLANT
IMMOBILIZER KNEE 20 THIGH 36 (SOFTGOODS) ×1 IMPLANT
INSERT TIBIAL STABIL SZ3 13M (Knees) ×2 IMPLANT
KIT TURNOVER KIT A (KITS) ×3 IMPLANT
NS IRRIG 1000ML POUR BTL (IV SOLUTION) ×3 IMPLANT
PACK TOTAL KNEE CUSTOM (KITS) ×3 IMPLANT
PAD COLD SHLDR WRAP-ON (PAD) ×3 IMPLANT
PADDING CAST ABS 6INX4YD NS (CAST SUPPLIES) ×4
PADDING CAST ABS COTTON 6X4 NS (CAST SUPPLIES) IMPLANT
PADDING CAST COTTON 6X4 STRL (CAST SUPPLIES) ×6 IMPLANT
PATELLA TRIATHLON SZ 29 9 MM (Orthopedic Implant) ×2 IMPLANT
PENCIL SMOKE EVACUATOR (MISCELLANEOUS) ×2 IMPLANT
PIN FLUTED HEDLESS FIX 3.5X1/8 (PIN) ×2 IMPLANT
PROTECTOR NERVE ULNAR (MISCELLANEOUS) ×3 IMPLANT
SET HNDPC FAN SPRY TIP SCT (DISPOSABLE) ×1 IMPLANT
SET PAD KNEE POSITIONER (MISCELLANEOUS) ×3 IMPLANT
STAPLER VISISTAT 35W (STAPLE) IMPLANT
STRIP CLOSURE SKIN 1/2X4 (GAUZE/BANDAGES/DRESSINGS) IMPLANT
SUT MNCRL AB 4-0 PS2 18 (SUTURE) IMPLANT
SUT VIC AB 0 CT1 27 (SUTURE) ×3
SUT VIC AB 0 CT1 27XBRD ANTBC (SUTURE) ×1 IMPLANT
SUT VIC AB 1 CT1 36 (SUTURE) ×6 IMPLANT
SUT VIC AB 2-0 CT1 27 (SUTURE) ×6
SUT VIC AB 2-0 CT1 TAPERPNT 27 (SUTURE) ×2 IMPLANT
TRAY FOLEY MTR SLVR 16FR STAT (SET/KITS/TRAYS/PACK) IMPLANT
WATER STERILE IRR 1000ML POUR (IV SOLUTION) ×6 IMPLANT

## 2021-01-17 NOTE — Brief Op Note (Signed)
01/17/2021  3:21 PM  PATIENT:  Elizabeth Bush  50 y.o. female  PRE-OPERATIVE DIAGNOSIS:  osteoarthritis right knee  POST-OPERATIVE DIAGNOSIS:  osteoarthritis right knee  PROCEDURE:  Procedure(s) with comments: RIGHT TOTAL KNEE ARTHROPLASTY (Right) - Needs RNFA  SURGEON:  Surgeon(s) and Role:    * [name], MD - Primary  ASSISTANTS: Arrie Aran, RNFA   ANESTHESIA:   local, regional, and spinal  EBL:  25 mL   COUNTS:  YES  TOURNIQUET:   Total Tourniquet Time Documented: Thigh (Right) - 64 minutes Total: Thigh (Right) - 64 minutes   DICTATION: .Other Dictation: Dictation Number [id number]  PLAN OF CARE: Admit for overnight observation  PATIENT DISPOSITION:  PACU - hemodynamically stable.   Delay start of Pharmacological VTE agent (>24hrs) due to surgical blood loss or risk of bleeding: no

## 2021-01-17 NOTE — Transfer of Care (Signed)
Immediate Anesthesia Transfer of Care Note  Patient: [place] M Spikes  Procedure(s) Performed: RIGHT TOTAL KNEE ARTHROPLASTY (Right: Knee)  Patient Location: PACU  Anesthesia Type:Spinal  Level of Consciousness: awake, alert  and oriented  Airway & Oxygen Therapy: Patient Spontanous Breathing and Patient connected to face mask oxygen  Post-op Assessment: Report given to RN and Post -op Vital signs reviewed and stable  Post vital signs: Reviewed and stable  Last Vitals:  Vitals Value Taken Time  BP 141/80 01/17/21 1542  Temp    Pulse 78 01/17/21 1544  Resp 16 01/17/21 1544  SpO2 100 % 01/17/21 1544  Vitals shown include unvalidated device data.  Last Pain:  Vitals:   01/17/21 1054  TempSrc: Oral         Complications: No notable events documented.

## 2021-01-17 NOTE — Plan of Care (Signed)
  Problem: Nutrition: Goal: Adequate nutrition will be maintained Outcome: Progressing   Problem: Pain Managment: Goal: General experience of comfort will improve Outcome: Progressing   

## 2021-01-17 NOTE — Interval H&P Note (Signed)
History and Physical Interval Note:   The patient is here today for a right total knee replacement to treat her severe right knee arthritis.  There has been no acute change in medical status.  See recent H&P.  The risks and benefits of surgery been explained in detail and informed consent is obtained.  The right knee has been marked.   01/17/2021 12:03 PM  Elizabeth Bush  has presented today for surgery, with the diagnosis of osteoarthritis right knee.  The various methods of treatment have been discussed with the patient and family. After consideration of risks, benefits and other options for treatment, the patient has consented to  Procedure(s) with comments: RIGHT TOTAL KNEE ARTHROPLASTY (Right) - Needs RNFA as a surgical intervention.  The patient's history has been reviewed, patient examined, no change in status, stable for surgery.  I have reviewed the patient's chart and labs.  Questions were answered to the patient's satisfaction.     [name]

## 2021-01-17 NOTE — Anesthesia Postprocedure Evaluation (Signed)
Anesthesia Post Note  Patient: Elizabeth Bush  Procedure(s) Performed: RIGHT TOTAL KNEE ARTHROPLASTY (Right: Knee)     Patient location during evaluation: PACU Anesthesia Type: Spinal Level of consciousness: oriented and awake and alert Pain management: pain level controlled Vital Signs Assessment: post-procedure vital signs reviewed and stable Respiratory status: spontaneous breathing, respiratory function stable and nonlabored ventilation Cardiovascular status: blood pressure returned to baseline and stable Postop Assessment: no headache, no backache, no apparent nausea or vomiting and spinal receding Anesthetic complications: no   No notable events documented.  Last Vitals:  Vitals:   01/17/21 1615 01/17/21 1630  BP: 126/82 (!) 139/101  Pulse: 76 77  Resp: 16 16  Temp:    SpO2: 98% 99%    Last Pain:  Vitals:   01/17/21 1054  TempSrc: Oral                 Lidia Collum

## 2021-01-17 NOTE — Anesthesia Procedure Notes (Signed)
Spinal  Patient location during procedure: OR Start time: 01/17/2021 1:29 PM End time: 01/17/2021 1:33 PM Reason for block: surgical anesthesia Staffing Performed: anesthesiologist  Anesthesiologist: Catalina Gravel, MD Preanesthetic Checklist Completed: patient identified, IV checked, risks and benefits discussed, surgical consent, monitors and equipment checked, pre-op evaluation and timeout performed Spinal Block Patient position: sitting Prep: DuraPrep and site prepped and draped Patient monitoring: continuous pulse ox and blood pressure Approach: midline Location: L3-4 Injection technique: single-shot Needle Needle type: Pencan  Needle gauge: 24 G Assessment Events: CSF return Additional Notes Functioning IV was confirmed and monitors were applied. Sterile prep and drape, including hand hygiene, mask and sterile gloves were used. The patient was positioned and the spine was prepped. The skin was anesthetized with lidocaine.  Free flow of clear CSF was obtained prior to injecting local anesthetic into the CSF.  The spinal needle aspirated freely following injection.  The needle was carefully withdrawn.  The patient tolerated the procedure well. Consent was obtained prior to procedure with all questions answered and concerns addressed. Risks including but not limited to bleeding, infection, nerve damage, paralysis, failed block, inadequate analgesia, allergic reaction, high spinal, itching and headache were discussed and the patient wished to proceed.   Hoy Morn, MD

## 2021-01-17 NOTE — Progress Notes (Signed)
Assisted Dr. Turk with right, ultrasound guided, adductor canal block. Side rails up, monitors on throughout procedure. See vital signs in flow sheet. Tolerated Procedure well.  

## 2021-01-17 NOTE — Anesthesia Preprocedure Evaluation (Signed)
Anesthesia Evaluation    Airway Mallampati: II  TM Distance: >3 FB Neck ROM: Full    Dental  (+) Dental Advisory Given, Missing   Pulmonary Current Smoker and Patient abstained from smoking.,    Pulmonary exam normal breath sounds clear to auscultation       Cardiovascular hypertension, Pt. on medications Normal cardiovascular exam Rhythm:Regular Rate:Normal     Neuro/Psych PSYCHIATRIC DISORDERS Anxiety Depression    GI/Hepatic GERD  Medicated and Controlled,  Endo/Other    Renal/GU      Musculoskeletal  (+) Arthritis ,   Abdominal   Peds  Hematology  (+) Blood dyscrasia, anemia , Plt 224k   Anesthesia Other Findings   Reproductive/Obstetrics                             Anesthesia Physical Anesthesia Plan  ASA: 2  Anesthesia Plan: Spinal   Post-op Pain Management:  Regional for Post-op pain   Induction: Intravenous  PONV Risk Score and Plan: 1 and Propofol infusion, Midazolam, Dexamethasone and Ondansetron  Airway Management Planned: Nasal Cannula and Natural Airway  Additional Equipment:   Intra-op Plan:   Post-operative Plan:   Informed Consent: I have reviewed the patients History and Physical, chart, labs and discussed the procedure including the risks, benefits and alternatives for the proposed anesthesia with the patient or authorized representative who has indicated his/her understanding and acceptance.     Dental advisory given  Plan Discussed with: CRNA, Anesthesiologist and Surgeon  Anesthesia Plan Comments:         Anesthesia Quick Evaluation

## 2021-01-17 NOTE — Anesthesia Procedure Notes (Signed)
Anesthesia Regional Block: Adductor canal block   Pre-Anesthetic Checklist: , timeout performed,  Correct Patient, Correct Site, Correct Laterality,  Correct Procedure, Correct Position, site marked,  Risks and benefits discussed,  Surgical consent,  Pre-op evaluation,  At surgeon's request and post-op pain management  Laterality: Right  Prep: chloraprep       Needles:  Injection technique: Single-shot  Needle Type: Echogenic Needle     Needle Length: 9cm  Needle Gauge: 21     Additional Needles:   Procedures:,,,, ultrasound used (permanent image in chart),,    Narrative:  Start time: 01/17/2021 12:40 PM End time: 01/17/2021 12:47 PM Injection made incrementally with aspirations every 5 mL.  Performed by: Personally  Anesthesiologist: Catalina Gravel, MD  Additional Notes: No pain on injection. No increased resistance to injection. Injection made in 5cc increments.  Good needle visualization.  Patient tolerated procedure well.

## 2021-01-18 DIAGNOSIS — Z96652 Presence of left artificial knee joint: Secondary | ICD-10-CM | POA: Diagnosis not present

## 2021-01-18 DIAGNOSIS — M1711 Unilateral primary osteoarthritis, right knee: Secondary | ICD-10-CM | POA: Diagnosis not present

## 2021-01-18 DIAGNOSIS — Z79899 Other long term (current) drug therapy: Secondary | ICD-10-CM | POA: Diagnosis not present

## 2021-01-18 DIAGNOSIS — I10 Essential (primary) hypertension: Secondary | ICD-10-CM | POA: Diagnosis not present

## 2021-01-18 DIAGNOSIS — F17210 Nicotine dependence, cigarettes, uncomplicated: Secondary | ICD-10-CM | POA: Diagnosis not present

## 2021-01-18 LAB — BASIC METABOLIC PANEL
Anion gap: 7 (ref 5–15)
BUN: 12 mg/dL (ref 6–20)
CO2: 25 mmol/L (ref 22–32)
Calcium: 8.9 mg/dL (ref 8.9–10.3)
Chloride: 98 mmol/L (ref 98–111)
Creatinine, Ser: 0.58 mg/dL (ref 0.44–1.00)
GFR, Estimated: >60 mL/min (ref >60)
Glucose, Bld: 179 mg/dL — ABNORMAL HIGH (ref 70–99)
Potassium: 4 mmol/L (ref 3.5–5.1)
Sodium: 130 mmol/L — ABNORMAL LOW (ref 135–145)

## 2021-01-18 LAB — CBC
HCT: 30.5 % — ABNORMAL LOW (ref 36.0–46.0)
Hemoglobin: 10.1 g/dL — ABNORMAL LOW (ref 12.0–15.0)
MCH: 30.6 pg (ref 26.0–34.0)
MCHC: 33.1 g/dL (ref 30.0–36.0)
MCV: 92.4 fL (ref 80.0–100.0)
Platelets: 212 10*3/uL (ref 150–400)
RBC: 3.3 MIL/uL — ABNORMAL LOW (ref 3.87–5.11)
RDW: 13.1 % (ref 11.5–15.5)
WBC: 7.6 10*3/uL (ref 4.0–10.5)
nRBC: 0 % (ref 0.0–0.2)

## 2021-01-18 MED ORDER — GABAPENTIN 100 MG PO CAPS
100.0000 mg | ORAL_CAPSULE | Freq: Three times a day (TID) | ORAL | Status: DC
  Administered 2021-01-18 (×2): 100 mg via ORAL
  Filled 2021-01-18: qty 1

## 2021-01-18 MED ORDER — OXYCODONE HCL ER 10 MG PO T12A
10.0000 mg | EXTENDED_RELEASE_TABLET | Freq: Two times a day (BID) | ORAL | Status: DC
  Administered 2021-01-18 – 2021-01-19 (×2): 10 mg via ORAL
  Filled 2021-01-18 (×2): qty 1

## 2021-01-18 MED ORDER — METHOCARBAMOL 500 MG PO TABS: 500.0000 mg | ORAL_TABLET | Freq: Four times a day (QID) | ORAL | 1 refills | Status: DC | PRN

## 2021-01-18 MED ORDER — ASPIRIN 81 MG PO CHEW: 81.0000 mg | CHEWABLE_TABLET | Freq: Two times a day (BID) | ORAL | 0 refills | Status: DC

## 2021-01-18 MED ORDER — OXYCODONE HCL 5 MG PO TABS: 5.0000 mg | ORAL_TABLET | ORAL | 0 refills | Status: DC | PRN

## 2021-01-18 NOTE — Progress Notes (Signed)
Subjective: 1 Day Post-Op Procedure(s) (LRB): RIGHT TOTAL KNEE ARTHROPLASTY (Right) Patient reports pain as moderate.    Objective: Vital signs in last 24 hours: Temp:  [97.5 F (36.4 C)-97.9 F (36.6 C)] 97.7 F (36.5 C) (09/03 0628) Pulse Rate:  [76-100] 94 (09/03 1024) Resp:  [10-17] 17 (09/03 1024) BP: (126-182)/(77-101) 132/85 (09/03 1024) SpO2:  [96 %-100 %] 100 % (09/03 1024)  Intake/Output from previous day: 09/02 0701 - 09/03 0700 In: 3090.8 [P.O.:1200; I.V.:1790.8; IV Piggyback:100] Out: 1825 [Urine:1800; Blood:25] Intake/Output this shift: Total I/O In: 660 [P.O.:360; I.V.:300] Out: -   Recent Labs    01/18/21 0247  HGB 10.1*   Recent Labs    01/18/21 0247  WBC 7.6  RBC 3.30*  HCT 30.5*  PLT 212   Recent Labs    01/18/21 0247  NA 130*  K 4.0  CL 98  CO2 25  BUN 12  CREATININE 0.58  GLUCOSE 179*  CALCIUM 8.9   No results for input(s): LABPT, INR in the last 72 hours.  Sensation intact distally Intact pulses distally Dorsiflexion/Plantar flexion intact Incision: dressing C/D/I Compartment soft   Assessment/Plan: 1 Day Post-Op Procedure(s) (LRB): RIGHT TOTAL KNEE ARTHROPLASTY (Right) Up with therapy Discharge to home this afternoon.     [name] 01/18/2021, 12:07 PM

## 2021-01-18 NOTE — Discharge Instructions (Signed)

## 2021-01-18 NOTE — Progress Notes (Signed)
Pt somewhat confused, bur, knew her name, date, not the hospital name but yes to why she is here.

## 2021-01-18 NOTE — Evaluation (Signed)
Physical Therapy Evaluation Patient Details Name: Elizabeth Bush MRN: [id number] DOB: 1971-02-13 Today's Date: 01/18/2021   History of Present Illness  Pt s/p R TKR and with hx of L TKR last year  Clinical Impression  Pt s/p R TKR and presents with decreased R LE strength/ROM and post op pain limiting functional mobility.  Pt should progress to dc home with assist of family/friends.    Follow Up Recommendations Home health PT    Equipment Recommendations  None recommended by PT    Recommendations for Other Services       Precautions / Restrictions Precautions Precautions: Fall Restrictions Weight Bearing Restrictions: No RLE Weight Bearing: Weight bearing as tolerated      Mobility  Bed Mobility Overal bed mobility: Needs Assistance Bed Mobility: Supine to Sit     Supine to sit: Min assist     General bed mobility comments: Increased time with cues for sequence; assist for R LE    Transfers Overall transfer level: Needs assistance Equipment used: Rolling walker (2 wheeled) Transfers: Sit to/from Stand Sit to Stand: Mod assist;From elevated surface         General transfer comment: cues for LE management and use of UEs to self assist.  Physical assist to bring wt up and fwd and to balance in standing with RW  Ambulation/Gait Ambulation/Gait assistance: Min assist Gait Distance (Feet): 70 Feet Assistive device: Rolling walker (2 wheeled) Gait Pattern/deviations: Step-to pattern;Decreased step length - right;Decreased step length - left;Shuffle;Trunk flexed Gait velocity: decr   General Gait Details: cues for sequence, posture and position from Rw  Stairs            Wheelchair Mobility    Modified Rankin (Stroke Patients Only)       Balance Overall balance assessment: Needs assistance Sitting-balance support: No upper extremity supported;Feet supported Sitting balance-Leahy Scale: Good     Standing balance support: Bilateral upper extremity  supported Standing balance-Leahy Scale: Poor                               Pertinent Vitals/Pain Pain Assessment: 0-10 Pain Score: 10-Worst pain ever Pain Location: R knee Pain Descriptors / Indicators: Aching;Grimacing;Guarding;Sore Pain Intervention(s): Limited activity within patient's tolerance;Monitored during session;Premedicated before session;Ice applied    Home Living Family/patient expects to be discharged to:: Private residence Living Arrangements: Other (Comment) Available Help at Discharge: Available 24 hours/day;Friend(s) Type of Home: Apartment Home Access: Level entry     Home Layout: Two level;1/2 bath on main level Home Equipment: Cane - single point;Walker - 2 wheels;Bedside commode Additional Comments: Pt states her man friend will be assisting her    Prior Function Level of Independence: Needs assistance   Gait / Transfers Assistance Needed: using cane           Hand Dominance   Dominant Hand: Right    Extremity/Trunk Assessment   Upper Extremity Assessment Upper Extremity Assessment: Overall WFL for tasks assessed    Lower Extremity Assessment Lower Extremity Assessment: RLE deficits/detail RLE: Unable to fully assess due to pain    Cervical / Trunk Assessment Cervical / Trunk Assessment: Normal  Communication   Communication: No difficulties  Cognition Arousal/Alertness: Awake/alert Behavior During Therapy: WFL for tasks assessed/performed Overall Cognitive Status: Within Functional Limits for tasks assessed  General Comments      Exercises Total Joint Exercises Ankle Circles/Pumps: AROM;Both;15 reps;Supine   Assessment/Plan    PT Assessment Patient needs continued PT services  PT Problem List Decreased strength;Decreased range of motion;Decreased activity tolerance;Decreased balance;Decreased mobility;Decreased knowledge of use of DME;Pain       PT  Treatment Interventions DME instruction;Gait training;Stair training;Functional mobility training;Therapeutic activities;Therapeutic exercise;Patient/family education    PT Goals (Current goals can be found in the Care Plan section)  Acute Rehab PT Goals Patient Stated Goal: Regain IND PT Goal Formulation: With patient Time For Goal Achievement: 01/25/21 Potential to Achieve Goals: Good    Frequency 7X/week   Barriers to discharge        Co-evaluation               AM-PAC PT "6 Clicks" Mobility  Outcome Measure Help needed turning from your back to your side while in a flat bed without using bedrails?: A Little Help needed moving from lying on your back to sitting on the side of a flat bed without using bedrails?: A Little Help needed moving to and from a bed to a chair (including a wheelchair)?: A Lot Help needed standing up from a chair using your arms (e.g., wheelchair or bedside chair)?: A Lot Help needed to walk in hospital room?: A Little Help needed climbing 3-5 steps with a railing? : A Lot 6 Click Score: 15    End of Session Equipment Utilized During Treatment: Gait belt;Right knee immobilizer Activity Tolerance: Patient limited by pain Patient left: in chair;with call bell/phone within reach;with chair alarm set;with nursing/sitter in room Nurse Communication: Mobility status PT Visit Diagnosis: Unsteadiness on feet (R26.81);Difficulty in walking, not elsewhere classified (R26.2);Pain Pain - Right/Left: Right Pain - part of body: Knee    Time: 1025-1050 PT Time Calculation (min) (ACUTE ONLY): 25 min   Charges:   PT Evaluation $PT Eval Low Complexity: 1 Low PT Treatments $Gait Training: 8-22 mins        Debe Coder PT Acute Rehabilitation Services Pager [phone] Office [phone]   Robey Massmann 01/18/2021, 12:34 PM
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
## 2021-01-18 NOTE — Plan of Care (Signed)
  Problem: Education: Goal: Knowledge of General Education information will improve Description Including pain rating scale, medication(s)/side effects and non-pharmacologic comfort measures Outcome: Progressing   

## 2021-01-18 NOTE — TOC Transition Note (Signed)
Transition of Care [facility]) - CM/SW Discharge Note   Patient Details  Name: Elizabeth Bush MRN: [id number] Date of Birth: 1970-07-26  [facility] CM/SW Contact:  Lennart Pall, LCSW Phone Number: 01/18/2021, 1:42 PM   Clinical Narrative:    Have reached out to all area [facility] agencies and none were able to provide HHPT services either due to staffing or insurance coverage.  Pt had requested 3n1 commode, however, when I attempted to order for her, was informed by [employer] that they just provided a rw and 3n1 commode last year.  Pt aware that, unfortunately, I cannot secure HH or the DME.  Encouraged her to reach out to MD office to see if OPPT might be arranged.  No further TOC needs.   Final next level of care: Home/Self Care Barriers to Discharge: No [facility] will accept this patient   Patient Goals and CMS Choice Patient states their goals for this hospitalization and ongoing recovery are:: return home      Discharge Placement                       Discharge Plan and Services                                     Social Determinants of Health (SDOH) Interventions     Readmission Risk Interventions No flowsheet data found.

## 2021-01-18 NOTE — Discharge Summary (Signed)
.  dcs1

## 2021-01-18 NOTE — Plan of Care (Signed)
  Problem: Activity: Goal: Risk for activity intolerance will decrease Outcome: Progressing   Problem: Nutrition: Goal: Adequate nutrition will be maintained Outcome: Progressing   Problem: Coping: Goal: Level of anxiety will decrease Outcome: Progressing   Problem: Elimination: Goal: Will not experience complications related to urinary retention Outcome: Progressing   

## 2021-01-18 NOTE — Progress Notes (Signed)
Physical Therapy Treatment Patient Details Name: Elizabeth Bush MRN: [id number] DOB: 04-Feb-1971 Today's Date: 01/18/2021    History of Present Illness Pt s/p R TKR and with hx of L TKR last year    PT Comments    Pt up in chair on arrival and tearful regarding pain level despite premed ~ 1 hour prior.  Pt assisted from chair to ambulate limited distance to bed and assisted to bed.  Rn alerted and providing IV pain med.  Pt positioned for comfort with ice machine in place and running.  Will follow in am.   Follow Up Recommendations  Home health PT;Follow surgeon's recommendation for DC plan and follow-up therapies     Equipment Recommendations  None recommended by PT    Recommendations for Other Services       Precautions / Restrictions Precautions Precautions: Fall Restrictions Weight Bearing Restrictions: No RLE Weight Bearing: Weight bearing as tolerated    Mobility  Bed Mobility Overal bed mobility: Needs Assistance Bed Mobility: Sit to Supine       Sit to supine: Min assist   General bed mobility comments: Increased time with cues for sequence; assist for R LE    Transfers Overall transfer level: Needs assistance Equipment used: Rolling walker (2 wheeled) Transfers: Sit to/from Stand Sit to Stand: Min assist;Mod assist;From elevated surface         General transfer comment: cues for LE management and use of UEs to self assist.  Physical assist to bring wt up and fwd and to balance in standing with RW  Ambulation/Gait Ambulation/Gait assistance: Min assist;Mod assist Gait Distance (Feet): 5 Feet Assistive device: Rolling walker (2 wheeled) Gait Pattern/deviations: Step-to pattern;Decreased step length - right;Decreased step length - left;Shuffle;Trunk flexed Gait velocity: decr   General Gait Details: cues for sequence, posture and position from Rw   Stairs             Wheelchair Mobility    Modified Rankin (Stroke Patients Only)        Balance Overall balance assessment: Needs assistance Sitting-balance support: No upper extremity supported;Feet supported Sitting balance-Leahy Scale: Good     Standing balance support: Bilateral upper extremity supported Standing balance-Leahy Scale: Poor                              Cognition Arousal/Alertness: Awake/alert Behavior During Therapy: WFL for tasks assessed/performed Overall Cognitive Status: Within Functional Limits for tasks assessed                                        Exercises      General Comments        Pertinent Vitals/Pain Pain Assessment: 0-10 Pain Score: 10-Worst pain ever Pain Location: R knee Pain Descriptors / Indicators: Aching;Grimacing;Guarding;Sore;Crying Pain Intervention(s): Limited activity within patient's tolerance;Monitored during session;Premedicated before session;Patient requesting pain meds-RN notified;Ice applied;RN gave pain meds during session    Home Living                      Prior Function            PT Goals (current goals can now be found in the care plan section) Acute Rehab PT Goals Patient Stated Goal: Regain IND PT Goal Formulation: With patient Time For Goal Achievement: 01/25/21 Potential to Achieve Goals: Good Progress towards PT  goals: Not progressing toward goals - comment (pain)    Frequency    7X/week      PT Plan Current plan remains appropriate    Co-evaluation              AM-PAC PT "6 Clicks" Mobility   Outcome Measure  Help needed turning from your back to your side while in a flat bed without using bedrails?: A Little Help needed moving from lying on your back to sitting on the side of a flat bed without using bedrails?: A Little Help needed moving to and from a bed to a chair (including a wheelchair)?: A Lot Help needed standing up from a chair using your arms (e.g., wheelchair or bedside chair)?: A Lot Help needed to walk in hospital  room?: A Lot Help needed climbing 3-5 steps with a railing? : A Lot 6 Click Score: 14    End of Session Equipment Utilized During Treatment: Gait belt;Right knee immobilizer Activity Tolerance: Patient limited by pain Patient left: in bed;with call bell/phone within reach;with bed alarm set;with nursing/sitter in room Nurse Communication: Mobility status;Patient requests pain meds PT Visit Diagnosis: Unsteadiness on feet (R26.81);Difficulty in walking, not elsewhere classified (R26.2);Pain Pain - Right/Left: Right Pain - part of body: Knee     Time: [id number] PT Time Calculation (min) (ACUTE ONLY): 22 min  Charges:  $Therapeutic Activity: 8-22 mins                     Debe Coder PT Acute Rehabilitation Services Pager [phone] Office [phone]    [facility] 01/18/2021, 4:33 PM

## 2021-01-18 NOTE — Progress Notes (Signed)
Spoke with Dr. Basil Dess, MD to indicate that patient is not ready to be discharged today due to problems with pain control and patient needing to look into outpatient PT. New orders from Dr. Basil Dess, MD:  Oxycontin 10 mg every 12 hours and Gabapentin 100 mg 3x a day. Will continue to monitor patient.

## 2021-01-18 NOTE — Progress Notes (Signed)
Call from nursing pain is not controlled with current regimen. I will start oxycontin 10 mg every 12 hours and continue the intermittant percocet. Start gabapentin 100 mg po tid.  If pain is better then discharge in AM.

## 2021-01-19 DIAGNOSIS — M1711 Unilateral primary osteoarthritis, right knee: Secondary | ICD-10-CM | POA: Diagnosis not present

## 2021-01-19 DIAGNOSIS — F17210 Nicotine dependence, cigarettes, uncomplicated: Secondary | ICD-10-CM | POA: Diagnosis not present

## 2021-01-19 DIAGNOSIS — Z79899 Other long term (current) drug therapy: Secondary | ICD-10-CM | POA: Diagnosis not present

## 2021-01-19 DIAGNOSIS — I10 Essential (primary) hypertension: Secondary | ICD-10-CM | POA: Diagnosis not present

## 2021-01-19 DIAGNOSIS — Z96652 Presence of left artificial knee joint: Secondary | ICD-10-CM | POA: Diagnosis not present

## 2021-01-19 MED ORDER — DOCUSATE SODIUM 100 MG PO CAPS: 100.0000 mg | ORAL_CAPSULE | Freq: Two times a day (BID) | ORAL | 0 refills | Status: DC

## 2021-01-19 MED ORDER — OXYCODONE HCL ER 10 MG PO T12A: 10.0000 mg | EXTENDED_RELEASE_TABLET | Freq: Two times a day (BID) | ORAL | 0 refills | Status: AC

## 2021-01-19 NOTE — Progress Notes (Signed)
     Subjective: 2 Days Post-Op Procedure(s) (LRB): RIGHT TOTAL KNEE ARTHROPLASTY (Right) Awake, alert and oriented x 4. Nursing reports confusion last evening. VSS. Was scheduled to go home yesterday but issues of pain control and PT follow up on a long 3 day week end for total knee replacement rehabilitation prevented discharge. She took pain  meds added oxycontin to regimen and will change to MSContin or Morphine ER today.  She is anxious to go home and reports that she did will with last knee replacement but it was not as painful and her insurance company with last surgery allowed [facility] for PT, it is not approved this time. Plan was to allow for PT  today then she can be discharged with office working to get out patient PT set up for her. Change meds to include oxycontine 10 mg Q12 hours and breakthrough oxy IR and stop tylenol which increases risk of hepatic Affects.   Patient reports pain as moderate.    Objective:   VITALS:  Temp:  [97.8 F (36.6 C)-98 F (36.7 C)] 98 F (36.7 C) (09/03 2200) Pulse Rate:  [94-106] 106 (09/03 2200) Resp:  [17-18] 17 (09/03 2200) BP: (132-154)/(85-112) 154/112 (09/03 2200) SpO2:  [100 %] 100 % (09/03 2200)  Neurologically intact ABD soft Neurovascular intact Sensation intact distally Intact pulses distally Dorsiflexion/Plantar flexion intact Incision: moderate drainage and dressing changed, ACE wrap applied, given additional Mepilex bandaids to assist with dressing changes.    LABS Recent Labs    01/18/21 0247  HGB 10.1*  WBC 7.6  PLT 212   Recent Labs    01/18/21 0247  NA 130*  K 4.0  CL 98  CO2 25  BUN 12  CREATININE 0.58  GLUCOSE 179*   No results for input(s): LABPT, INR in the last 72 hours.   Assessment/Plan: 2 Days Post-Op Procedure(s) (LRB): RIGHT TOTAL KNEE ARTHROPLASTY (Right)  Advance diet Up with therapy D/C IV fluids Discharge home with no home health, she will need to call office on Tuesday when office  reopens to obtain Outpatient PT referral.   Basil Dess 01/19/2021, 9:12 AM Patient ID: Elizabeth Bush, female   DOB: Oct 15, 1970, 50 y.o.   MRN: [id number]

## 2021-01-19 NOTE — Plan of Care (Signed)
  Problem: Education: Goal: Knowledge of General Education information will improve Description: Including pain rating scale, medication(s)/side effects and non-pharmacologic comfort measures Outcome: Progressing   Problem: Health Behavior/Discharge Planning: Goal: Ability to manage health-related needs will improve Outcome: Progressing   Problem: Pain Managment: Goal: General experience of comfort will improve Outcome: Progressing   

## 2021-01-19 NOTE — Progress Notes (Signed)
The patient is alert and oriented and has been seen by her physician. The orders for discharge were written. IV had already been pulled out by the patient last night. Went over discharge instructions with patient and family. She is being discharged via wheelchair with all of her belongings.

## 2021-01-19 NOTE — Progress Notes (Signed)
Physical Therapy Treatment Patient Details Name: Elizabeth Bush MRN: [id number] DOB: 28-Jul-1970 Today's Date: 01/19/2021    History of Present Illness Pt s/p R TKR and with hx of L TKR last year    PT Comments    Pt very cooperative and with marked improvement in activity tolerance but continues pain limited.  Pt performed HEP with assist and written instruction provided and reviewed.  Pt reviewed KI use and ambulated increased distance in hall sans KI - states too uncomfortable.  Pt plans follow up with OP PT.   Follow Up Recommendations  Home health PT;Follow surgeon's recommendation for DC plan and follow-up therapies     Equipment Recommendations  None recommended by PT    Recommendations for Other Services       Precautions / Restrictions Precautions Precautions: Fall Restrictions Weight Bearing Restrictions: No RLE Weight Bearing: Weight bearing as tolerated    Mobility  Bed Mobility Overal bed mobility: Needs Assistance Bed Mobility: Supine to Sit     Supine to sit: Min assist     General bed mobility comments: Increased time with cues for sequence; assist for R LE    Transfers Overall transfer level: Needs assistance Equipment used: Rolling walker (2 wheeled) Transfers: Sit to/from Stand Sit to Stand: Min assist;Min guard         General transfer comment: cues for LE management and use of UEs to self assist.  Steady assist and two attempts to come up from low bed  Ambulation/Gait Ambulation/Gait assistance: Min guard;Supervision Gait Distance (Feet): 76 Feet Assistive device: Rolling walker (2 wheeled) Gait Pattern/deviations: Step-to pattern;Decreased step length - right;Decreased step length - left;Shuffle;Trunk flexed Gait velocity: decr   General Gait Details: cues for sequence, posture and position from Rw.  Pt states she will not wear KI as too uncomfortable - reinforced need for correct sequence to minimize chance of R knee buckling   Stairs              Wheelchair Mobility    Modified Rankin (Stroke Patients Only)       Balance Overall balance assessment: Needs assistance Sitting-balance support: No upper extremity supported;Feet supported Sitting balance-Leahy Scale: Good     Standing balance support: Bilateral upper extremity supported Standing balance-Leahy Scale: Fair                              Cognition Arousal/Alertness: Awake/alert Behavior During Therapy: WFL for tasks assessed/performed Overall Cognitive Status: Within Functional Limits for tasks assessed                                        Exercises Total Joint Exercises Ankle Circles/Pumps: AROM;Both;15 reps;Supine Quad Sets: AROM;Both;10 reps;Supine Heel Slides: AAROM;Right;15 reps;Supine Straight Leg Raises: AAROM;Right;15 reps;Supine Goniometric ROM: AAROM R knee -5 - 25 - pain limited with muscle guarding.  Pt reports very limited flexion prior to TKR    General Comments        Pertinent Vitals/Pain Pain Assessment: 0-10 Pain Score: 6  Pain Location: R knee Pain Descriptors / Indicators: Aching;Grimacing;Guarding;Sore Pain Intervention(s): Limited activity within patient's tolerance;Monitored during session;Premedicated before session    Home Living                      Prior Function  PT Goals (current goals can now be found in the care plan section) Acute Rehab PT Goals Patient Stated Goal: Regain IND PT Goal Formulation: With patient Time For Goal Achievement: 01/25/21 Potential to Achieve Goals: Good Progress towards PT goals: Progressing toward goals    Frequency    7X/week      PT Plan Current plan remains appropriate    Co-evaluation              AM-PAC PT "6 Clicks" Mobility   Outcome Measure  Help needed turning from your back to your side while in a flat bed without using bedrails?: A Little Help needed moving from lying on your back to sitting  on the side of a flat bed without using bedrails?: A Little Help needed moving to and from a bed to a chair (including a wheelchair)?: A Little Help needed standing up from a chair using your arms (e.g., wheelchair or bedside chair)?: A Little Help needed to walk in hospital room?: A Little Help needed climbing 3-5 steps with a railing? : A Lot 6 Click Score: 17    End of Session Equipment Utilized During Treatment: Gait belt;Right knee immobilizer Activity Tolerance: Patient limited by pain Patient left: in chair;with call bell/phone within reach;with chair alarm set Nurse Communication: Mobility status;Patient requests pain meds PT Visit Diagnosis: Unsteadiness on feet (R26.81);Difficulty in walking, not elsewhere classified (R26.2);Pain Pain - Right/Left: Right Pain - part of body: Knee     Time: [id number] PT Time Calculation (min) (ACUTE ONLY): 41 min  Charges:  $Gait Training: 8-22 mins $Therapeutic Exercise: 8-22 mins $Therapeutic Activity: 8-22 mins                     Debe Coder PT Acute Rehabilitation Services Pager [phone] Office [phone]    Natasha Paulson 01/19/2021, 11:07 AM

## 2021-01-21 ENCOUNTER — Telehealth: Payer: Self-pay

## 2021-01-21 ENCOUNTER — Encounter (HOSPITAL_COMMUNITY): Payer: Self-pay | Admitting: Orthopaedic Surgery
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
## 2021-01-21 NOTE — Telephone Encounter (Signed)
Transition Care Management Follow-up Telephone Call Date of discharge and from where: 01/19/2021-[place]  How have you been since you were released from the hospital? Patient stated she is ok, still having some pain.  Any questions or concerns? No  Items Reviewed: Did the pt receive and understand the discharge instructions provided? Yes  Medications obtained and verified? Yes  Other? No  Any new allergies since your discharge? No  Dietary orders reviewed? N/A Do you have support at home? No patient was provided with home health information from her PCP and she will call today to get assistance.   Home Care and Equipment/Supplies: Were home health services ordered? not applicable If so, what is the name of the agency? N/A  Has the agency set up a time to come to the patient's home? not applicable Were any new equipment or medical supplies ordered?  No What is the name of the medical supply agency? N/A Were you able to get the supplies/equipment? not applicable Do you have any questions related to the use of the equipment or supplies? No  Functional Questionnaire: (I = Independent and D = Dependent) ADLs: I  Bathing/Dressing- I  Meal Prep- I  Eating- I  Maintaining continence- I  Transferring/Ambulation- I  Managing Meds- I  Follow up appointments reviewed:  PCP Hospital f/u appt confirmed? No   Specialist Hospital f/u appt confirmed? Yes  Scheduled to see Dr. Ninfa Linden on 01/30/2021 @ 3:00 pm. Are transportation arrangements needed? No  If their condition worsens, is the pt aware to call PCP or go to the Emergency Dept.? Yes Was the patient provided with contact information for the PCP's office or ED? Yes Was to pt encouraged to call back with questions or concerns? Yes

## 2021-01-23 ENCOUNTER — Other Ambulatory Visit: Payer: Self-pay | Admitting: *Deleted

## 2021-01-23 ENCOUNTER — Other Ambulatory Visit: Payer: Self-pay

## 2021-01-23 ENCOUNTER — Telehealth: Payer: Self-pay | Admitting: Orthopaedic Surgery

## 2021-01-23 DIAGNOSIS — Z96651 Presence of right artificial knee joint: Secondary | ICD-10-CM

## 2021-01-23 NOTE — Telephone Encounter (Signed)
Pt's lawyer calling for pt Elizabeth Bush) wanting to start the process of getting pt home health. Pt had op on 01/17/21 with Ninfa Linden and has post op sch for 01/30/21. The best call back number for her is [phone].

## 2021-01-23 NOTE — Patient Instructions (Signed)
Visit Information  Ms. Braniya M Finkbiner  - as a part of your Medicaid benefit, you are eligible for care management and care coordination services at no cost or copay. I was unable to reach you by phone today but would be happy to help you with your health related needs. Please feel free to call me @ [phone].   A member of the Managed Medicaid care management team will reach out to you again over the next 7 days.   [name], BSN [place]  [employer] manager

## 2021-01-23 NOTE — Patient Outreach (Signed)
Care Coordination  01/23/2021  Elizabeth Bush 06-14-1970 [id number]   Medicaid Managed Care   Unsuccessful Outreach Note  01/23/2021 Name: Elizabeth Bush MRN: [id number] DOB: [birth year]  Referred by: Ladell Pier, MD Reason for referral : High Risk Managed Medicaid (Unsuccessful RNCM follow up outreach)   An unsuccessful telephone outreach was attempted today. The patient was referred to the case management team for assistance with care management and care coordination.   Follow Up Plan: A HIPAA compliant phone message was left for the patient providing contact information and requesting a return call.   [name], BSN [place]  [employer] manager

## 2021-01-24 ENCOUNTER — Other Ambulatory Visit: Payer: Self-pay

## 2021-01-24 ENCOUNTER — Other Ambulatory Visit: Payer: Self-pay | Admitting: *Deleted
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
## 2021-01-27 ENCOUNTER — Other Ambulatory Visit: Payer: Self-pay | Admitting: Orthopaedic Surgery

## 2021-01-27 ENCOUNTER — Telehealth: Payer: Self-pay

## 2021-01-27 DIAGNOSIS — M1711 Unilateral primary osteoarthritis, right knee: Secondary | ICD-10-CM | POA: Diagnosis not present

## 2021-01-27 NOTE — Telephone Encounter (Signed)
[employer] called and needs to speak with Caryl Pina regarding a prior auth for a medication.   Her name is [place] her direct number is [phone]

## 2021-01-27 NOTE — Telephone Encounter (Signed)
Returned call, left voice mail.

## 2021-01-27 NOTE — Telephone Encounter (Signed)
ok 

## 2021-01-28 ENCOUNTER — Other Ambulatory Visit: Payer: Self-pay | Admitting: Internal Medicine

## 2021-01-28 ENCOUNTER — Ambulatory Visit: Payer: Medicaid Other

## 2021-01-28 DIAGNOSIS — I10 Essential (primary) hypertension: Secondary | ICD-10-CM

## 2021-01-28 MED ORDER — CELECOXIB 200 MG PO CAPS: 200.0000 mg | ORAL_CAPSULE | Freq: Every day | ORAL | 1 refills | Status: DC

## 2021-01-28 MED ORDER — AMLODIPINE BESYLATE 10 MG PO TABS: 10.0000 mg | ORAL_TABLET | Freq: Every day | ORAL | 3 refills | Status: DC

## 2021-01-28 MED ORDER — OMEPRAZOLE 20 MG PO CPDR: 20.0000 mg | DELAYED_RELEASE_CAPSULE | Freq: Every day | ORAL | 1 refills | Status: DC

## 2021-01-29 ENCOUNTER — Telehealth: Payer: Self-pay | Admitting: Orthopaedic Surgery

## 2021-01-29 DIAGNOSIS — M1711 Unilateral primary osteoarthritis, right knee: Secondary | ICD-10-CM | POA: Diagnosis not present

## 2021-01-29 NOTE — Telephone Encounter (Signed)
Can you help me with this please? Not sure what to do about lawyers

## 2021-01-29 NOTE — Telephone Encounter (Signed)
[place] law called about this pt. Her name is Elizabeth Bush and her cb is [phone]

## 2021-01-30 ENCOUNTER — Ambulatory Visit (HOSPITAL_COMMUNITY)
Admission: RE | Admit: 2021-01-30 | Discharge: 2021-01-30 | Disposition: A | Discharge status: Home / Self Care | Payer: Medicaid Other | Source: Ambulatory Visit | Attending: Orthopaedic Surgery | Admitting: Orthopaedic Surgery

## 2021-01-30 ENCOUNTER — Encounter: Payer: Self-pay | Admitting: Orthopaedic Surgery

## 2021-01-30 ENCOUNTER — Ambulatory Visit (INDEPENDENT_AMBULATORY_CARE_PROVIDER_SITE_OTHER): Payer: Medicaid Other | Admitting: Orthopaedic Surgery

## 2021-01-30 ENCOUNTER — Other Ambulatory Visit: Payer: Self-pay

## 2021-01-30 VITALS — BP 174/85 | HR 112 | Temp 99.6°F

## 2021-01-30 DIAGNOSIS — Z96651 Presence of right artificial knee joint: Secondary | ICD-10-CM

## 2021-01-30 MED ORDER — DOXYCYCLINE HYCLATE 100 MG PO CAPS: 100.0000 mg | ORAL_CAPSULE | Freq: Two times a day (BID) | ORAL | 0 refills | Status: DC

## 2021-01-30 NOTE — Addendum Note (Signed)
Addended by: Precious Bard on: 01/30/2021 03:45 PM   Modules accepted: Orders

## 2021-01-30 NOTE — Progress Notes (Signed)
RLE venous duplex has been completed.  Preliminary results given to Dr. Ninfa Linden.   Results can be found under chart review under CV PROC. 01/30/2021 5:22 PM Glanda Spanbauer RVT, RDMS

## 2021-01-30 NOTE — Progress Notes (Signed)
The patient comes in today almost 2 weeks status post a right total knee arthroplasty.  She does report significant right knee pain and swelling as well as calf pain and swelling in the foot.  She has been on a baby aspirin twice a day.  On exam her suture line looks good.  The sutures are intact and there is no drainage.  There is abundant swelling though with her knee but also her calf and her foot with calf pain.  This is certainly concerning that she could have a DVT.  We need to send her for a Doppler ultrasound to rule out DVT.  She will continue her twice a day aspirin until further notice.  If she does have a DVT we would then start her on therapeutic dose Xarelto or Eliquis.  She already has a refill of her pain medication.  This does not look like there is an infection but I will prophylactically put her on some doxycycline as well.  We will keep the staples in the day and see her back next week to have those removed.  All question concerns were answered addressed.

## 2021-01-31 ENCOUNTER — Ambulatory Visit: Payer: Medicaid Other

## 2021-01-31 DIAGNOSIS — M1711 Unilateral primary osteoarthritis, right knee: Secondary | ICD-10-CM | POA: Diagnosis not present

## 2021-02-01 ENCOUNTER — Ambulatory Visit: Payer: Medicaid Other | Admitting: Physical Therapy

## 2021-02-03 ENCOUNTER — Other Ambulatory Visit: Payer: Self-pay | Admitting: Orthopaedic Surgery

## 2021-02-03 DIAGNOSIS — M1711 Unilateral primary osteoarthritis, right knee: Secondary | ICD-10-CM | POA: Diagnosis not present

## 2021-02-03 NOTE — Telephone Encounter (Signed)
ok 

## 2021-02-05 DIAGNOSIS — M1711 Unilateral primary osteoarthritis, right knee: Secondary | ICD-10-CM | POA: Diagnosis not present

## 2021-02-06 ENCOUNTER — Telehealth: Payer: Self-pay

## 2021-02-06 ENCOUNTER — Other Ambulatory Visit: Payer: Self-pay

## 2021-02-06 ENCOUNTER — Telehealth: Payer: Self-pay | Admitting: Internal Medicine

## 2021-02-06 ENCOUNTER — Other Ambulatory Visit: Payer: Self-pay | Admitting: *Deleted

## 2021-02-06 DIAGNOSIS — M170 Bilateral primary osteoarthritis of knee: Secondary | ICD-10-CM

## 2021-02-06 DIAGNOSIS — I10 Essential (primary) hypertension: Secondary | ICD-10-CM

## 2021-02-06 MED ORDER — AMLODIPINE BESYLATE 10 MG PO TABS: 10.0000 mg | ORAL_TABLET | Freq: Every day | ORAL | 3 refills | Status: DC

## 2021-02-06 MED ORDER — METHOCARBAMOL 500 MG PO TABS: 500.0000 mg | ORAL_TABLET | Freq: Four times a day (QID) | ORAL | 1 refills | Status: DC | PRN

## 2021-02-06 MED ORDER — OMEPRAZOLE 20 MG PO CPDR: 20.0000 mg | DELAYED_RELEASE_CAPSULE | Freq: Every day | ORAL | 1 refills | Status: DC

## 2021-02-06 MED ORDER — CELECOXIB 200 MG PO CAPS: 200.0000 mg | ORAL_CAPSULE | Freq: Every day | ORAL | 1 refills | Status: DC
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
## 2021-02-06 NOTE — Telephone Encounter (Signed)
Scheduled next Tuesday morning

## 2021-02-06 NOTE — Telephone Encounter (Signed)
Rx sent for these prescriptions.

## 2021-02-06 NOTE — Telephone Encounter (Signed)
Pt called stating that she needed an apt to get the staples taken out of her knee. Please advise im he has nothing available next week

## 2021-02-06 NOTE — Telephone Encounter (Signed)
Dorothea Ogle is calling from [employer] is calling because he received new scripts for the pt however there where no instructions.  Requesting if medications can be escripted or faxed with direcation - the tramadol needs to be escripted.  amLODipine (NORVASC) 10 MG tablet [[id number]] ,celecoxib (CELEBREX) 200 MG capsule [[id number]] ,methocarbamol (ROBAXIN) 500 MG tablet [[id number]], omeprazole (PRILOSEC) 20 MG capsule [[id number]] ,

## 2021-02-06 NOTE — Telephone Encounter (Signed)
-----   Message from [name], RN sent at 02/06/2021 10:01 AM EDT ----- Regarding: DME Hi Dr. Wynetta Emery,  This patient is requesting a raised toilet seat. Can you place an order? Thank you!  [name], BSN [place]  [employer] manager
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
## 2021-02-07 DIAGNOSIS — M1711 Unilateral primary osteoarthritis, right knee: Secondary | ICD-10-CM | POA: Diagnosis not present

## 2021-02-08 DIAGNOSIS — S065X9A Traumatic subdural hemorrhage with loss of consciousness of unspecified duration, initial encounter: Secondary | ICD-10-CM | POA: Diagnosis not present

## 2021-02-10 DIAGNOSIS — M1711 Unilateral primary osteoarthritis, right knee: Secondary | ICD-10-CM | POA: Diagnosis not present

## 2021-02-11 ENCOUNTER — Ambulatory Visit (INDEPENDENT_AMBULATORY_CARE_PROVIDER_SITE_OTHER): Payer: Medicaid Other | Admitting: Orthopaedic Surgery

## 2021-02-11 ENCOUNTER — Encounter: Payer: Self-pay | Admitting: Orthopaedic Surgery

## 2021-02-11 DIAGNOSIS — Z96651 Presence of right artificial knee joint: Secondary | ICD-10-CM

## 2021-02-11 MED ORDER — OXYCODONE HCL 5 MG PO TABS: ORAL_TABLET | ORAL | 0 refills | Status: DC

## 2021-02-11 MED ORDER — DOXYCYCLINE HYCLATE 100 MG PO CAPS: 100.0000 mg | ORAL_CAPSULE | Freq: Two times a day (BID) | ORAL | 0 refills | Status: DC

## 2021-02-11 NOTE — Progress Notes (Signed)
The patient is almost 3 weeks out from a knee replacement for her right knee.  When I saw her last week at her 2-week visit there was concern about having a DVT.  Her leg and knee were quite swollen.  We left the staples then we sent her for a Doppler ultrasound.  There was no evidence of a DVT.  She comes in today and the swelling has gone down dramatically with her right knee and calf.  Her incision looks good.  The staples were removed and Steri-Strips applied.  Her calf is soft and her foot is well-perfused.  I am able to fully extend her knee and flexes it to just past 90 degrees.  Overall she looks much better and she feels better.  She is requesting a refill on her oxycodone.  She would like me to refill the doxycycline 1 more time because that is helped with her skin in terms of acne on her face.  I told her I would not have her on this long-term.  She does state that someone called her from outpatient rehab at [facility] and I reiterated that she needs to call them back to set up outpatient physical therapy.  From my standpoint, I will see her back in a month to see how she is doing overall but no x-rays are needed.  All questions and concerns were answered and addressed.

## 2021-02-11 NOTE — Telephone Encounter (Signed)
Dr. Wynetta Emery gave me rx and I already faxed it to [place]

## 2021-02-11 NOTE — Telephone Encounter (Signed)
Thanks

## 2021-02-12 DIAGNOSIS — M1711 Unilateral primary osteoarthritis, right knee: Secondary | ICD-10-CM | POA: Diagnosis not present

## 2021-02-15 DIAGNOSIS — Z419 Encounter for procedure for purposes other than remedying health state, unspecified: Secondary | ICD-10-CM | POA: Diagnosis not present

## 2021-02-19 DIAGNOSIS — M1711 Unilateral primary osteoarthritis, right knee: Secondary | ICD-10-CM | POA: Diagnosis not present

## 2021-02-20 ENCOUNTER — Ambulatory Visit: Payer: Medicaid Other | Attending: Internal Medicine | Admitting: Physical Therapy

## 2021-02-20 ENCOUNTER — Encounter: Payer: Self-pay | Admitting: Physical Therapy

## 2021-02-20 ENCOUNTER — Other Ambulatory Visit: Payer: Self-pay

## 2021-02-20 DIAGNOSIS — M6281 Muscle weakness (generalized): Secondary | ICD-10-CM | POA: Diagnosis not present

## 2021-02-20 DIAGNOSIS — M25661 Stiffness of right knee, not elsewhere classified: Secondary | ICD-10-CM | POA: Diagnosis not present

## 2021-02-20 DIAGNOSIS — R2689 Other abnormalities of gait and mobility: Secondary | ICD-10-CM | POA: Insufficient documentation

## 2021-02-20 DIAGNOSIS — R600 Localized edema: Secondary | ICD-10-CM | POA: Diagnosis not present

## 2021-02-20 DIAGNOSIS — G8929 Other chronic pain: Secondary | ICD-10-CM | POA: Diagnosis not present

## 2021-02-20 DIAGNOSIS — M25662 Stiffness of left knee, not elsewhere classified: Secondary | ICD-10-CM | POA: Diagnosis not present

## 2021-02-20 DIAGNOSIS — M25561 Pain in right knee: Secondary | ICD-10-CM | POA: Diagnosis not present

## 2021-02-20 NOTE — Patient Instructions (Signed)
Access Code: [email or web address] URL: [email or web address] Date: 02/20/2021 Prepared by: Hilda Blades  Exercises Supine Quad Set - 3-4 x daily - 7 x weekly - 10 reps - 5 hold Supine Calf Stretch with Strap - 3-4 x daily - 7 x weekly - 3 reps - 20 hold Supine Heel Slide with Strap - 3-4 x daily - 7 x weekly - 10 reps - 5 hold Small Range Straight Leg Raise - 3-4 x daily - 7 x weekly - 2 sets - 5 reps Modified Thomas Stretch - 3-4 x daily - 7 x weekly - 3 reps - 30 hold Seated Long Arc Quad - 3-4 x daily - 7 x weekly - 10 reps Seated Hamstring Stretch - 3-4 x daily - 7 x weekly - 3 reps - 20 hold
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
## 2021-02-21 DIAGNOSIS — M1711 Unilateral primary osteoarthritis, right knee: Secondary | ICD-10-CM | POA: Diagnosis not present

## 2021-02-26 DIAGNOSIS — M1711 Unilateral primary osteoarthritis, right knee: Secondary | ICD-10-CM | POA: Diagnosis not present

## 2021-02-27 ENCOUNTER — Ambulatory Visit: Payer: Medicaid Other

## 2021-02-27 ENCOUNTER — Other Ambulatory Visit: Payer: Self-pay

## 2021-02-27 DIAGNOSIS — M6281 Muscle weakness (generalized): Secondary | ICD-10-CM

## 2021-02-27 DIAGNOSIS — M25661 Stiffness of right knee, not elsewhere classified: Secondary | ICD-10-CM

## 2021-02-27 DIAGNOSIS — M25561 Pain in right knee: Secondary | ICD-10-CM | POA: Diagnosis not present

## 2021-02-27 DIAGNOSIS — G8929 Other chronic pain: Secondary | ICD-10-CM

## 2021-02-27 DIAGNOSIS — R2689 Other abnormalities of gait and mobility: Secondary | ICD-10-CM

## 2021-02-27 DIAGNOSIS — M25662 Stiffness of left knee, not elsewhere classified: Secondary | ICD-10-CM | POA: Diagnosis not present

## 2021-02-27 DIAGNOSIS — R600 Localized edema: Secondary | ICD-10-CM | POA: Diagnosis not present
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
## 2021-03-01 ENCOUNTER — Ambulatory Visit: Payer: Medicaid Other

## 2021-03-03 DIAGNOSIS — R32 Unspecified urinary incontinence: Secondary | ICD-10-CM | POA: Diagnosis not present

## 2021-03-05 ENCOUNTER — Ambulatory Visit: Payer: Medicaid Other | Admitting: Physical Therapy

## 2021-03-05 ENCOUNTER — Other Ambulatory Visit: Payer: Self-pay

## 2021-03-05 ENCOUNTER — Encounter: Payer: Self-pay | Admitting: Physical Therapy

## 2021-03-05 DIAGNOSIS — M25661 Stiffness of right knee, not elsewhere classified: Secondary | ICD-10-CM | POA: Diagnosis not present

## 2021-03-05 DIAGNOSIS — M25662 Stiffness of left knee, not elsewhere classified: Secondary | ICD-10-CM | POA: Diagnosis not present

## 2021-03-05 DIAGNOSIS — R2689 Other abnormalities of gait and mobility: Secondary | ICD-10-CM | POA: Diagnosis not present

## 2021-03-05 DIAGNOSIS — M6281 Muscle weakness (generalized): Secondary | ICD-10-CM

## 2021-03-05 DIAGNOSIS — G8929 Other chronic pain: Secondary | ICD-10-CM

## 2021-03-05 DIAGNOSIS — R600 Localized edema: Secondary | ICD-10-CM

## 2021-03-05 DIAGNOSIS — M25561 Pain in right knee: Secondary | ICD-10-CM | POA: Diagnosis not present
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
## 2021-03-07 ENCOUNTER — Ambulatory Visit: Payer: Medicaid Other

## 2021-03-07 ENCOUNTER — Other Ambulatory Visit: Payer: Self-pay

## 2021-03-07 ENCOUNTER — Encounter: Payer: Medicaid Other | Admitting: Physical Therapy

## 2021-03-07 DIAGNOSIS — G8929 Other chronic pain: Secondary | ICD-10-CM

## 2021-03-07 DIAGNOSIS — M6281 Muscle weakness (generalized): Secondary | ICD-10-CM

## 2021-03-07 DIAGNOSIS — R2689 Other abnormalities of gait and mobility: Secondary | ICD-10-CM | POA: Diagnosis not present

## 2021-03-07 DIAGNOSIS — M25661 Stiffness of right knee, not elsewhere classified: Secondary | ICD-10-CM

## 2021-03-07 DIAGNOSIS — M25561 Pain in right knee: Secondary | ICD-10-CM | POA: Diagnosis not present

## 2021-03-07 DIAGNOSIS — R600 Localized edema: Secondary | ICD-10-CM

## 2021-03-07 DIAGNOSIS — M25662 Stiffness of left knee, not elsewhere classified: Secondary | ICD-10-CM | POA: Diagnosis not present
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
## 2021-03-10 ENCOUNTER — Other Ambulatory Visit: Payer: Self-pay

## 2021-03-10 ENCOUNTER — Ambulatory Visit: Payer: Medicaid Other | Admitting: Physical Therapy

## 2021-03-10 ENCOUNTER — Encounter: Payer: Self-pay | Admitting: Physical Therapy

## 2021-03-10 DIAGNOSIS — M25662 Stiffness of left knee, not elsewhere classified: Secondary | ICD-10-CM | POA: Diagnosis not present

## 2021-03-10 DIAGNOSIS — G8929 Other chronic pain: Secondary | ICD-10-CM | POA: Diagnosis not present

## 2021-03-10 DIAGNOSIS — R600 Localized edema: Secondary | ICD-10-CM | POA: Diagnosis not present

## 2021-03-10 DIAGNOSIS — M25661 Stiffness of right knee, not elsewhere classified: Secondary | ICD-10-CM

## 2021-03-10 DIAGNOSIS — M6281 Muscle weakness (generalized): Secondary | ICD-10-CM

## 2021-03-10 DIAGNOSIS — M25561 Pain in right knee: Secondary | ICD-10-CM | POA: Diagnosis not present

## 2021-03-10 DIAGNOSIS — S065X9A Traumatic subdural hemorrhage with loss of consciousness of unspecified duration, initial encounter: Secondary | ICD-10-CM | POA: Diagnosis not present

## 2021-03-10 DIAGNOSIS — R2689 Other abnormalities of gait and mobility: Secondary | ICD-10-CM
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
## 2021-03-11 ENCOUNTER — Other Ambulatory Visit: Payer: Self-pay | Admitting: *Deleted

## 2021-03-11 NOTE — Patient Outreach (Signed)
Care Coordination  03/11/2021  GLORIA RICARDO [birth year] [phone]   Medicaid Managed Care   Unsuccessful Outreach Note  03/11/2021 Name: Elizabeth Bush MRN: [id number] DOB: [birth year]  Referred by: Ladell Pier, MD Reason for referral : High Risk Managed Medicaid (Unsuccessful RNCM follow up outreach)   An unsuccessful telephone outreach was attempted today. The patient was referred to the case management team for assistance with care management and care coordination.   Follow Up Plan: The care management team will reach out to the patient again over the next 14 days.   [name], BSN [place]  [employer] manager

## 2021-03-11 NOTE — Patient Instructions (Signed)
Visit Information  [name]  - as a part of your Medicaid benefit, you are eligible for care management and care coordination services at no cost or copay. I was unable to reach you by phone today but would be happy to help you with your health related needs. Please feel free to call me @ [phone].   A member of the Managed Medicaid care management team will reach out to you again over the next 14 days.   [name], BSN [place]  [employer] manager

## 2021-03-12 ENCOUNTER — Other Ambulatory Visit: Payer: Self-pay

## 2021-03-12 ENCOUNTER — Encounter: Payer: Self-pay | Admitting: Physical Therapy

## 2021-03-12 ENCOUNTER — Ambulatory Visit: Payer: Medicaid Other | Admitting: Physical Therapy

## 2021-03-12 DIAGNOSIS — M25561 Pain in right knee: Secondary | ICD-10-CM

## 2021-03-12 DIAGNOSIS — G8929 Other chronic pain: Secondary | ICD-10-CM | POA: Diagnosis not present

## 2021-03-12 DIAGNOSIS — M25661 Stiffness of right knee, not elsewhere classified: Secondary | ICD-10-CM | POA: Diagnosis not present

## 2021-03-12 DIAGNOSIS — R2689 Other abnormalities of gait and mobility: Secondary | ICD-10-CM | POA: Diagnosis not present

## 2021-03-12 DIAGNOSIS — R600 Localized edema: Secondary | ICD-10-CM

## 2021-03-12 DIAGNOSIS — M25662 Stiffness of left knee, not elsewhere classified: Secondary | ICD-10-CM | POA: Diagnosis not present

## 2021-03-12 DIAGNOSIS — M6281 Muscle weakness (generalized): Secondary | ICD-10-CM | POA: Diagnosis not present
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
## 2021-03-17 ENCOUNTER — Encounter: Payer: Self-pay | Admitting: Physical Therapy

## 2021-03-17 ENCOUNTER — Ambulatory Visit: Payer: Medicaid Other | Admitting: Physical Therapy

## 2021-03-17 ENCOUNTER — Other Ambulatory Visit: Payer: Self-pay

## 2021-03-17 DIAGNOSIS — R600 Localized edema: Secondary | ICD-10-CM | POA: Diagnosis not present

## 2021-03-17 DIAGNOSIS — M25661 Stiffness of right knee, not elsewhere classified: Secondary | ICD-10-CM

## 2021-03-17 DIAGNOSIS — R2689 Other abnormalities of gait and mobility: Secondary | ICD-10-CM

## 2021-03-17 DIAGNOSIS — M25561 Pain in right knee: Secondary | ICD-10-CM | POA: Diagnosis not present

## 2021-03-17 DIAGNOSIS — G8929 Other chronic pain: Secondary | ICD-10-CM

## 2021-03-17 DIAGNOSIS — M6281 Muscle weakness (generalized): Secondary | ICD-10-CM | POA: Diagnosis not present

## 2021-03-17 DIAGNOSIS — M25662 Stiffness of left knee, not elsewhere classified: Secondary | ICD-10-CM | POA: Diagnosis not present

## 2021-03-17 NOTE — Patient Instructions (Signed)
Access Code: [email or web address] URL: [email or web address] Date: 03/17/2021 Prepared by: Hilda Blades  Exercises Supine Quad Set - 3-4 x daily - 7 x weekly - 10 reps - 5 hold Supine Calf Stretch with Strap - 3-4 x daily - 7 x weekly - 3 reps - 20 hold Supine Heel Slide with Strap - 3-4 x daily - 7 x weekly - 10 reps - 5 hold Modified Thomas Stretch - 3-4 x daily - 7 x weekly - 3 reps - 30 hold Active Straight Leg Raise with Quad Set - 1 x daily - 7 x weekly - 2 sets - 10 reps Sidelying Hip Abduction - 1 x daily - 7 x weekly - 2 sets - 10 reps Seated Long Arc Quad - 1 x daily - 7 x weekly - 2 sets - 10 reps Seated Hamstring Stretch - 3-4 x daily - 7 x weekly - 3 reps - 20 hold Squat with Counter Support - 1 x daily - 7 x weekly - 2 sets - 10 reps
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
## 2021-03-18 ENCOUNTER — Other Ambulatory Visit: Payer: Self-pay

## 2021-03-18 DIAGNOSIS — Z419 Encounter for procedure for purposes other than remedying health state, unspecified: Secondary | ICD-10-CM | POA: Diagnosis not present

## 2021-03-19 ENCOUNTER — Ambulatory Visit: Payer: Medicaid Other | Attending: Internal Medicine | Admitting: Physical Therapy

## 2021-03-19 ENCOUNTER — Other Ambulatory Visit: Payer: Self-pay

## 2021-03-19 ENCOUNTER — Encounter: Payer: Self-pay | Admitting: Physical Therapy

## 2021-03-19 DIAGNOSIS — R2689 Other abnormalities of gait and mobility: Secondary | ICD-10-CM | POA: Insufficient documentation

## 2021-03-19 DIAGNOSIS — M6281 Muscle weakness (generalized): Secondary | ICD-10-CM | POA: Insufficient documentation

## 2021-03-19 DIAGNOSIS — G8929 Other chronic pain: Secondary | ICD-10-CM | POA: Diagnosis not present

## 2021-03-19 DIAGNOSIS — M25561 Pain in right knee: Secondary | ICD-10-CM | POA: Insufficient documentation

## 2021-03-19 DIAGNOSIS — M25661 Stiffness of right knee, not elsewhere classified: Secondary | ICD-10-CM | POA: Diagnosis not present

## 2021-03-19 DIAGNOSIS — R600 Localized edema: Secondary | ICD-10-CM | POA: Insufficient documentation

## 2021-03-19 DIAGNOSIS — R2681 Unsteadiness on feet: Secondary | ICD-10-CM | POA: Insufficient documentation
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
## 2021-03-19 NOTE — Patient Outreach (Signed)
[facility]) Care Management  03/19/2021  Elizabeth Bush 07/31/1970 [id number]  Patient denied needing mental health services or social work support at this time but is agreeable to contact [facility] LCSW directly if this changes.  Eula Fried, BSW, MSW, [employer] Managed Medicaid LCSW [email or web address] Phone: [phone]

## 2021-03-19 NOTE — Patient Instructions (Signed)
Visit Information  Ms. Pannone was given information about Medicaid Managed Care team care coordination services as a part of their [facility] Medicaid benefit. Marnesha Gagen Antonio verbally consented to engagement with the [facility] Managed Care team.   If you are experiencing a medical emergency, please call 911 or report to your local emergency department or urgent care.   If you have a non-emergency medical problem during routine business hours, please contact your provider's office and ask to speak with a nurse.   For questions related to your [facility] health plan, please call: [phone] or go here:[email or web address]  If you would like to schedule transportation through your [facility] plan, please call the following number at least 2 days in advance of your appointment: [phone].  Call the [place] at [phone], at any time, 24 hours a day, 7 days a week. If you are in danger or need immediate medical attention call 911.  If you would like help to quit smoking, call 1-800-QUIT-NOW 605-270-5410) OR Espaol: 1-855-Djelo-Ya (9-201-007-1219) o para ms informacin haga clic aqu or Text READY to 200-400 to register via text  Ms. Jagoda - following are the goals we discussed in your visit today:   Goals Addressed   None    Eula Fried, BSW, MSW, [employer] Managed Medicaid LCSW [email or web address] Phone: [phone]

## 2021-03-20 ENCOUNTER — Other Ambulatory Visit: Payer: Self-pay

## 2021-03-20 DIAGNOSIS — D500 Iron deficiency anemia secondary to blood loss (chronic): Secondary | ICD-10-CM

## 2021-03-21 ENCOUNTER — Inpatient Hospital Stay: Payer: Medicaid Other

## 2021-03-21 ENCOUNTER — Inpatient Hospital Stay: Payer: Medicaid Other | Attending: Hematology | Admitting: Hematology

## 2021-03-24 ENCOUNTER — Telehealth: Payer: Self-pay | Admitting: Hematology

## 2021-03-24 ENCOUNTER — Ambulatory Visit: Payer: Medicaid Other | Admitting: Physical Therapy

## 2021-03-24 NOTE — Telephone Encounter (Signed)
Attempted to sch per 11/4 inbasket, pt says she wil call back to schedule

## 2021-03-26 ENCOUNTER — Other Ambulatory Visit: Payer: Self-pay

## 2021-03-26 ENCOUNTER — Other Ambulatory Visit: Payer: Self-pay | Admitting: *Deleted

## 2021-03-26 ENCOUNTER — Ambulatory Visit: Payer: Medicaid Other

## 2021-03-26 DIAGNOSIS — G8929 Other chronic pain: Secondary | ICD-10-CM

## 2021-03-26 DIAGNOSIS — R2681 Unsteadiness on feet: Secondary | ICD-10-CM | POA: Diagnosis not present

## 2021-03-26 DIAGNOSIS — M6281 Muscle weakness (generalized): Secondary | ICD-10-CM | POA: Diagnosis not present

## 2021-03-26 DIAGNOSIS — M25661 Stiffness of right knee, not elsewhere classified: Secondary | ICD-10-CM

## 2021-03-26 DIAGNOSIS — R2689 Other abnormalities of gait and mobility: Secondary | ICD-10-CM | POA: Diagnosis not present

## 2021-03-26 DIAGNOSIS — R600 Localized edema: Secondary | ICD-10-CM | POA: Diagnosis not present

## 2021-03-26 DIAGNOSIS — Z5941 Food insecurity: Secondary | ICD-10-CM

## 2021-03-26 DIAGNOSIS — M25561 Pain in right knee: Secondary | ICD-10-CM | POA: Diagnosis not present

## 2021-03-26 DIAGNOSIS — Z599 Problem related to housing and economic circumstances, unspecified: Secondary | ICD-10-CM
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
## 2021-03-27 ENCOUNTER — Telehealth (INDEPENDENT_AMBULATORY_CARE_PROVIDER_SITE_OTHER): Payer: Self-pay
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
## 2021-03-27 NOTE — Telephone Encounter (Signed)
Will forward to provider  

## 2021-03-28 NOTE — Telephone Encounter (Signed)
Contacted pt to schedule a virtual appt pt has been scheduled for 11/14

## 2021-03-31 ENCOUNTER — Other Ambulatory Visit: Payer: Self-pay

## 2021-03-31 ENCOUNTER — Ambulatory Visit: Payer: Medicaid Other | Attending: Internal Medicine | Admitting: Internal Medicine

## 2021-03-31 DIAGNOSIS — R52 Pain, unspecified: Secondary | ICD-10-CM

## 2021-03-31 DIAGNOSIS — Z79899 Other long term (current) drug therapy: Secondary | ICD-10-CM

## 2021-03-31 DIAGNOSIS — D500 Iron deficiency anemia secondary to blood loss (chronic): Secondary | ICD-10-CM | POA: Diagnosis not present
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
## 2021-04-02 ENCOUNTER — Ambulatory Visit: Payer: Medicaid Other

## 2021-04-02 ENCOUNTER — Other Ambulatory Visit: Payer: Self-pay

## 2021-04-02 DIAGNOSIS — M6281 Muscle weakness (generalized): Secondary | ICD-10-CM | POA: Diagnosis not present

## 2021-04-02 DIAGNOSIS — M25661 Stiffness of right knee, not elsewhere classified: Secondary | ICD-10-CM | POA: Diagnosis not present

## 2021-04-02 DIAGNOSIS — R2689 Other abnormalities of gait and mobility: Secondary | ICD-10-CM | POA: Diagnosis not present

## 2021-04-02 DIAGNOSIS — R2681 Unsteadiness on feet: Secondary | ICD-10-CM | POA: Diagnosis not present

## 2021-04-02 DIAGNOSIS — R600 Localized edema: Secondary | ICD-10-CM | POA: Diagnosis not present

## 2021-04-02 DIAGNOSIS — M25561 Pain in right knee: Secondary | ICD-10-CM

## 2021-04-02 DIAGNOSIS — G8929 Other chronic pain: Secondary | ICD-10-CM | POA: Diagnosis not present
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
## 2021-04-03 DIAGNOSIS — R32 Unspecified urinary incontinence: Secondary | ICD-10-CM | POA: Diagnosis not present

## 2021-04-07 ENCOUNTER — Telehealth: Payer: Self-pay | Admitting: *Deleted

## 2021-04-07 NOTE — Telephone Encounter (Signed)
   Telephone encounter was:  Unsuccessful.  04/07/2021 Name: Elizabeth Bush MRN: [id number] DOB: 10-03-1970  Unsuccessful outbound call made today to assist with:  Food Insecurity  Outreach Attempt:  1st Attempt  A HIPAA compliant voice message was left requesting a return call.  Instructed patient to call back at   Instructed patient to call back at [phone]  at their earliest convenience. .  [place], Care Management  [phone] [address] , [place] [address] Email : [email or web address]

## 2021-04-09 ENCOUNTER — Other Ambulatory Visit: Payer: Self-pay | Admitting: *Deleted

## 2021-04-09 ENCOUNTER — Other Ambulatory Visit: Payer: Self-pay

## 2021-04-09 ENCOUNTER — Ambulatory Visit: Payer: Medicaid Other
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
## 2021-04-09 NOTE — Patient Instructions (Signed)
Visit Information  Elizabeth Bush was given information about Medicaid Managed Care team care coordination services as a part of their [facility] Medicaid benefit. Elizabeth Bush verbally consented to engagement with the [facility] Managed Care team.   If you are experiencing a medical emergency, please call 911 or report to your local emergency department or urgent care.   If you have a non-emergency medical problem during routine business hours, please contact your provider's office and ask to speak with a nurse.   For questions related to your [facility] health plan, please call: [phone] or go here:[email or web address]  If you would like to schedule transportation through your [facility] plan, please call the following number at least 2 days in advance of your appointment: [phone].  Call the [place] at [phone], at any time, 24 hours a day, 7 days a week. If you are in danger or need immediate medical attention call 911.  If you would like help to quit smoking, call 1-800-QUIT-NOW 253-129-7006) OR Espaol: 1-855-Djelo-Ya (9-622-297-9892) o para ms informacin haga clic aqu or Text READY to 200-400 to register via text  Elizabeth Bush - following are the goals we discussed in your visit today:   Goals Addressed   None     Please see education materials related to managing pain provided by MyChart link.  Patient has access to MyChart and can view provided education  Telephone follow up appointment with Managed Medicaid care management team member scheduled for:04/22/21 @ 12:30pm  [name], BSN [place] RN Care Coordinator   Following is a copy of your plan of care:  Care Plan : RN Care Manager Plan of Care  Updates made by [name], RN since 04/09/2021 12:00 AM     Problem: Care Coordination for managing back pain needed   Priority: High     Long-Range Goal: Develop plan of care for  managing back pain and SDOH barriers   Start Date: 03/26/2021  Expected End Date: 06/24/2021  Priority: High  Note:   Current Barriers:  Knowledge Deficits related to plan of care for management of back pain  Care Coordination needs related to Financial constraints related to affording utilities and Limited access to food   RNCM Clinical Goal(s):  Patient will verbalize understanding of plan for management of back pain as evidenced by scheduling a PCP appointment for evaluation and plan of care attend all scheduled medical appointments: PCP as evidenced by scheduling an appointment and attending all PT appointments        continue to work with [place] and/or Social Worker to address care management and care coordination needs related to back pain as evidenced by adherence to CM Team Scheduled appointments     work with [employer] care guide to address needs related to Financial constraints related to affording utilities and Limited access to food as evidenced by patient and/or community resource care guide support    through collaboration with Consulting civil engineer, provider, and care team.   Interventions: Inter-disciplinary care team collaboration (see longitudinal plan of care) Evaluation of current treatment plan related to  self management and patient's adherence to plan as established by provider Provided patient with Care Guide contact information (Patient missed call from Care Guide on 04/07/21)   Pain:  (Status: Goal on Track (progressing): YES.) Long Term Goal  Discussed importance of adherence to all scheduled medical appointments; Counseled on the importance of reporting any/all new or changed  pain symptoms or management strategies to pain management provider; Advised patient to discuss plan for pain management with provider; Assessed social determinant of health barriers;  Provided patient with education related to pain management  Patient Goals/Self-Care  Activities: Patient will self administer medications as prescribed as evidenced by self report/primary caregiver report  Patient will attend all scheduled provider appointments as evidenced by clinician review of documented attendance to scheduled appointments and patient/caregiver report Patient will call pharmacy for medication refills as evidenced by patient report and review of pharmacy fill history as appropriate Patient will continue to perform ADL's independently as evidenced by patient/caregiver report Patient will continue to perform IADL's independently as evidenced by patient/caregiver report Patient will call provider office for new concerns or questions as evidenced by review of documented incoming telephone call notes and patient report Patient will work with Care Guide for food resources and assistance with utilities

## 2021-04-10 DIAGNOSIS — S065X9A Traumatic subdural hemorrhage with loss of consciousness of unspecified duration, initial encounter: Secondary | ICD-10-CM | POA: Diagnosis not present

## 2021-04-14 ENCOUNTER — Telehealth: Payer: Self-pay | Admitting: *Deleted

## 2021-04-14 NOTE — Telephone Encounter (Signed)
   Telephone encounter was:  Successful.  04/14/2021 Name: Elizabeth Bush MRN: [id number] DOB: 28-Dec-1970  [name] is a 50 y.o. year old female who is a primary care patient of Ladell Pier, MD . The community resource team was consulted for assistance with Food InsecurityProvided referal to delivery food banks as well as one [place] [employer] food bank   Care guide performed the following interventions: Patient provided with information about care guide support team and interviewed to confirm resource needs.  Follow Up Plan:  No further follow up planned at this time. The patient has been provided with needed resources.  [place], Care Management  [phone] [address] , [place] [address] Email : [email or web address]

## 2021-04-15 ENCOUNTER — Ambulatory Visit: Payer: Medicaid Other | Attending: Internal Medicine | Admitting: Internal Medicine

## 2021-04-15 ENCOUNTER — Other Ambulatory Visit (HOSPITAL_COMMUNITY)
Admission: RE | Admit: 2021-04-15 | Discharge: 2021-04-15 | Disposition: A | Discharge status: Home / Self Care | Payer: Medicaid Other | Source: Ambulatory Visit | Attending: Internal Medicine | Admitting: Internal Medicine

## 2021-04-15 ENCOUNTER — Other Ambulatory Visit: Payer: Self-pay

## 2021-04-15 ENCOUNTER — Encounter: Payer: Self-pay | Admitting: Internal Medicine

## 2021-04-15 VITALS — BP 180/114 | HR 97 | Resp 16 | Wt 239.4 lb

## 2021-04-15 DIAGNOSIS — Z23 Encounter for immunization: Secondary | ICD-10-CM | POA: Diagnosis not present

## 2021-04-15 DIAGNOSIS — R52 Pain, unspecified: Secondary | ICD-10-CM | POA: Diagnosis not present

## 2021-04-15 DIAGNOSIS — I10 Essential (primary) hypertension: Secondary | ICD-10-CM | POA: Diagnosis not present

## 2021-04-15 DIAGNOSIS — N898 Other specified noninflammatory disorders of vagina: Secondary | ICD-10-CM

## 2021-04-15 DIAGNOSIS — R600 Localized edema: Secondary | ICD-10-CM | POA: Diagnosis not present

## 2021-04-15 DIAGNOSIS — D500 Iron deficiency anemia secondary to blood loss (chronic): Secondary | ICD-10-CM | POA: Diagnosis not present
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
## 2021-04-16 ENCOUNTER — Other Ambulatory Visit: Payer: Self-pay | Admitting: Internal Medicine

## 2021-04-16 ENCOUNTER — Ambulatory Visit: Payer: Medicaid Other

## 2021-04-16 DIAGNOSIS — G8929 Other chronic pain: Secondary | ICD-10-CM | POA: Diagnosis not present

## 2021-04-16 DIAGNOSIS — R600 Localized edema: Secondary | ICD-10-CM | POA: Diagnosis not present

## 2021-04-16 DIAGNOSIS — R2681 Unsteadiness on feet: Secondary | ICD-10-CM | POA: Diagnosis not present

## 2021-04-16 DIAGNOSIS — M25661 Stiffness of right knee, not elsewhere classified: Secondary | ICD-10-CM | POA: Diagnosis not present

## 2021-04-16 DIAGNOSIS — M25561 Pain in right knee: Secondary | ICD-10-CM | POA: Diagnosis not present

## 2021-04-16 DIAGNOSIS — M6281 Muscle weakness (generalized): Secondary | ICD-10-CM | POA: Diagnosis not present

## 2021-04-16 DIAGNOSIS — R2689 Other abnormalities of gait and mobility: Secondary | ICD-10-CM | POA: Diagnosis not present

## 2021-04-16 LAB — CERVICOVAGINAL ANCILLARY ONLY
Bacterial Vaginitis (gardnerella): POSITIVE — AB
Candida Glabrata: NEGATIVE
Candida Vaginitis: NEGATIVE
Chlamydia: NEGATIVE
Comment: NEGATIVE
Comment: NEGATIVE
Comment: NEGATIVE
Comment: NEGATIVE
Comment: NEGATIVE
Comment: NORMAL
Neisseria Gonorrhea: NEGATIVE
Trichomonas: NEGATIVE

## 2021-04-16 MED ORDER — METRONIDAZOLE 500 MG PO TABS: 500.0000 mg | ORAL_TABLET | Freq: Two times a day (BID) | ORAL | 0 refills | Status: DC

## 2021-04-16 NOTE — Progress Notes (Signed)
Let patient know that her vaginal secretion tested positive for bacterial vaginosis.  This is not a sexually transmitted infection.  [facility] [address] for antibiotic called Flagyl to treat the infection.

## 2021-04-17 DIAGNOSIS — Z419 Encounter for procedure for purposes other than remedying health state, unspecified: Secondary | ICD-10-CM | POA: Diagnosis not present
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
## 2021-04-18 ENCOUNTER — Other Ambulatory Visit: Payer: Self-pay

## 2021-04-18 ENCOUNTER — Telehealth: Payer: Self-pay | Admitting: Internal Medicine

## 2021-04-18 ENCOUNTER — Telehealth: Payer: Self-pay

## 2021-04-18 NOTE — Telephone Encounter (Signed)
Pt was called and is aware of results, DOB was confirmed.  ?

## 2021-04-18 NOTE — Telephone Encounter (Signed)
Copied from [place] [id number]. Topic: General - Other >> Apr 18, 2021  9:27 AM Elizabeth Bush A wrote: Reason for CRM: The patient would like to be contacted by a member of staff when possible  The patient would like to receive their lab results from a recent test  Please contact further when possible

## 2021-04-18 NOTE — Telephone Encounter (Signed)
-----   Message from Carilyn Goodpasture, RN sent at 04/18/2021 11:45 AM EST -----  ----- Message ----- From: Ladell Pier, MD Sent: 04/16/2021   6:21 PM EST To: Jackelyn Knife, RMA  Let patient know that her vaginal secretion tested positive for bacterial vaginosis.  This is not a sexually transmitted infection.  [facility] [address] [facility] antibiotic called Flagyl to treat the infection.

## 2021-04-18 NOTE — Telephone Encounter (Signed)
Called pt and she is aware of results and meds.

## 2021-04-21 ENCOUNTER — Other Ambulatory Visit: Payer: Self-pay

## 2021-04-21 ENCOUNTER — Inpatient Hospital Stay (HOSPITAL_BASED_OUTPATIENT_CLINIC_OR_DEPARTMENT_OTHER): Payer: Medicaid Other | Admitting: Hematology

## 2021-04-21 ENCOUNTER — Inpatient Hospital Stay: Payer: Medicaid Other | Attending: Hematology

## 2021-04-21 VITALS — BP 138/108 | HR 88 | Temp 97.9°F | Resp 20 | Wt 237.1 lb

## 2021-04-21 DIAGNOSIS — D500 Iron deficiency anemia secondary to blood loss (chronic): Secondary | ICD-10-CM

## 2021-04-21 DIAGNOSIS — Z720 Tobacco use: Secondary | ICD-10-CM | POA: Diagnosis not present

## 2021-04-21 DIAGNOSIS — D509 Iron deficiency anemia, unspecified: Secondary | ICD-10-CM | POA: Diagnosis not present

## 2021-04-21 DIAGNOSIS — Z96653 Presence of artificial knee joint, bilateral: Secondary | ICD-10-CM | POA: Diagnosis not present

## 2021-04-21 DIAGNOSIS — M170 Bilateral primary osteoarthritis of knee: Secondary | ICD-10-CM | POA: Diagnosis not present

## 2021-04-21 DIAGNOSIS — K625 Hemorrhage of anus and rectum: Secondary | ICD-10-CM | POA: Diagnosis not present

## 2021-04-21 LAB — CBC WITH DIFFERENTIAL (CANCER CENTER ONLY)
Abs Immature Granulocytes: 0.01 10*3/uL (ref 0.00–0.07)
Basophils Absolute: 0.1 10*3/uL (ref 0.0–0.1)
Basophils Relative: 2 %
Eosinophils Absolute: 0.1 10*3/uL (ref 0.0–0.5)
Eosinophils Relative: 3 %
HCT: 33.1 % — ABNORMAL LOW (ref 36.0–46.0)
Hemoglobin: 11.4 g/dL — ABNORMAL LOW (ref 12.0–15.0)
Immature Granulocytes: 0 %
Lymphocytes Relative: 53 %
Lymphs Abs: 2.4 10*3/uL (ref 0.7–4.0)
MCH: 28.7 pg (ref 26.0–34.0)
MCHC: 34.4 g/dL (ref 30.0–36.0)
MCV: 83.4 fL (ref 80.0–100.0)
Monocytes Absolute: 0.6 10*3/uL (ref 0.1–1.0)
Monocytes Relative: 13 %
Neutro Abs: 1.3 10*3/uL — ABNORMAL LOW (ref 1.7–7.7)
Neutrophils Relative %: 29 %
Platelet Count: 259 10*3/uL (ref 150–400)
RBC: 3.97 MIL/uL (ref 3.87–5.11)
RDW: 14.4 % (ref 11.5–15.5)
WBC Count: 4.4 10*3/uL (ref 4.0–10.5)
nRBC: 0 % (ref 0.0–0.2)

## 2021-04-21 LAB — CMP (CANCER CENTER ONLY)
ALT: 42 U/L (ref 0–44)
AST: 75 U/L — ABNORMAL HIGH (ref 15–41)
Albumin: 3.6 g/dL (ref 3.5–5.0)
Alkaline Phosphatase: 126 U/L (ref 38–126)
Anion gap: 13 (ref 5–15)
BUN: 9 mg/dL (ref 6–20)
CO2: 19 mmol/L — ABNORMAL LOW (ref 22–32)
Calcium: 8.8 mg/dL — ABNORMAL LOW (ref 8.9–10.3)
Chloride: 101 mmol/L (ref 98–111)
Creatinine: 0.68 mg/dL (ref 0.44–1.00)
GFR, Estimated: >60 mL/min (ref >60)
Glucose, Bld: 93 mg/dL (ref 70–99)
Potassium: 4.2 mmol/L (ref 3.5–5.1)
Sodium: 133 mmol/L — ABNORMAL LOW (ref 135–145)
Total Bilirubin: 0.4 mg/dL (ref 0.3–1.2)
Total Protein: 8.9 g/dL — ABNORMAL HIGH (ref 6.5–8.1)

## 2021-04-21 LAB — FERRITIN: Ferritin: 59 ng/mL (ref 11–307)

## 2021-04-21 LAB — SAMPLE TO BLOOD BANK

## 2021-04-21 LAB — IRON AND TIBC
Iron: 100 ug/dL (ref 41–142)
Saturation Ratios: 26 % (ref 21–57)
TIBC: 390 ug/dL (ref 236–444)
UIBC: 290 ug/dL (ref 120–384)

## 2021-04-22 ENCOUNTER — Other Ambulatory Visit: Payer: Self-pay | Admitting: *Deleted

## 2021-04-22 NOTE — Patient Instructions (Signed)
Visit Information  Ms. Rudder was given information about Medicaid Managed Care team care coordination services as a part of their [facility] Medicaid benefit. Briar Sword Oloughlin verbally consented to engagement with the [facility] Managed Care team.   If you are experiencing a medical emergency, please call 911 or report to your local emergency department or urgent care.   If you have a non-emergency medical problem during routine business hours, please contact your provider's office and ask to speak with a nurse.   For questions related to your [facility] health plan, please call: [phone] or go here:[email or web address]  If you would like to schedule transportation through your [facility] plan, please call the following number at least 2 days in advance of your appointment: [phone].  Call the [place] at [phone], at any time, 24 hours a day, 7 days a week. If you are in danger or need immediate medical attention call 911.  If you would like help to quit smoking, call 1-800-QUIT-NOW (720) 203-3746) OR Espaol: 1-855-Djelo-Ya (9-379-024-0973) o para ms informacin haga clic aqu or Text READY to 200-400 to register via text  Ms. Towner - following are the goals we discussed in your visit today:   Goals Addressed   None     Please see education materials related to HTN, pain and bacterial vaginosis provided as print materials.   The patient verbalized understanding of instructions provided today and agreed to receive a mailed copy of patient instruction and/or educational materials.  Telephone follow up appointment with Managed Medicaid care management team member scheduled for:05/07/21 @ 12:30pm  [name], BSN [place] RN Care Coordinator   Following is a copy of your plan of care:  Care Plan : RN Care Manager Plan of Care  Updates made by [name], RN since 04/22/2021 12:00 AM      Problem: Care Coordination for managing back pain needed   Priority: High     Long-Range Goal: Develop plan of care for managing back pain and SDOH barriers   Start Date: 03/26/2021  Expected End Date: 06/24/2021  Priority: High  Note:   Current Barriers:  Knowledge Deficits related to plan of care for management of HTN and back pain  Care Coordination needs related to Financial constraints related to affording utilities and Limited access to food  Ms. Bridge is managing HTN and back pain. She has not been taking all of her medications as instructed due to not understanding what they were for. Since her PCP appointment with Dr. Wynetta Emery, she plans to be more compliant.  RNCM Clinical Goal(s):  Patient will verbalize understanding of plan for management of back pain as evidenced by scheduling a PCP appointment for evaluation and plan of care attend all scheduled medical appointments: PCP as evidenced by scheduling an appointment and attending all PT appointments        continue to work with [place] and/or Social Worker to address care management and care coordination needs related to back pain as evidenced by adherence to CM Team Scheduled appointments     work with community resource care guide to address needs related to Financial constraints related to affording utilities and Limited access to food as evidenced by patient and/or community resource care guide support    through collaboration with Consulting civil engineer, provider, and care team.   Interventions: Inter-disciplinary care team collaboration (see longitudinal plan of care) Evaluation of current treatment plan related to  self management  and patient's adherence to plan as established by provider Discussed new medication prescribed for bacterial vaginosis, instructed patient to avoid alcohol while taking this medication. Advised to start taking this medication at least 24 hours after last drink of alcohol and no alcohol until 24  hours after taking last dose. Provided education on bacterial vaginosis Advised patient to contact [facility] [phone] for member benefits to request grab bars for shower   Hypertension Interventions:  (Status:  New goal.) Long Term Goal Last practice recorded BP readings:  BP Readings from Last 3 Encounters:  04/21/21 (!) 138/108  04/15/21 (!) 180/114  01/30/21 (!) 174/85  Most recent eGFR/CrCl: No results found for: EGFR  No components found for: CRCL  Evaluation of current treatment plan related to hypertension self management and patient's adherence to plan as established by provider Provided education to patient re: stroke prevention, s/s of heart attack and stroke Reviewed medications with patient and discussed importance of compliance Provided assistance with obtaining home blood pressure monitor via [facility] [phone]; Discussed plans with patient for ongoing care management follow up and provided patient with direct contact information for care management team  Pain:  (Status: Goal on Track (progressing): YES.) Long Term Goal  Reviewed provider established plan for pain management; Counseled on the importance of reporting any/all new or changed pain symptoms or management strategies to pain management provider; Advised patient to report to care team affect of pain on daily activities; Reviewed with patient prescribed pharmacological and nonpharmacological pain relief strategies; Advised patient to discuss plan for pain management with provider; Assessed social determinant of health barriers;    Patient Goals/Self-Care Activities: Patient will self administer medications as prescribed as evidenced by self report/primary caregiver report  Patient will attend all scheduled provider appointments as evidenced by clinician review of documented attendance to scheduled appointments and patient/caregiver report Patient will call pharmacy for medication refills as evidenced by  patient report and review of pharmacy fill history as appropriate Patient will continue to perform ADL's independently as evidenced by patient/caregiver report Patient will continue to perform IADL's independently as evidenced by patient/caregiver report Patient will call provider office for new concerns or questions as evidenced by review of documented incoming telephone call notes and patient report Patient will work with Care Guide for food resources and assistance with utilities
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
## 2021-04-23 ENCOUNTER — Other Ambulatory Visit: Payer: Self-pay

## 2021-04-23 ENCOUNTER — Ambulatory Visit: Payer: Medicaid Other | Attending: Orthopaedic Surgery | Admitting: Physical Therapy

## 2021-04-23 ENCOUNTER — Encounter: Payer: Self-pay | Admitting: Physical Therapy

## 2021-04-23 ENCOUNTER — Telehealth: Payer: Self-pay | Admitting: Internal Medicine

## 2021-04-23 DIAGNOSIS — G8929 Other chronic pain: Secondary | ICD-10-CM | POA: Diagnosis not present

## 2021-04-23 DIAGNOSIS — M25561 Pain in right knee: Secondary | ICD-10-CM | POA: Insufficient documentation

## 2021-04-23 DIAGNOSIS — M6281 Muscle weakness (generalized): Secondary | ICD-10-CM | POA: Diagnosis not present

## 2021-04-23 DIAGNOSIS — R2689 Other abnormalities of gait and mobility: Secondary | ICD-10-CM | POA: Diagnosis not present

## 2021-04-23 DIAGNOSIS — M25661 Stiffness of right knee, not elsewhere classified: Secondary | ICD-10-CM | POA: Insufficient documentation
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
## 2021-04-23 NOTE — Patient Instructions (Signed)
Access Code: [email or web address] URL: [email or web address] Date: 04/23/2021 Prepared by: Hilda Blades  Exercises Supine Quad Set - 3-4 x daily - 7 x weekly - 10 reps - 5 hold Supine Calf Stretch with Strap - 3-4 x daily - 7 x weekly - 3 reps - 20 hold Supine Heel Slide with Strap - 3-4 x daily - 7 x weekly - 10 reps - 5 hold Modified Thomas Stretch - 3-4 x daily - 7 x weekly - 3 reps - 30 hold Active Straight Leg Raise with Quad Set - 1 x daily - 7 x weekly - 2 sets - 10 reps Sidelying Hip Abduction - 1 x daily - 7 x weekly - 2 sets - 10 reps Seated Long Arc Quad - 1 x daily - 7 x weekly - 2 sets - 10 reps Seated Hamstring Stretch - 3-4 x daily - 7 x weekly - 3 reps - 20 hold Squat with Counter Support - 1 x daily - 7 x weekly - 2 sets - 10 reps

## 2021-04-23 NOTE — Telephone Encounter (Signed)
Copied from [place] [phone]. Topic: General - Other >> Apr 22, 2021  1:41 PM [name], Brayton Layman A wrote: Reason for CRM: Caller from [facility] was following up on a Blood pressure cuff / monitor, caller stated she did not see any order authorizing this for the pt, please advise.

## 2021-04-23 NOTE — Telephone Encounter (Signed)
Rx for bp monitor was sent back on 01/07/21 to [place]

## 2021-04-27 ENCOUNTER — Encounter: Payer: Self-pay | Admitting: Hematology
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
## 2021-04-30 ENCOUNTER — Ambulatory Visit: Payer: Medicaid Other | Admitting: Physical Therapy

## 2021-04-30 ENCOUNTER — Other Ambulatory Visit: Payer: Self-pay

## 2021-04-30 ENCOUNTER — Encounter: Payer: Self-pay | Admitting: Physical Therapy

## 2021-04-30 DIAGNOSIS — R2689 Other abnormalities of gait and mobility: Secondary | ICD-10-CM

## 2021-04-30 DIAGNOSIS — G8929 Other chronic pain: Secondary | ICD-10-CM | POA: Diagnosis not present

## 2021-04-30 DIAGNOSIS — M6281 Muscle weakness (generalized): Secondary | ICD-10-CM

## 2021-04-30 DIAGNOSIS — M25661 Stiffness of right knee, not elsewhere classified: Secondary | ICD-10-CM

## 2021-04-30 DIAGNOSIS — M25561 Pain in right knee: Secondary | ICD-10-CM | POA: Diagnosis not present
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
## 2021-05-06 ENCOUNTER — Ambulatory Visit: Payer: Medicaid Other | Attending: Internal Medicine | Admitting: Pharmacist

## 2021-05-06 ENCOUNTER — Other Ambulatory Visit: Payer: Self-pay

## 2021-05-06 VITALS — BP 122/78

## 2021-05-06 DIAGNOSIS — R32 Unspecified urinary incontinence: Secondary | ICD-10-CM | POA: Diagnosis not present

## 2021-05-06 DIAGNOSIS — I10 Essential (primary) hypertension: Secondary | ICD-10-CM

## 2021-05-06 NOTE — Progress Notes (Signed)
° °  S:    PCP: Dr. Wynetta Emery  Patient arrives in good spirits. Presents to the clinic for BP check.  Patient was referred and last seen by Primary Care Provider on 04/15/2021. BP at that visit was 180/114 mmHg. It was found that she was not taking her amlodipine on a consistent basis. No changes were made to her medications. Of note, she was seen by Dr. Irene Limbo on 04/21/2021 and her BP was 138/108.   Patient reports adherence with medication.  Current BP Medications include:  Amlodipine 10 mg daily  Dietary habits include: non-compliant with salt restriction; denies drinking caffeine  Exercise habits include: limited d/t knee pain Family / Social history:  - FHx: DM, HTN - Tobacco: current someday smoker - Alcohol: none since visit with Dr. Billie Ruddy:  Vitals:   05/06/21 1050  BP: 122/78   Home BP readings: none (no cuff at home)  Last 3 Office BP readings: BP Readings from Last 3 Encounters:  05/06/21 122/78  04/21/21 (!) 138/108  04/15/21 (!) 180/114    BMET    Component Value Date/Time   NA 133 (L) 04/21/2021 1350   NA 132 (L) 09/15/2019 0909   K 4.2 04/21/2021 1350   CL 101 04/21/2021 1350   CO2 19 (L) 04/21/2021 1350   GLUCOSE 93 04/21/2021 1350   BUN 9 04/21/2021 1350   BUN 6 09/15/2019 0909   CREATININE 0.68 04/21/2021 1350   CALCIUM 8.8 (L) 04/21/2021 1350   GFRNONAA >60 04/21/2021 1350   GFRAA 122 09/15/2019 0909    Renal function: CrCl cannot be calculated (Unknown ideal weight.).  Clinical ASCVD: No  The 10-year ASCVD risk score (Arnett DK, et al., 2019) is: 5.1%   Values used to calculate the score:     Age: 89 years     Sex: Female     Is Non-Hispanic African American: Yes     Diabetic: No     Tobacco smoker: Yes     Systolic Blood Pressure: 381 mmHg     Is BP treated: Yes     HDL Cholesterol: 60 mg/dL     Total Cholesterol: 189 mg/dL   A/P: Hypertension longstanding currently at goal on current medications. BP Goal = <130/80 mmHg. Patient  reports medication adherence and has fully abstained from any alcohol use since her last PCP visit. Commended her for this. -Continued current regimen. -Counseled on lifestyle modifications for blood pressure control including reduced dietary sodium, increased exercise, adequate sleep  Results reviewed and written information provided.   Total time in face-to-face counseling 15 minutes.   F/U Clinic Visit with PCP.   Benard Halsted, PharmD, [place], [place] [phone]

## 2021-05-07 ENCOUNTER — Ambulatory Visit: Payer: Medicaid Other | Admitting: Physical Therapy

## 2021-05-07 ENCOUNTER — Encounter: Payer: Self-pay | Admitting: Physical Therapy

## 2021-05-07 ENCOUNTER — Other Ambulatory Visit: Payer: Self-pay

## 2021-05-07 ENCOUNTER — Other Ambulatory Visit: Payer: Self-pay | Admitting: *Deleted

## 2021-05-07 DIAGNOSIS — R2689 Other abnormalities of gait and mobility: Secondary | ICD-10-CM | POA: Diagnosis not present

## 2021-05-07 DIAGNOSIS — G8929 Other chronic pain: Secondary | ICD-10-CM

## 2021-05-07 DIAGNOSIS — M25661 Stiffness of right knee, not elsewhere classified: Secondary | ICD-10-CM

## 2021-05-07 DIAGNOSIS — M25561 Pain in right knee: Secondary | ICD-10-CM | POA: Diagnosis not present

## 2021-05-07 DIAGNOSIS — M6281 Muscle weakness (generalized): Secondary | ICD-10-CM | POA: Diagnosis not present
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
## 2021-05-07 NOTE — Patient Instructions (Signed)
Visit Information  [name] was given information about Medicaid Managed Care team care coordination services as a part of their [facility] Medicaid benefit. [name] verbally consented to engagement with the [facility] Managed Care team.   If you are experiencing a medical emergency, please call 911 or report to your local emergency department or urgent care.   If you have a non-emergency medical problem during routine business hours, please contact your provider's office and ask to speak with a nurse.   For questions related to your [facility] health plan, please call: [phone] or go here:[email or web address]  If you would like to schedule transportation through your [facility] plan, please call the following number at least 2 days in advance of your appointment: [phone].  Call the [place] at [phone], at any time, 24 hours a day, 7 days a week. If you are in danger or need immediate medical attention call 911.  If you would like help to quit smoking, call 1-800-QUIT-NOW 725-053-4298) OR Espaol: 1-855-Djelo-Ya (7-824-235-3614) o para ms informacin haga clic aqu or Text READY to 200-400 to register via text  [name] - following are the goals we discussed in your visit today:   Goals Addressed   None     Please see education materials related to alcohol cessation provided by MyChart link. and as [employer] account planner.   The patient verbalized understanding of instructions provided today and agreed to receive a mailed copy of patient instruction and/or educational materials.  Telephone follow up appointment with Managed Medicaid care management team member scheduled for:05/23/21 @ 12:30pm  [name], BSN [place] RN Care Coordinator   Following is a copy of your plan of care:  Care Plan : RN Care Manager Plan of Care  Updates made by [name], RN since 05/07/2021 12:00 AM      Problem: Care Coordination for managing back pain needed   Priority: High     Long-Range Goal: Develop plan of care for managing back pain and SDOH barriers   Start Date: 03/26/2021  Expected End Date: 06/24/2021  Priority: High  Note:   Current Barriers:  Knowledge Deficits related to plan of care for management of HTN and back pain  Care Coordination needs related to Financial constraints related to affording utilities and Limited access to food  [name] is managing HTN and back pain. [name] has not had any alcohol since our last visit. She is taking her medications and is feeling much better. "I am doing things that I haven't done in 2 years."  RNCM Clinical Goal(s):  Patient will verbalize understanding of plan for management of back pain as evidenced by scheduling a PCP appointment for evaluation and plan of care attend all scheduled medical appointments: PCP as evidenced by scheduling an appointment and attending all PT appointments        continue to work with [place] and/or Social Worker to address care management and care coordination needs related to back pain as evidenced by adherence to CM Team Scheduled appointments     work with community resource care guide to address needs related to Financial constraints related to affording utilities and Limited access to food as evidenced by patient and/or community resource care guide support    through collaboration with Consulting civil engineer, provider, and care team.   Interventions: Inter-disciplinary care team collaboration (see longitudinal plan of care) Evaluation of current treatment plan related to  self management and patient's adherence to plan as established by provider Congratulated patient on quitting drinking Offered LCSW referral for assistance with quitting drinking-patient declined and feels that she is ready and can do this on her own   Hypertension Interventions:  (Status:  Goal on track:  Yes.) Long Term  Goal Last practice recorded BP readings:  BP Readings from Last 3 Encounters:  05/06/21 122/78  04/21/21 (!) 138/108  04/15/21 (!) 180/114  Most recent eGFR/CrCl: No results found for: EGFR  No components found for: CRCL  Reviewed medications with patient and discussed importance of compliance Counseled on adverse effects of illicit drug and excessive alcohol use in patients with high blood pressure  Counseled on the importance of exercise goals with target of 150 minutes per week Discussed plans with patient for ongoing care management follow up and provided patient with direct contact information for care management team  Pain:  (Status: Goal on Track (progressing): YES.) Long Term Goal  Discussed importance of adherence to all scheduled medical appointments; Counseled on the importance of reporting any/all new or changed pain symptoms or management strategies to pain management provider; Advised patient to report to care team affect of pain on daily activities; Reviewed with patient prescribed pharmacological and nonpharmacological pain relief strategies; Assessed social determinant of health barriers;   Patient Goals/Self-Care Activities: Patient will self administer medications as prescribed as evidenced by self report/primary caregiver report  Patient will attend all scheduled provider appointments as evidenced by clinician review of documented attendance to scheduled appointments and patient/caregiver report Patient will call pharmacy for medication refills as evidenced by patient report and review of pharmacy fill history as appropriate Patient will continue to perform ADL's independently as evidenced by patient/caregiver report Patient will continue to perform IADL's independently as evidenced by patient/caregiver report Patient will call provider office for new concerns or questions as evidenced by review of documented incoming telephone call notes and patient report Patient will  work with Care Guide for food resources and assistance with utilities

## 2021-05-08 ENCOUNTER — Telehealth: Payer: Self-pay | Admitting: Orthopaedic Surgery

## 2021-05-08 ENCOUNTER — Other Ambulatory Visit: Payer: Self-pay | Admitting: Physician Assistant

## 2021-05-08 MED ORDER — DOCUSATE SODIUM 100 MG PO CAPS: 100.0000 mg | ORAL_CAPSULE | Freq: Two times a day (BID) | ORAL | 0 refills | Status: DC

## 2021-05-08 NOTE — Telephone Encounter (Signed)
Lauren (pharmacy Tech) called requesting a refill of docusate sodium. Pharmacy states that script from Dr. Louanne Skye but I don't see that pt seen and was seen by Dr. Ninfa Linden. Please call [phone]

## 2021-05-10 DIAGNOSIS — S065X9A Traumatic subdural hemorrhage with loss of consciousness of unspecified duration, initial encounter: Secondary | ICD-10-CM | POA: Diagnosis not present

## 2021-05-14 ENCOUNTER — Encounter: Payer: Self-pay | Admitting: Physical Therapy

## 2021-05-14 ENCOUNTER — Ambulatory Visit: Payer: Medicaid Other | Admitting: Physical Therapy

## 2021-05-14 ENCOUNTER — Other Ambulatory Visit: Payer: Self-pay

## 2021-05-14 DIAGNOSIS — R2689 Other abnormalities of gait and mobility: Secondary | ICD-10-CM

## 2021-05-14 DIAGNOSIS — G8929 Other chronic pain: Secondary | ICD-10-CM

## 2021-05-14 DIAGNOSIS — M6281 Muscle weakness (generalized): Secondary | ICD-10-CM

## 2021-05-14 DIAGNOSIS — M25561 Pain in right knee: Secondary | ICD-10-CM | POA: Diagnosis not present

## 2021-05-14 DIAGNOSIS — M25661 Stiffness of right knee, not elsewhere classified: Secondary | ICD-10-CM | POA: Diagnosis not present

## 2021-05-14 NOTE — Patient Instructions (Signed)
Access Code: [email or web address] URL: [email or web address] Date: 05/14/2021 Prepared by: Hilda Blades  Exercises Supine Calf Stretch with Strap - 3-4 x daily - 7 x weekly - 3 reps - 20 hold Supine Heel Slide with Strap - 3-4 x daily - 7 x weekly - 10 reps - 5 hold Modified Thomas Stretch - 3-4 x daily - 7 x weekly - 3 reps - 30 hold Active Straight Leg Raise with Quad Set - 1 x daily - 7 x weekly - 2 sets - 10 reps Bridge - 1 x daily - 7 x weekly - 2 sets - 10 reps Sidelying Hip Abduction - 1 x daily - 7 x weekly - 2 sets - 10 reps Seated Long Arc Quad - 1 x daily - 7 x weekly - 2 sets - 10 reps Seated Hamstring Stretch - 3-4 x daily - 7 x weekly - 3 reps - 20 hold Squat with Counter Support - 1 x daily - 7 x weekly - 2 sets - 10 reps Standing Tandem Balance with Counter Support - 1 x daily - 7 x weekly - 3 reps - 30 hold
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
## 2021-05-15 NOTE — Telephone Encounter (Signed)
done

## 2021-05-18 DIAGNOSIS — Z419 Encounter for procedure for purposes other than remedying health state, unspecified: Secondary | ICD-10-CM | POA: Diagnosis not present

## 2021-05-22 ENCOUNTER — Other Ambulatory Visit: Payer: Self-pay

## 2021-05-22 ENCOUNTER — Other Ambulatory Visit: Payer: Self-pay | Admitting: *Deleted

## 2021-05-22 NOTE — Patient Instructions (Signed)
Visit Information  Elizabeth Bush was given information about Medicaid Managed Care team care coordination services as a part of their [facility] Medicaid benefit. Elizabeth Bush verbally consented to engagement with the [facility] Managed Care team.   If you are experiencing a medical emergency, please call 911 or report to your local emergency department or urgent care.   If you have a non-emergency medical problem during routine business hours, please contact your provider's office and ask to speak with a nurse.   For questions related to your [facility] health plan, please call: [phone] or go here:[email or web address]  If you would like to schedule transportation through your [facility] plan, please call the following number at least 2 days in advance of your appointment: [phone].  Call the [place] at [phone], at any time, 24 hours a day, 7 days a week. If you are in danger or need immediate medical attention call 911.  If you would like help to quit smoking, call 1-800-QUIT-NOW 312-444-3075) OR Espaol: 1-855-Djelo-Ya (3-704-888-9169) o para ms informacin haga clic aqu or Text READY to 200-400 to register via text  Elizabeth Bush,   Please see education materials related to alcohol use provided by MyChart link. and as Advertising account planner.   The patient verbalized understanding of instructions provided today and agreed to receive a mailed copy of patient instruction and/or educational materials.  Telephone follow up appointment with Managed Medicaid care management team member scheduled for:06/05/21 @ 12:30pm  Elizabeth Joiner RN, BSN [place] RN Care Coordinator   Following is a copy of your plan of care:  Care Plan : RN Care Manager Plan of Care  Updates made by [name], RN since 05/22/2021 12:00 AM     Problem: Care Coordination for managing back pain needed   Priority: High     Long-Range  Goal: Develop plan of care for managing back pain and SDOH barriers   Start Date: 03/26/2021  Expected End Date: 06/24/2021  Priority: High  Note:   Current Barriers:  Knowledge Deficits related to plan of care for management of HTN and back pain  Care Coordination needs related to Financial constraints related to affording utilities and Limited access to food  Elizabeth Bush continues to work on alcohol cessation. She did backslide over the holidays. She declines any help at this time.  RNCM Clinical Goal(s):  Patient will verbalize understanding of plan for management of back pain as evidenced by scheduling a PCP appointment for evaluation and plan of care attend all scheduled medical appointments: PCP as evidenced by scheduling an appointment and attending all PT appointments        continue to work with [place] and/or Social Worker to address care management and care coordination needs related to back pain as evidenced by adherence to CM Team Scheduled appointments     work with community resource care guide to address needs related to Financial constraints related to affording utilities and Limited access to food as evidenced by patient and/or community resource care guide support    through collaboration with Consulting civil engineer, provider, and care team.   Interventions: Inter-disciplinary care team collaboration (see longitudinal plan of care) Evaluation of current treatment plan related to  self management and patient's adherence to plan as established by provider Congratulated patient on efforts to quit drinking Provided therapeutic listening Reviewed upcoming appointment on 08/12/21 with Dr. Wynetta Emery   Hypertension Interventions:  (Status:  Goal on track:  Yes.) Long Term Goal Last practice recorded BP readings:  BP Readings from Last 3 Encounters:  05/06/21 122/78  04/21/21 (!) 138/108  04/15/21 (!) 180/114  Most recent eGFR/CrCl: No results found for: EGFR  No components found  for: CRCL  Reviewed medications with patient and discussed importance of compliance Counseled on adverse effects of illicit drug and excessive alcohol use in patients with high blood pressure  Counseled on the importance of exercise goals with target of 150 minutes per week Discussed plans with patient for ongoing care management follow up and provided patient with direct contact information for care management team  Pain:  (Status: Goal on Track (progressing): YES.) Long Term Goal  Discussed importance of adherence to all scheduled medical appointments; Counseled on the importance of reporting any/all new or changed pain symptoms or management strategies to pain management provider; Advised patient to report to care team affect of pain on daily activities; Reviewed with patient prescribed pharmacological and nonpharmacological pain relief strategies; Assessed social determinant of health barriers;   Patient Goals/Self-Care Activities: Patient will self administer medications as prescribed as evidenced by self report/primary caregiver report  Patient will attend all scheduled provider appointments as evidenced by clinician review of documented attendance to scheduled appointments and patient/caregiver report Patient will call pharmacy for medication refills as evidenced by patient report and review of pharmacy fill history as appropriate Patient will continue to perform ADL's independently as evidenced by patient/caregiver report Patient will continue to perform IADL's independently as evidenced by patient/caregiver report Patient will call provider office for new concerns or questions as evidenced by review of documented incoming telephone call notes and patient report Patient will work with Care Guide for food resources and assistance with utilities
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
## 2021-05-23 ENCOUNTER — Ambulatory Visit: Payer: Medicaid Other

## 2021-06-03 ENCOUNTER — Telehealth: Payer: Self-pay | Admitting: Internal Medicine

## 2021-06-03 NOTE — Telephone Encounter (Signed)
Copied from [place] [id number]. Topic: General - Other >> Jun 03, 2021  3:32 PM Leward Quan A wrote: Reason for CRM: Patient need to be set up with transportation for her visit on 06/05/21. Please advise and call patient at Ph# [phone]

## 2021-06-05 ENCOUNTER — Other Ambulatory Visit (HOSPITAL_COMMUNITY)
Admission: RE | Admit: 2021-06-05 | Discharge: 2021-06-05 | Disposition: A | Discharge status: Home / Self Care | Payer: Medicaid Other | Source: Ambulatory Visit | Attending: Physician Assistant | Admitting: Physician Assistant

## 2021-06-05 ENCOUNTER — Other Ambulatory Visit: Payer: Self-pay

## 2021-06-05 ENCOUNTER — Encounter: Payer: Self-pay | Admitting: Physician Assistant

## 2021-06-05 ENCOUNTER — Ambulatory Visit: Payer: Medicaid Other | Attending: Physician Assistant | Admitting: Physician Assistant

## 2021-06-05 ENCOUNTER — Other Ambulatory Visit: Payer: Self-pay | Admitting: *Deleted

## 2021-06-05 VITALS — BP 143/96 | Resp 16 | Wt 230.6 lb

## 2021-06-05 DIAGNOSIS — R0789 Other chest pain: Secondary | ICD-10-CM

## 2021-06-05 DIAGNOSIS — R9431 Abnormal electrocardiogram [ECG] [EKG]: Secondary | ICD-10-CM | POA: Diagnosis not present

## 2021-06-05 DIAGNOSIS — R202 Paresthesia of skin: Secondary | ICD-10-CM | POA: Diagnosis not present

## 2021-06-05 DIAGNOSIS — I10 Essential (primary) hypertension: Secondary | ICD-10-CM | POA: Insufficient documentation

## 2021-06-05 DIAGNOSIS — Z7982 Long term (current) use of aspirin: Secondary | ICD-10-CM | POA: Insufficient documentation

## 2021-06-05 DIAGNOSIS — N898 Other specified noninflammatory disorders of vagina: Secondary | ICD-10-CM | POA: Insufficient documentation

## 2021-06-05 DIAGNOSIS — Z7182 Exercise counseling: Secondary | ICD-10-CM | POA: Insufficient documentation

## 2021-06-05 DIAGNOSIS — Z713 Dietary counseling and surveillance: Secondary | ICD-10-CM | POA: Insufficient documentation

## 2021-06-05 DIAGNOSIS — D500 Iron deficiency anemia secondary to blood loss (chronic): Secondary | ICD-10-CM | POA: Insufficient documentation

## 2021-06-05 DIAGNOSIS — Z79899 Other long term (current) drug therapy: Secondary | ICD-10-CM | POA: Diagnosis not present

## 2021-06-05 DIAGNOSIS — E871 Hypo-osmolality and hyponatremia: Secondary | ICD-10-CM | POA: Insufficient documentation

## 2021-06-05 MED ORDER — ASPIRIN 81 MG PO CHEW: 81.0000 mg | CHEWABLE_TABLET | Freq: Two times a day (BID) | ORAL | 2 refills | Status: DC

## 2021-06-05 NOTE — Patient Instructions (Addendum)
[place]org is the website for narcotics anonymous  [place] (website) or 321-417-4835 is the information for alcoholics anonymous  Both are free and immediately available for help with alcohol and drug use Chest Wall Pain Chest wall pain is pain in or around the bones and muscles of your chest. Sometimes, an injury causes this pain. Excessive coughing or overuse of arm and chest muscles may also cause chest wall pain. Sometimes, the cause may not be known. This pain may take several weeks or longer to get better. Follow these instructions at home: Managing pain, stiffness, and swelling  If directed, put ice on the painful area: Put ice in a plastic bag. Place a towel between your skin and the bag. Leave the ice on for 20 minutes, 2-3 times per day. Activity Rest as told by your health care provider. Avoid activities that cause pain. These include any activities that use your chest muscles or your abdominal and side muscles to lift heavy items. Ask your health care provider what activities are safe for you. General instructions  Take over-the-counter and prescription medicines only as told by your health care provider. Do not use any products that contain nicotine or tobacco, such as cigarettes, e-cigarettes, and chewing tobacco. These can delay healing after injury. If you need help quitting, ask your health care provider. Keep all follow-up visits as told by your health care provider. This is important. Contact a health care provider if: You have a fever. Your chest pain becomes worse. You have new symptoms. Get help right away if: You have nausea or vomiting. You feel sweaty or light-headed. You have a cough with mucus from your lungs (sputum) or you cough up blood. You develop shortness of breath. These symptoms may represent a serious problem that is an emergency. Do not wait to see if the symptoms will go away. Get medical help right away. Call your local emergency services  (911 in the [place] Do not drive yourself to the hospital. Summary Chest wall pain is pain in or around the bones and muscles of your chest. Depending on the cause, it may be treated with ice, rest, medicines, and avoiding activities that cause pain. Contact a health care provider if you have a fever, worsening chest pain, or new symptoms. Get help right away if you feel light-headed or you develop shortness of breath. These symptoms may be an emergency. This information is not intended to replace advice given to you by your health care provider. Make sure you discuss any questions you have with your health care provider. Document Revised: 07/19/2020 Document Reviewed: 07/19/2020 Elsevier Patient Education  [place].
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
## 2021-06-05 NOTE — Patient Instructions (Signed)
Visit Information  Elizabeth Bush was given information about Medicaid Managed Care team care coordination services as a part of their [facility] Medicaid benefit. Elizabeth Bush verbally consented to engagement with the [facility] Managed Care team.   If you are experiencing a medical emergency, please call 911 or report to your local emergency department or urgent care.   If you have a non-emergency medical problem during routine business hours, please contact your provider's office and ask to speak with a nurse.   For questions related to your [facility] health plan, please call: [phone] or go here:[email or web address]  If you would like to schedule transportation through your [facility] plan, please call the following number at least 2 days in advance of your appointment: [phone].  Call the [place] at [phone], at any time, 24 hours a day, 7 days a week. If you are in danger or need immediate medical attention call 911.  If you would like help to quit smoking, call 1-800-QUIT-NOW 959-416-9880) OR Espaol: 1-855-Djelo-Ya (7-062-376-2831) o para ms informacin haga clic aqu or Text READY to 200-400 to register via text  Elizabeth Bush,   Please see education materials related to HTN provided as print materials.   The patient verbalized understanding of instructions provided today and agreed to receive a mailed copy of patient instruction and/or educational materials.  Telephone follow up appointment with Managed Medicaid care management team member scheduled for:07/07/21 @ 1:15pm  [name], BSN [place] RN Care Coordinator   Following is a copy of your plan of care:  Care Plan : RN Care Manager Plan of Care  Updates made by [name], RN since 06/05/2021 12:00 AM     Problem: Care Coordination for managing back pain needed   Priority: High     Long-Range Goal: Develop plan of care for  managing back pain and SDOH barriers   Start Date: 03/26/2021  Expected End Date: 06/24/2021  Priority: High  Note:   Current Barriers:  Knowledge Deficits related to plan of care for management of HTN and back pain  Care Coordination needs related to Financial constraints related to affording utilities and Limited access to food  Elizabeth Bush continues to work on alcohol cessation. She denies any alcohol since the new year. She reports vaginal odor has returned since completing the prescribed medication for bacterial vaginosis. BP at home today 135/87.  RNCM Clinical Goal(s):  Patient will verbalize understanding of plan for management of back pain as evidenced by scheduling a PCP appointment for evaluation and plan of care attend all scheduled medical appointments: PCP as evidenced by 06/05/21 and 08/12/21 with PCP        continue to work with [place] and/or Social Worker to address care management and care coordination needs related to back pain as evidenced by adherence to CM Team Scheduled appointments     work with [employer] care guide to address needs related to Financial constraints related to affording utilities and Limited access to food as evidenced by patient and/or community resource care guide support    through collaboration with Consulting civil engineer, provider, and care team.   Interventions: Inter-disciplinary care team collaboration (see longitudinal plan of care) Evaluation of current treatment plan related to  self management and patient's adherence to plan as established by provider Congratulated patient on efforts to quit drinking Provided therapeutic listening Provided information for transportation provided by [facility] and discussed details on how  to arrange transportation   Hypertension Interventions:  (Status:  Goal on track:  Yes.) Long Term Goal Last practice recorded BP readings:  BP Readings from Last 3 Encounters:  05/06/21 122/78  04/21/21 (!) 138/108   04/15/21 (!) 180/114  Most recent eGFR/CrCl: No results found for: EGFR  No components found for: CRCL  Provided education to patient re: stroke prevention, s/s of heart attack and stroke Reviewed medications with patient and discussed importance of compliance Counseled on the importance of exercise goals with target of 150 minutes per week Advised patient, providing education and rationale, to monitor blood pressure daily and record, calling PCP for findings outside established parameters Assessed social determinant of health barriers  Pain:  (Status: Goal Met.) Long Term Goal  Discussed importance of adherence to all scheduled medical appointments; Counseled on the importance of reporting any/all new or changed pain symptoms or management strategies to pain management provider; Advised patient to report to care team affect of pain on daily activities; Reviewed with patient prescribed pharmacological and nonpharmacological pain relief strategies; Assessed social determinant of health barriers;   Patient Goals/Self-Care Activities: Patient will self administer medications as prescribed as evidenced by self report/primary caregiver report  Patient will attend all scheduled provider appointments as evidenced by clinician review of documented attendance to scheduled appointments and patient/caregiver report Patient will call pharmacy for medication refills as evidenced by patient report and review of pharmacy fill history as appropriate Patient will continue to perform ADL's independently as evidenced by patient/caregiver report Patient will continue to perform IADL's independently as evidenced by patient/caregiver report Patient will call provider office for new concerns or questions as evidenced by review of documented incoming telephone call notes and patient report Patient will work with Care Guide for food resources and assistance with utilities

## 2021-06-09 ENCOUNTER — Telehealth: Payer: Self-pay | Admitting: Internal Medicine

## 2021-06-09 LAB — CERVICOVAGINAL ANCILLARY ONLY
Bacterial Vaginitis (gardnerella): POSITIVE — AB
Candida Glabrata: NEGATIVE
Candida Vaginitis: POSITIVE — AB
Chlamydia: NEGATIVE
Comment: NEGATIVE
Comment: NEGATIVE
Comment: NEGATIVE
Comment: NEGATIVE
Comment: NEGATIVE
Comment: NORMAL
Neisseria Gonorrhea: NEGATIVE
Trichomonas: NEGATIVE

## 2021-06-09 MED ORDER — METRONIDAZOLE 500 MG PO TABS: 500.0000 mg | ORAL_TABLET | Freq: Two times a day (BID) | ORAL | 0 refills | Status: DC

## 2021-06-09 MED ORDER — FLUCONAZOLE 150 MG PO TABS: 150.0000 mg | ORAL_TABLET | Freq: Every day | ORAL | 0 refills | Status: DC

## 2021-06-09 NOTE — Telephone Encounter (Signed)
Copied from [place] [phone]. Topic: General - Other >> Jun 09, 2021  3:17 PM Leward Quan A wrote: Reason for CRM: Patient called in stated that she was not able to view her test results on My Chart asking if a nurse can call her back to go over them with her please ASAP.  Ph# [phone]

## 2021-06-11 NOTE — Telephone Encounter (Signed)
Contacted pt to go over results pt is aware. Pt states she had picked up rxs

## 2021-06-16 ENCOUNTER — Other Ambulatory Visit: Payer: Self-pay | Admitting: Physician Assistant

## 2021-06-16 ENCOUNTER — Telehealth: Payer: Self-pay | Admitting: Orthopaedic Surgery

## 2021-06-16 MED ORDER — TRAMADOL HCL 50 MG PO TABS: 50.0000 mg | ORAL_TABLET | Freq: Four times a day (QID) | ORAL | 0 refills | Status: DC | PRN

## 2021-06-16 NOTE — Telephone Encounter (Signed)
Pt has an upcoming appt on 06/23/21 with Artis Delay for hip and knee pain. Pt asking if it is possible to have any pain medication sent in to help her until her upcoming appt. The best call back number is [phone].

## 2021-06-18 DIAGNOSIS — Z419 Encounter for procedure for purposes other than remedying health state, unspecified: Secondary | ICD-10-CM | POA: Diagnosis not present

## 2021-06-20 DIAGNOSIS — R32 Unspecified urinary incontinence: Secondary | ICD-10-CM | POA: Diagnosis not present

## 2021-06-22 ENCOUNTER — Telehealth: Payer: Self-pay | Admitting: Internal Medicine

## 2021-06-23 ENCOUNTER — Other Ambulatory Visit: Payer: Self-pay

## 2021-06-23 ENCOUNTER — Ambulatory Visit: Payer: Medicaid Other | Admitting: Physician Assistant

## 2021-06-23 DIAGNOSIS — I10 Essential (primary) hypertension: Secondary | ICD-10-CM

## 2021-06-23 NOTE — Telephone Encounter (Signed)
Will forward to provider  

## 2021-06-23 NOTE — Telephone Encounter (Signed)
Rx 02/06/21 #90 1 RF- too soon Requested Prescriptions  Pending Prescriptions Disp Refills   celecoxib (CELEBREX) 200 MG capsule [Pharmacy Med Name: celecoxib 200 mg capsule] 90 capsule 1    Sig: TAKE ONE CAPSULE BY MOUTH ONCE DAILY     Analgesics:  COX2 Inhibitors Failed - 06/22/2021  8:03 AM      Failed - Manual Review: Labs are only required if the patient has taken medication for more than 8 weeks.      Failed - HGB in normal range and within 360 days    Hemoglobin  Date Value Ref Range Status  04/21/2021 11.4 (L) 12.0 - 15.0 g/dL Final  02/06/2020 8.9 (L) 11.1 - 15.9 g/dL Final         Failed - HCT in normal range and within 360 days    HCT  Date Value Ref Range Status  04/21/2021 33.1 (L) 36.0 - 46.0 % Final   Hematocrit  Date Value Ref Range Status  02/06/2020 27.6 (L) 34.0 - 46.6 % Final         Failed - AST in normal range and within 360 days    AST  Date Value Ref Range Status  04/21/2021 75 (H) 15 - 41 U/L Final         Passed - Cr in normal range and within 360 days    Creatinine  Date Value Ref Range Status  04/21/2021 0.68 0.44 - 1.00 mg/dL Final         Passed - ALT in normal range and within 360 days    ALT  Date Value Ref Range Status  04/21/2021 42 0 - 44 U/L Final         Passed - eGFR is 30 or above and within 360 days    GFR calc Af Amer  Date Value Ref Range Status  09/15/2019 122 >59 mL/min/1.73 Final    Comment:    **[facility] currently reports eGFR in compliance with the current**   recommendations of the [employer]. [facility] will   update reporting as new guidelines are published from the NKF-ASN   Task force.    GFR, Estimated  Date Value Ref Range Status  04/21/2021 >60 >60 mL/min Final    Comment:    (NOTE) Calculated using the CKD-EPI Creatinine Equation (2021)    GFR  Date Value Ref Range Status  06/13/2020 104.73 >60.00 mL/min Final    Comment:    Calculated using the CKD-EPI Creatinine Equation (2021)          Passed - Patient is not pregnant      Passed - Valid encounter within last 12 months    Recent Outpatient Visits          2 weeks ago Essential hypertension   [place], [place]   1 month ago Essential hypertension   [place], Jarome Matin, RPH-CPP   2 months ago Essential hypertension   [place], [place]   2 months ago Generalized body aches   [place], MD   5 months ago Essential hypertension   [place], [place]      Future Appointments            In 1 week Werner Lean, MD [place], [facility]  In 2 weeks [name], PA-C [place]   In 1 month Ladell Pier, MD [place]

## 2021-06-24 NOTE — Telephone Encounter (Signed)
Returned pt call and made aware

## 2021-06-25 ENCOUNTER — Other Ambulatory Visit: Payer: Self-pay | Admitting: Internal Medicine

## 2021-06-25 NOTE — Telephone Encounter (Signed)
Requested medication (s) are due for refill today: Celebrex - no  Cymbalta - yes  Requested medication (s) are on the active medication list: Yes  Last refill:  Cymbalta - 04/15/21   Historical Provider.  Future visit scheduled: No  Notes to clinic:  Cymbalta - historical provider.    Requested Prescriptions  Pending Prescriptions Disp Refills   celecoxib (CELEBREX) 200 MG capsule [Pharmacy Med Name: celecoxib 200 mg capsule] 90 capsule 1    Sig: TAKE ONE CAPSULE BY MOUTH ONCE DAILY     Analgesics:  COX2 Inhibitors Failed - 06/25/2021  9:04 AM      Failed - Manual Review: Labs are only required if the patient has taken medication for more than 8 weeks.      Failed - HGB in normal range and within 360 days    Hemoglobin  Date Value Ref Range Status  04/21/2021 11.4 (L) 12.0 - 15.0 g/dL Final  02/06/2020 8.9 (L) 11.1 - 15.9 g/dL Final          Failed - HCT in normal range and within 360 days    HCT  Date Value Ref Range Status  04/21/2021 33.1 (L) 36.0 - 46.0 % Final   Hematocrit  Date Value Ref Range Status  02/06/2020 27.6 (L) 34.0 - 46.6 % Final          Failed - AST in normal range and within 360 days    AST  Date Value Ref Range Status  04/21/2021 75 (H) 15 - 41 U/L Final          Passed - Cr in normal range and within 360 days    Creatinine  Date Value Ref Range Status  04/21/2021 0.68 0.44 - 1.00 mg/dL Final          Passed - ALT in normal range and within 360 days    ALT  Date Value Ref Range Status  04/21/2021 42 0 - 44 U/L Final          Passed - eGFR is 30 or above and within 360 days    GFR calc Af Amer  Date Value Ref Range Status  09/15/2019 122 >59 mL/min/1.73 Final    Comment:    **[facility] currently reports eGFR in compliance with the current**   recommendations of the [employer]. [facility] will   update reporting as new guidelines are published from the NKF-ASN   Task force.    GFR, Estimated  Date Value Ref Range  Status  04/21/2021 >60 >60 mL/min Final    Comment:    (NOTE) Calculated using the CKD-EPI Creatinine Equation (2021)    GFR  Date Value Ref Range Status  06/13/2020 104.73 >60.00 mL/min Final    Comment:    Calculated using the CKD-EPI Creatinine Equation (2021)          Passed - Patient is not pregnant      Passed - Valid encounter within last 12 months    Recent Outpatient Visits           2 weeks ago Essential hypertension   [place], [place]   1 month ago Essential hypertension   [place], Jarome Matin, RPH-CPP   2 months ago Essential hypertension   [place], MD   2 months ago Generalized body aches   [place] Karle Plumber  B, MD   5 months ago Essential hypertension   [place], MD       Future Appointments             In 6 days Werner Lean, MD [place], [facility]   In 2 weeks Carlis Abbott, Gillermo Murdoch, PA-C [place]   In 1 month Ladell Pier, MD [place]             DULoxetine (CYMBALTA) 30 MG capsule [place] Med Name: duloxetine 30 mg capsule,delayed release] 90 capsule 1    Sig: TAKE ONE CAPSULE BY MOUTH EVERY MORNING     Psychiatry: Antidepressants - SNRI - duloxetine Failed - 06/25/2021  9:04 AM      Failed - Last BP in normal range    BP Readings from Last 1 Encounters:  06/05/21 (!) 143/96          Passed - Cr in normal range and within 360 days    Creatinine  Date Value Ref Range Status  04/21/2021 0.68 0.44 - 1.00 mg/dL Final          Passed - eGFR is 30 or above and within 360 days    GFR calc Af Amer  Date Value Ref Range Status  09/15/2019 122 >59 mL/min/1.73 Final    Comment:    **[facility] currently reports eGFR in  compliance with the current**   recommendations of the [employer]. [facility] will   update reporting as new guidelines are published from the NKF-ASN   Task force.    GFR, Estimated  Date Value Ref Range Status  04/21/2021 >60 >60 mL/min Final    Comment:    (NOTE) Calculated using the CKD-EPI Creatinine Equation (2021)    GFR  Date Value Ref Range Status  06/13/2020 104.73 >60.00 mL/min Final    Comment:    Calculated using the CKD-EPI Creatinine Equation (2021)          Passed - Completed PHQ-2 or PHQ-9 in the last 360 days      Passed - Valid encounter within last 6 months    Recent Outpatient Visits           2 weeks ago Essential hypertension   [place], [place]   1 month ago Essential hypertension   [place], Jarome Matin, RPH-CPP   2 months ago Essential hypertension   [place], MD   2 months ago Generalized body aches   [place], MD   5 months ago Essential hypertension   [place], MD       Future Appointments             In 6 days Werner Lean, MD [place], [facility]   In 2 weeks Carlis Abbott, Gillermo Murdoch, PA-C [place]   In 1 month Ladell Pier, MD [place]

## 2021-06-27 DIAGNOSIS — S065X9A Traumatic subdural hemorrhage with loss of consciousness of unspecified duration, initial encounter: Secondary | ICD-10-CM | POA: Diagnosis not present

## 2021-07-01 ENCOUNTER — Ambulatory Visit (INDEPENDENT_AMBULATORY_CARE_PROVIDER_SITE_OTHER): Payer: Medicaid Other | Admitting: Internal Medicine

## 2021-07-01 ENCOUNTER — Other Ambulatory Visit: Payer: Self-pay

## 2021-07-01 ENCOUNTER — Encounter: Payer: Self-pay | Admitting: Internal Medicine

## 2021-07-01 VITALS — BP 142/92 | HR 82 | Wt 241.4 lb

## 2021-07-01 DIAGNOSIS — Z720 Tobacco use: Secondary | ICD-10-CM | POA: Diagnosis not present

## 2021-07-01 DIAGNOSIS — R9431 Abnormal electrocardiogram [ECG] [EKG]: Secondary | ICD-10-CM

## 2021-07-01 NOTE — Patient Instructions (Signed)
Medication Instructions:  Your physician recommends that you continue on your current medications as directed. Please refer to the Current Medication list given to you today.  *If you need a refill on your cardiac medications before your next appointment, please call your pharmacy*   Lab Work: NONE If you have labs (blood work) drawn today and your tests are completely normal, you will receive your results only by: [place] (if you have MyChart) OR A paper copy in the mail If you have any lab test that is abnormal or we need to change your treatment, we will call you to review the results.   Testing/Procedures: NONE   Follow-Up: As needed  At [facility], you and your health needs are our priority.  As part of our continuing mission to provide you with exceptional heart care, we have created designated Provider Care Teams.  These Care Teams include your primary Cardiologist (physician) and Advanced Practice Providers (APPs -  Physician Assistants and Nurse Practitioners) who all work together to provide you with the care you need, when you need it.
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
## 2021-07-07 ENCOUNTER — Other Ambulatory Visit: Payer: Self-pay | Admitting: *Deleted

## 2021-07-07 ENCOUNTER — Other Ambulatory Visit: Payer: Self-pay

## 2021-07-07 NOTE — Patient Instructions (Signed)
Visit Information  Elizabeth Bush was given information about Medicaid Managed Care team care coordination services as a part of their [facility] Medicaid benefit. Elizabeth Bush verbally consented to engagement with the [facility] Managed Care team.   If you are experiencing a medical emergency, please call 911 or report to your local emergency department or urgent care.   If you have a non-emergency medical problem during routine business hours, please contact your provider's office and ask to speak with a nurse.   For questions related to your [facility] health plan, please call: [phone] or go here:[email or web address]  If you would like to schedule transportation through your [facility] plan, please call the following number at least 2 days in advance of your appointment: [phone].  You can also use the MTM portal or MTM mobile app to manage your rides. For the portal, please go to [email or web address].  Call the [place] at [phone], at any time, 24 hours a day, 7 days a week. If you are in danger or need immediate medical attention call 911.  If you would like help to quit smoking, call 1-800-QUIT-NOW 947-175-4535) OR Espaol: 1-855-Djelo-Ya (5-520-802-2336) o para ms informacin haga clic aqu or Text READY to 200-400 to register via text  Elizabeth Bush,   Please see education materials related to alcohol provided as Advertising account planner.   The patient verbalized understanding of instructions, educational materials, and care plan provided today and agreed to receive a mailed copy of patient instructions, educational materials, and care plan.   Telephone follow up appointment with Managed Medicaid care management team member scheduled for:07/28/21 @ 2:30pm  [name], BSN [place] RN Care Coordinator   Following is a copy of your plan of care:  Care Plan : RN Care Manager Plan of Care  Updates made by  [name], RN since 07/07/2021 12:00 AM     Problem: Care Coordination for managing back pain needed   Priority: High     Long-Range Goal: Develop plan of care for managing back pain and SDOH barriers   Start Date: 03/26/2021  Expected End Date: 08/15/2021  Priority: High  Note:   Current Barriers:  Knowledge Deficits related to plan of care for management of HTN and back pain  Care Coordination needs related to Financial constraints related to affording utilities and Limited access to food  Elizabeth Bush reports not doing so good today. She is not feeling well, having pain in both knees and left hip. She has an appointment with Dr. Ninfa Linden on 07/14/21. She is confused about transportation, she was using [employer] which is no longer an option.  RNCM Clinical Goal(s):  Patient will verbalize understanding of plan for management of back pain as evidenced by scheduling a PCP appointment for evaluation and plan of care attend all scheduled medical appointments: PCP as evidenced by 07/14/21 with Dr. Ninfa Linden and 08/12/21 with PCP        continue to work with RN Care Manager and/or Social Worker to address care management and care coordination needs related to back pain as evidenced by adherence to CM Team Scheduled appointments     work with [employer] care guide to address needs related to Financial constraints related to affording utilities and Limited access to food as evidenced by patient and/or community resource care guide support    through collaboration with Consulting civil engineer, provider, and care team.   Interventions: Inter-disciplinary care team collaboration (see  longitudinal plan of care) Evaluation of current treatment plan related to  self management and patient's adherence to plan as established by provider Provided therapeutic listening Provided information for transportation provided by [facility] [phone] and discussed details on how to arrange  transportation Referral to LCSW for assistance with quitting drinking   Hypertension Interventions:  (Status:  Goal on track:  NO.) Long Term Goal Last practice recorded BP readings:  BP Readings from Last 3 Encounters:  07/01/21 (!) 142/92  06/05/21 (!) 143/96  05/06/21 122/78  Most recent eGFR/CrCl: No results found for: EGFR  No components found for: CRCL  Reviewed medications with patient and discussed importance of compliance Counseled on the importance of exercise goals with target of 150 minutes per week Advised patient, providing education and rationale, to monitor blood pressure daily and record, calling PCP for findings outside established parameters Reviewed scheduled/upcoming provider appointments including:  Discussed complications of poorly controlled blood pressure such as heart disease, stroke, circulatory complications, vision complications, kidney impairment, sexual dysfunction Assessed social determinant of health barriers  Pain:  (Status: Goal Met.) Long Term Goal  Discussed importance of adherence to all scheduled medical appointments; Counseled on the importance of reporting any/all new or changed pain symptoms or management strategies to pain management provider; Advised patient to report to care team affect of pain on daily activities; Reviewed with patient prescribed pharmacological and nonpharmacological pain relief strategies; Assessed social determinant of health barriers;   Patient Goals/Self-Care Activities: Patient will self administer medications as prescribed as evidenced by self report/primary caregiver report  Patient will attend all scheduled provider appointments as evidenced by clinician review of documented attendance to scheduled appointments and patient/caregiver report Patient will call pharmacy for medication refills as evidenced by patient report and review of pharmacy fill history as appropriate Patient will continue to perform ADL's  independently as evidenced by patient/caregiver report Patient will continue to perform IADL's independently as evidenced by patient/caregiver report Patient will call provider office for new concerns or questions as evidenced by review of documented incoming telephone call notes and patient report Patient will work with Care Guide for food resources and assistance with utilities Set an alarm everyday to take medications as directed
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
## 2021-07-09 ENCOUNTER — Other Ambulatory Visit: Payer: Self-pay

## 2021-07-09 ENCOUNTER — Telehealth: Payer: Self-pay | Admitting: Internal Medicine

## 2021-07-09 ENCOUNTER — Ambulatory Visit: Payer: Medicaid Other | Admitting: Physician Assistant

## 2021-07-09 NOTE — Telephone Encounter (Unsigned)
Copied from [place] [phone]. Topic: General - Inquiry >> Jul 09, 2021 12:41 PM Oneta Rack wrote: Reason for CRM: patient received a call from [place] stating they needed to [facility] appointment to 07/14/2021. Patient states her uber driver who was set up through [employer] transportation scheduled the appointment and would like practice to arrange transportation for 07/14/2021 appt. The transportation # provide from the patient is [phone]. Patient would like a follow up call to confirm transportation has been scheduled.

## 2021-07-09 NOTE — Patient Instructions (Signed)
Visit Information  [name]  - as a part of your Medicaid benefit, you are eligible for care management and care coordination services at no cost or copay. I was unable to reach you by phone today but would be happy to help you with your health related needs. Please feel free to call me @ [phone].   A member of the Managed Medicaid care management team will reach out to you again over the next 7 days.   [name], BSW, [place] Managed Medicaid Team  [phone]

## 2021-07-09 NOTE — Patient Outreach (Signed)
Care Coordination  07/09/2021  Elizabeth Bush [birth year] [id number]   Medicaid Managed Care   Unsuccessful Outreach Note  07/09/2021 Name: Elizabeth Bush MRN: [id number] DOB: 12-31-1970  Referred by: Ladell Pier, MD Reason for referral : High Risk Managed Medicaid (MM Social Work [employer])   An unsuccessful telephone outreach was attempted today. The patient was referred to the case management team for assistance with care management and care coordination.   Follow Up Plan: The care management team will reach out to the patient again over the next 7 days.   [name], BSW, [place] Managed Medicaid Team  [phone]

## 2021-07-09 NOTE — Telephone Encounter (Signed)
LVM advising to contact Medicaid [facility] for transportation.

## 2021-07-11 ENCOUNTER — Ambulatory Visit: Payer: Self-pay | Admitting: *Deleted

## 2021-07-11 NOTE — Telephone Encounter (Signed)
°  Chief Complaint: vaginal odor- medication request Symptoms: odor Frequency: 3 days Pertinent Negatives: Patient denies discharge Disposition: [] ED /[x] Urgent Care (no appt availability in office) / [] Appointment(In office/virtual)/ []  [place] [facility]/ [] Home Care/ [] Refused Recommended Disposition /[] [place] [facility]/ []  Follow-up with PCP Additional Notes: No open appointment- advised patient - virtual option, UC- patient states she does not have transportation- patient advised provider not in office- will send request but can not guarantee she will get response today. Patient request medication that she can use with alcohol- she drinks daily

## 2021-07-11 NOTE — Telephone Encounter (Signed)
Contacted pt and went over provider response pt states she will wait till her appt for the 3/28

## 2021-07-11 NOTE — Telephone Encounter (Signed)
Summary: advice - medication   Pt called in stating she thinks she has a bacterial infection and wanted medication sent in, pt did not know the name of the medication but stated it was one she could drink, pt requested to speak with a nurse      Reason for Disposition  [1] Vaginal odor (bad smell) AND [2] not improved > 3 days following CARE ADVICE  Answer Assessment - Initial Assessment Questions 1. SYMPTOM: "What's the main symptom you're concerned about?" (e.g., pain, itching, dryness)     odor 2. LOCATION: "Where is the  odor located?" (e.g., inside/outside, left/right)     vagina 3. ONSET: "When did the  odor  start?"     1/19- treated and symptoms have come back 4. PAIN: "Is there any pain?" If Yes, ask: "How bad is it?" (Scale: 1-10; mild, moderate, severe)     no 5. ITCHING: "Is there any itching?" If Yes, ask: "How bad is it?" (Scale: 1-10; mild, moderate, severe)     no 6. CAUSE: "What do you think is causing the discharge?" "Have you had the same problem before? What happened then?"     BV 7. OTHER SYMPTOMS: "Do you have any other symptoms?" (e.g., fever, itching, vaginal bleeding, pain with urination, injury to genital area, vaginal foreign body)     no 8. PREGNANCY: "Is there any chance you are pregnant?" "When was your last menstrual period?"     *No Answer*  Protocols used: Vaginal Symptoms-A-AH

## 2021-07-11 NOTE — Telephone Encounter (Signed)
Will forward to provider  

## 2021-07-14 ENCOUNTER — Ambulatory Visit: Payer: Medicaid Other | Admitting: Orthopaedic Surgery

## 2021-07-16 ENCOUNTER — Other Ambulatory Visit: Payer: Self-pay | Admitting: Internal Medicine

## 2021-07-16 DIAGNOSIS — R32 Unspecified urinary incontinence: Secondary | ICD-10-CM | POA: Diagnosis not present

## 2021-07-16 DIAGNOSIS — Z419 Encounter for procedure for purposes other than remedying health state, unspecified: Secondary | ICD-10-CM | POA: Diagnosis not present

## 2021-07-16 NOTE — Telephone Encounter (Signed)
Requested medication (s) are due for refill today:  ? ?Requested medication (s) are on the active medication list: Yes ? ?Last refill:  07/01/21 ? ?Future visit scheduled: Yes ? ?Notes to clinic:  Historical provider. ? ? ? ?Requested Prescriptions  ?Pending Prescriptions Disp Refills  ? omeprazole (PRILOSEC) 20 MG capsule [Pharmacy Med Name: omeprazole 20 mg capsule,delayed release] 90 capsule 1  ?  Sig: TAKE ONE CAPSULE BY MOUTH ONCE DAILY  ?  ? Gastroenterology: Proton Pump Inhibitors Passed - 07/16/2021  8:01 AM  ?  ?  Passed - Valid encounter within last 12 months  ?  Recent Outpatient Visits   ? ?      ? 1 month ago Essential hypertension  ? [address] [place], [place], [place]  ? 2 months ago Essential hypertension  ? [place], RPH-CPP  ? 3 months ago Essential hypertension  ? [place] Karle Plumber B, MD  ? 3 months ago Generalized body aches  ? [place] Ladell Pier, MD  ? 6 months ago Essential hypertension  ? [place] Ladell Pier, MD  ? ?  ?  ?Future Appointments   ? ?        ? In 1 week [name], MD [facility]  ? ?  ? ?  ?  ?  ? ?

## 2021-07-18 ENCOUNTER — Other Ambulatory Visit: Payer: Self-pay
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
## 2021-07-18 NOTE — Patient Instructions (Signed)
Visit Information ? ?Elizabeth Bush was given information about Medicaid Managed Care team care coordination services as a part of their [facility] Medicaid benefit. Elizabeth Bush verbally consented to engagement with the [facility] Managed Care team.  ? ?If you are experiencing a medical emergency, please call 911 or report to your local emergency department or urgent care.  ? ?If you have a non-emergency medical problem during routine business hours, please contact your provider's office and ask to speak with a nurse.  ? ?For questions related to your [facility] health plan, please call: [phone] or go here:[email or web address] ? ?If you would like to schedule transportation through your [facility] plan, please call the following number at least 2 days in advance of your appointment: [phone]. ? You can also use the MTM portal or MTM mobile app to manage your rides. For the portal, please go to [email or web address]. ? ?Call the [place] at [phone], at any time, 24 hours a day, 7 days a week. If you are in danger or need immediate medical attention call 911. ? ?If you would like help to quit smoking, call 1-800-QUIT-NOW 619-306-7117) OR Espa?ol: 1-855-D?jelo-Ya 412-416-6117) o para m?s informaci?n haga clic aqu? or Text READY to 200-400 to register via text ? ?Elizabeth Bush - following are the goals we discussed in your visit today:  ? Goals Addressed   ?None ?  ? ? ? ?Social Worker will follow up in 30 days to make sure letter was receieved.  ? ?[name], BSW, MHA ?[place]  ?High Risk Managed Medicaid Team  ?[phone]  ? ?Following is a copy of your plan of care:  ?Care Plan : [place] of Care  ?Updates made by Ethelda Chick since 07/18/2021 12:00 AM  ?  ? ?Problem: Care Coordination for managing back pain needed   ?Priority: High  ?  ? ?Long-Range Goal: Develop plan of care for managing back pain and SDOH barriers    ?Start Date: 03/26/2021  ?Expected End Date: 08/15/2021  ?Priority: High  ?Note:   ?Current Barriers:  ?Knowledge Deficits related to plan of care for management of HTN and back pain  ?Care Coordination needs related to Financial constraints related to affording utilities and Limited access to food  ?Elizabeth Bush reports not doing so good today. She is not feeling well, having pain in both knees and left hip. She has an appointment with Dr. Ninfa Linden on 07/14/21. She is confused about transportation, she was using [employer] which is no longer an option. ? ?RNCM Clinical Goal(s):  ?Patient will verbalize understanding of plan for management of back pain as evidenced by scheduling a PCP appointment for evaluation and plan of care ?attend all scheduled medical appointments: PCP as evidenced by 07/14/21 with Dr. Ninfa Linden and 08/12/21 with PCP        ?continue to work with Consulting civil engineer and/or Social Worker to address care management and care coordination needs related to back pain as evidenced by adherence to CM Team Scheduled appointments     ?work with community resource care guide to address needs related to Financial constraints related to affording utilities and Limited access to food as evidenced by patient and/or community resource care guide support    through collaboration with Consulting civil engineer, provider, and care team.  ? ?Interventions: ?Inter-disciplinary care team collaboration (see longitudinal plan of care) ?Evaluation of current treatment plan related to  self management and patient's adherence to  plan as established by provider ?Provided therapeutic listening ?Provided information for transportation provided by [facility] [phone] and discussed details on how to arrange transportation ?Referral to LCSW for assistance with quitting drinking ?07/18/21: BSW completed outreach call to patient regarding alcohol programs. Patient stated she does not need any programs. She has been drinking since she was  89 and knows how to handle herself. Patient states she would like some outpatient programs to go to may 2 a week to get out of the house. Patient asked for those resources to be mailed to her. No other resources needed at this time. ? ? ?Hypertension Interventions:  (Status:  Goal on track:  NO.) Long Term Goal ?Last practice recorded BP readings:  ?BP Readings from Last 3 Encounters:  ?07/01/21 (!) 142/92  ?06/05/21 (!) 143/96  ?05/06/21 122/78  ?Most recent eGFR/CrCl: No results found for: EGFR  No components found for: CRCL ? ?Reviewed medications with patient and discussed importance of compliance ?Counseled on the importance of exercise goals with target of 150 minutes per week ?Advised patient, providing education and rationale, to monitor blood pressure daily and record, calling PCP for findings outside established parameters ?Reviewed scheduled/upcoming provider appointments including:  ?Discussed complications of poorly controlled blood pressure such as heart disease, stroke, circulatory complications, vision complications, kidney impairment, sexual dysfunction ?Assessed social determinant of health barriers ? ?Pain:  (Status: Goal Met.) Long Term Goal  ?Discussed importance of adherence to all scheduled medical appointments; ?Counseled on the importance of reporting any/all new or changed pain symptoms or management strategies to pain management provider; ?Advised patient to report to care team affect of pain on daily activities; ?Reviewed with patient prescribed pharmacological and nonpharmacological pain relief strategies; ?Assessed social determinant of health barriers;  ? ?Patient Goals/Self-Care Activities: ?Patient will self administer medications as prescribed as evidenced by self report/primary caregiver report  ?Patient will attend all scheduled provider appointments as evidenced by clinician review of documented attendance to scheduled appointments and patient/caregiver report ?Patient will call  pharmacy for medication refills as evidenced by patient report and review of pharmacy fill history as appropriate ?Patient will continue to perform ADL's independently as evidenced by patient/caregiver report ?Patient will continue to perform IADL's independently as evidenced by patient/caregiver report ?Patient will call provider office for new concerns or questions as evidenced by review of documented incoming telephone call notes and patient report ?Patient will work with [place] for food resources and assistance with utilities ?Set an alarm everyday to take medications as directed ? ? ? ?  ?  ?

## 2021-07-23 ENCOUNTER — Other Ambulatory Visit: Payer: Self-pay

## 2021-07-23 ENCOUNTER — Encounter: Payer: Self-pay | Admitting: Orthopaedic Surgery

## 2021-07-23 ENCOUNTER — Ambulatory Visit (INDEPENDENT_AMBULATORY_CARE_PROVIDER_SITE_OTHER): Payer: Medicaid Other

## 2021-07-23 ENCOUNTER — Ambulatory Visit (INDEPENDENT_AMBULATORY_CARE_PROVIDER_SITE_OTHER): Payer: Medicaid Other | Admitting: Orthopaedic Surgery

## 2021-07-23 DIAGNOSIS — M79605 Pain in left leg: Secondary | ICD-10-CM

## 2021-07-23 DIAGNOSIS — M5136 Other intervertebral disc degeneration, lumbar region: Secondary | ICD-10-CM | POA: Diagnosis not present
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
## 2021-07-28 ENCOUNTER — Other Ambulatory Visit: Payer: Self-pay | Admitting: *Deleted

## 2021-07-28 NOTE — Patient Instructions (Signed)
Visit Information ? ?[name]  - as a part of your Medicaid benefit, you are eligible for care management and care coordination services at no cost or copay. I was unable to reach you by phone today but would be happy to help you with your health related needs. Please feel free to call me @ [phone].  ? ?A member of the Managed Medicaid care management team will reach out to you again over the next 14 days.  ? ?[name], BSN ?[place] ?RN Care Coordinator ?  ?

## 2021-07-28 NOTE — Patient Outreach (Signed)
Care Coordination ? ?07/28/2021 ? ?Elizabeth Bush ?1971-03-23 ?[id number] ? ? ?Medicaid Managed Care  ? ?Unsuccessful Outreach Note ? ?07/28/2021 ?Name: Elizabeth Bush MRN: [id number] DOB: [birth year] ? ?Referred by: Ladell Pier, MD ?Reason for referral : High Risk Managed Medicaid (Unsuccessful RNCM follow up telephone outreach) ? ? ?An unsuccessful telephone outreach was attempted today. The patient was referred to the case management team for assistance with care management and care coordination.  ? ?Follow Up Plan: A HIPAA compliant phone message was left for the patient providing contact information and requesting a return call.  ? ?[name], BSN ?[place] ?RN Care Coordinator ? ? ?

## 2021-08-05 ENCOUNTER — Other Ambulatory Visit: Payer: Self-pay | Admitting: Orthopaedic Surgery

## 2021-08-05 ENCOUNTER — Ambulatory Visit: Payer: Self-pay | Admitting: *Deleted

## 2021-08-05 ENCOUNTER — Other Ambulatory Visit: Payer: Self-pay

## 2021-08-05 ENCOUNTER — Ambulatory Visit
Admission: RE | Admit: 2021-08-05 | Discharge: 2021-08-05 | Disposition: A | Discharge status: Home / Self Care | Payer: Medicaid Other | Source: Ambulatory Visit | Attending: Orthopaedic Surgery | Admitting: Orthopaedic Surgery

## 2021-08-05 ENCOUNTER — Telehealth: Payer: Self-pay | Admitting: Physician Assistant

## 2021-08-05 DIAGNOSIS — M545 Low back pain, unspecified: Secondary | ICD-10-CM | POA: Diagnosis not present

## 2021-08-05 DIAGNOSIS — M79605 Pain in left leg: Secondary | ICD-10-CM

## 2021-08-05 DIAGNOSIS — M48061 Spinal stenosis, lumbar region without neurogenic claudication: Secondary | ICD-10-CM | POA: Diagnosis not present

## 2021-08-05 DIAGNOSIS — M47816 Spondylosis without myelopathy or radiculopathy, lumbar region: Secondary | ICD-10-CM | POA: Diagnosis not present

## 2021-08-05 MED ORDER — ACETAMINOPHEN-CODEINE #3 300-30 MG PO TABS: 1.0000 | ORAL_TABLET | Freq: Three times a day (TID) | ORAL | 0 refills | Status: DC | PRN

## 2021-08-05 NOTE — Telephone Encounter (Signed)
Called and advised.pt stated understanding  ?

## 2021-08-05 NOTE — Telephone Encounter (Signed)
Please advise 

## 2021-08-05 NOTE — Telephone Encounter (Signed)
Pt called requesting pain medication. Pt states she just had her MRI and her pains are worse. Please send to pharmacy on file. Please call pt when meds have been sent in. Phone number is [phone]. ?

## 2021-08-05 NOTE — Telephone Encounter (Signed)
Summary: patient complain of pain and req stronger medication  ? ? ? ?  Patient called in stated that she is in lots of pain say that she just got out of having a MRI and the pain is on and she have been in pain for a long while and that the Celebrex is not helping and she will need something stronger. Asking for a call back at Ph# [phone]   ?  ?Called pt - mailbox is full and unable to accept messages. ?

## 2021-08-05 NOTE — Telephone Encounter (Signed)
I returned pt's call.  Called in c/o being in a lot of pain after just having an MRI done.  The celebrex is not helping.   She is requesting something stronger. ? ?I got a message that her "Voicemail cannot accept messages at this time because it is full and to try again later" ? ?(Noticed in chart she has called [facility] requesting a stronger pain medication too today).   ? ? ?

## 2021-08-05 NOTE — Telephone Encounter (Signed)
Summary: patient complain of pain and req stronger medication  ?  Patient called in stated that she is in lots of pain say that she just got out of having a MRI and the pain is on and she have been in pain for a long while and that the Celebrex is not helping and she will need something stronger. Asking for a call back at Ph# [phone]   ?  ?Called pt  - unable to leave message mailbox is full. ?Routing call to office. ?

## 2021-08-06 ENCOUNTER — Other Ambulatory Visit: Payer: Self-pay | Admitting: *Deleted

## 2021-08-06 MED ORDER — TRAMADOL HCL 50 MG PO TABS: 50.0000 mg | ORAL_TABLET | Freq: Two times a day (BID) | ORAL | 0 refills | Status: DC | PRN

## 2021-08-06 NOTE — Patient Outreach (Signed)
Care Coordination ? ?08/06/2021 ? ?[name] ?1971/05/06 ?[id number] ? ? ?Medicaid Managed Care  ? ?Unsuccessful Outreach Note ? ?08/06/2021 ?Name: Elizabeth Bush MRN: [id number] DOB: [birth year] ? ?Referred by: Ladell Pier, MD ?Reason for referral : High Risk Managed Medicaid (Unsuccessful RNCM telephone outreach, 2nd attempt) ? ? ?A second unsuccessful telephone outreach was attempted today. The patient was referred to the case management team for assistance with care management and care coordination.  ? ?Follow Up Plan: The care management team will reach out to the patient again over the next 14 days.  ? ?[name], BSN ?[place] ?RN Care Coordinator ? ? ?

## 2021-08-06 NOTE — Addendum Note (Signed)
Addended by: Karle Plumber B on: 08/06/2021 01:10 PM ? ? Modules accepted: Orders ? ?

## 2021-08-06 NOTE — Telephone Encounter (Signed)
Pt already has an appt scheduled for 3/28  ?

## 2021-08-06 NOTE — Patient Instructions (Signed)
Visit Information ? ?[name]  - as a part of your Medicaid benefit, you are eligible for care management and care coordination services at no cost or copay. I was unable to reach you by phone today but would be happy to help you with your health related needs. Please feel free to call me @ [phone].  ? ?A member of the Managed Medicaid care management team will reach out to you again over the next 14 days.  ? ?[name], BSN ?[place] ?RN Care Coordinator ?  ?

## 2021-08-07 ENCOUNTER — Telehealth: Payer: Self-pay | Admitting: Internal Medicine

## 2021-08-07 NOTE — Telephone Encounter (Signed)
Returned [name] call and made aware that I didn't receive any paperwork in regards to pt. Asked [name] if she is able to resend fax to clinical fax and provided number. [name] will be refaxing paperwork  ?

## 2021-08-07 NOTE — Telephone Encounter (Signed)
Contacted pt and went over provider response. Pt doesn't have any questions or concerns  ?

## 2021-08-07 NOTE — Telephone Encounter (Signed)
Copied from [place] [id number]. Topic: General - Inquiry >> Aug 07, 2021 10:42 AM Alanda Slim E wrote: Fax coming from the office of Anselmo [facility] on 3.18.23/ a request for Dr. Wynetta Emery to consult about the pt and release was attached/ please advise if this was received and when Dr. Wynetta Emery can do the consult

## 2021-08-07 NOTE — Telephone Encounter (Signed)
.. ?  Medicaid Managed Care  ? ?Unsuccessful Outreach Note ? ?08/07/2021 ?Name: Elizabeth Bush MRN: [id number] DOB: 1970/11/19 ? ?Referred by: Ladell Pier, MD ?Reason for referral : High Risk Managed Medicaid (I called the patient today to get her rescheduled for a phone visit with the MM RNCM. I left my name and number on her VM.) ? ? ?An unsuccessful telephone outreach was attempted today. The patient was referred to the case management team for assistance with care management and care coordination.  ? ?Follow Up Plan: The care management team will reach out to the patient again over the next 7 days.  ? ? ?Reita Chard ?Care Guide, High Risk Medicaid Managed Care ?Embedded Care Coordination ?[place]  ? ? ? ?

## 2021-08-12 ENCOUNTER — Other Ambulatory Visit: Payer: Self-pay

## 2021-08-12 ENCOUNTER — Ambulatory Visit: Payer: Medicaid Other | Attending: Internal Medicine | Admitting: Internal Medicine

## 2021-08-12 DIAGNOSIS — F419 Anxiety disorder, unspecified: Secondary | ICD-10-CM | POA: Diagnosis not present

## 2021-08-12 DIAGNOSIS — F339 Major depressive disorder, recurrent, unspecified: Secondary | ICD-10-CM

## 2021-08-12 DIAGNOSIS — M5416 Radiculopathy, lumbar region: Secondary | ICD-10-CM

## 2021-08-12 DIAGNOSIS — F321 Major depressive disorder, single episode, moderate: Secondary | ICD-10-CM

## 2021-08-12 DIAGNOSIS — R519 Headache, unspecified: Secondary | ICD-10-CM

## 2021-08-12 DIAGNOSIS — G8929 Other chronic pain: Secondary | ICD-10-CM

## 2021-08-12 DIAGNOSIS — F1010 Alcohol abuse, uncomplicated: Secondary | ICD-10-CM | POA: Diagnosis not present

## 2021-08-12 DIAGNOSIS — G8921 Chronic pain due to trauma: Secondary | ICD-10-CM
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
## 2021-08-12 NOTE — Telephone Encounter (Signed)
Jarrett Soho called to see if the paperwork was ever received / please advise  ?

## 2021-08-12 NOTE — Telephone Encounter (Signed)
Returned Jarrett Soho call and left a detailed vm making her aware that we have not received paperwork on patient and if she can refax and she has any questions or concerns to give [place] a call  ?

## 2021-08-13 ENCOUNTER — Ambulatory Visit (INDEPENDENT_AMBULATORY_CARE_PROVIDER_SITE_OTHER): Payer: Medicaid Other | Admitting: Orthopaedic Surgery

## 2021-08-13 ENCOUNTER — Other Ambulatory Visit: Payer: Self-pay

## 2021-08-13 ENCOUNTER — Encounter: Payer: Self-pay | Admitting: Orthopaedic Surgery

## 2021-08-13 DIAGNOSIS — M5136 Other intervertebral disc degeneration, lumbar region: Secondary | ICD-10-CM

## 2021-08-13 DIAGNOSIS — G8929 Other chronic pain: Secondary | ICD-10-CM

## 2021-08-13 DIAGNOSIS — M5442 Lumbago with sciatica, left side: Secondary | ICD-10-CM | POA: Diagnosis not present

## 2021-08-13 MED ORDER — DULOXETINE HCL 20 MG PO CPEP: 20.0000 mg | ORAL_CAPSULE | Freq: Every morning | ORAL | 2 refills | Status: DC
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
## 2021-08-15 ENCOUNTER — Telehealth: Payer: Self-pay | Admitting: Orthopaedic Surgery

## 2021-08-15 NOTE — Telephone Encounter (Signed)
Please advise 

## 2021-08-15 NOTE — Telephone Encounter (Signed)
Patient called. She would like to know if she could have a back brace. Her number is [phone] ?

## 2021-08-16 DIAGNOSIS — Z419 Encounter for procedure for purposes other than remedying health state, unspecified: Secondary | ICD-10-CM | POA: Diagnosis not present

## 2021-08-18 ENCOUNTER — Telehealth: Payer: Self-pay | Admitting: Internal Medicine

## 2021-08-18 ENCOUNTER — Other Ambulatory Visit: Payer: Self-pay

## 2021-08-18 NOTE — Telephone Encounter (Signed)
Pt come in tomorrow to try ours on to see if she likes it  ?

## 2021-08-18 NOTE — Telephone Encounter (Signed)
Copied from [place] [phone] [id number]. Topic: General - Inquiry >> Aug 15, 2021  2:22 PM Alanda Slim E wrote: Reason for CRM: Pt would like to have a neck and back brace ordered by Dr. Wynetta Emery / please advise

## 2021-08-18 NOTE — Patient Instructions (Signed)
Visit Information ? ?Ms. Shifa M Zoeller  - as a part of your Medicaid benefit, you are eligible for care management and care coordination services at no cost or copay. I was unable to reach you by phone today but would be happy to help you with your health related needs. Please feel free to call me @ [phone] ? ? ? ?[name], BSW, [place] ?[place]  ?High Risk Managed Medicaid Team  ?[phone]  ?

## 2021-08-18 NOTE — Telephone Encounter (Signed)
Lvm to discuss with pt. ?

## 2021-08-18 NOTE — Telephone Encounter (Signed)
Will forward to provider  

## 2021-08-18 NOTE — Patient Outreach (Signed)
Care Coordination ? ?08/18/2021 ? ?Akirra M Boda ?03/01/1971 ?[id number] ? ? ?Medicaid Managed Care  ? ?Unsuccessful Outreach Note ? ?08/18/2021 ?Name: TERIN DIEROLF MRN: [id number] DOB: February 05, 1971 ? ?Referred by: Ladell Pier, MD ?Reason for referral : High Risk Managed Medicaid (MM social work unsuccessful telephone outreach) ? ? ?An unsuccessful telephone outreach was attempted today. The patient was referred to the case management team for assistance with care management and care coordination.  ? ?Follow Up Plan: The patient has been provided with contact information for the care management team and has been advised to call with any health related questions or concerns.  ? ?[name], BSW, MHA ?[place]  ?High Risk Managed Medicaid Team  ?[phone]  ?

## 2021-08-18 NOTE — Telephone Encounter (Signed)
Contacted pt to go over provider response pt is aware and doesn't have any questions or concerns  

## 2021-08-19 ENCOUNTER — Ambulatory Visit: Payer: Medicaid Other

## 2021-08-19 ENCOUNTER — Other Ambulatory Visit: Payer: Self-pay | Admitting: *Deleted

## 2021-08-19 NOTE — Patient Outreach (Signed)
Care Coordination ? ?08/19/2021 ? ?[name] ?1970-10-20 ?[id number] ? ? ?Medicaid Managed Care  ? ?Unsuccessful Outreach Note ? ?08/19/2021 ?Name: Elizabeth Bush MRN: [id number] DOB: 08/05/1970 ? ?Referred by: Ladell Pier, MD ?Reason for referral : High Risk Managed Medicaid (Unsuccessful RNCM follow up telephone outreach, patient arrived to another appointment) ? ? ?A third unsuccessful telephone outreach was attempted today. The patient was referred to the case management team for assistance with care management and care coordination. RNCM noted patient was arrived to another appointment this morning. RNCM will attempt to reach patient for follow up later this week. ? ?Follow Up Plan: A HIPAA compliant phone message was left for the patient providing contact information and requesting a return call.  ?The care management team will reach out to the patient again over the next 7 days.  ? ?[name], BSN ?[place] ?RN Care Coordinator ? ? ?

## 2021-08-19 NOTE — Patient Instructions (Signed)
Visit Information ? ?[name]  - as a part of your Medicaid benefit, you are eligible for care management and care coordination services at no cost or copay. I was unable to reach you by phone today but would be happy to help you with your health related needs. Please feel free to call me @ [phone].  ? ?A member of the Managed Medicaid care management team will reach out to you again over the next 7 days.  ? ?[name], BSN ?[place] ?RN Care Coordinator ?  ?

## 2021-08-20 ENCOUNTER — Telehealth: Payer: Self-pay | Admitting: Internal Medicine

## 2021-08-20 ENCOUNTER — Other Ambulatory Visit: Payer: Self-pay | Admitting: Internal Medicine

## 2021-08-20 DIAGNOSIS — G44321 Chronic post-traumatic headache, intractable: Secondary | ICD-10-CM

## 2021-08-20 DIAGNOSIS — K119 Disease of salivary gland, unspecified: Secondary | ICD-10-CM
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
## 2021-08-21 ENCOUNTER — Telehealth: Payer: Self-pay | Admitting: *Deleted

## 2021-08-21 NOTE — Telephone Encounter (Signed)
Noted  

## 2021-08-21 NOTE — Patient Outreach (Signed)
Care Coordination ? ?08/21/2021 ? ?Elizabeth Bush ?March 29, 1971 ?[id number] ? ? ?Medicaid Managed Care  ? ?Unsuccessful Outreach Note ? ?08/21/2021 ?Name: Elizabeth Bush MRN: [id number] DOB: 06/03/1970 ? ?Referred by: Ladell Pier, MD ?Reason for referral : High Risk Managed Medicaid (Unsuccessful RNCM follow up telephone outreach, 2nd attempt) ? ? ?A second unsuccessful telephone outreach was attempted today. The patient was referred to the case management team for assistance with care management and care coordination.  ? ?Follow Up Plan: A HIPAA compliant phone message was left for the patient providing contact information and requesting a return call.  ? ?[name], BSN ?[place] ?RN Care Coordinator ? ? ?

## 2021-08-21 NOTE — Patient Instructions (Signed)
Visit Information ? ?Ms. Jaedin M Jollie  - as a part of your Medicaid benefit, you are eligible for care management and care coordination services at no cost or copay. I was unable to reach you by phone today but would be happy to help you with your health related needs. Please feel free to call me @ [phone].  ? ?A member of the Managed Medicaid care management team will reach out to you again over the next 14 days.  ? ?[name], BSN ?[place] ?RN Care Coordinator ?  ?

## 2021-08-27 ENCOUNTER — Other Ambulatory Visit: Payer: Self-pay | Admitting: *Deleted

## 2021-08-27 NOTE — Patient Outreach (Signed)
Care Coordination ? ?08/27/2021 ? ?[name] ?05-Apr-1971 ?[id number] ? ? ?Medicaid Managed Care  ? ?Unsuccessful Outreach Note ? ?08/27/2021 ?Name: Elizabeth Bush MRN: [id number] DOB: 1971/03/02 ? ?Referred by: Ladell Pier, MD ?Reason for referral : Case Closure (RNCM performing Case Closure for 3 or more unsuccessful outreach attempts) ? ? ?Three unsuccessful telephone outreach attempts have been made. The patient was referred to the case management team for assistance with care management and care coordination. The patient's primary care provider has been notified of our unsuccessful attempts to make or maintain contact with the patient. The care management team is pleased to engage with this patient at any time in the future should he/she be interested in assistance from the care management team.  ? ?Follow Up Plan: We have been unable to make contact with the patient for follow up. The care management team is available to follow up with the patient after provider conversation with the patient regarding recommendation for care management engagement and subsequent re-referral to the care management team.  ? ?[name], BSN ?[place] ?RN Care Coordinator ? ? ?

## 2021-08-29 ENCOUNTER — Ambulatory Visit (INDEPENDENT_AMBULATORY_CARE_PROVIDER_SITE_OTHER): Payer: Medicaid Other | Admitting: Physical Medicine and Rehabilitation

## 2021-08-29 ENCOUNTER — Encounter: Payer: Self-pay | Admitting: Physical Medicine and Rehabilitation

## 2021-08-29 VITALS — BP 166/102 | HR 98

## 2021-08-29 DIAGNOSIS — M48062 Spinal stenosis, lumbar region with neurogenic claudication: Secondary | ICD-10-CM

## 2021-08-29 DIAGNOSIS — M47816 Spondylosis without myelopathy or radiculopathy, lumbar region: Secondary | ICD-10-CM

## 2021-08-29 DIAGNOSIS — M5442 Lumbago with sciatica, left side: Secondary | ICD-10-CM

## 2021-08-29 DIAGNOSIS — M5416 Radiculopathy, lumbar region: Secondary | ICD-10-CM

## 2021-08-29 DIAGNOSIS — G894 Chronic pain syndrome: Secondary | ICD-10-CM | POA: Diagnosis not present

## 2021-08-29 DIAGNOSIS — D1779 Benign lipomatous neoplasm of other sites: Secondary | ICD-10-CM

## 2021-08-29 DIAGNOSIS — G8929 Other chronic pain: Secondary | ICD-10-CM | POA: Diagnosis not present
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
## 2021-09-10 ENCOUNTER — Ambulatory Visit
Admission: RE | Admit: 2021-09-10 | Discharge: 2021-09-10 | Disposition: A | Discharge status: Home / Self Care | Payer: Medicaid Other | Source: Ambulatory Visit | Attending: Internal Medicine | Admitting: Internal Medicine

## 2021-09-10 DIAGNOSIS — G44321 Chronic post-traumatic headache, intractable: Secondary | ICD-10-CM

## 2021-09-10 DIAGNOSIS — R519 Headache, unspecified: Secondary | ICD-10-CM | POA: Diagnosis not present

## 2021-09-12 ENCOUNTER — Telehealth: Payer: Self-pay | Admitting: Clinical

## 2021-09-12 NOTE — Telephone Encounter (Signed)
I spoke with pt and referred her to [facility] for therapy. She is requesting this to be virtual. I will send [facility] resource to pt's mychart. I encouraged her to contact them if she doesn't hear from them in a week. ?

## 2021-09-15 DIAGNOSIS — Z419 Encounter for procedure for purposes other than remedying health state, unspecified: Secondary | ICD-10-CM | POA: Diagnosis not present

## 2021-09-19 DIAGNOSIS — R32 Unspecified urinary incontinence: Secondary | ICD-10-CM | POA: Diagnosis not present

## 2021-09-23 ENCOUNTER — Telehealth: Payer: Self-pay | Admitting: Internal Medicine

## 2021-09-23 NOTE — Telephone Encounter (Signed)
Call returned to patient, she explained that she used to call [employer] [employer] for her rides to medical appointments but that service is not available any longer.  She has the phone number for [employer] transportation and said she has called and scheduled rides twice and the ride never showed up. She then said that she has been having problems with her phone. She may ave missed confirmation calls. I told her that I would check with Lurena Joiner, RN/THN about her transportation issues.  The patient is aware that she needs to give the transportation service 2 day notice of the appointment and she can call, the provider's office does not need to call for her.  ? ?Message sent to Lurena Joiner, RN/THN explaining patient's need for transportation.  ?

## 2021-09-23 NOTE — Telephone Encounter (Signed)
Please set up transportation for pt for her appt w/ Dr Wynetta Emery on 10/20/2021 at 10:10 am. ?

## 2021-09-23 NOTE — Telephone Encounter (Signed)
Routing to Case Manager

## 2021-09-24 ENCOUNTER — Telehealth: Payer: Self-pay | Admitting: *Deleted

## 2021-09-24 ENCOUNTER — Telehealth: Payer: Self-pay

## 2021-09-24 ENCOUNTER — Telehealth: Payer: Self-pay | Admitting: Internal Medicine

## 2021-09-24 NOTE — Patient Outreach (Signed)
Care Coordination ? ?09/24/2021 ? ?Elizabeth Bush ?[birth year] ?[id number] ? ?RNCM was contacted by Care Manager, Opal Sidles to assist [name] with medical transportation. A detailed HIPAA compliant message was left, including RNCM contact information. RNCM left message explaining to contact this RNCM if she needed assistance arranging transportation and that if she arranged transportation and they did not pick her up, she will need to contact [facility] to report the occurrence. ?

## 2021-09-24 NOTE — Telephone Encounter (Signed)
Pt called to see if her cousin would be able to help her in the home Elizabeth Bush / she works for [employer]  as a home aid/ pt wants to see if she could do it since she has available hrs and the pt feels more comfortable with her / pt will call back tomorrow afternoon due to her phone issues if the office cant reach her  ?

## 2021-09-24 NOTE — Telephone Encounter (Signed)
I called the patient and she said she has difficulty getting up and down the stairs in her home due to her chronic back pain. I explained that PCS focus on the need for assistance with personal care, not housekeeping.  She said she understood and needs help with bathing, getting in/out of the bathtub. I also explained that her insurance company will come to her home to do an assessment of her needs and determine if she is eligible for services and if so, how many hours/week.   ? ?As far as her family member working for [employer], after [employer] determines her eligibility, she will be able to choose an agency that is in their network to provide her care. I am not sure if [facility] is in network.  ?

## 2021-09-24 NOTE — Telephone Encounter (Signed)
Will forward to provider ? ?Tried to contact pt pt didn't answer and was unable to lvm due to vm being full.Elizabeth Bush  ?

## 2021-09-24 NOTE — Telephone Encounter (Signed)
Contacted pt to go over CT results pt is aware and doesn't have any questions or concerns  

## 2021-09-24 NOTE — Telephone Encounter (Signed)
Copied from [place] [id number]. Topic: Referral - Request for Referral ?>> Sep 24, 2021  8:42 AM Tessa Lerner A wrote: ?Has patient seen PCP for this complaint? Yes.   ?*If NO, is insurance requiring patient see PCP for this issue before PCP can refer them? ?Referral for which specialty: [facility]  ?Preferred provider/office: Patient has no preference  ?Reason for referral: Patient would like to receive in home assistance ?

## 2021-09-25 NOTE — Telephone Encounter (Signed)
Ok.  I will prepare it for you to sign  ?

## 2021-09-29 ENCOUNTER — Other Ambulatory Visit: Payer: Self-pay | Admitting: Internal Medicine

## 2021-10-06 NOTE — Telephone Encounter (Addendum)
Referral for [facility] faxed to [facility] 10/03/2021.

## 2021-10-07 NOTE — Telephone Encounter (Signed)
Pt called back b/c she saw a missed call from the office. Pt aware PCS faxed to [facility].  Pt asked about [facility] being in her network.  Did you find out anything?

## 2021-10-09 ENCOUNTER — Telehealth: Payer: Self-pay | Admitting: Orthopaedic Surgery

## 2021-10-09 NOTE — Telephone Encounter (Signed)
Jarrett Soho from [facility] is trying send over documentation for a request for a consult with Dr. Ninfa Linden to develop a life care plan for patient. Do you have an email address I can send to her so she can send the documents. There seems to be an issues with fax.  Call back number is: [phone]

## 2021-10-09 NOTE — Telephone Encounter (Signed)
Returned phone call to Jarrett Soho and gave Ashley's email address to send documents.

## 2021-10-14 ENCOUNTER — Telehealth: Payer: Self-pay | Admitting: Orthopaedic Surgery

## 2021-10-14 NOTE — Telephone Encounter (Signed)
Deltana pt no longer needs 15 min phone conference. Contact Santiago Glad stated spoke with Renato Gails and confirmed pt treatment completed for this issue. Nothing further Dr. Ninfa Linden needs to be done

## 2021-10-15 DIAGNOSIS — R32 Unspecified urinary incontinence: Secondary | ICD-10-CM | POA: Diagnosis not present

## 2021-10-16 ENCOUNTER — Encounter: Payer: Medicaid Other | Admitting: Physical Medicine and Rehabilitation

## 2021-10-16 ENCOUNTER — Telehealth: Payer: Self-pay | Admitting: Physical Medicine and Rehabilitation

## 2021-10-16 ENCOUNTER — Telehealth: Payer: Self-pay

## 2021-10-16 DIAGNOSIS — Z419 Encounter for procedure for purposes other than remedying health state, unspecified: Secondary | ICD-10-CM | POA: Diagnosis not present

## 2021-10-16 NOTE — Telephone Encounter (Signed)
Elizabeth Bush called to cancel her appt @ 2:30 today. She had a last minute transportation issue. Can you please reach out to her to reschedule?

## 2021-10-16 NOTE — Telephone Encounter (Signed)
I called the patient about transportation to her upcoming appointment with Dr Wynetta Emery on 10/20/2021.  The visit is now scheduled as virtual, not in person. She said she prefers virtual because she needs a wheelchair and does not like the cars that [employer] company sends for rides.  I explained to her that Lurena Joiner, RN/THN can assist with transportation arrangements. She said she understood but will keep the virtual appointment on 10/20/2021

## 2021-10-19 ENCOUNTER — Other Ambulatory Visit: Payer: Self-pay | Admitting: Internal Medicine

## 2021-10-20 ENCOUNTER — Ambulatory Visit: Payer: Medicaid Other | Attending: Internal Medicine | Admitting: Internal Medicine

## 2021-10-20 DIAGNOSIS — F1090 Alcohol use, unspecified, uncomplicated: Secondary | ICD-10-CM | POA: Diagnosis not present

## 2021-10-20 DIAGNOSIS — R269 Unspecified abnormalities of gait and mobility: Secondary | ICD-10-CM

## 2021-10-20 DIAGNOSIS — K119 Disease of salivary gland, unspecified: Secondary | ICD-10-CM | POA: Diagnosis not present

## 2021-10-20 DIAGNOSIS — F419 Anxiety disorder, unspecified: Secondary | ICD-10-CM | POA: Diagnosis not present

## 2021-10-20 DIAGNOSIS — R519 Headache, unspecified: Secondary | ICD-10-CM

## 2021-10-20 DIAGNOSIS — G8929 Other chronic pain: Secondary | ICD-10-CM | POA: Diagnosis not present

## 2021-10-20 DIAGNOSIS — I10 Essential (primary) hypertension: Secondary | ICD-10-CM | POA: Diagnosis not present

## 2021-10-20 DIAGNOSIS — F339 Major depressive disorder, recurrent, unspecified: Secondary | ICD-10-CM

## 2021-10-20 DIAGNOSIS — M545 Low back pain, unspecified: Secondary | ICD-10-CM | POA: Diagnosis not present
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
## 2021-10-27 ENCOUNTER — Other Ambulatory Visit: Payer: Self-pay | Admitting: Internal Medicine

## 2021-10-28 NOTE — Telephone Encounter (Signed)
Requested Prescriptions  Pending Prescriptions Disp Refills  . omeprazole (PRILOSEC) 20 MG capsule [Pharmacy Med Name: omeprazole 20 mg capsule,delayed release] 90 capsule 0    Sig: TAKE ONE CAPSULE BY MOUTH ONCE DAILY     Gastroenterology: Proton Pump Inhibitors Passed - 10/27/2021 12:55 PM      Passed - Valid encounter within last 12 months    Recent Outpatient Visits          1 week ago Chronic left-sided low back pain, unspecified whether sciatica present   [place], [place]   2 months ago Chronic pain due to trauma   [place], MD   4 months ago Essential hypertension   [place], [place]   5 months ago Essential hypertension   [place], RPH-CPP   6 months ago Essential hypertension   [place] Ladell Pier, MD

## 2021-10-29 ENCOUNTER — Telehealth: Payer: Self-pay | Admitting: Internal Medicine
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
## 2021-10-30 NOTE — Telephone Encounter (Signed)
Elizabeth Bush has called to request resubmission of the paperwork  Page 2 was specifically difficult to view   Please fax to [phone] Attention Elizabeth Bush   Please contact further if needed

## 2021-10-30 NOTE — Telephone Encounter (Signed)
The call was about paperwork that was needing to be faxed but their was no fax number  I called pt and let her know she said that she will come and pick paperwork up, per Doctor Wynetta Emery I will also call and get fax number to send just in case of misplacement

## 2021-10-31 NOTE — Telephone Encounter (Signed)
Paper work resent.

## 2021-11-10 ENCOUNTER — Ambulatory Visit: Payer: Self-pay

## 2021-11-10 ENCOUNTER — Encounter: Payer: Self-pay | Admitting: Physical Medicine and Rehabilitation

## 2021-11-10 ENCOUNTER — Ambulatory Visit (INDEPENDENT_AMBULATORY_CARE_PROVIDER_SITE_OTHER): Payer: Medicaid Other | Admitting: Physical Medicine and Rehabilitation

## 2021-11-10 VITALS — BP 137/96 | HR 93

## 2021-11-10 DIAGNOSIS — M5416 Radiculopathy, lumbar region: Secondary | ICD-10-CM | POA: Diagnosis not present

## 2021-11-10 DIAGNOSIS — Z7689 Persons encountering health services in other specified circumstances: Secondary | ICD-10-CM | POA: Diagnosis not present

## 2021-11-10 MED ORDER — METHYLPREDNISOLONE ACETATE 80 MG/ML IJ SUSP
80.0000 mg | Freq: Once | INTRAMUSCULAR | Status: AC
  Administered 2021-11-10: 80 mg

## 2021-11-15 DIAGNOSIS — Z419 Encounter for procedure for purposes other than remedying health state, unspecified: Secondary | ICD-10-CM | POA: Diagnosis not present

## 2021-11-24 ENCOUNTER — Other Ambulatory Visit: Payer: Self-pay

## 2021-11-24 ENCOUNTER — Other Ambulatory Visit: Payer: Self-pay | Admitting: Internal Medicine

## 2021-11-24 DIAGNOSIS — I10 Essential (primary) hypertension: Secondary | ICD-10-CM

## 2021-11-24 MED ORDER — AMLODIPINE BESYLATE 10 MG PO TABS: 10.0000 mg | ORAL_TABLET | Freq: Every day | ORAL | 3 refills | Status: DC

## 2021-11-24 NOTE — Telephone Encounter (Signed)
Pt no longer uses [facility] which is where her last RX refill was sent in September of last year/ she needs a new Rx for amLODipine (NORVASC) 10 MG tablet to go to [facility] / please advise    [facility] - [place], [place] - [address]

## 2021-11-26 ENCOUNTER — Telehealth: Payer: Self-pay | Admitting: Emergency Medicine
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
## 2021-11-27 NOTE — Telephone Encounter (Signed)
Pt was called to verify if letter was received from Dr.Johnson's office, she states that the letter was not provided from this office, she was informed to reach out to [place] office to inquire what is needed.

## 2021-12-16 DIAGNOSIS — Z419 Encounter for procedure for purposes other than remedying health state, unspecified: Secondary | ICD-10-CM | POA: Diagnosis not present

## 2021-12-28 DIAGNOSIS — R32 Unspecified urinary incontinence: Secondary | ICD-10-CM | POA: Diagnosis not present

## 2021-12-29 ENCOUNTER — Other Ambulatory Visit: Payer: Self-pay | Admitting: Internal Medicine

## 2022-01-16 DIAGNOSIS — Z419 Encounter for procedure for purposes other than remedying health state, unspecified: Secondary | ICD-10-CM | POA: Diagnosis not present

## 2022-01-22 DIAGNOSIS — R32 Unspecified urinary incontinence: Secondary | ICD-10-CM | POA: Diagnosis not present

## 2022-01-28 ENCOUNTER — Other Ambulatory Visit: Payer: Self-pay | Admitting: Internal Medicine

## 2022-02-15 DIAGNOSIS — Z419 Encounter for procedure for purposes other than remedying health state, unspecified: Secondary | ICD-10-CM | POA: Diagnosis not present

## 2022-02-16 ENCOUNTER — Telehealth: Payer: Self-pay | Admitting: Orthopaedic Surgery

## 2022-02-16 ENCOUNTER — Other Ambulatory Visit: Payer: Self-pay | Admitting: Orthopaedic Surgery

## 2022-02-16 MED ORDER — ACETAMINOPHEN-CODEINE 300-30 MG PO TABS: 1.0000 | ORAL_TABLET | Freq: Three times a day (TID) | ORAL | 0 refills | Status: DC | PRN

## 2022-02-16 NOTE — Telephone Encounter (Signed)
Please advise 

## 2022-02-16 NOTE — Telephone Encounter (Signed)
Patient called advised she fell today and hurt both of her knees. Patient asked if Dr. Ninfa Linden will call in some pain medication for her? The number to contact patient is [phone]

## 2022-03-09 ENCOUNTER — Ambulatory Visit: Payer: Medicaid Other | Attending: Internal Medicine | Admitting: Internal Medicine

## 2022-03-09 DIAGNOSIS — R159 Full incontinence of feces: Secondary | ICD-10-CM

## 2022-03-09 DIAGNOSIS — N3946 Mixed incontinence: Secondary | ICD-10-CM | POA: Diagnosis not present

## 2022-03-09 DIAGNOSIS — R3981 Functional urinary incontinence: Secondary | ICD-10-CM
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
## 2022-03-11 DIAGNOSIS — R32 Unspecified urinary incontinence: Secondary | ICD-10-CM | POA: Diagnosis not present

## 2022-03-18 DIAGNOSIS — Z419 Encounter for procedure for purposes other than remedying health state, unspecified: Secondary | ICD-10-CM | POA: Diagnosis not present

## 2022-03-24 ENCOUNTER — Other Ambulatory Visit: Payer: Self-pay | Admitting: Internal Medicine

## 2022-03-30 ENCOUNTER — Other Ambulatory Visit: Payer: Self-pay

## 2022-03-30 ENCOUNTER — Emergency Department (HOSPITAL_COMMUNITY)
Admission: EM | Admit: 2022-03-30 | Discharge: 2022-03-30 | Discharge status: Against Medical Advice | Payer: Medicaid Other | Attending: Emergency Medicine | Admitting: Emergency Medicine

## 2022-03-30 ENCOUNTER — Encounter (HOSPITAL_COMMUNITY): Payer: Self-pay

## 2022-03-30 DIAGNOSIS — Z5321 Procedure and treatment not carried out due to patient leaving prior to being seen by health care provider: Secondary | ICD-10-CM | POA: Insufficient documentation

## 2022-03-30 DIAGNOSIS — R21 Rash and other nonspecific skin eruption: Secondary | ICD-10-CM | POA: Diagnosis not present

## 2022-03-30 DIAGNOSIS — R509 Fever, unspecified: Secondary | ICD-10-CM | POA: Diagnosis not present

## 2022-03-30 NOTE — ED Triage Notes (Addendum)
Rash noted under left breast with redness x1 week. Pt describes burning and itching.

## 2022-03-30 NOTE — ED Notes (Signed)
Pt called x1 in ED lobby. Pt could not be located within ED lobby, and did not respond to name call.   °

## 2022-03-30 NOTE — ED Provider Triage Note (Signed)
Emergency Medicine Provider Triage Evaluation Note  Elizabeth Bush , a 51 y.o. female  was evaluated in triage.  Pt complains of rash under her left breast for 1 week.  The rash is itchy with burning sensation.  No blood or fluid drainage.  Reports subjective fever at home.  Has not tried any topical cream.  No chest pain or shortness of breath.  Review of Systems  Positive: As above Negative: As above  Physical Exam  BP (!) 154/96   Pulse 90   Temp 98.5 F (36.9 C)   Resp 20   LMP  (LMP Unknown)   SpO2 97%  Gen:   Awake, no distress   Resp:  Normal effort  MSK:   Moves extremities without difficulty  Other:    Medical Decision Making  Medically screening exam initiated at 5:11 PM.  Appropriate orders placed.  Elizabeth Bush was informed that the remainder of the evaluation will be completed by another provider, this initial triage assessment does not replace that evaluation, and the importance of remaining in the ED until their evaluation is complete.     Rex Kras, [place] 03/31/22 [phone]

## 2022-04-15 ENCOUNTER — Other Ambulatory Visit: Payer: Self-pay | Admitting: Internal Medicine

## 2022-04-16 ENCOUNTER — Telehealth: Payer: Self-pay | Admitting: Emergency Medicine

## 2022-04-16 DIAGNOSIS — H547 Unspecified visual loss: Secondary | ICD-10-CM

## 2022-04-16 NOTE — Telephone Encounter (Signed)
Copied from [place] [id number]. Topic: General - Other >> Apr 16, 2022  9:43 AM Leitha Schuller wrote: Pt inquiring if provide can write a Rx for eyeglasses  Pt requesting a cb  Please fu w/ pt

## 2022-04-16 NOTE — Telephone Encounter (Signed)
Called & spoke to patient. Confirmed name & DOB. Informed of message below. Patient stated that she has seen a specialist before but would like a second opinion and is requesting for a referral to see a different optometrist / ophthalmologist.

## 2022-04-16 NOTE — Addendum Note (Signed)
Addended by: Karle Plumber B on: 04/16/2022 08:56 PM   Modules accepted: Orders

## 2022-04-17 DIAGNOSIS — Z419 Encounter for procedure for purposes other than remedying health state, unspecified: Secondary | ICD-10-CM | POA: Diagnosis not present

## 2022-04-22 ENCOUNTER — Other Ambulatory Visit: Payer: Self-pay | Admitting: Internal Medicine

## 2022-04-22 NOTE — Telephone Encounter (Signed)
Called & spoke to patient. Informed that referral for ophthalmology has been sent. Patient expressed understanding.

## 2022-04-30 ENCOUNTER — Ambulatory Visit: Payer: Self-pay
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
## 2022-04-30 NOTE — Telephone Encounter (Signed)
Patient states she fell a month ago down her stairs and states her left side neck and arm pain is not improving, patient did not receive any medical attention but stated she caller her lawyer. Lawyer advised patient to go tot he ED but patient declined.    Also sent a CRM to the practice regarding the below:   Reason for CRM: Patient seeking guidance from PCP, patient unable to connect with [facility] Medicaid to renew her insurance and patient is unsure what to do. Patient is also requesting a Education officer, museum.   Left message to call back.

## 2022-05-01 ENCOUNTER — Telehealth: Payer: Self-pay | Admitting: Internal Medicine

## 2022-05-01 NOTE — Telephone Encounter (Signed)
Copied from [place] [id number]. Topic: General - Other >> Apr 30, 2022  9:56 AM Tiffany B wrote: Reason for CRM: Patient seeking guidance from PCP, patient unable to connect with [facility] Medicaid to renew her insurance and patient is unsure what to do. Patient is also requesting a Education officer, museum.

## 2022-05-14 DIAGNOSIS — R32 Unspecified urinary incontinence: Secondary | ICD-10-CM | POA: Diagnosis not present

## 2022-05-18 DIAGNOSIS — Z419 Encounter for procedure for purposes other than remedying health state, unspecified: Secondary | ICD-10-CM | POA: Diagnosis not present

## 2022-05-20 NOTE — Telephone Encounter (Signed)
LVM for patient to f/u for any resources or appt needed with Rosana Hoes LCSW

## 2022-05-20 NOTE — Telephone Encounter (Signed)
Please follow up with patient. If patient needs to schedule an appointment with me. Please let me know.

## 2022-06-17 DIAGNOSIS — H25813 Combined forms of age-related cataract, bilateral: Secondary | ICD-10-CM | POA: Diagnosis not present

## 2022-06-17 DIAGNOSIS — H40023 Open angle with borderline findings, high risk, bilateral: Secondary | ICD-10-CM | POA: Diagnosis not present

## 2022-06-17 DIAGNOSIS — E119 Type 2 diabetes mellitus without complications: Secondary | ICD-10-CM | POA: Diagnosis not present

## 2022-06-17 LAB — HM DIABETES EYE EXAM

## 2022-06-18 DIAGNOSIS — Z419 Encounter for procedure for purposes other than remedying health state, unspecified: Secondary | ICD-10-CM | POA: Diagnosis not present

## 2022-06-22 ENCOUNTER — Other Ambulatory Visit: Payer: Self-pay | Admitting: Internal Medicine

## 2022-06-23 NOTE — Telephone Encounter (Signed)
Requested medication (s) are due for refill today: yes  Requested medication (s) are on the active medication list: yes  Last refill:  03/24/22 #90  Future visit scheduled: yes  Notes to clinic:  overdue lab work   Requested Prescriptions  Pending Prescriptions Disp Refills   celecoxib (CELEBREX) 200 MG capsule [Pharmacy Med Name: celecoxib 200 mg capsule] 90 capsule 0    Sig: TAKE ONE CAPSULE BY MOUTH ONCE DAILY     Analgesics:  COX2 Inhibitors Failed - 06/22/2022 12:19 PM      Failed - Manual Review: Labs are only required if the patient has taken medication for more than 8 weeks.      Failed - HGB in normal range and within 360 days    Hemoglobin  Date Value Ref Range Status  04/21/2021 11.4 (L) 12.0 - 15.0 g/dL Final  02/06/2020 8.9 (L) 11.1 - 15.9 g/dL Final         Failed - Cr in normal range and within 360 days    Creatinine  Date Value Ref Range Status  04/21/2021 0.68 0.44 - 1.00 mg/dL Final         Failed - HCT in normal range and within 360 days    HCT  Date Value Ref Range Status  04/21/2021 33.1 (L) 36.0 - 46.0 % Final   Hematocrit  Date Value Ref Range Status  02/06/2020 27.6 (L) 34.0 - 46.6 % Final         Failed - AST in normal range and within 360 days    AST  Date Value Ref Range Status  04/21/2021 75 (H) 15 - 41 U/L Final         Failed - ALT in normal range and within 360 days    ALT  Date Value Ref Range Status  04/21/2021 42 0 - 44 U/L Final         Failed - eGFR is 30 or above and within 360 days    GFR calc Af Amer  Date Value Ref Range Status  09/15/2019 122 >59 mL/min/1.73 Final    Comment:    **[facility] currently reports eGFR in compliance with the current**   recommendations of the [employer]. [facility] will   update reporting as new guidelines are published from the NKF-ASN   Task force.    GFR, Estimated  Date Value Ref Range Status  04/21/2021 >60 >60 mL/min Final    Comment:    (NOTE) Calculated using the  CKD-EPI Creatinine Equation (2021)    GFR  Date Value Ref Range Status  06/13/2020 104.73 >60.00 mL/min Final    Comment:    Calculated using the CKD-EPI Creatinine Equation (2021)         Failed - Valid encounter within last 12 months    Recent Outpatient Visits           3 months ago Mixed stress and urge urinary incontinence   [place], MD   8 months ago Chronic left-sided low back pain, unspecified whether sciatica present   [place], MD   10 months ago Chronic pain due to trauma   [facility] Lad[place] Pier, MD   1 year ago Essential hypertension   [place], Dionne Bucy, [place]   1 year ago Essential hypertension   [place], RPH-CPP       Future Appointments             In 2 weeks Wynetta Emery, Dalbert Batman, MD [place] - Patient is not pregnant

## 2022-07-05 IMAGING — CT CT HEAD W/O CM
4 series · 17 of 47 positions shown, 19 images · non-contrast
Comparison: Films from earlier in the same day.

CLINICAL DATA: Follow-up left-sided subdural hematoma

EXAM:
CT HEAD WITHOUT CONTRAST
TECHNIQUE: Contiguous axial images were obtained from the base of the skull
through the vertex without intravenous contrast.

[Series 3: head without (person_name) · axial · non-contrast · 0.48mm/px · z∈[-96,+38]mm · 7 of 37 slices shown, 9 images]
[im 5/37  brain]
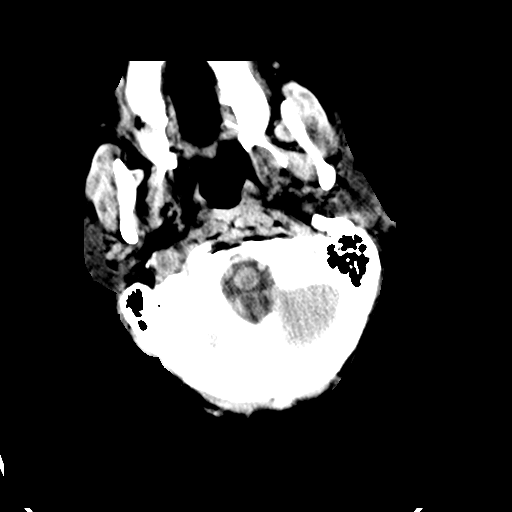
[im 5/37  bone]
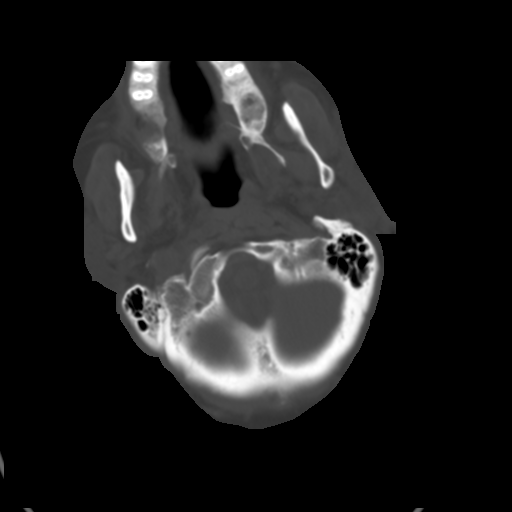
[im 10/37  brain]
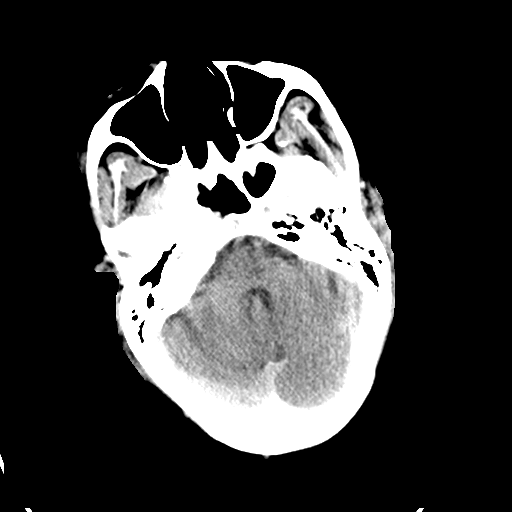
[im 14/37  brain]
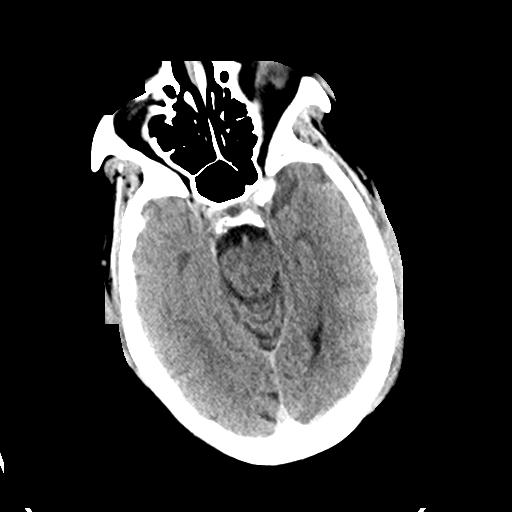
[im 19/37  brain]
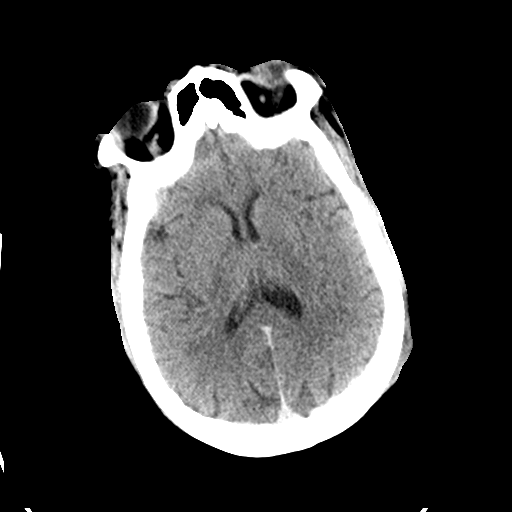
[im 23/37  brain]
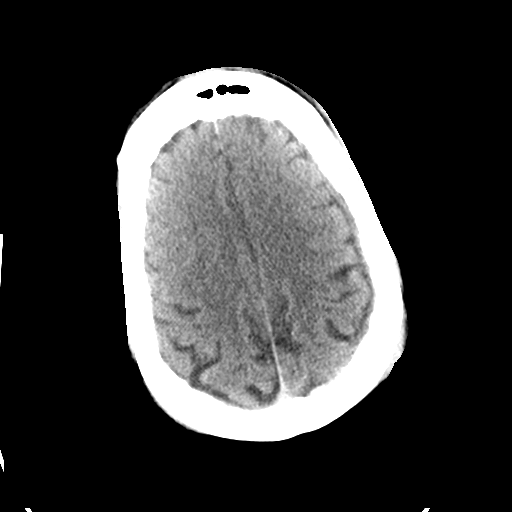
[im 23/37  bone]
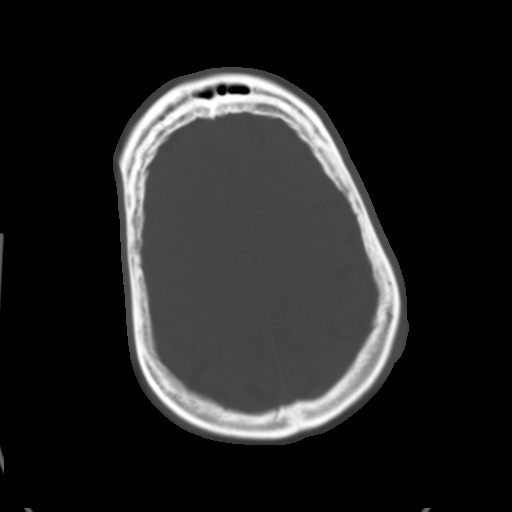
[im 28/37  brain]
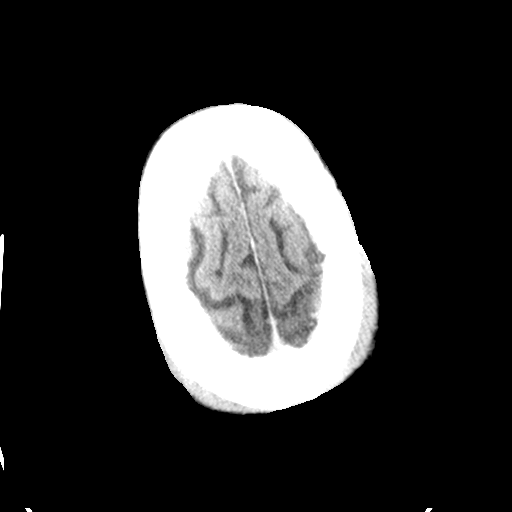
[im 32/37  brain]
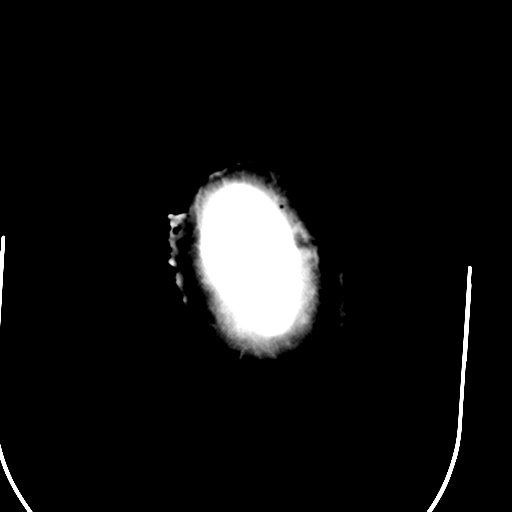

[Series 4: head bone · axial · 0.48mm/px · z∈[-98,-36]mm · 4 of 92 slices shown]
[im 10/92  bone]
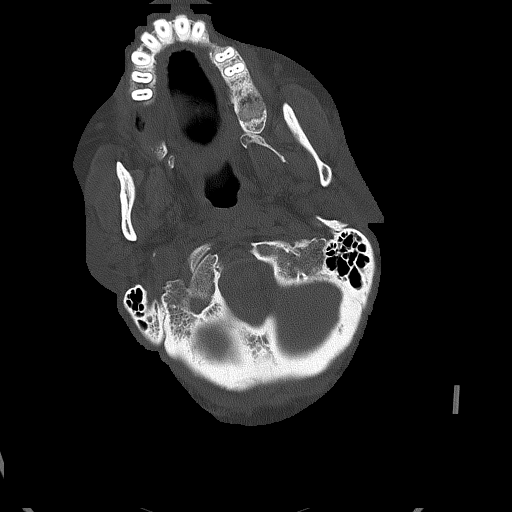
[im 19/92  bone]
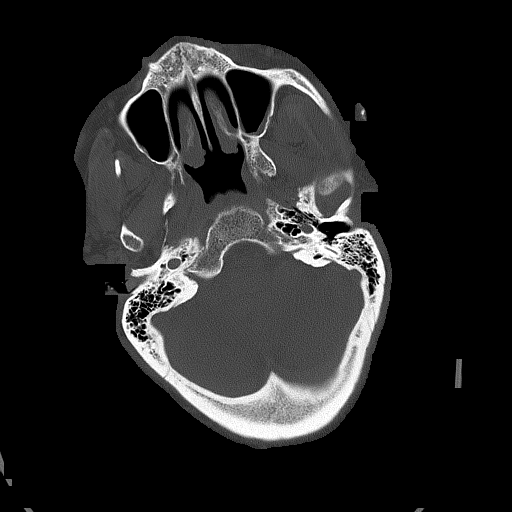
[im 28/92  bone]
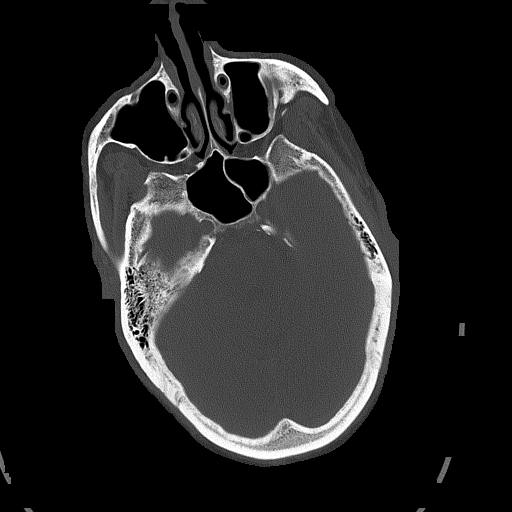
[im 41/92  bone]
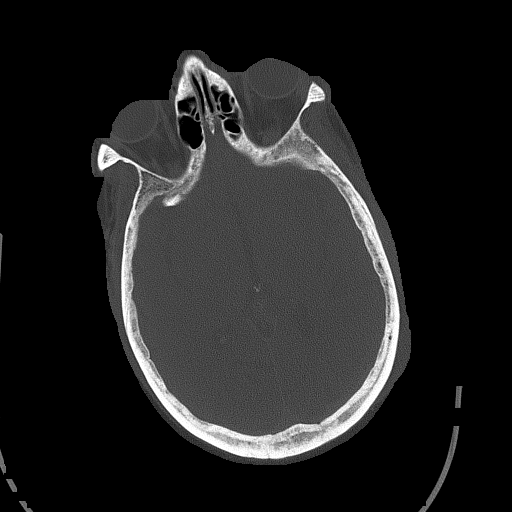

[Series 5: head without cor · coronal · non-contrast · 0.34mm/px · 3 of 71 slices shown]
[im 24/71  brain]
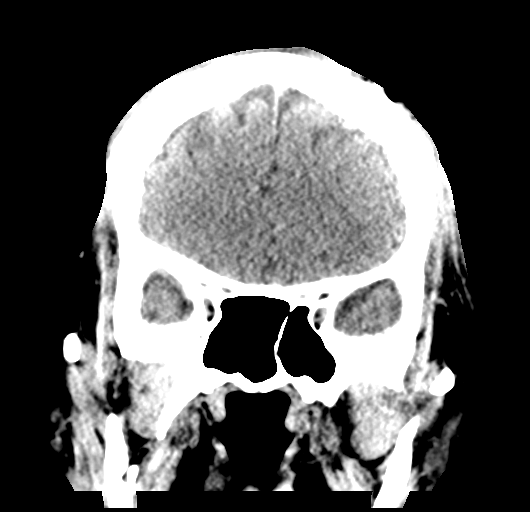
[im 32/71  brain]
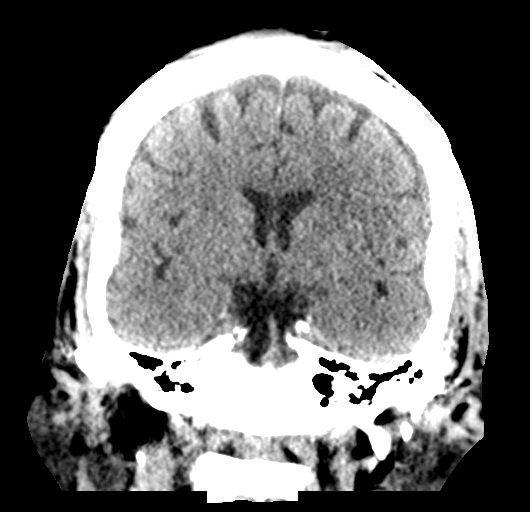
[im 39/71  brain]
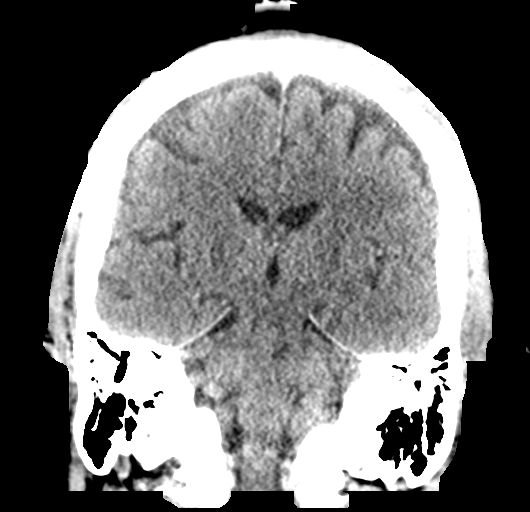

[Series 6: head without sag · sagittal · non-contrast · 0.33mm/px · 3 of 60 slices shown]
[im 20/60  brain]
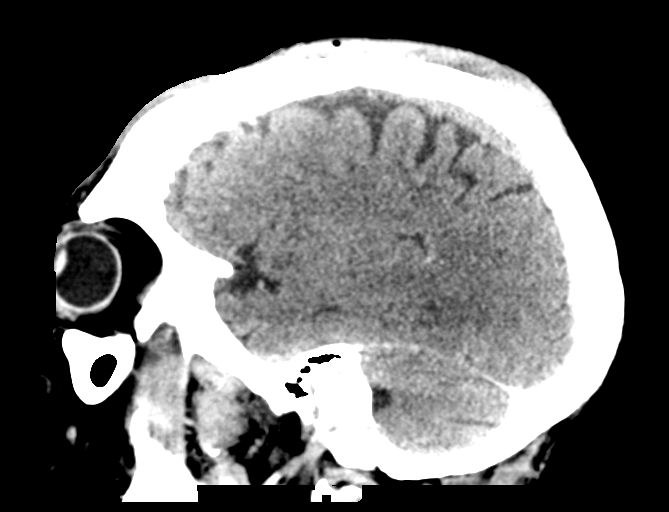
[im 30/60  brain]
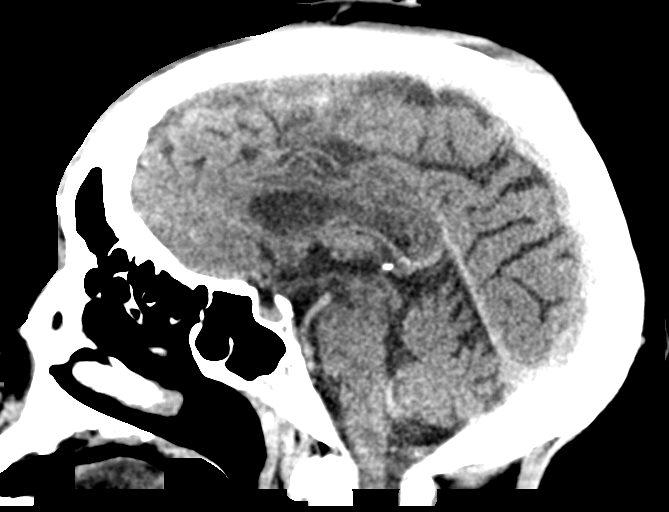
[im 40/60  brain]
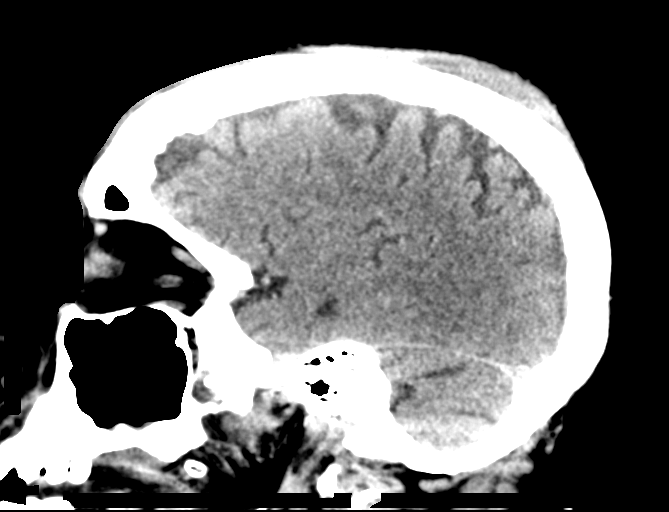

[17 of 47 positions shown; findings below may reference images not displayed]

FINDINGS: Brain: Left-sided subdural hematoma is again identified superiorly
along the convexity now measuring approximately 5 mm in thickness
relatively stable from the prior exam. No new focal area of
hemorrhage is seen. No significant midline shift is seen.

Vascular: No hyperdense vessel or unexpected calcification.

Skull: Normal. Negative for fracture or focal lesion.

Sinuses/Orbits: No acute finding.

Other: Considerable scalp hematoma is noted on the left extending
into the left periorbital region.
IMPRESSION: Stable left subdural hematoma as described.

No new focal abnormality is seen.
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
## 2022-07-09 ENCOUNTER — Ambulatory Visit: Payer: Medicaid Other | Attending: Internal Medicine | Admitting: Internal Medicine

## 2022-07-09 DIAGNOSIS — L304 Erythema intertrigo: Secondary | ICD-10-CM

## 2022-07-09 MED ORDER — NYSTATIN 100000 UNIT/GM EX POWD: 1.0000 | Freq: Two times a day (BID) | CUTANEOUS | 1 refills | Status: DC
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
## 2022-07-11 DIAGNOSIS — R32 Unspecified urinary incontinence: Secondary | ICD-10-CM | POA: Diagnosis not present

## 2022-07-17 DIAGNOSIS — Z419 Encounter for procedure for purposes other than remedying health state, unspecified: Secondary | ICD-10-CM | POA: Diagnosis not present

## 2022-07-23 ENCOUNTER — Encounter: Payer: Self-pay | Admitting: Radiology

## 2022-07-23 ENCOUNTER — Other Ambulatory Visit: Payer: Self-pay | Admitting: Internal Medicine

## 2022-07-23 DIAGNOSIS — E119 Type 2 diabetes mellitus without complications: Secondary | ICD-10-CM | POA: Diagnosis not present

## 2022-07-23 DIAGNOSIS — H25813 Combined forms of age-related cataract, bilateral: Secondary | ICD-10-CM | POA: Diagnosis not present

## 2022-07-23 DIAGNOSIS — H40023 Open angle with borderline findings, high risk, bilateral: Secondary | ICD-10-CM | POA: Diagnosis not present

## 2022-07-23 NOTE — Telephone Encounter (Signed)
Requested medication (s) are due for refill today: yes  Requested medication (s) are on the active medication list: yes  Last refill:  04/22/22  Future visit scheduled: no  Notes to clinic:  Unable to refill per protocol,in office visit needed. Patient has had several virtual appointments, routing for approval.      Requested Prescriptions  Pending Prescriptions Disp Refills   omeprazole (PRILOSEC) 20 MG capsule [Pharmacy Med Name: omeprazole 20 mg capsule,delayed release] 90 capsule 0    Sig: TAKE ONE CAPSULE BY MOUTH ONCE DAILY     Gastroenterology: Proton Pump Inhibitors Failed - 07/23/2022  2:13 PM      Failed - Valid encounter within last 12 months    Recent Outpatient Visits           2 weeks ago [place], MD   4 months ago Mixed stress and urge urinary incontinence   [place], MD   9 months ago Chronic left-sided low back pain, unspecified whether sciatica present   [place], [place]   11 months ago Chronic pain due to trauma   [facility] Ladell [place], [place]   1 year ago Essential hypertension   [place], [place], [place]

## 2022-07-31 DIAGNOSIS — R32 Unspecified urinary incontinence: Secondary | ICD-10-CM | POA: Diagnosis not present

## 2022-08-17 DIAGNOSIS — Z419 Encounter for procedure for purposes other than remedying health state, unspecified: Secondary | ICD-10-CM | POA: Diagnosis not present

## 2022-08-27 ENCOUNTER — Telehealth: Payer: Self-pay | Admitting: Internal Medicine

## 2022-08-27 NOTE — Telephone Encounter (Signed)
Outreach to patient to schedule apt with PCP to address Cognitive impairment, DM, Gerd, Mood and incontinence for [facility] care gaps. LVM. AS, CMA

## 2022-09-09 ENCOUNTER — Other Ambulatory Visit: Payer: Self-pay

## 2022-09-15 DIAGNOSIS — E119 Type 2 diabetes mellitus without complications: Secondary | ICD-10-CM | POA: Diagnosis not present

## 2022-09-15 DIAGNOSIS — H25813 Combined forms of age-related cataract, bilateral: Secondary | ICD-10-CM | POA: Diagnosis not present

## 2022-09-15 DIAGNOSIS — H40023 Open angle with borderline findings, high risk, bilateral: Secondary | ICD-10-CM | POA: Diagnosis not present

## 2022-09-16 DIAGNOSIS — Z419 Encounter for procedure for purposes other than remedying health state, unspecified: Secondary | ICD-10-CM | POA: Diagnosis not present

## 2022-10-17 DIAGNOSIS — Z419 Encounter for procedure for purposes other than remedying health state, unspecified: Secondary | ICD-10-CM | POA: Diagnosis not present

## 2022-10-19 ENCOUNTER — Ambulatory Visit: Payer: Self-pay | Admitting: *Deleted
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
## 2022-10-19 NOTE — Telephone Encounter (Signed)
Summary: back brace   Patient called requesting provider order her a metal back brace for the pain, Please f/u with pt          Reason for Disposition  [1] Caller requesting NON-URGENT health information AND [2] PCP's office is the best resource  Answer Assessment - Initial Assessment Questions 1. REASON FOR CALL or QUESTION: "What is your reason for calling today?" or "How can I best help you?" or "What question do you have that I can help answer?"     Patient is requesting medical supply: metal back brace  Protocols used: Information Only Call - No Triage-A-AH

## 2022-10-20 NOTE — Telephone Encounter (Signed)
Called & spoke to the patient. Verified name & DOB. Informed that a video visit will be needed and patient expressed verbal understanding. Appointment scheduled for 10/22/2022. Patient confirmed appointment.

## 2022-10-22 ENCOUNTER — Ambulatory Visit: Payer: Medicaid Other | Attending: Internal Medicine | Admitting: Internal Medicine

## 2022-10-22 ENCOUNTER — Encounter: Payer: Self-pay | Admitting: Internal Medicine

## 2022-10-22 ENCOUNTER — Ambulatory Visit: Payer: Medicaid Other | Admitting: Physician Assistant

## 2022-10-22 DIAGNOSIS — Z9181 History of falling: Secondary | ICD-10-CM | POA: Diagnosis not present

## 2022-10-22 DIAGNOSIS — R269 Unspecified abnormalities of gait and mobility: Secondary | ICD-10-CM | POA: Diagnosis not present

## 2022-10-22 DIAGNOSIS — M545 Low back pain, unspecified: Secondary | ICD-10-CM

## 2022-10-22 DIAGNOSIS — G8929 Other chronic pain: Secondary | ICD-10-CM
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
## 2022-10-26 ENCOUNTER — Telehealth: Payer: Self-pay

## 2022-10-26 DIAGNOSIS — R269 Unspecified abnormalities of gait and mobility: Secondary | ICD-10-CM
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
## 2022-10-28 NOTE — Telephone Encounter (Signed)
I tried to reach the patient: [phone] to inquire if she has a preference for DME companies to provide the rollator.  I also want to tell her that Dr Laural Benes will need to see her in person to discuss weight loss medication.  Message left with call back requested.

## 2022-10-29 NOTE — Telephone Encounter (Signed)
I spoke to the patient and inquired if she has a preference for DME companies and she did not, she was in agreement to sending the order to [employer] and she will request delivery of the item. Regarding the weight loss medication, I explained that Dr Laural Benes will need to see her in person to discuss that issue. The patient said she understood and will schedule an appointment after she receives her rollator.   Order for rollator faxed to [employer]

## 2022-10-30 NOTE — Telephone Encounter (Signed)
[employer] called saying it is not time for the patient to get a new walker.  She can get one in December.  CB#  [phone]

## 2022-11-16 DIAGNOSIS — Z419 Encounter for procedure for purposes other than remedying health state, unspecified: Secondary | ICD-10-CM | POA: Diagnosis not present

## 2022-11-25 ENCOUNTER — Telehealth: Payer: Self-pay

## 2022-11-25 NOTE — Telephone Encounter (Signed)
I spoke to [facility] and she explained that the order for the rollator was declined because her insurance will not cover a new one until December.    I called the patient and explained the above information.  She said she understood and will wait until December.  I also explained to her that if she brings it to this office or to the [employer] office, we can look at it to see if there is any way that the height can be raised.

## 2022-12-14 ENCOUNTER — Telehealth: Payer: Self-pay | Admitting: Internal Medicine

## 2022-12-14 NOTE — Telephone Encounter (Signed)
Pt is calling to check on the status of her [employer] paperwork. BC- [phone]

## 2022-12-14 NOTE — Telephone Encounter (Signed)
I called patient back to complete the [employer] application and had to leave a message requesting a call back

## 2022-12-15 DIAGNOSIS — R32 Unspecified urinary incontinence: Secondary | ICD-10-CM | POA: Diagnosis not present

## 2022-12-16 NOTE — Telephone Encounter (Signed)
I spoke to the patient and completed the [employer] [place] application.  She said this is her first time applying for the service.  The completed application was then sent via [employer]  to [employer] - Lorretta Harp

## 2022-12-17 DIAGNOSIS — Z419 Encounter for procedure for purposes other than remedying health state, unspecified: Secondary | ICD-10-CM | POA: Diagnosis not present

## 2023-01-05 ENCOUNTER — Telehealth: Payer: Self-pay

## 2023-01-05 NOTE — Telephone Encounter (Signed)
Per Lorretta Harp, [employer], all paperwork for the application has been received.

## 2023-01-11 ENCOUNTER — Ambulatory Visit: Payer: Self-pay

## 2023-01-11 NOTE — Telephone Encounter (Signed)
Schedule a virtual appt with Georgian Co, PA-C Thurs. At 1050.   Advised if sx's progress to go to ED.   Patient verbalized understanding.
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
## 2023-01-13 ENCOUNTER — Telehealth (INDEPENDENT_AMBULATORY_CARE_PROVIDER_SITE_OTHER): Payer: Self-pay | Admitting: Internal Medicine
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
## 2023-01-13 NOTE — Telephone Encounter (Signed)
Called & spoke to the patient. Verified name & DOB. Patient was triaged and appointment has been scheduled for 01/14/2023 at [facility]. Patient confirmed appointment.

## 2023-01-14 ENCOUNTER — Ambulatory Visit: Payer: Self-pay | Admitting: *Deleted

## 2023-01-14 ENCOUNTER — Telehealth (INDEPENDENT_AMBULATORY_CARE_PROVIDER_SITE_OTHER): Payer: Self-pay | Admitting: Internal Medicine

## 2023-01-14 ENCOUNTER — Telehealth: Payer: Self-pay

## 2023-01-14 ENCOUNTER — Encounter: Payer: Self-pay | Admitting: Physician Assistant

## 2023-01-14 ENCOUNTER — Ambulatory Visit: Payer: Medicaid Other | Attending: Physician Assistant | Admitting: Physician Assistant

## 2023-01-14 VITALS — BP 145/93 | HR 86 | Ht 67.0 in | Wt 242.0 lb

## 2023-01-14 DIAGNOSIS — Z13220 Encounter for screening for lipoid disorders: Secondary | ICD-10-CM | POA: Diagnosis not present

## 2023-01-14 DIAGNOSIS — G8921 Chronic pain due to trauma: Secondary | ICD-10-CM | POA: Diagnosis not present

## 2023-01-14 DIAGNOSIS — F419 Anxiety disorder, unspecified: Secondary | ICD-10-CM

## 2023-01-14 DIAGNOSIS — M545 Low back pain, unspecified: Secondary | ICD-10-CM | POA: Diagnosis not present

## 2023-01-14 DIAGNOSIS — Z23 Encounter for immunization: Secondary | ICD-10-CM

## 2023-01-14 DIAGNOSIS — Z8639 Personal history of other endocrine, nutritional and metabolic disease: Secondary | ICD-10-CM | POA: Diagnosis not present

## 2023-01-14 DIAGNOSIS — G8929 Other chronic pain: Secondary | ICD-10-CM | POA: Diagnosis not present

## 2023-01-14 DIAGNOSIS — I10 Essential (primary) hypertension: Secondary | ICD-10-CM | POA: Diagnosis not present

## 2023-01-14 DIAGNOSIS — Z7689 Persons encountering health services in other specified circumstances: Secondary | ICD-10-CM | POA: Diagnosis not present

## 2023-01-14 DIAGNOSIS — R269 Unspecified abnormalities of gait and mobility: Secondary | ICD-10-CM

## 2023-01-14 MED ORDER — AMLODIPINE BESYLATE 10 MG PO TABS: 10.0000 mg | ORAL_TABLET | Freq: Every day | ORAL | 1 refills | Status: DC

## 2023-01-14 MED ORDER — CYCLOBENZAPRINE HCL 5 MG PO TABS: 5.0000 mg | ORAL_TABLET | Freq: Every day | ORAL | 1 refills | Status: DC

## 2023-01-14 MED ORDER — CELECOXIB 200 MG PO CAPS: 200.0000 mg | ORAL_CAPSULE | Freq: Every day | ORAL | 1 refills | Status: DC

## 2023-01-14 MED ORDER — DULOXETINE HCL 30 MG PO CPEP: 30.0000 mg | ORAL_CAPSULE | Freq: Every morning | ORAL | 1 refills | Status: DC

## 2023-01-14 NOTE — Telephone Encounter (Signed)
Copied from CRM [id number]. Topic: Transportation - Transportation >> Jan 13, 2023 10:10 AM Lennox Pippins wrote: Patient called and stated her lawyer got her a ride for tomorrow, but she does not have assistance getting to appointment or help get in and out of car. She stated the [employer] drivers are not nice or helpful for her getting her to her appointment. She stated she can not fold her chair up and get in the car with walker. Please advise. Patient is requesting a Zenaida Niece transportation, if possible, stated she can not get in a small car. Please advise and call patient back with update.Patient really does not want to miss appointment.   Patient callback # [phone]

## 2023-01-14 NOTE — Telephone Encounter (Signed)
Pt is needing transportation set up   Copied from CRM [id number]. Topic: General - Inquiry >> Jan 14, 2023  2:45 PM Yolanda T wrote: Reason for CRM: patient has an appt on 9/18 ast 10:50 and need someone to schedule transportation for her and to also advise her attorney of the appt. Please f/u with patient
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
## 2023-01-14 NOTE — Patient Instructions (Addendum)
You will need to call [employer] @ [phone] and ask for the paper work to initiate the process.  Once you have received the paperwork, schedule an appointment with Dr Laural Benes

## 2023-01-14 NOTE — Telephone Encounter (Signed)
  Chief Complaint: recent falls- patient calling about mobility help- she was seen at office today Symptoms: off balance- frequent falling Frequency: last fall Saturday with injury  Disposition: [] ED /[] Urgent Care (no appt availability in office) / [] Appointment(In office/virtual)/ []  [facility]/ [] Home Care/ [] Refused Recommended Disposition /[] [facility]/ [x]  Follow-up with PCP Additional Notes: After discussion with patient- she has actually called the wrong number-she had been seen is office today and she was trying to contact [employer]. Called the number for her and she was connected to them for assistance. Patient advised keep folllow up appointment and call office if she needs anything else.
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
## 2023-01-14 NOTE — Telephone Encounter (Signed)
Noted  

## 2023-01-14 NOTE — Telephone Encounter (Signed)
Noted.  Patient arrived at her appointment today

## 2023-01-15 LAB — COMPREHENSIVE METABOLIC PANEL
ALT: 31 IU/L (ref 0–32)
AST: 128 IU/L — ABNORMAL HIGH (ref 0–40)
Albumin: 3.6 g/dL — ABNORMAL LOW (ref 3.8–4.9)
Alkaline Phosphatase: 104 IU/L (ref 44–121)
BUN/Creatinine Ratio: 10 (ref 9–23)
BUN: 8 mg/dL (ref 6–24)
Bilirubin Total: 0.6 mg/dL (ref 0.0–1.2)
CO2: 18 mmol/L — ABNORMAL LOW (ref 20–29)
Calcium: 8.7 mg/dL (ref 8.7–10.2)
Chloride: 101 mmol/L (ref 96–106)
Creatinine, Ser: 0.81 mg/dL (ref 0.57–1.00)
Globulin, Total: 4.8 g/dL — ABNORMAL HIGH (ref 1.5–4.5)
Glucose: 97 mg/dL (ref 70–99)
Potassium: 3.7 mmol/L (ref 3.5–5.2)
Sodium: 137 mmol/L (ref 134–144)
Total Protein: 8.4 g/dL (ref 6.0–8.5)
eGFR: 87 mL/min/{1.73_m2} (ref >59)

## 2023-01-15 LAB — LIPID PANEL
Chol/HDL Ratio: 3 ratio (ref 0.0–4.4)
Cholesterol, Total: 202 mg/dL — ABNORMAL HIGH (ref 100–199)
HDL: 68 mg/dL (ref >39)
LDL Chol Calc (NIH): 116 mg/dL — ABNORMAL HIGH (ref 0–99)
Triglycerides: 104 mg/dL (ref 0–149)
VLDL Cholesterol Cal: 18 mg/dL (ref 5–40)

## 2023-01-15 LAB — HEMOGLOBIN A1C
Est. average glucose Bld gHb Est-mCnc: 91 mg/dL
Hgb A1c MFr Bld: 4.8 % (ref 4.8–5.6)

## 2023-01-17 DIAGNOSIS — Z419 Encounter for procedure for purposes other than remedying health state, unspecified: Secondary | ICD-10-CM | POA: Diagnosis not present

## 2023-01-19 ENCOUNTER — Telehealth: Payer: Self-pay

## 2023-01-19 ENCOUNTER — Other Ambulatory Visit: Payer: Self-pay | Admitting: Internal Medicine

## 2023-01-19 DIAGNOSIS — M545 Low back pain, unspecified: Secondary | ICD-10-CM

## 2023-01-19 DIAGNOSIS — F419 Anxiety disorder, unspecified: Secondary | ICD-10-CM

## 2023-01-19 DIAGNOSIS — G8921 Chronic pain due to trauma: Secondary | ICD-10-CM

## 2023-01-19 NOTE — Telephone Encounter (Signed)
-----   Message from Georgian Co sent at 01/15/2023  2:47 PM EDT ----- Please call patient. Liver function is elevated. Avoiding drinking any form of alcohol or taking products containing Tylenol. Cholesterol is a little high-follow heart healthy diet. No diabetes. Thanks, angela mcclung, pa-c

## 2023-01-19 NOTE — Telephone Encounter (Signed)
Pt was called and is aware of results, DOB was confirmed.  ?

## 2023-01-19 NOTE — Telephone Encounter (Signed)
Medication Refill - Medication:   Generic Flexeril 5 mg  Has the patient contacted their pharmacy? Yes.   (Agent: If no, request that the patient contact the pharmacy for the refill. If patient does not wish to contact the pharmacy document the reason why and proceed with request.) (Agent: If yes, when and what did the pharmacy advise?)  Preferred Pharmacy (with phone number or street name): [facility]  Has the patient been seen for an appointment in the last year OR does the patient have an upcoming appointment? Yes.    Agent: Please be advised that RX refills may take up to 3 business days. We ask that you follow-up with your pharmacy.

## 2023-01-20 NOTE — Telephone Encounter (Signed)
Call returned to patient  [phone] , message left with call back requested.

## 2023-01-21 ENCOUNTER — Telehealth: Payer: Self-pay

## 2023-01-21 DIAGNOSIS — M545 Low back pain, unspecified: Secondary | ICD-10-CM

## 2023-01-21 NOTE — Telephone Encounter (Signed)
I spoke to the patient regarding her transportation to medical appointments. She said she has tried to contact her insurance company to schedule rides but is never able to get through to anyone. She was in agreement to placing a referral to Managed Medicaid Care Management for assistance.  Referral then placed as discussed.  She then said she is interested in a power wheelchair or a mobility scooter. She has contacted [employer] but is not sure of the next steps.  I told her that I would check with Dr Laural Benes to see if she wants to refer her for a PT seating eval first and the patient was in agreement.   She currently rents her apartment and I explained to her that she will need to check with the landlord about installing a ramp because the insurance will not pay for a ramp.  She said she understood but said she only has 1 step into the apartment and can get the scooter/ chair in her home.  I also explained that the purpose of the equipment is for mobility indoors.Marland Kitchen it can be used outdoors but its primary use needs to be indoors.

## 2023-01-21 NOTE — Telephone Encounter (Signed)
I spoke to the patient today and that is documented in another telephone encounter today

## 2023-01-21 NOTE — Telephone Encounter (Signed)
Requested medication (s) are due for refill today: No  Requested medication (s) are on the active medication list: Yes  Last refill:  01/14/23  Future visit scheduled: Yes  Notes to clinic:  Not delegated.    Requested Prescriptions  Pending Prescriptions Disp Refills   cyclobenzaprine (FLEXERIL) 5 MG tablet 30 tablet 1    Sig: Take 1 tablet (5 mg total) by mouth at bedtime. For muscle pain     Not Delegated - Analgesics:  Muscle Relaxants Failed - 01/19/2023  4:16 PM      Failed - This refill cannot be delegated      Passed - Valid encounter within last 6 months    Recent Outpatient Visits           1 week ago History of diabetes mellitus   [place] [facility] [place], [place], [place]   3 months ago Gait disturbance   [place] [facility] Marcine Matar, MD   6 months ago Intertrigo   [facility] Marcine Matar, MD   10 months ago Mixed stress and urge urinary incontinence   [place] [facility] Marcine Matar, MD   1 year ago Chronic left-sided low back pain, unspecified whether sciatica present   [facility] Marcine Matar, MD       Future Appointments             In 1 week Sharon Seller, [place] [facility]   In 2 months Laural Benes, Binnie Rail, MD [facility]

## 2023-01-21 NOTE — Telephone Encounter (Signed)
I called the patient and explained to her that Dr Laural Benes wants to see her in person to assess her and discuss the request for the mobility device. She said she understood and I scheduled her with Dr Laural Benes - 10/1/204.

## 2023-01-29 ENCOUNTER — Other Ambulatory Visit: Payer: Medicaid Other
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
## 2023-01-29 NOTE — Patient Instructions (Signed)
Visit Information  Elizabeth Bush was given information about Medicaid Managed Care team care coordination services as a part of their [facility] Medicaid benefit. Elizabeth Bush verbally consented to engagement with the [facility] Managed Care team.   If you are experiencing a medical emergency, please call 911 or report to your local emergency department or urgent care.   If you have a non-emergency medical problem during routine business hours, please contact your provider's office and ask to speak with a nurse.   For questions related to your [facility] health plan, please call: [phone] or go here:[email or web address]  If you would like to schedule transportation through your [facility] plan, please call the following number at least 2 days in advance of your appointment: [phone].   You can also use the MTM portal or MTM mobile app to manage your rides. Reimbursement for transportation is available through [facility]! For the portal, please go to [email or web address]  Call the [facility] Crisis Line at [phone], at any time, 24 hours a day, 7 days a week. If you are in danger or need immediate medical attention call 911.  If you would like help to quit smoking, call 1-800-QUIT-NOW (3105465414) OR Espaol: 1-855-Djelo-Ya (4-010-272-5366) o para ms informacin haga clic aqu or Text READY to 440-347 to register via text  Ms. Bonillas - following are the goals we discussed in your visit today:   Goals Addressed   None      Social Worker will follow up on 02/10/23.   Gus Puma, Kenard Gower, MHA [facility] Health  Managed Medicaid Social Worker [phone]   Following is a copy of your plan of care:  There are no care plans that you recently modified to display for this patient.

## 2023-02-03 ENCOUNTER — Ambulatory Visit: Payer: Medicaid Other | Admitting: Physician Assistant

## 2023-02-10 ENCOUNTER — Other Ambulatory Visit: Payer: Medicaid Other

## 2023-02-10 NOTE — Patient Outreach (Signed)
Medicaid Managed Care   Unsuccessful Outreach Note  02/10/2023 Name: Elizabeth Bush MRN: [id number] DOB: 1970-10-30  Referred by: Marcine Matar, MD Reason for referral : High Risk Managed Medicaid (MM social work unsuccessful telephone outreach )   An unsuccessful telephone outreach was attempted today. The patient was referred to the case management team for assistance with care management and care coordination.   Follow Up Plan: A HIPAA compliant phone message was left for the patient providing contact information and requesting a return call.   Abelino Derrick, MHA [facility] Health  Managed [facility] Social Worker [phone]

## 2023-02-10 NOTE — Patient Instructions (Signed)
Medicaid Managed Care   Unsuccessful Outreach Note  02/10/2023 Name: Elizabeth Bush MRN: [id number] DOB: 1970-10-30  Referred by: Marcine Matar, MD Reason for referral : High Risk Managed Medicaid (MM social work unsuccessful telephone outreach )   An unsuccessful telephone outreach was attempted today. The patient was referred to the case management team for assistance with care management and care coordination.   Follow Up Plan: A HIPAA compliant phone message was left for the patient providing contact information and requesting a return call.   Abelino Derrick, MHA [facility] Health  Managed [facility] Social Worker [phone]

## 2023-02-16 ENCOUNTER — Ambulatory Visit: Payer: Medicaid Other | Attending: Internal Medicine | Admitting: Internal Medicine

## 2023-02-16 ENCOUNTER — Other Ambulatory Visit (HOSPITAL_COMMUNITY): Payer: Self-pay

## 2023-02-16 ENCOUNTER — Encounter: Payer: Self-pay | Admitting: Internal Medicine

## 2023-02-16 ENCOUNTER — Encounter: Payer: Self-pay | Admitting: Hematology

## 2023-02-16 VITALS — BP 120/83 | HR 86 | Temp 97.8°F | Ht 67.0 in | Wt 249.0 lb

## 2023-02-16 DIAGNOSIS — L309 Dermatitis, unspecified: Secondary | ICD-10-CM

## 2023-02-16 DIAGNOSIS — K649 Unspecified hemorrhoids: Secondary | ICD-10-CM

## 2023-02-16 DIAGNOSIS — F331 Major depressive disorder, recurrent, moderate: Secondary | ICD-10-CM

## 2023-02-16 DIAGNOSIS — R040 Epistaxis: Secondary | ICD-10-CM

## 2023-02-16 DIAGNOSIS — I10 Essential (primary) hypertension: Secondary | ICD-10-CM | POA: Diagnosis not present

## 2023-02-16 DIAGNOSIS — Z419 Encounter for procedure for purposes other than remedying health state, unspecified: Secondary | ICD-10-CM | POA: Diagnosis not present

## 2023-02-16 DIAGNOSIS — R269 Unspecified abnormalities of gait and mobility: Secondary | ICD-10-CM

## 2023-02-16 DIAGNOSIS — K625 Hemorrhage of anus and rectum: Secondary | ICD-10-CM

## 2023-02-16 MED ORDER — POLYETHYLENE GLYCOL 3350 17 GM/SCOOP PO POWD
17.0000 g | Freq: Every day | ORAL | 1 refills | Status: DC | PRN
Start: 2023-02-16 — End: 2023-06-20
  Filled 2023-02-16: qty 476, 28d supply, fill #0
  Filled 2023-02-18: qty 238, 14d supply, fill #0

## 2023-02-16 MED ORDER — HYDROCORTISONE (PERIANAL) 2.5 % EX CREA
1.0000 | TOPICAL_CREAM | Freq: Two times a day (BID) | CUTANEOUS | 0 refills | Status: DC
  Filled 2023-02-16 – 2023-02-18 (×2): qty 30, 10d supply, fill #0

## 2023-02-16 MED ORDER — TRIAMCINOLONE ACETONIDE 0.1 % EX CREA
1.0000 | TOPICAL_CREAM | Freq: Two times a day (BID) | CUTANEOUS | 0 refills | Status: DC
Start: 2023-02-16 — End: 2023-06-27
  Filled 2023-02-16 – 2023-02-18 (×2): qty 30, 15d supply, fill #0
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
## 2023-02-17 ENCOUNTER — Other Ambulatory Visit (HOSPITAL_COMMUNITY): Payer: Self-pay

## 2023-02-17 ENCOUNTER — Encounter: Payer: Self-pay | Admitting: Hematology

## 2023-02-17 ENCOUNTER — Encounter: Payer: Self-pay | Admitting: Internal Medicine

## 2023-02-17 ENCOUNTER — Other Ambulatory Visit: Payer: Self-pay | Admitting: Internal Medicine

## 2023-02-17 LAB — CBC
Hematocrit: 30.8 % — ABNORMAL LOW (ref 34.0–46.6)
Hemoglobin: 10 g/dL — ABNORMAL LOW (ref 11.1–15.9)
MCH: 31.3 pg (ref 26.6–33.0)
MCHC: 32.5 g/dL (ref 31.5–35.7)
MCV: 96 fL (ref 79–97)
RBC: 3.2 x10E6/uL — ABNORMAL LOW (ref 3.77–5.28)
RDW: 12.1 % (ref 11.7–15.4)
WBC: 4.9 10*3/uL (ref 3.4–10.8)

## 2023-02-17 MED ORDER — FERROUS SULFATE 325 (65 FE) MG PO TABS
325.0000 mg | ORAL_TABLET | Freq: Every day | ORAL | 0 refills | Status: DC
Start: 2023-02-17 — End: 2023-06-27
  Filled 2023-02-17 – 2023-02-18 (×2): qty 100, 100d supply, fill #0

## 2023-02-18 ENCOUNTER — Encounter: Payer: Self-pay | Admitting: Hematology

## 2023-02-18 ENCOUNTER — Other Ambulatory Visit (HOSPITAL_COMMUNITY): Payer: Self-pay

## 2023-02-18 ENCOUNTER — Other Ambulatory Visit: Payer: Self-pay

## 2023-02-22 ENCOUNTER — Other Ambulatory Visit (HOSPITAL_BASED_OUTPATIENT_CLINIC_OR_DEPARTMENT_OTHER): Payer: Self-pay

## 2023-02-22 ENCOUNTER — Other Ambulatory Visit (HOSPITAL_COMMUNITY): Payer: Self-pay

## 2023-02-23 ENCOUNTER — Telehealth: Payer: Self-pay | Admitting: Internal Medicine

## 2023-02-23 ENCOUNTER — Other Ambulatory Visit (HOSPITAL_COMMUNITY): Payer: Self-pay

## 2023-02-23 NOTE — Telephone Encounter (Signed)
Pt is calling in because she has a discount card from [facility] to help with her prescriptions but it can only be used at [employer] or [employer]. Pt is requesting the refills be transferred over to [employer] on [employer] ferrous sulfate 325 (65 FE) MG tablet [[id number]] hydrocortisone (ANUSOL-HC) 2.5 % rectal cream [[id number]] polyethylene glycol powder (GLYCOLAX/MIRALAX) 17 GM/SCOOP powder [[id number]] triamcinolone cream (KENALOG) 0.1 % [[id number]] pt is requesting a call back when the medication has been transferred,

## 2023-02-23 NOTE — Telephone Encounter (Signed)
[employer] account planner  called spoke with Pharmacist they will call [facility]  and request medication transfer and will call patient to let her know when medication are ready for pick up.

## 2023-02-24 ENCOUNTER — Other Ambulatory Visit: Payer: Self-pay

## 2023-02-26 ENCOUNTER — Other Ambulatory Visit: Payer: Self-pay | Admitting: Internal Medicine

## 2023-02-26 ENCOUNTER — Other Ambulatory Visit (HOSPITAL_COMMUNITY): Payer: Self-pay

## 2023-02-26 DIAGNOSIS — I10 Essential (primary) hypertension: Secondary | ICD-10-CM

## 2023-02-26 MED ORDER — AMLODIPINE BESYLATE 10 MG PO TABS: 10.0000 mg | ORAL_TABLET | Freq: Every day | ORAL | 1 refills | Status: DC

## 2023-02-26 NOTE — Telephone Encounter (Signed)
Medication Refill - Medication: amLODipine (NORVASC) 10 MG tablet  Pt is out of this med  Has the patient contacted their pharmacy? yes (Agent: If yes, when and what did the pharmacy advise?)contact pcp  Preferred Pharmacy (with phone number or street name): [facility] [address] [place], [place] [address] [phone]  Has the patient been seen for an appointment in the last year OR does the patient have an upcoming appointment? yes  Agent: Please be advised that RX refills may take up to 3 business days. We ask that you follow-up with your pharmacy.

## 2023-02-26 NOTE — Telephone Encounter (Signed)
Requested Prescriptions  Pending Prescriptions Disp Refills   amLODipine (NORVASC) 10 MG tablet 90 tablet 1    Sig: Take 1 tablet (10 mg total) by mouth daily. For high blood pressure     Cardiovascular: Calcium Channel Blockers 2 Passed - 02/26/2023 12:17 PM      Passed - Last BP in normal range    BP Readings from Last 1 Encounters:  02/16/23 120/83         Passed - Last Heart Rate in normal range    Pulse Readings from Last 1 Encounters:  02/16/23 86         Passed - Valid encounter within last 6 months    Recent Outpatient Visits           1 week ago Epistaxis   [facility] Marcine Matar, MD   1 month ago History of diabetes mellitus   [place] [facility] [place], [place], [place]   4 months ago Gait disturbance   [facility] Marcine Matar, MD   7 months ago Intertrigo   [facility] Marcine Matar, MD   11 months ago Mixed stress and urge urinary incontinence   [place] [facility] Marcine Matar, MD       Future Appointments             In 1 month Laural Benes, Binnie Rail, MD [facility]

## 2023-03-01 ENCOUNTER — Other Ambulatory Visit (HOSPITAL_COMMUNITY): Payer: Self-pay

## 2023-03-04 ENCOUNTER — Telehealth: Payer: Self-pay | Admitting: Internal Medicine

## 2023-03-04 NOTE — Telephone Encounter (Signed)
Will follow up with PCP.

## 2023-03-04 NOTE — Telephone Encounter (Signed)
Patient called and states she saw Dr Elizabeth Bush on 10.1.2024 and asked for a hoover round, however patient states she can not really use that around her apartment. Patient states she really needs a steel neck & back brace and her lawyer is requesting approval/permission from PCP, Dr Elizabeth Bush, in order for her to get this steel neck and back brace. Patient states she can not stand up good and needs this for that reason. Please advise and follow back up with patient @ # [phone].

## 2023-03-05 ENCOUNTER — Telehealth: Payer: Self-pay | Admitting: Physical Medicine and Rehabilitation

## 2023-03-05 NOTE — Telephone Encounter (Signed)
Spoke with patient. Per PCP patient Advised to contact her Orthopedic specialist for assistance with her request.

## 2023-03-05 NOTE — Telephone Encounter (Signed)
Patient called needing to schedule an appointment with Dr. Alvester Morin. The number to contact patient is [phone]

## 2023-03-08 NOTE — Telephone Encounter (Signed)
Patient is having back pain. Last injection was 10/2021. OV scheduled for 03/11/23

## 2023-03-11 ENCOUNTER — Ambulatory Visit (INDEPENDENT_AMBULATORY_CARE_PROVIDER_SITE_OTHER): Payer: Medicaid Other | Admitting: Physical Medicine and Rehabilitation

## 2023-03-11 DIAGNOSIS — M5442 Lumbago with sciatica, left side: Secondary | ICD-10-CM | POA: Diagnosis not present

## 2023-03-11 DIAGNOSIS — M5416 Radiculopathy, lumbar region: Secondary | ICD-10-CM | POA: Diagnosis not present

## 2023-03-11 DIAGNOSIS — M5441 Lumbago with sciatica, right side: Secondary | ICD-10-CM | POA: Diagnosis not present

## 2023-03-11 DIAGNOSIS — G8929 Other chronic pain: Secondary | ICD-10-CM

## 2023-03-11 DIAGNOSIS — G894 Chronic pain syndrome: Secondary | ICD-10-CM | POA: Diagnosis not present

## 2023-03-11 DIAGNOSIS — Z7689 Persons encountering health services in other specified circumstances: Secondary | ICD-10-CM | POA: Diagnosis not present
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
## 2023-03-17 ENCOUNTER — Encounter: Payer: Self-pay | Admitting: Physical Medicine and Rehabilitation

## 2023-03-19 DIAGNOSIS — Z419 Encounter for procedure for purposes other than remedying health state, unspecified: Secondary | ICD-10-CM | POA: Diagnosis not present

## 2023-03-25 ENCOUNTER — Ambulatory Visit: Payer: Medicaid Other | Admitting: Physical Medicine and Rehabilitation

## 2023-03-25 ENCOUNTER — Other Ambulatory Visit: Payer: Self-pay

## 2023-03-25 ENCOUNTER — Encounter: Payer: Self-pay | Admitting: Physical Medicine and Rehabilitation

## 2023-03-25 VITALS — BP 126/87 | HR 96

## 2023-03-25 DIAGNOSIS — M5416 Radiculopathy, lumbar region: Secondary | ICD-10-CM

## 2023-03-25 MED ORDER — METHYLPREDNISOLONE ACETATE 40 MG/ML IJ SUSP
40.0000 mg | Freq: Once | INTRAMUSCULAR | Status: AC
Start: 2023-03-25 — End: 2023-03-25
  Administered 2023-03-25: 40 mg

## 2023-03-25 NOTE — Progress Notes (Signed)
Elizabeth Bush - 52 y.o. female MRN [id number]  Date of birth: [birth year]  Office Visit Note: Visit Date: 03/25/2023 PCP: Marcine Matar, MD Referred by: Marcine Matar, MD  Subjective: Chief Complaint  Patient presents with   Lower Back - Pain   HPI:  Elizabeth Bush is a 52 y.o. female who comes in today at the request of Ellin Goodie, FNP for planned Left L4-5 Lumbar Interlaminar epidural steroid injection with fluoroscopic guidance.  The patient has failed conservative care including home exercise, medications, time and activity modification.  This injection will be diagnostic and hopefully therapeutic.  Please see requesting physician notes for further details and justification.   ROS Otherwise per HPI.  Assessment & Plan: Visit Diagnoses:    ICD-10-CM   1. Lumbar radiculopathy  M54.16 XR C-ARM NO REPORT    Epidural Steroid injection    methylPREDNISolone acetate (DEPO-MEDROL) injection 40 mg      Plan: No additional findings.   Meds & Orders:  Meds ordered this encounter  Medications   methylPREDNISolone acetate (DEPO-MEDROL) injection 40 mg    Orders Placed This Encounter  Procedures   XR C-ARM NO REPORT   Epidural Steroid injection    Follow-up: Return for visit to requesting provider as needed.   Procedures: No procedures performed  Lumbar Epidural Steroid Injection - Interlaminar Approach with Fluoroscopic Guidance  Patient: Elizabeth Bush      Date of Birth: 06-29-1970 MRN: [id number] PCP: Marcine Matar, MD      Visit Date: 03/25/2023   Universal Protocol:     Consent Given By: the patient  Position: PRONE  Additional Comments: Vital signs were monitored before and after the procedure. Patient was prepped and draped in the usual sterile fashion. The correct patient, procedure, and site was verified.   Injection Procedure Details:   Procedure diagnoses: Lumbar radiculopathy [M54.16]   Meds Administered:  Meds ordered this encounter   Medications   methylPREDNISolone acetate (DEPO-MEDROL) injection 40 mg     Laterality: Left  Location/Site:  L4-5  Needle: 4.5 in., 20 ga. Tuohy  Needle Placement: Paramedian epidural  Findings:   -Comments: Excellent flow of contrast into the epidural space.  Procedure Details: Using a paramedian approach from the side mentioned above, the region overlying the inferior lamina was localized under fluoroscopic visualization and the soft tissues overlying this structure were infiltrated with 4 ml. of 1% Lidocaine without Epinephrine. The Tuohy needle was inserted into the epidural space using a paramedian approach.   The epidural space was localized using loss of resistance along with counter oblique bi-planar fluoroscopic views.  After negative aspirate for air, blood, and CSF, a 2 ml. volume of Isovue-250 was injected into the epidural space and the flow of contrast was observed. Radiographs were obtained for documentation purposes.    The injectate was administered into the level noted above.   Additional Comments:  No complications occurred Dressing: 2 x 2 sterile gauze and Band-Aid    Post-procedure details: Patient was observed during the procedure. Post-procedure instructions were reviewed.  Patient left the clinic in stable condition.   Clinical History: EXAM: MRI LUMBAR SPINE WITHOUT CONTRAST   TECHNIQUE: Multiplanar, multisequence MR imaging of the lumbar spine was performed. No intravenous contrast was administered.   COMPARISON:  None.   FINDINGS: Segmentation:  Standard.   Alignment:  Grade 1 anterolisthesis of L3 on L4 and L4 on L5.   Vertebrae: No acute fracture, evidence of discitis, or aggressive  bone lesion.   Conus medullaris and cauda equina: Conus extends to the T12 level. Conus and cauda equina appear normal.   Paraspinal and other soft tissues: No acute paraspinal abnormality. Small right renal cyst.   Other: Mild osteoarthritis of  bilateral SI joints.   Disc levels:   Disc spaces: Degenerative disease with disc desiccation and mild disc height loss at L3-4 and L4-5. Disc desiccation at L2-3   T12-L1: No significant disc bulge. No neural foraminal stenosis. No central canal stenosis.   L1-L2: No significant disc bulge. No neural foraminal stenosis. No central canal stenosis. Mild bilateral facet arthropathy.   L2-L3: No significant disc bulge. No neural foraminal stenosis. No central canal stenosis. Mild broad-based disc bulge. Moderate bilateral facet arthropathy. Moderate left foraminal stenosis. No right foraminal stenosis. No spinal stenosis. Prominence of the epidural fat as can be seen with epidural lipomatosis.   L3-L4: Broad-based disc bulge. Severe bilateral facet arthropathy. Mild spinal stenosis. Mild left foraminal stenosis. Mild right foraminal stenosis. Prominence of the epidural fat deforming the thecal sac as can be seen with epidural lipomatosis.   L4-L5: Broad-based disc bulge. Severe bilateral facet arthropathy. Moderate spinal stenosis. Prominence of the epidural fat deforming the thecal sac as can be seen with epidural lipomatosis. Mild right and moderate left foraminal stenosis.   L5-S1: No disc protrusion. Moderate bilateral facet arthropathy. No foraminal stenosis. No spinal stenosis.   IMPRESSION: 1.  Diffuse lumbar spine spondylosis as described above. 2. No acute osseous injury of the lumbar spine.     Electronically Signed   By: Elige Ko M.D.   On: 08/07/2021 16:13     Objective:  VS:  HT:    WT:   BMI:     BP:126/87  HR:96bpm  TEMP: ( )  RESP:  Physical Exam Vitals and nursing note reviewed.  Constitutional:      General: She is not in acute distress.    Appearance: Normal appearance. She is obese. She is not ill-appearing.  HENT:     Head: Normocephalic and atraumatic.     Right Ear: External ear normal.     Left Ear: External ear normal.  Eyes:      Extraocular Movements: Extraocular movements intact.  Cardiovascular:     Rate and Rhythm: Normal rate.     Pulses: Normal pulses.  Pulmonary:     Effort: Pulmonary effort is normal. No respiratory distress.  Abdominal:     General: There is no distension.     Palpations: Abdomen is soft.  Musculoskeletal:        General: Tenderness present.     Cervical back: Neck supple.     Right lower leg: No edema.     Left lower leg: No edema.     Comments: Patient has good distal strength with no pain over the greater trochanters.  No clonus or focal weakness. Ambulates with Walker.  Skin:    Findings: No erythema, lesion or rash.  Neurological:     General: No focal deficit present.     Mental Status: She is alert and oriented to person, place, and time.     Cranial Nerves: No cranial nerve deficit.     Sensory: No sensory deficit.     Motor: No weakness or abnormal muscle tone.     Coordination: Coordination normal.     Gait: Gait abnormal.  Psychiatric:        Mood and Affect: Mood normal.  Behavior: Behavior normal.      Imaging: No results found.

## 2023-03-25 NOTE — Procedures (Signed)
Lumbar Epidural Steroid Injection - Interlaminar Approach with Fluoroscopic Guidance  Patient: Elizabeth Bush      Date of Birth: 1970/07/22 MRN: [id number] PCP: Marcine Matar, MD      Visit Date: 03/25/2023   Universal Protocol:     Consent Given By: the patient  Position: PRONE  Additional Comments: Vital signs were monitored before and after the procedure. Patient was prepped and draped in the usual sterile fashion. The correct patient, procedure, and site was verified.   Injection Procedure Details:   Procedure diagnoses: Lumbar radiculopathy [M54.16]   Meds Administered:  Meds ordered this encounter  Medications   methylPREDNISolone acetate (DEPO-MEDROL) injection 40 mg     Laterality: Left  Location/Site:  L4-5  Needle: 4.5 in., 20 ga. Tuohy  Needle Placement: Paramedian epidural  Findings:   -Comments: Excellent flow of contrast into the epidural space.  Procedure Details: Using a paramedian approach from the side mentioned above, the region overlying the inferior lamina was localized under fluoroscopic visualization and the soft tissues overlying this structure were infiltrated with 4 ml. of 1% Lidocaine without Epinephrine. The Tuohy needle was inserted into the epidural space using a paramedian approach.   The epidural space was localized using loss of resistance along with counter oblique bi-planar fluoroscopic views.  After negative aspirate for air, blood, and CSF, a 2 ml. volume of Isovue-250 was injected into the epidural space and the flow of contrast was observed. Radiographs were obtained for documentation purposes.    The injectate was administered into the level noted above.   Additional Comments:  No complications occurred Dressing: 2 x 2 sterile gauze and Band-Aid    Post-procedure details: Patient was observed during the procedure. Post-procedure instructions were reviewed.  Patient left the clinic in stable condition.

## 2023-03-25 NOTE — Patient Instructions (Signed)

## 2023-04-18 DIAGNOSIS — Z419 Encounter for procedure for purposes other than remedying health state, unspecified: Secondary | ICD-10-CM | POA: Diagnosis not present

## 2023-04-20 ENCOUNTER — Ambulatory Visit (HOSPITAL_BASED_OUTPATIENT_CLINIC_OR_DEPARTMENT_OTHER): Payer: Medicaid Other | Admitting: Internal Medicine

## 2023-04-20 DIAGNOSIS — Z538 Procedure and treatment not carried out for other reasons: Secondary | ICD-10-CM

## 2023-04-20 NOTE — Progress Notes (Unsigned)
Patient was unable to log onto MyChart video despite my being on the phone with her trying to give instructions.  Advised patient that we will reschedule her for an in person visit.

## 2023-04-22 ENCOUNTER — Encounter: Payer: Self-pay | Admitting: Internal Medicine

## 2023-04-22 ENCOUNTER — Ambulatory Visit: Payer: Medicaid Other | Attending: Internal Medicine | Admitting: Internal Medicine

## 2023-04-22 VITALS — BP 131/82 | HR 98 | Temp 97.6°F | Ht 67.0 in | Wt 240.0 lb

## 2023-04-22 DIAGNOSIS — Z1231 Encounter for screening mammogram for malignant neoplasm of breast: Secondary | ICD-10-CM

## 2023-04-22 DIAGNOSIS — K625 Hemorrhage of anus and rectum: Secondary | ICD-10-CM

## 2023-04-22 DIAGNOSIS — Z860100 Personal history of colon polyps, unspecified: Secondary | ICD-10-CM | POA: Diagnosis not present

## 2023-04-22 DIAGNOSIS — F17200 Nicotine dependence, unspecified, uncomplicated: Secondary | ICD-10-CM

## 2023-04-22 DIAGNOSIS — I10 Essential (primary) hypertension: Secondary | ICD-10-CM | POA: Diagnosis not present

## 2023-04-22 DIAGNOSIS — G8929 Other chronic pain: Secondary | ICD-10-CM | POA: Diagnosis not present

## 2023-04-22 DIAGNOSIS — Z139 Encounter for screening, unspecified: Secondary | ICD-10-CM

## 2023-04-22 DIAGNOSIS — M545 Low back pain, unspecified: Secondary | ICD-10-CM

## 2023-04-22 NOTE — Patient Instructions (Addendum)
Take the Cymbalta (Duloxetine) daily for depression/anxiety and chronic pain.    Placed in [facility] [address] [place], [place] [address] PH# [phone]
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
## 2023-05-05 DIAGNOSIS — R32 Unspecified urinary incontinence: Secondary | ICD-10-CM | POA: Diagnosis not present

## 2023-05-10 ENCOUNTER — Other Ambulatory Visit: Payer: Self-pay
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
## 2023-05-10 NOTE — Patient Instructions (Signed)
Visit Information  Ms. Carbary was given information about Medicaid Managed Care team care coordination services as a part of their [facility] Medicaid benefit. Deasya Heis Gitlin verbally consented to engagement with the [facility] Managed Care team.   If you are experiencing a medical emergency, please call 911 or report to your local emergency department or urgent care.   If you have a non-emergency medical problem during routine business hours, please contact your provider's office and ask to speak with a nurse.   For questions related to your [facility] health plan, please call: [phone] or go here:[email or web address]  If you would like to schedule transportation through your [facility] plan, please call the following number at least 2 days in advance of your appointment: [phone].   You can also use the MTM portal or MTM mobile app to manage your rides. Reimbursement for transportation is available through [facility]! For the portal, please go to [email or web address]  Call the [facility] Crisis Line at [phone], at any time, 24 hours a day, 7 days a week. If you are in danger or need immediate medical attention call 911.  If you would like help to quit smoking, call 1-800-QUIT-NOW (267-143-2970) OR Espaol: 1-855-Djelo-Ya (6-440-347-4259) o para ms informacin haga clic aqu or Text READY to 563-875 to register via text  Ms. Staiger - following are the goals we discussed in your visit today:   Goals Addressed   None      Social Worker will follow up in 30 days.   Gus Puma, Kenard Gower, M[facility]  Managed Medicaid Social Worker [id number]   Following is a copy of your plan of care:  There are no care plans that you recently modified to display for this patient.

## 2023-05-19 DIAGNOSIS — Z419 Encounter for procedure for purposes other than remedying health state, unspecified: Secondary | ICD-10-CM | POA: Diagnosis not present

## 2023-05-25 ENCOUNTER — Ambulatory Visit: Payer: Medicaid Other

## 2023-05-28 ENCOUNTER — Ambulatory Visit: Payer: Self-pay

## 2023-05-28 NOTE — Telephone Encounter (Signed)
 Chief Complaint: Knee injury  Symptoms: Left knee pain, left foot pain, chronic back pain  Frequency: constant  Pertinent Negatives: Patient denies swelling, bruising, fever Disposition: [] ED /[] Urgent Care (no appt availability in office) / [] Appointment(In office/virtual)/ []  [facility]/ [x] Home Care/ [] Refused Recommended Disposition /[] [facility]/ []  Follow-up with PCP Additional Notes: Patient states she fell on 05/24/23, like her knees gave out and she just hit the ground. Patient states she landed on her left knee and now has increased left knee and foot pain. Patient states she has a history of knee pain due to previous surgeries but since the fall it is hurting more. Patient stated she has not tried any OTC treatment for the pain. Care advice was given and patient stated she would try the recommendations first and callback if the pain does not improve before her next scheduled appointment with PCP on 06/25/23.  Summary: leg and knee discomfort / rx req   The patient shares that they fell on 05/24/23  The patient shares that their left leg has began to experience discomfort  The patient has not noticed any swelling or discomfort but shares that their feet and back are sore  The patient shares that they had a double knee surgery previously  The patient would like to discuss rx options for their discomfort  Please contact the patient further when possible     Reason for Disposition  Knee giving way (or buckling) when walking  Answer Assessment - Initial Assessment Questions 1. MECHANISM: How did the injury happen? (e.g., twisting injury, direct blow)      My legs just gave out and I fell on my knees  2. ONSET: When did the injury happen? (Minutes or hours ago)      05/24/23 3. LOCATION: Where is the injury located?      Left knee  4. APPEARANCE of INJURY: What does the injury look like?      It looks normal  5. SEVERITY: Can you put weight on that  leg? Can you walk?      Yes, I can but it hurts  6. SIZE: For cuts, bruises, or swelling, ask: How large is it? (e.g., inches or centimeters;  entire joint)      None 7. PAIN: Is there pain? If Yes, ask: How bad is the pain?  What does it keep you from doing? (e.g., Scale 1-10; or mild, moderate, severe)   -  NONE: (0): no pain   -  MILD (1-3): doesn't interfere with normal activities    -  MODERATE (4-7): interferes with normal activities (e.g., work or school) or awakens from sleep, limping    -  SEVERE (8-10): excruciating pain, unable to do any normal activities, unable to walk     7/10 8. TETANUS: For any breaks in the skin, ask: When was the last tetanus booster?     Unsure  9. OTHER SYMPTOMS: Do you have any other symptoms?  (e.g., pop when knee injured, swelling, locking, buckling)      Left foot pain, limited mobility  Protocols used: Knee Injury-A-AH

## 2023-06-02 ENCOUNTER — Ambulatory Visit
Admission: RE | Admit: 2023-06-02 | Discharge: 2023-06-02 | Disposition: A | Discharge status: Home / Self Care | Payer: Medicaid Other | Source: Ambulatory Visit | Attending: Internal Medicine | Admitting: Internal Medicine

## 2023-06-02 ENCOUNTER — Telehealth: Payer: Self-pay | Admitting: Internal Medicine

## 2023-06-02 DIAGNOSIS — Z1231 Encounter for screening mammogram for malignant neoplasm of breast: Secondary | ICD-10-CM | POA: Diagnosis not present

## 2023-06-02 DIAGNOSIS — Z7689 Persons encountering health services in other specified circumstances: Secondary | ICD-10-CM | POA: Diagnosis not present

## 2023-06-02 NOTE — Telephone Encounter (Signed)
 Pt is calling to see if her daughter could be her PCA (Personal Care Assistant) to help take care of her since she is disabled. Please call pt back to discuss.

## 2023-06-03 NOTE — Telephone Encounter (Signed)
Call placed x 2 to patient unable to reach message left on VM.

## 2023-06-04 ENCOUNTER — Other Ambulatory Visit: Payer: Self-pay | Admitting: Internal Medicine

## 2023-06-04 DIAGNOSIS — R928 Other abnormal and inconclusive findings on diagnostic imaging of breast: Secondary | ICD-10-CM

## 2023-06-10 ENCOUNTER — Other Ambulatory Visit: Payer: Self-pay

## 2023-06-10 NOTE — Patient Outreach (Signed)
  Medicaid Managed Care   Unsuccessful Outreach Note  06/10/2023 Name: Elizabeth Bush MRN: [id number] DOB: 12-28-1970  Referred by: Marcine Matar, MD Reason for referral : High Risk Managed Medicaid (MM Social work unsuccessful telephone outreach )   An unsuccessful telephone outreach was attempted today. The patient was referred to the case management team for assistance with care management and care coordination.   Follow Up Plan: A HIPAA compliant phone message was left for the patient providing contact information and requesting a return call.   Abelino Derrick, MHA [facility]  Managed [facility] Social Worker [phone]

## 2023-06-10 NOTE — Patient Instructions (Signed)
  Medicaid Managed Care   Unsuccessful Outreach Note  06/10/2023 Name: Elizabeth Bush MRN: [id number] DOB: 12-28-1970  Referred by: Marcine Matar, MD Reason for referral : High Risk Managed Medicaid (MM Social work unsuccessful telephone outreach )   An unsuccessful telephone outreach was attempted today. The patient was referred to the case management team for assistance with care management and care coordination.   Follow Up Plan: A HIPAA compliant phone message was left for the patient providing contact information and requesting a return call.   Abelino Derrick, MHA [facility]  Managed [facility] Social Worker [phone]

## 2023-06-19 DIAGNOSIS — Z419 Encounter for procedure for purposes other than remedying health state, unspecified: Secondary | ICD-10-CM | POA: Diagnosis not present

## 2023-06-20 ENCOUNTER — Encounter (HOSPITAL_COMMUNITY): Payer: Self-pay

## 2023-06-20 ENCOUNTER — Other Ambulatory Visit: Payer: Self-pay

## 2023-06-20 ENCOUNTER — Emergency Department (HOSPITAL_COMMUNITY): Payer: Medicaid Other

## 2023-06-20 ENCOUNTER — Inpatient Hospital Stay (HOSPITAL_COMMUNITY)
Admission: EM | Admit: 2023-06-20 | Discharge: 2023-06-27 | DRG: 377 | Disposition: A | Discharge status: Home / Self Care | Payer: Medicaid Other | Attending: Internal Medicine | Admitting: Internal Medicine

## 2023-06-20 DIAGNOSIS — I10 Essential (primary) hypertension: Secondary | ICD-10-CM | POA: Diagnosis present

## 2023-06-20 DIAGNOSIS — Z885 Allergy status to narcotic agent status: Secondary | ICD-10-CM

## 2023-06-20 DIAGNOSIS — K5730 Diverticulosis of large intestine without perforation or abscess without bleeding: Secondary | ICD-10-CM | POA: Diagnosis not present

## 2023-06-20 DIAGNOSIS — I959 Hypotension, unspecified: Secondary | ICD-10-CM | POA: Diagnosis not present

## 2023-06-20 DIAGNOSIS — K922 Gastrointestinal hemorrhage, unspecified: Secondary | ICD-10-CM | POA: Diagnosis present

## 2023-06-20 DIAGNOSIS — E66812 Obesity, class 2: Secondary | ICD-10-CM | POA: Diagnosis not present

## 2023-06-20 DIAGNOSIS — D649 Anemia, unspecified: Principal | ICD-10-CM | POA: Diagnosis present

## 2023-06-20 DIAGNOSIS — K921 Melena: Secondary | ICD-10-CM | POA: Diagnosis not present

## 2023-06-20 DIAGNOSIS — K7682 Hepatic encephalopathy: Secondary | ICD-10-CM | POA: Diagnosis not present

## 2023-06-20 DIAGNOSIS — G9341 Metabolic encephalopathy: Secondary | ICD-10-CM | POA: Diagnosis present

## 2023-06-20 DIAGNOSIS — E872 Acidosis, unspecified: Secondary | ICD-10-CM | POA: Diagnosis present

## 2023-06-20 DIAGNOSIS — D62 Acute posthemorrhagic anemia: Secondary | ICD-10-CM | POA: Diagnosis not present

## 2023-06-20 DIAGNOSIS — D72829 Elevated white blood cell count, unspecified: Secondary | ICD-10-CM | POA: Diagnosis not present

## 2023-06-20 DIAGNOSIS — K625 Hemorrhage of anus and rectum: Secondary | ICD-10-CM | POA: Diagnosis not present

## 2023-06-20 DIAGNOSIS — Z79899 Other long term (current) drug therapy: Secondary | ICD-10-CM

## 2023-06-20 DIAGNOSIS — D509 Iron deficiency anemia, unspecified: Secondary | ICD-10-CM | POA: Diagnosis present

## 2023-06-20 DIAGNOSIS — Z0389 Encounter for observation for other suspected diseases and conditions ruled out: Secondary | ICD-10-CM | POA: Diagnosis not present

## 2023-06-20 DIAGNOSIS — F419 Anxiety disorder, unspecified: Secondary | ICD-10-CM | POA: Diagnosis present

## 2023-06-20 DIAGNOSIS — Z8249 Family history of ischemic heart disease and other diseases of the circulatory system: Secondary | ICD-10-CM

## 2023-06-20 DIAGNOSIS — K648 Other hemorrhoids: Secondary | ICD-10-CM | POA: Diagnosis not present

## 2023-06-20 DIAGNOSIS — R059 Cough, unspecified: Secondary | ICD-10-CM | POA: Diagnosis present

## 2023-06-20 DIAGNOSIS — K219 Gastro-esophageal reflux disease without esophagitis: Secondary | ICD-10-CM | POA: Diagnosis not present

## 2023-06-20 DIAGNOSIS — E878 Other disorders of electrolyte and fluid balance, not elsewhere classified: Secondary | ICD-10-CM | POA: Diagnosis present

## 2023-06-20 DIAGNOSIS — K7010 Alcoholic hepatitis without ascites: Secondary | ICD-10-CM | POA: Diagnosis present

## 2023-06-20 DIAGNOSIS — Z823 Family history of stroke: Secondary | ICD-10-CM

## 2023-06-20 DIAGNOSIS — F418 Other specified anxiety disorders: Secondary | ICD-10-CM | POA: Diagnosis not present

## 2023-06-20 DIAGNOSIS — E871 Hypo-osmolality and hyponatremia: Secondary | ICD-10-CM | POA: Diagnosis not present

## 2023-06-20 DIAGNOSIS — K635 Polyp of colon: Secondary | ICD-10-CM | POA: Diagnosis not present

## 2023-06-20 DIAGNOSIS — Z6837 Body mass index (BMI) 37.0-37.9, adult: Secondary | ICD-10-CM

## 2023-06-20 DIAGNOSIS — R531 Weakness: Secondary | ICD-10-CM | POA: Diagnosis not present

## 2023-06-20 DIAGNOSIS — E876 Hypokalemia: Secondary | ICD-10-CM | POA: Diagnosis present

## 2023-06-20 DIAGNOSIS — D259 Leiomyoma of uterus, unspecified: Secondary | ICD-10-CM | POA: Diagnosis not present

## 2023-06-20 DIAGNOSIS — Z833 Family history of diabetes mellitus: Secondary | ICD-10-CM

## 2023-06-20 DIAGNOSIS — K7460 Unspecified cirrhosis of liver: Secondary | ICD-10-CM | POA: Diagnosis not present

## 2023-06-20 DIAGNOSIS — F1010 Alcohol abuse, uncomplicated: Secondary | ICD-10-CM | POA: Diagnosis not present

## 2023-06-20 DIAGNOSIS — Z96653 Presence of artificial knee joint, bilateral: Secondary | ICD-10-CM | POA: Diagnosis present

## 2023-06-20 DIAGNOSIS — Z825 Family history of asthma and other chronic lower respiratory diseases: Secondary | ICD-10-CM

## 2023-06-20 DIAGNOSIS — F17210 Nicotine dependence, cigarettes, uncomplicated: Secondary | ICD-10-CM | POA: Diagnosis present

## 2023-06-20 DIAGNOSIS — K5790 Diverticulosis of intestine, part unspecified, without perforation or abscess without bleeding: Secondary | ICD-10-CM | POA: Diagnosis not present

## 2023-06-20 DIAGNOSIS — Z888 Allergy status to other drugs, medicaments and biological substances status: Secondary | ICD-10-CM

## 2023-06-20 DIAGNOSIS — K7030 Alcoholic cirrhosis of liver without ascites: Secondary | ICD-10-CM | POA: Diagnosis not present

## 2023-06-20 DIAGNOSIS — K6389 Other specified diseases of intestine: Secondary | ICD-10-CM | POA: Diagnosis not present

## 2023-06-20 DIAGNOSIS — F32A Depression, unspecified: Secondary | ICD-10-CM | POA: Diagnosis present

## 2023-06-20 DIAGNOSIS — N179 Acute kidney failure, unspecified: Secondary | ICD-10-CM | POA: Diagnosis not present

## 2023-06-20 DIAGNOSIS — R000 Tachycardia, unspecified: Secondary | ICD-10-CM | POA: Diagnosis not present

## 2023-06-20 DIAGNOSIS — R791 Abnormal coagulation profile: Secondary | ICD-10-CM | POA: Diagnosis present

## 2023-06-20 DIAGNOSIS — K649 Unspecified hemorrhoids: Secondary | ICD-10-CM | POA: Diagnosis not present

## 2023-06-20 DIAGNOSIS — R7989 Other specified abnormal findings of blood chemistry: Secondary | ICD-10-CM | POA: Diagnosis not present

## 2023-06-20 DIAGNOSIS — K8020 Calculus of gallbladder without cholecystitis without obstruction: Secondary | ICD-10-CM | POA: Diagnosis not present

## 2023-06-20 DIAGNOSIS — K920 Hematemesis: Secondary | ICD-10-CM | POA: Diagnosis present

## 2023-06-20 DIAGNOSIS — Z6835 Body mass index (BMI) 35.0-35.9, adult: Secondary | ICD-10-CM

## 2023-06-20 DIAGNOSIS — Z801 Family history of malignant neoplasm of trachea, bronchus and lung: Secondary | ICD-10-CM

## 2023-06-20 LAB — IRON AND TIBC
Iron: 30 ug/dL (ref 28–170)
Saturation Ratios: 17 % (ref 10.4–31.8)
TIBC: 174 ug/dL — ABNORMAL LOW (ref 250–450)
UIBC: 144 ug/dL

## 2023-06-20 LAB — COMPREHENSIVE METABOLIC PANEL
ALT: 31 U/L (ref 0–44)
AST: 72 U/L — ABNORMAL HIGH (ref 15–41)
Albumin: 1.8 g/dL — ABNORMAL LOW (ref 3.5–5.0)
Alkaline Phosphatase: 63 U/L (ref 38–126)
Anion gap: 15 (ref 5–15)
BUN: 31 mg/dL — ABNORMAL HIGH (ref 6–20)
CO2: 12 mmol/L — ABNORMAL LOW (ref 22–32)
Calcium: 8.1 mg/dL — ABNORMAL LOW (ref 8.9–10.3)
Chloride: 103 mmol/L (ref 98–111)
Creatinine, Ser: 1.6 mg/dL — ABNORMAL HIGH (ref 0.44–1.00)
GFR, Estimated: 38 mL/min — ABNORMAL LOW (ref >60)
Glucose, Bld: 111 mg/dL — ABNORMAL HIGH (ref 70–99)
Potassium: 4.2 mmol/L (ref 3.5–5.1)
Sodium: 130 mmol/L — ABNORMAL LOW (ref 135–145)
Total Bilirubin: 3.7 mg/dL — ABNORMAL HIGH (ref 0.0–1.2)
Total Protein: 7.3 g/dL (ref 6.5–8.1)

## 2023-06-20 LAB — CBC WITH DIFFERENTIAL/PLATELET
Abs Immature Granulocytes: 1.2 10*3/uL — ABNORMAL HIGH (ref 0.00–0.07)
Basophils Absolute: 0.1 10*3/uL (ref 0.0–0.1)
Basophils Relative: 1 %
Eosinophils Absolute: 0.1 10*3/uL (ref 0.0–0.5)
Eosinophils Relative: 0 %
HCT: 13.3 % — ABNORMAL LOW (ref 36.0–46.0)
Hemoglobin: 4.1 g/dL — CL (ref 12.0–15.0)
Immature Granulocytes: 5 %
Lymphocytes Relative: 11 %
Lymphs Abs: 2.8 10*3/uL (ref 0.7–4.0)
MCH: 29.7 pg (ref 26.0–34.0)
MCHC: 30.8 g/dL (ref 30.0–36.0)
MCV: 96.4 fL (ref 80.0–100.0)
Monocytes Absolute: 3 10*3/uL — ABNORMAL HIGH (ref 0.1–1.0)
Monocytes Relative: 12 %
Neutro Abs: 17 10*3/uL — ABNORMAL HIGH (ref 1.7–7.7)
Neutrophils Relative %: 71 %
Platelets: 263 10*3/uL (ref 150–400)
RBC: 1.38 MIL/uL — ABNORMAL LOW (ref 3.87–5.11)
RDW: 24.3 % — ABNORMAL HIGH (ref 11.5–15.5)
WBC: 24.2 10*3/uL — ABNORMAL HIGH (ref 4.0–10.5)
nRBC: 0.1 % (ref 0.0–0.2)

## 2023-06-20 LAB — TSH: TSH: 6.035 u[IU]/mL — ABNORMAL HIGH (ref 0.350–4.500)

## 2023-06-20 LAB — ABO/RH: ABO/RH(D): B POS

## 2023-06-20 LAB — FERRITIN: Ferritin: 31 ng/mL (ref 11–307)

## 2023-06-20 LAB — PREPARE RBC (CROSSMATCH)

## 2023-06-20 MED ORDER — PANTOPRAZOLE SODIUM 40 MG IV SOLR
40.0000 mg | Freq: Once | INTRAVENOUS | Status: AC
  Administered 2023-06-20: 40 mg via INTRAVENOUS
  Filled 2023-06-20: qty 10

## 2023-06-20 MED ORDER — SODIUM CHLORIDE 0.9% IV SOLUTION: Freq: Once | INTRAVENOUS | Status: AC

## 2023-06-20 MED ORDER — SODIUM CHLORIDE 0.9 % IV SOLN
50.0000 ug/h | INTRAVENOUS | Status: DC
  Administered 2023-06-20 – 2023-06-21 (×2): 50 ug/h via INTRAVENOUS
  Filled 2023-06-20 (×2): qty 1

## 2023-06-20 MED ORDER — IOHEXOL 350 MG/ML SOLN
100.0000 mL | Freq: Once | INTRAVENOUS | Status: AC | PRN
  Administered 2023-06-20: 100 mL via INTRAVENOUS

## 2023-06-20 MED ORDER — OCTREOTIDE LOAD VIA INFUSION
50.0000 ug | Freq: Once | INTRAVENOUS | Status: AC
  Administered 2023-06-20: 50 ug via INTRAVENOUS
  Filled 2023-06-20: qty 25

## 2023-06-20 MED ORDER — SODIUM CHLORIDE 0.9 % IV SOLN
2.0000 g | Freq: Once | INTRAVENOUS | Status: AC
  Administered 2023-06-20: 2 g via INTRAVENOUS
  Filled 2023-06-20: qty 20
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
## 2023-06-20 NOTE — ED Triage Notes (Addendum)
Pt BIB EMS from home with reports of depression. Pt has been in bed not wanting to eat or do anything x 3 weeks. Family states that she wakes up and goes back to sleep all day. Pt had a family member recently pass away. Pt reports drinking beer over the past few weeks.

## 2023-06-21 DIAGNOSIS — I10 Essential (primary) hypertension: Secondary | ICD-10-CM | POA: Diagnosis not present

## 2023-06-21 DIAGNOSIS — R7989 Other specified abnormal findings of blood chemistry: Secondary | ICD-10-CM

## 2023-06-21 DIAGNOSIS — D72829 Elevated white blood cell count, unspecified: Secondary | ICD-10-CM | POA: Diagnosis not present

## 2023-06-21 DIAGNOSIS — D509 Iron deficiency anemia, unspecified: Secondary | ICD-10-CM | POA: Diagnosis not present

## 2023-06-21 DIAGNOSIS — D649 Anemia, unspecified: Secondary | ICD-10-CM | POA: Diagnosis not present

## 2023-06-21 DIAGNOSIS — K922 Gastrointestinal hemorrhage, unspecified: Secondary | ICD-10-CM | POA: Diagnosis not present

## 2023-06-21 DIAGNOSIS — F1010 Alcohol abuse, uncomplicated: Secondary | ICD-10-CM

## 2023-06-21 DIAGNOSIS — K219 Gastro-esophageal reflux disease without esophagitis: Secondary | ICD-10-CM | POA: Diagnosis not present

## 2023-06-21 DIAGNOSIS — K7030 Alcoholic cirrhosis of liver without ascites: Secondary | ICD-10-CM

## 2023-06-21 DIAGNOSIS — E872 Acidosis, unspecified: Secondary | ICD-10-CM | POA: Diagnosis not present

## 2023-06-21 DIAGNOSIS — E871 Hypo-osmolality and hyponatremia: Secondary | ICD-10-CM

## 2023-06-21 DIAGNOSIS — R000 Tachycardia, unspecified: Secondary | ICD-10-CM | POA: Diagnosis not present

## 2023-06-21 DIAGNOSIS — K921 Melena: Secondary | ICD-10-CM | POA: Diagnosis not present

## 2023-06-21 DIAGNOSIS — K648 Other hemorrhoids: Secondary | ICD-10-CM | POA: Diagnosis not present

## 2023-06-21 DIAGNOSIS — K5730 Diverticulosis of large intestine without perforation or abscess without bleeding: Secondary | ICD-10-CM | POA: Diagnosis not present

## 2023-06-21 DIAGNOSIS — K7010 Alcoholic hepatitis without ascites: Secondary | ICD-10-CM | POA: Diagnosis not present

## 2023-06-21 LAB — CBC
HCT: 17.3 % — ABNORMAL LOW (ref 36.0–46.0)
HCT: 23.1 % — ABNORMAL LOW (ref 36.0–46.0)
Hemoglobin: 5.5 g/dL — CL (ref 12.0–15.0)
Hemoglobin: 7.9 g/dL — ABNORMAL LOW (ref 12.0–15.0)
MCH: 30.2 pg (ref 26.0–34.0)
MCH: 30.5 pg (ref 26.0–34.0)
MCHC: 31.8 g/dL (ref 30.0–36.0)
MCHC: 34.2 g/dL (ref 30.0–36.0)
MCV: 95.1 fL (ref 80.0–100.0)
MCV: 89.2 fL (ref 80.0–100.0)
Platelets: 252 10*3/uL (ref 150–400)
Platelets: 232 10*3/uL (ref 150–400)
RBC: 1.82 MIL/uL — ABNORMAL LOW (ref 3.87–5.11)
RBC: 2.59 MIL/uL — ABNORMAL LOW (ref 3.87–5.11)
RDW: 21.4 % — ABNORMAL HIGH (ref 11.5–15.5)
RDW: 20.8 % — ABNORMAL HIGH (ref 11.5–15.5)
WBC: 24.4 10*3/uL — ABNORMAL HIGH (ref 4.0–10.5)
WBC: 21.4 10*3/uL — ABNORMAL HIGH (ref 4.0–10.5)
nRBC: 0.2 % (ref 0.0–0.2)
nRBC: 0.2 % (ref 0.0–0.2)

## 2023-06-21 LAB — COMPREHENSIVE METABOLIC PANEL
ALT: 30 U/L (ref 0–44)
AST: 79 U/L — ABNORMAL HIGH (ref 15–41)
Albumin: 1.9 g/dL — ABNORMAL LOW (ref 3.5–5.0)
Alkaline Phosphatase: 63 U/L (ref 38–126)
Anion gap: 14 (ref 5–15)
BUN: 28 mg/dL — ABNORMAL HIGH (ref 6–20)
CO2: 15 mmol/L — ABNORMAL LOW (ref 22–32)
Calcium: 8.1 mg/dL — ABNORMAL LOW (ref 8.9–10.3)
Chloride: 103 mmol/L (ref 98–111)
Creatinine, Ser: 1.68 mg/dL — ABNORMAL HIGH (ref 0.44–1.00)
GFR, Estimated: 36 mL/min — ABNORMAL LOW (ref >60)
Glucose, Bld: 113 mg/dL — ABNORMAL HIGH (ref 70–99)
Potassium: 4 mmol/L (ref 3.5–5.1)
Sodium: 132 mmol/L — ABNORMAL LOW (ref 135–145)
Total Bilirubin: 5.2 mg/dL — ABNORMAL HIGH (ref 0.0–1.2)
Total Protein: 7.4 g/dL (ref 6.5–8.1)

## 2023-06-21 LAB — BASIC METABOLIC PANEL
Anion gap: 13 (ref 5–15)
BUN: 30 mg/dL — ABNORMAL HIGH (ref 6–20)
CO2: 13 mmol/L — ABNORMAL LOW (ref 22–32)
Calcium: 8.1 mg/dL — ABNORMAL LOW (ref 8.9–10.3)
Chloride: 104 mmol/L (ref 98–111)
Creatinine, Ser: 1.69 mg/dL — ABNORMAL HIGH (ref 0.44–1.00)
GFR, Estimated: 36 mL/min — ABNORMAL LOW (ref >60)
Glucose, Bld: 116 mg/dL — ABNORMAL HIGH (ref 70–99)
Potassium: 4.3 mmol/L (ref 3.5–5.1)
Sodium: 130 mmol/L — ABNORMAL LOW (ref 135–145)

## 2023-06-21 LAB — HEMOGLOBIN AND HEMATOCRIT, BLOOD
HCT: 31.8 % — ABNORMAL LOW (ref 36.0–46.0)
Hemoglobin: 10.8 g/dL — ABNORMAL LOW (ref 12.0–15.0)

## 2023-06-21 LAB — PROCALCITONIN: Procalcitonin: 0.83 ng/mL

## 2023-06-21 LAB — T4, FREE: Free T4: 0.76 ng/dL (ref 0.61–1.12)

## 2023-06-21 LAB — FOLATE: Folate: 8.3 ng/mL (ref >5.9)

## 2023-06-21 LAB — VITAMIN B12: Vitamin B-12: 3384 pg/mL — ABNORMAL HIGH (ref 180–914)

## 2023-06-21 LAB — AMMONIA: Ammonia: 45 umol/L — ABNORMAL HIGH (ref 9–35)

## 2023-06-21 LAB — PROTIME-INR
INR: 2 — ABNORMAL HIGH (ref 0.8–1.2)
Prothrombin Time: 22.8 s — ABNORMAL HIGH (ref 11.4–15.2)

## 2023-06-21 LAB — LACTIC ACID, PLASMA
Lactic Acid, Venous: 2.2 mmol/L (ref 0.5–1.9)
Lactic Acid, Venous: 1.9 mmol/L (ref 0.5–1.9)

## 2023-06-21 LAB — APTT: aPTT: 42 s — ABNORMAL HIGH (ref 24–36)

## 2023-06-21 LAB — PREPARE RBC (CROSSMATCH)

## 2023-06-21 LAB — MAGNESIUM: Magnesium: 1.4 mg/dL — ABNORMAL LOW (ref 1.7–2.4)

## 2023-06-21 LAB — TROPONIN I (HIGH SENSITIVITY): Troponin I (High Sensitivity): 4 ng/L (ref <18)

## 2023-06-21 MED ORDER — CHLORHEXIDINE GLUCONATE CLOTH 2 % EX PADS
6.0000 | MEDICATED_PAD | Freq: Every day | CUTANEOUS | Status: AC
  Administered 2023-06-21 – 2023-06-25 (×4): 6 via TOPICAL

## 2023-06-21 MED ORDER — SODIUM BICARBONATE 8.4 % IV SOLN
INTRAVENOUS | Status: AC
  Filled 2023-06-21: qty 50

## 2023-06-21 MED ORDER — PANTOPRAZOLE SODIUM 40 MG IV SOLR
40.0000 mg | Freq: Two times a day (BID) | INTRAVENOUS | Status: DC
  Administered 2023-06-21 – 2023-06-23 (×6): 40 mg via INTRAVENOUS
  Filled 2023-06-21 (×6): qty 10

## 2023-06-21 MED ORDER — THIAMINE MONONITRATE 100 MG PO TABS
100.0000 mg | ORAL_TABLET | Freq: Every day | ORAL | Status: DC
Start: 2023-06-21 — End: 2023-06-27
  Administered 2023-06-22 – 2023-06-27 (×6): 100 mg via ORAL
  Filled 2023-06-21 (×6): qty 1

## 2023-06-21 MED ORDER — SODIUM CHLORIDE 0.9% IV SOLUTION
Freq: Once | INTRAVENOUS | Status: AC
Start: 2023-06-21 — End: 2023-06-21

## 2023-06-21 MED ORDER — CHLORHEXIDINE GLUCONATE CLOTH 2 % EX PADS: 6.0000 | MEDICATED_PAD | Freq: Every day | CUTANEOUS | Status: DC

## 2023-06-21 MED ORDER — SODIUM BICARBONATE 8.4 % IV SOLN
100.0000 meq | Freq: Once | INTRAVENOUS | Status: AC
  Administered 2023-06-21: 100 meq via INTRAVENOUS
  Filled 2023-06-21: qty 50

## 2023-06-21 MED ORDER — MUPIROCIN 2 % EX OINT
1.0000 | TOPICAL_OINTMENT | Freq: Two times a day (BID) | CUTANEOUS | Status: AC
  Administered 2023-06-21 – 2023-06-25 (×9): 1 via NASAL
  Filled 2023-06-21 (×5): qty 22

## 2023-06-21 MED ORDER — ORAL CARE MOUTH RINSE: 15.0000 mL | OROMUCOSAL | Status: DC | PRN

## 2023-06-21 MED ORDER — LACTULOSE 10 GM/15ML PO SOLN
10.0000 g | Freq: Three times a day (TID) | ORAL | Status: DC
  Administered 2023-06-21 – 2023-06-25 (×10): 10 g via ORAL
  Filled 2023-06-21 (×10): qty 15

## 2023-06-21 MED ORDER — SODIUM CHLORIDE 0.9 % IV SOLN
2.0000 g | INTRAVENOUS | Status: DC
  Administered 2023-06-21 – 2023-06-22 (×2): 2 g via INTRAVENOUS
  Filled 2023-06-21 (×2): qty 20

## 2023-06-21 MED ORDER — MAGNESIUM SULFATE 4 GM/100ML IV SOLN
4.0000 g | Freq: Once | INTRAVENOUS | Status: AC
Start: 2023-06-21 — End: 2023-06-21
  Administered 2023-06-21: 4 g via INTRAVENOUS
  Filled 2023-06-21: qty 100

## 2023-06-21 MED ORDER — LACTATED RINGERS IV SOLN
INTRAVENOUS | Status: DC
Start: 2023-06-21 — End: 2023-06-21

## 2023-06-21 NOTE — Plan of Care (Signed)
   Problem: Education: Goal: Knowledge of General Education information will improve Description: Including pain rating scale, medication(s)/side effects and non-pharmacologic comfort measures Outcome: Progressing   Problem: Activity: Goal: Risk for activity intolerance will decrease Outcome: Progressing   Problem: Nutrition: Goal: Adequate nutrition will be maintained Outcome: Progressing

## 2023-06-21 NOTE — Plan of Care (Signed)
Discussed with patient plan for the shift, pain management and admission questions with some teach back displayed.    Problem: Education: Goal: Knowledge of General Education information will improve Description: Including pain rating scale, medication(s)/side effects and non-pharmacologic comfort measures Outcome: Progressing   Problem: Health Behavior/Discharge Planning: Goal: Ability to manage health-related needs will improve Outcome: Progressing   Problem: Pain Managment: Goal: General experience of comfort will improve and/or be controlled Outcome: Progressing

## 2023-06-21 NOTE — Progress Notes (Addendum)
eLink Physician-Brief Progress Note Patient Name: Elizabeth Bush DOB: 23-Mar-1971 MRN: [id number]   Date of Service  06/21/2023  HPI/Events of Note  53 year old female with a history of alcohol use disorder, cirrhosis, anxiety/depression who presents to the emergency department for evaluation of worsening depression.  No obvious bleeding was reported but she was noted to have severe anemia and admitted to the ICU for further management.  Vitals consistent with tachycardia but otherwise normal.  Saturating 98% on room air.  Started on octreotide and received prophylactic ceftriaxone.  Results consistent with elevated creatinine, leukocytosis, and hemoglobin of 4.1.  eICU Interventions  3 units of PRBCs pending.  Will trend hemoglobin every 6 hours  Maintain octreotide infusion, PPI scheduled, and prophylactic ceftriaxone.  Currently has 2 20g PIV use, no active signs of bleeding, but may need to additional access  Will reevaluate after complete ground team evaluation  DVT prophylaxis held in the setting of potential bleeding GI prophylaxis with pantoprazole   0249 - Hemoglobin 5.5 after 1 unit PRBC.  Transfuse 2 additional units.  Intervention Category Evaluation Type: New Patient Evaluation  [name] 06/21/2023, 1:23 AM
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
## 2023-06-21 NOTE — Progress Notes (Signed)
[facility] ADULT ICU REPLACEMENT PROTOCOL   The patient does apply for the [facility] Adult ICU Electrolyte Replacment Protocol based on the criteria listed below:   1.Exclusion criteria: TCTS, ECMO, Dialysis, and Myasthenia Gravis patients 2. Is GFR >/= 30 ml/min? Yes.    Patient's GFR today is 36 3. Is SCr </= 2? Yes.   Patient's SCr is 1.69 mg/dL 4. Did SCr increase >/= 0.5 in 24 hours? No. 5.Pt's weight >40kg  Yes.   6. Abnormal electrolyte(s): Mag 1.4  7. Electrolytes replaced per protocol 8.  Call MD STAT for K+ </= 2.5, Phos </= 1, or Mag </= 1 Physician:  Dr. Loralyn Freshwater, Lilia Argue 06/21/2023 2:36 AM

## 2023-06-21 NOTE — Progress Notes (Addendum)
NAME:  Elizabeth Bush, MRN:  [id number], DOB:  [birth year], LOS: 1 ADMISSION DATE:  06/20/2023, CONSULTATION DATE:  06/21/23 REFERRING MD:  EDP, CHIEF COMPLAINT:  acute GIB   History of Present Illness:  53 yo female presented to [facility] with depression and feeling of "feeling overwhelmed". Upon further questioning she states that she has been dealing with hematemesis and hematochezia. Known ETOH abuse and cirrhosis, h/o gib per pt. She states this has been going on for "quite awhile", states her last drink was ~3 weeks ago. She is slightly confused at this time and unable to provide much history. She states she has chronic abdominal pain but unable to qualify it.   Upon presentation she was noted to have hgb of 4.1, BP was surprisingly stable though. She was getting a 2nd unit of blood at time of my eval. [facility] has been consulted via EDP and reports will see in am. In meantime she is on octreotide, given empiric ctx. Cx pending.   Pertinent  Medical History  Depression Anxiety Cirrhosis Etoh abuse H/o gib  Significant Hospital Events: Including procedures, antibiotic start and stop dates in addition to other pertinent events   Admitted to ICU for acute GIB 2/3. Started on PPI, octreotide, empiric rocephin, got blood. GI consulted.   Interim History / Subjective:  Uncomfortable all over   Objective   Blood pressure (!) 96/47, pulse 91, temperature 98.8 F (37.1 C), resp. rate 15, height 5\' 7"  (1.702 m), weight 102.8 kg, SpO2 100%.        Intake/Output Summary (Last 24 hours) at 06/21/2023 1114 Last data filed at 06/21/2023 4098 Gross per 24 hour  Intake 1375.06 ml  Output --  Net 1375.06 ml   Filed Weights   06/20/23 1448 06/21/23 0103  Weight: 108.9 kg 102.8 kg    Examination: General this is a 53 year old female who is laying in bed. She is not in distress BUT does appear to have generalized discomfort.  HENT sclera are not icteric. Her mucous membranes are pale. No JVD Pulm clear no  accessory use on room air Card RRR soft systolic heart murmur noted Abd soft but tender diffusely. + bowel sounds.  Ext warm  Neuro affect flat. Moves all ext. Oriented x 2 to 3. Having some memory defs and not able to tell me where she is   Resolved Hospital Problem list     Assessment & Plan:  Acute on subacute GI w/ both hematemesis and hematochezia  H/o hemorrhoids  Now s/p 2 units PRBC Plan Await f/u CBC Transfuse for hgb < 7 (I think her anemia although very low is subacute)  Cont octreotide gtt day 1 of 3-5d total  Cont PPI IBV q 12 Hold AC NPO GI following. Will likely need EGD +/- colonoscopy   Alcohol related Cirrhosis -mild elevated LFTs (but better than previous encounters)  -no ascites on CT abd -reports has not drank for 3 weeks Plan Repeat LFTs and PT in am  Continue to encourage alcohol cessation  Ceftriaxone empirically day 2 of x to cover for SBP  Acute metabolic encephalopathy. Wernicke's ? Hepatic encephalopathy if reporting is accurate should not be w/d Plan F/u ammonia, if elevated add lactulose and rifaximin Will start thiamine and folate IV. If ammonia nml may go with higher thiamine dosing to cover for wernicke   AKI w/ mixed NAGMA (prob bicarb loss)and anion gap acidosis  in setting of lactic acidosis  Plan Repeating post-transfusion lactate Repeating  post-resuscitation chemistry now and again in am  Cont LR at 100cc/hr  If renal fxn not improving may need to consider adding albumin given risk of HRF   Fluid and electrolyte imbalance  -mild hyponatremia, hyperchloremia & hypomagnesemia  Plan Repeat chem s/p resuscitation   GERD Plan Cont PPI  Elevated TSH Plan F/u TFTs  Small skin tear left gluteal fold. < Dime in diameter (POA) Plan Optimize nutrition  Skin care Mobilize   Best Practice (right click and "Reselect all SmartList Selections" daily)   Diet/type: NPO DVT prophylaxis SCD Pressure ulcer(s): N/A GI prophylaxis:  PPI Lines: N/A Foley:  N/A Code Status:  full code Last date of multidisciplinary goals of care discussion [pending discussion when mentation improves]   Critical care time: 32 min

## 2023-06-22 ENCOUNTER — Inpatient Hospital Stay (HOSPITAL_COMMUNITY): Payer: Medicaid Other

## 2023-06-22 DIAGNOSIS — F1010 Alcohol abuse, uncomplicated: Secondary | ICD-10-CM | POA: Diagnosis not present

## 2023-06-22 DIAGNOSIS — K921 Melena: Secondary | ICD-10-CM

## 2023-06-22 DIAGNOSIS — E872 Acidosis, unspecified: Secondary | ICD-10-CM | POA: Diagnosis not present

## 2023-06-22 DIAGNOSIS — D649 Anemia, unspecified: Secondary | ICD-10-CM | POA: Diagnosis not present

## 2023-06-22 DIAGNOSIS — K7010 Alcoholic hepatitis without ascites: Secondary | ICD-10-CM | POA: Diagnosis not present

## 2023-06-22 DIAGNOSIS — K5730 Diverticulosis of large intestine without perforation or abscess without bleeding: Secondary | ICD-10-CM | POA: Diagnosis not present

## 2023-06-22 DIAGNOSIS — D509 Iron deficiency anemia, unspecified: Secondary | ICD-10-CM | POA: Diagnosis not present

## 2023-06-22 DIAGNOSIS — K648 Other hemorrhoids: Secondary | ICD-10-CM | POA: Diagnosis not present

## 2023-06-22 DIAGNOSIS — K920 Hematemesis: Secondary | ICD-10-CM

## 2023-06-22 DIAGNOSIS — Z0389 Encounter for observation for other suspected diseases and conditions ruled out: Secondary | ICD-10-CM | POA: Diagnosis not present

## 2023-06-22 DIAGNOSIS — R7989 Other specified abnormal findings of blood chemistry: Secondary | ICD-10-CM | POA: Diagnosis not present

## 2023-06-22 DIAGNOSIS — K7030 Alcoholic cirrhosis of liver without ascites: Secondary | ICD-10-CM | POA: Diagnosis not present

## 2023-06-22 DIAGNOSIS — D72829 Elevated white blood cell count, unspecified: Secondary | ICD-10-CM | POA: Diagnosis not present

## 2023-06-22 LAB — URINALYSIS, ROUTINE W REFLEX MICROSCOPIC
Bilirubin Urine: NEGATIVE
Glucose, UA: NEGATIVE mg/dL
Ketones, ur: NEGATIVE mg/dL
Nitrite: NEGATIVE
Protein, ur: NEGATIVE mg/dL
Specific Gravity, Urine: 1.02 (ref 1.005–1.030)
pH: 5 (ref 5.0–8.0)

## 2023-06-22 LAB — COMPREHENSIVE METABOLIC PANEL
ALT: 24 U/L (ref 0–44)
AST: 62 U/L — ABNORMAL HIGH (ref 15–41)
Albumin: <1.5 g/dL — ABNORMAL LOW (ref 3.5–5.0)
Alkaline Phosphatase: 61 U/L (ref 38–126)
Anion gap: 9 (ref 5–15)
BUN: 29 mg/dL — ABNORMAL HIGH (ref 6–20)
CO2: 18 mmol/L — ABNORMAL LOW (ref 22–32)
Calcium: 7.6 mg/dL — ABNORMAL LOW (ref 8.9–10.3)
Chloride: 102 mmol/L (ref 98–111)
Creatinine, Ser: 1.93 mg/dL — ABNORMAL HIGH (ref 0.44–1.00)
GFR, Estimated: 31 mL/min — ABNORMAL LOW (ref >60)
Glucose, Bld: 142 mg/dL — ABNORMAL HIGH (ref 70–99)
Potassium: 4 mmol/L (ref 3.5–5.1)
Sodium: 129 mmol/L — ABNORMAL LOW (ref 135–145)
Total Bilirubin: 3.9 mg/dL — ABNORMAL HIGH (ref 0.0–1.2)
Total Protein: 6.2 g/dL — ABNORMAL LOW (ref 6.5–8.1)

## 2023-06-22 LAB — CBC
HCT: 20.6 % — ABNORMAL LOW (ref 36.0–46.0)
Hemoglobin: 6.9 g/dL — CL (ref 12.0–15.0)
MCH: 29.9 pg (ref 26.0–34.0)
MCHC: 33.5 g/dL (ref 30.0–36.0)
MCV: 89.2 fL (ref 80.0–100.0)
Platelets: 195 10*3/uL (ref 150–400)
RBC: 2.31 MIL/uL — ABNORMAL LOW (ref 3.87–5.11)
RDW: 21.2 % — ABNORMAL HIGH (ref 11.5–15.5)
WBC: 15.9 10*3/uL — ABNORMAL HIGH (ref 4.0–10.5)
nRBC: 0.5 % — ABNORMAL HIGH (ref 0.0–0.2)

## 2023-06-22 LAB — HEMOGLOBIN AND HEMATOCRIT, BLOOD
HCT: 25.5 % — ABNORMAL LOW (ref 36.0–46.0)
Hemoglobin: 8.5 g/dL — ABNORMAL LOW (ref 12.0–15.0)

## 2023-06-22 LAB — PROTEIN / CREATININE RATIO, URINE
Creatinine, Urine: 148 mg/dL
Protein Creatinine Ratio: 0.12 mg/mg{creat} (ref 0.00–0.15)
Total Protein, Urine: 18 mg/dL

## 2023-06-22 LAB — PROTIME-INR
INR: 2.3 — ABNORMAL HIGH (ref 0.8–1.2)
Prothrombin Time: 25.2 s — ABNORMAL HIGH (ref 11.4–15.2)

## 2023-06-22 LAB — PREPARE RBC (CROSSMATCH)

## 2023-06-22 LAB — SODIUM, URINE, RANDOM: Sodium, Ur: <10 mmol/L

## 2023-06-22 LAB — T3: T3, Total: 59 ng/dL — ABNORMAL LOW (ref 71–180)

## 2023-06-22 LAB — PROCALCITONIN: Procalcitonin: 0.72 ng/mL

## 2023-06-22 MED ORDER — VITAMIN K1 10 MG/ML IJ SOLN
10.0000 mg | Freq: Once | INTRAMUSCULAR | Status: AC
  Administered 2023-06-22: 10 mg via SUBCUTANEOUS
  Filled 2023-06-22: qty 1

## 2023-06-22 MED ORDER — DEXTROMETHORPHAN POLISTIREX ER 30 MG/5ML PO SUER
15.0000 mg | Freq: Two times a day (BID) | ORAL | Status: DC
  Administered 2023-06-22 – 2023-06-27 (×10): 15 mg via ORAL
  Filled 2023-06-22 (×10): qty 5

## 2023-06-22 MED ORDER — ALBUMIN HUMAN 5 % IV SOLN
50.0000 g | Freq: Three times a day (TID) | INTRAVENOUS | Status: AC
Start: 2023-06-22 — End: 2023-06-23
  Administered 2023-06-22 – 2023-06-23 (×2): 50 g via INTRAVENOUS
  Filled 2023-06-22 (×2): qty 1000

## 2023-06-22 MED ORDER — SODIUM CHLORIDE 0.9% IV SOLUTION
Freq: Once | INTRAVENOUS | Status: AC
Start: 2023-06-22 — End: 2023-06-22

## 2023-06-22 MED ORDER — ALBUMIN HUMAN 5 % IV SOLN
50.0000 g | Freq: Four times a day (QID) | INTRAVENOUS | Status: DC
Start: 2023-06-22 — End: 2023-06-22
  Filled 2023-06-22: qty 1000

## 2023-06-22 NOTE — Progress Notes (Addendum)
 Progress Note   Subjective  Hospital day #2 Chief Complaint: Anemia in the setting of alcoholic cirrhosis with reported GI bleed  Today patient's youngest daughter is by her bedside who does assist with history.  Apparently she has been living with her mom for the past 2 weeks and tells me she has witnessed multiple dark maroon bloody bowel movements over this time period, sometimes multiple a day.  It seemed to be getting worse and her mom was spending most of her day in the bed asleep.  She was drinking alcohol  up until the day she was admitted.  Her daughter tells me she would drink at least 2 beers and then go back to bed and would wake up and drink more, unsure what the quantity was per day.  No vomiting witnessed per her daughter.  Patient is still lethargic and still does not answer many of my questions today.  Per nursing staff they just received her on the floor this morning and there has been no report of any witnessed GI bleeding, patient is still not had a bowel movement.  No vomiting.  Patient does deny abdominal pain.   Objective   Vital signs in last 24 hours: Temp:  [97.3 F (36.3 C)-98.9 F (37.2 C)] 98.8 F (37.1 C) (02/04 0940) Pulse Rate:  [83-110] 99 (02/04 0940) Resp:  [12-30] 16 (02/04 0940) BP: (67-145)/(35-100) 108/67 (02/04 0940) SpO2:  [94 %-100 %] 99 % (02/04 0940) Last BM Date :  (patient states she doesnt remember) General:   AA female in NAD Heart:  Regular rate and rhythm; no murmurs Lungs: Respirations even and unlabored, lungs CTA bilaterally Abdomen:  Soft, nontender and nondistended. Normal bowel sounds. Psych:  Cooperative. Normal mood and affect.  Intake/Output from previous day: 02/03 0701 - 02/04 0700 In: 1513.6 [I.V.:427.6; Blood:1086] Out: -  Intake/Output this shift: Total I/O In: 240 [P.O.:240] Out: -   Lab Results: Recent Labs    06/21/23 0123 06/21/23 1320 06/21/23 1907 06/22/23 0445  WBC 24.4* 21.4*  --  15.9*  HGB  5.5* 7.9* 10.8* 6.9*  HCT 17.3* 23.1* 31.8* 20.6*  PLT 252 232  --  195   BMET Recent Labs    06/21/23 0123 06/21/23 1320 06/22/23 0445  NA 130* 132* 129*  K 4.3 4.0 4.0  CL 104 103 102  CO2 13* 15* 18*  GLUCOSE 116* 113* 142*  BUN 30* 28* 29*  CREATININE 1.69* 1.68* 1.93*  CALCIUM  8.1* 8.1* 7.6*   LFT Recent Labs    06/22/23 0445  PROT 6.2*  ALBUMIN  <1.5*  AST 62*  ALT 24  ALKPHOS 61  BILITOT 3.9*   PT/INR Recent Labs    06/21/23 1320 06/22/23 0445  LABPROT 22.8* 25.2*  INR 2.0* 2.3*    Studies/Results: CT ANGIO ABDOMEN PELVIS  W & WO CONTRAST Result Date: 06/20/2023 CLINICAL DATA:  Lower GI bleed. EXAM: CTA ABDOMEN AND PELVIS WITHOUT AND WITH CONTRAST TECHNIQUE: Multidetector CT imaging of the abdomen and pelvis was performed using the standard protocol during bolus administration of intravenous contrast. Multiplanar reconstructed images and MIPs were obtained and reviewed to evaluate the vascular anatomy. RADIATION DOSE REDUCTION: This exam was performed according to the departmental dose-optimization program which includes automated exposure control, adjustment of the mA and/or kV according to patient size and/or use of iterative reconstruction technique. CONTRAST:  OMNIPAQUE  IOHEXOL  350 MG/ML SOLN COMPARISON:  07/23/2020. FINDINGS: VASCULAR Aorta: Mildly patent.  No aneurysm. Celiac: Widely patent. SMA: Widely  patent. Renals: Widely patent. IMA: Widely patent. Inflow: Widely patent. Proximal Outflow: Widely patent. Veins: Widely patent.  Circumaortic left renal vein. Review of the MIP images confirms the above findings. NON-VASCULAR Lower chest: No acute findings. Heart size normal. Heart is at the upper limits of normal in size to mildly enlarged. No pericardial or pleural effusion. Distal esophagus is grossly unremarkable. Hepatobiliary: Liver is decreased in attenuation diffusely and the margin is irregular. Stones in the gallbladder. No biliary ductal  dilatation. Pancreas: Negative. Spleen: Negative. Adrenals/Urinary Tract: Adrenal glands are unremarkable. Low-attenuation lesion in the right kidney. Right renal cyst. Minimally hyperdense left renal cyst. No specific follow-up necessary. No urinary stones. Ureters are decompressed. Bladder is grossly unremarkable. Stomach/Bowel: Stomach and small bowel are unremarkable. Ileocecal junction is in the high right abdomen, anterior to the liver. Appendix and colon are otherwise unremarkable. Lymphatic: No pathologically enlarged lymph nodes. Reproductive: Fibroid uterus.  No adnexal mass. Other: No free fluid.  Mesenteries and peritoneum are unremarkable. Musculoskeletal: Degenerative changes in the spine. Old left acetabular fracture. IMPRESSION: VASCULAR None. NON-VASCULAR 1. Cirrhosis. 2. Cholelithiasis. 3. Fibroid uterus. Electronically Signed   By: Newell Eke M.D.   On: 06/20/2023 19:30    Assessment / Plan:   Assessment: 1.  Acute GI bleed: Reports of dark maroon stool at home over the past 2 weeks at least, likely going on before this with increasing weakness and fatigue per patient's daughter, also continued to drink over this time,  Hemoglobin dropped 7.9-->5.5 overnight, BUN and Crea stable, INR elevated due to liver disease, MELD 30 (06/22/23) DF score today 60; suspect upper GI bleed most likely 2.  Alcohol  abuse with question of cirrhosis: most likely has acute alcoholic hepatitis with DF of 60 3.  Elevated BUN/creatinine 4.  Leukocytosis: improving 5.  Hyponatremia 6.  Lactic acidosis  Plan: 1.  Will go ahead and plan for EGD tomorrow.  I do not think patient could complete a bowel prep today given that she is still very lethargic.  Did go ahead and discussed risks, benefits, limitations and alternatives and the patient agrees to proceed. 2.  Patient can continue on a clear liquid diet today and will be n.p.o. at midnight 3.  Pending results from EGD can consider colonoscopy if  necessary. 4.  Continue to monitor hemoglobin and transfusion as needed less than 7 5.  Continue to watch for acute GI bleed, I do suspect that the next time she has a bowel movement it will be bloody given that she has not had one in 2 days and her hemoglobin has been dropping. 6.  Patient is also likely in alcohol  withdrawal given report from her daughter that she was drinking until the day she came in, CIWA protocol appropriate 7.  Will discuss Prednisolone  with Dr. Avram given DF of 60  Thank you for your kind consultation, we will continue to follow.   LOS: 2 days   Delon Hendricks Failing  06/22/2023, 10:05 AM     [facility] Attending   I have taken an interval history, reviewed the chart and examined the patient. I agree with the Advanced Practitioner's note, impression and recommendations with the following additions:  She is more alert and I do not find asterixis.  Am concerned about AKI - ? HRS and agree w/ albumin  Tx as per [facility] - may need to go further.   Have ordered urine studies.  Will start prednisolone  for alcoholic hepatitis if no acute infectious hepatitis (ordered screen)  EGD tomorrow. She will not be ready for a colonoscopy.  Prognosis is guarded.  There was no ascites on admission CT  Lupita CHARLENA Commander, MD, [facility] See [facility] on call - gastroenterology for best contact person 06/22/2023 3:16 PM

## 2023-06-22 NOTE — Progress Notes (Signed)
 eLink Physician-Brief Progress Note Patient Name: ROSANNE WOHLFARTH DOB: 10/16/1970 MRN: [id number]   Date of Service  06/22/2023  HPI/Events of Note  53 year old female with a history of alcohol  use disorder, cirrhosis, anxiety/depression who presents to the emergency department for evaluation of worsening depression.  No obvious bleeding was reported but she was noted to have severe anemia and admitted to the ICU for further management.  Transferred to [facility] hospitalist.  Hemoglobin 6.8.  eICU Interventions  Transfuse 1 unit PRBC    Brittney Caraway 06/22/2023, 5:42 AM
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
## 2023-06-22 NOTE — Plan of Care (Signed)
   Problem: Education: Goal: Knowledge of General Education information will improve Description Including pain rating scale, medication(s)/side effects and non-pharmacologic comfort measures Outcome: Progressing

## 2023-06-22 NOTE — Progress Notes (Signed)
 Called on call elink for patient's bladder scan result of . Elink informed RN that orders for I/o cath are not placed until or greater, or if patient is uncomfortable. Patient is currently sleeping at this time and is not uncomfortable. Will continue to monitor for urine output and day shift team to follow up.

## 2023-06-23 ENCOUNTER — Encounter (HOSPITAL_COMMUNITY)
Admission: EM | Disposition: A | Discharge status: Home / Self Care | Payer: Self-pay | Source: Home / Self Care | Attending: Internal Medicine

## 2023-06-23 ENCOUNTER — Inpatient Hospital Stay (HOSPITAL_COMMUNITY): Payer: Medicaid Other | Admitting: Certified Registered Nurse Anesthetist

## 2023-06-23 ENCOUNTER — Encounter (HOSPITAL_COMMUNITY): Payer: Self-pay | Admitting: Pulmonary Disease

## 2023-06-23 ENCOUNTER — Other Ambulatory Visit: Payer: Medicaid Other

## 2023-06-23 DIAGNOSIS — D649 Anemia, unspecified: Secondary | ICD-10-CM

## 2023-06-23 DIAGNOSIS — D72829 Elevated white blood cell count, unspecified: Secondary | ICD-10-CM | POA: Diagnosis not present

## 2023-06-23 DIAGNOSIS — I10 Essential (primary) hypertension: Secondary | ICD-10-CM | POA: Diagnosis not present

## 2023-06-23 DIAGNOSIS — F418 Other specified anxiety disorders: Secondary | ICD-10-CM | POA: Diagnosis not present

## 2023-06-23 DIAGNOSIS — K921 Melena: Secondary | ICD-10-CM

## 2023-06-23 DIAGNOSIS — K625 Hemorrhage of anus and rectum: Secondary | ICD-10-CM | POA: Diagnosis not present

## 2023-06-23 DIAGNOSIS — K648 Other hemorrhoids: Secondary | ICD-10-CM | POA: Diagnosis not present

## 2023-06-23 DIAGNOSIS — E872 Acidosis, unspecified: Secondary | ICD-10-CM | POA: Diagnosis not present

## 2023-06-23 DIAGNOSIS — K7030 Alcoholic cirrhosis of liver without ascites: Secondary | ICD-10-CM | POA: Diagnosis not present

## 2023-06-23 DIAGNOSIS — K7010 Alcoholic hepatitis without ascites: Secondary | ICD-10-CM | POA: Diagnosis not present

## 2023-06-23 DIAGNOSIS — D509 Iron deficiency anemia, unspecified: Secondary | ICD-10-CM | POA: Diagnosis not present

## 2023-06-23 DIAGNOSIS — K5730 Diverticulosis of large intestine without perforation or abscess without bleeding: Secondary | ICD-10-CM | POA: Diagnosis not present

## 2023-06-23 DIAGNOSIS — R7989 Other specified abnormal findings of blood chemistry: Secondary | ICD-10-CM | POA: Diagnosis not present

## 2023-06-23 DIAGNOSIS — K922 Gastrointestinal hemorrhage, unspecified: Secondary | ICD-10-CM | POA: Diagnosis not present

## 2023-06-23 DIAGNOSIS — K6389 Other specified diseases of intestine: Secondary | ICD-10-CM | POA: Diagnosis not present

## 2023-06-23 HISTORY — PX: ESOPHAGOGASTRODUODENOSCOPY (EGD) WITH PROPOFOL: SHX5813

## 2023-06-23 LAB — CBC WITH DIFFERENTIAL/PLATELET
Abs Immature Granulocytes: 0.63 10*3/uL — ABNORMAL HIGH (ref 0.00–0.07)
Basophils Absolute: 0.1 10*3/uL (ref 0.0–0.1)
Basophils Relative: 1 %
Eosinophils Absolute: 0.1 10*3/uL (ref 0.0–0.5)
Eosinophils Relative: 1 %
HCT: 22.3 % — ABNORMAL LOW (ref 36.0–46.0)
Hemoglobin: 7.5 g/dL — ABNORMAL LOW (ref 12.0–15.0)
Immature Granulocytes: 5 %
Lymphocytes Relative: 18 %
Lymphs Abs: 2.1 10*3/uL (ref 0.7–4.0)
MCH: 29.9 pg (ref 26.0–34.0)
MCHC: 33.6 g/dL (ref 30.0–36.0)
MCV: 88.8 fL (ref 80.0–100.0)
Monocytes Absolute: 1.8 10*3/uL — ABNORMAL HIGH (ref 0.1–1.0)
Monocytes Relative: 15 %
Neutro Abs: 7 10*3/uL (ref 1.7–7.7)
Neutrophils Relative %: 60 %
Platelets: 184 10*3/uL (ref 150–400)
RBC: 2.51 MIL/uL — ABNORMAL LOW (ref 3.87–5.11)
RDW: 20.4 % — ABNORMAL HIGH (ref 11.5–15.5)
WBC: 11.7 10*3/uL — ABNORMAL HIGH (ref 4.0–10.5)
nRBC: 0.4 % — ABNORMAL HIGH (ref 0.0–0.2)

## 2023-06-23 LAB — COMPREHENSIVE METABOLIC PANEL
ALT: 20 U/L (ref 0–44)
AST: 57 U/L — ABNORMAL HIGH (ref 15–41)
Albumin: 2.3 g/dL — ABNORMAL LOW (ref 3.5–5.0)
Alkaline Phosphatase: 41 U/L (ref 38–126)
Anion gap: 12 (ref 5–15)
BUN: 22 mg/dL — ABNORMAL HIGH (ref 6–20)
CO2: 16 mmol/L — ABNORMAL LOW (ref 22–32)
Calcium: 8 mg/dL — ABNORMAL LOW (ref 8.9–10.3)
Chloride: 105 mmol/L (ref 98–111)
Creatinine, Ser: 1.26 mg/dL — ABNORMAL HIGH (ref 0.44–1.00)
GFR, Estimated: 51 mL/min — ABNORMAL LOW (ref >60)
Glucose, Bld: 112 mg/dL — ABNORMAL HIGH (ref 70–99)
Potassium: 3.2 mmol/L — ABNORMAL LOW (ref 3.5–5.1)
Sodium: 133 mmol/L — ABNORMAL LOW (ref 135–145)
Total Bilirubin: 4.3 mg/dL — ABNORMAL HIGH (ref 0.0–1.2)
Total Protein: 6.5 g/dL (ref 6.5–8.1)

## 2023-06-23 LAB — HEPATITIS PANEL, ACUTE
HCV Ab: NONREACTIVE
Hep A IgM: NONREACTIVE
Hep B C IgM: NONREACTIVE
Hepatitis B Surface Ag: NONREACTIVE

## 2023-06-23 LAB — HEMOGLOBIN AND HEMATOCRIT, BLOOD
HCT: 23.5 % — ABNORMAL LOW (ref 36.0–46.0)
Hemoglobin: 7.8 g/dL — ABNORMAL LOW (ref 12.0–15.0)

## 2023-06-23 LAB — PROTIME-INR
INR: 2.3 — ABNORMAL HIGH (ref 0.8–1.2)
Prothrombin Time: 25.2 s — ABNORMAL HIGH (ref 11.4–15.2)

## 2023-06-23 SURGERY — ESOPHAGOGASTRODUODENOSCOPY (EGD) WITH PROPOFOL
Anesthesia: Monitor Anesthesia Care

## 2023-06-23 MED ORDER — SODIUM CHLORIDE 0.9 % IV SOLN: INTRAVENOUS | Status: AC

## 2023-06-23 MED ORDER — VITAMIN K1 10 MG/ML IJ SOLN
10.0000 mg | Freq: Once | INTRAMUSCULAR | Status: AC
  Administered 2023-06-23: 10 mg via SUBCUTANEOUS
  Filled 2023-06-23: qty 1

## 2023-06-23 MED ORDER — SODIUM CHLORIDE 0.9 % IV SOLN: INTRAVENOUS | Status: DC | PRN

## 2023-06-23 MED ORDER — PEG-KCL-NACL-NASULF-NA ASC-C 100 G PO SOLR
0.5000 | Freq: Once | ORAL | Status: AC
Start: 2023-06-23 — End: 2023-06-23
  Administered 2023-06-23: 100 g via ORAL
  Filled 2023-06-23: qty 1

## 2023-06-23 MED ORDER — PREDNISOLONE 5 MG PO TABS
40.0000 mg | ORAL_TABLET | Freq: Every day | ORAL | Status: DC
  Administered 2023-06-24 – 2023-06-27 (×4): 40 mg via ORAL
  Filled 2023-06-23 (×6): qty 8

## 2023-06-23 MED ORDER — PROPOFOL 500 MG/50ML IV EMUL
INTRAVENOUS | Status: DC | PRN
  Administered 2023-06-23: 40 mg via INTRAVENOUS
  Administered 2023-06-23: 60 mg via INTRAVENOUS

## 2023-06-23 MED ORDER — METOCLOPRAMIDE HCL 5 MG/ML IJ SOLN
10.0000 mg | Freq: Once | INTRAMUSCULAR | Status: AC
Start: 2023-06-23 — End: 2023-06-23
  Administered 2023-06-23: 10 mg via INTRAVENOUS
  Filled 2023-06-23: qty 2

## 2023-06-23 MED ORDER — PEG-KCL-NACL-NASULF-NA ASC-C 100 G PO SOLR
0.5000 | Freq: Once | ORAL | Status: AC
Start: 2023-06-23 — End: 2023-06-23
  Administered 2023-06-23: 100 g via ORAL

## 2023-06-23 MED ORDER — POTASSIUM CHLORIDE 20 MEQ PO PACK
20.0000 meq | PACK | ORAL | Status: AC
  Administered 2023-06-23 (×3): 20 meq
  Filled 2023-06-23 (×2): qty 1

## 2023-06-23 MED ORDER — BISACODYL 5 MG PO TBEC
20.0000 mg | DELAYED_RELEASE_TABLET | Freq: Once | ORAL | Status: AC
  Administered 2023-06-23: 20 mg via ORAL
  Filled 2023-06-23: qty 4

## 2023-06-23 MED ORDER — SODIUM CHLORIDE 0.9 % IV SOLN: INTRAVENOUS | Status: DC

## 2023-06-23 SURGICAL SUPPLY — 14 items

## 2023-06-23 NOTE — Op Note (Signed)
 [facility] Patient Name: Elizabeth Bush Procedure Date: 06/23/2023 MRN: [id number] Attending MD: Lupita FORBES Commander , MD, [id number] Date of Birth: Jan 05, 1971 CSN: [id number] Age: 53 Admit Type: Inpatient Procedure:                Upper GI endoscopy Indications:              Hematochezia, , Anemia, hx hematochezia, alcoholoc                            liver disease w/ some ? cirrhosis Providers:                Lupita CHARLENA Commander, MD, Ritta Debbie Gentry, RN,                            Fairy Marina, Technician Referring MD:              Medicines:                Monitored Anesthesia Care Complications:            No immediate complications. Estimated Blood Loss:     Estimated blood loss: none. Procedure:                Pre-Anesthesia Assessment:                           - Prior to the procedure, a History and Physical                            was performed, and patient medications and                            allergies were reviewed. The patient's tolerance of                            previous anesthesia was also reviewed. The risks                            and benefits of the procedure and the sedation                            options and risks were discussed with the patient.                            All questions were answered, and informed consent                            was obtained. Prior Anticoagulants: The patient has                            taken no anticoagulant or antiplatelet agents. ASA                            Grade Assessment: III - A patient with severe  systemic disease. After reviewing the risks and                            benefits, the patient was deemed in satisfactory                            condition to undergo the procedure.                           After obtaining informed consent, the endoscope was                            passed under direct vision. Throughout the                             procedure, the patient's blood pressure, pulse, and                            oxygen saturations were monitored continuously. The                            [id number]-H190 ([id number]) Olympus endoscope was introduced                            through the mouth, and advanced to the second part                            of duodenum. The upper GI endoscopy was                            accomplished without difficulty. The patient                            tolerated the procedure well. Scope In: Scope Out: Findings:      The esophagus was normal.      The stomach was normal.      The examined duodenum was normal.      The cardia and gastric fundus were normal on retroflexion. Impression:               - Normal esophagus.                           - Normal stomach.                           - Normal examined duodenum.                           - No specimens collected. No signs of portal                            hypertension Moderate Sedation:      Not Applicable - Patient had care per Anesthesia. Recommendation:           - Return patient to hospital ward for ongoing care.                           -  DC ceftriazone (done)                           DC pantoprazole  (done)                           Colonoscopy tomorrow given anemia and hx of                            hematochezia - ordered                           I ordered vit K 10 mg sq x 1 again Procedure Code(s):        --- Professional ---                           [phone], Esophagogastroduodenoscopy, flexible,                            transoral; diagnostic, including collection of                            specimen(s) by brushing or washing, when performed                            (separate procedure) Diagnosis Code(s):        --- Professional ---                           K92.1, Melena (includes Hematochezia)                           D64.9, Anemia, unspecified CPT copyright 2022 [employer] Association. All rights  reserved. The codes documented in this report are preliminary and upon coder review may  be revised to meet current compliance requirements. Lupita FORBES Commander, MD 06/23/2023 12:53:37 PM This report has been signed electronically. Number of Addenda: 0

## 2023-06-23 NOTE — Interval H&P Note (Signed)
 History and Physical Interval Note:  06/23/2023 12:08 PM  Elizabeth Bush  has presented today for surgery, with the diagnosis of Acute on chronic anemia, Hematochezia.  The various methods of treatment have been discussed with the patient and family. After consideration of risks, benefits and other options for treatment, the patient has consented to  Procedure(s): ESOPHAGOGASTRODUODENOSCOPY (EGD) WITH PROPOFOL  (N/A) as a surgical intervention.  The patient's history has been reviewed, patient examined, no change in status, stable for surgery.  I have reviewed the patient's chart and labs.  Questions were answered to the patient's satisfaction.     Lupita Commander

## 2023-06-23 NOTE — Plan of Care (Signed)

## 2023-06-23 NOTE — Progress Notes (Signed)
   06/23/23 1033  TOC Brief Assessment  Insurance and Status Reviewed  Patient has primary care physician Yes  Home environment has been reviewed apartment w/ children  Prior level of function: independent  Prior/Current Home Services No current home services  Social Drivers of Health Review SDOH reviewed no interventions necessary  Readmission risk has been reviewed Yes  Transition of care needs no transition of care needs at this time

## 2023-06-23 NOTE — Progress Notes (Signed)
 Called the patient's daughter Ms. Rubby Barbary to get a telephone consent, she said she will come to the hospital to sign consent for the procedure instead.

## 2023-06-23 NOTE — Progress Notes (Addendum)
 Progress Note   Subjective  Hospital day #3 Chief Complaint: Anemia in the setting of alcoholic cirrhosis with reported GI bleed  This morning patient is more clearheaded and able to ask questions and also answer my own.  We discussed her upcoming EGD today.  I explained this to her and answered her questions.  Also discussed that Prednisolone  was added for alcoholic hepatitis yesterday.  Explained how important it is for her to abstain from alcohol  going home.    Also complains of a cough this morning.  Tells me she thinks has been going on for a while but is really just noticed that it is just continuous since being in the hospital.   Objective   Vital signs in last 24 hours: Temp:  [97.6 F (36.4 C)-98.8 F (37.1 C)] 97.8 F (36.6 C) (02/05 0355) Pulse Rate:  [78-99] 93 (02/05 0355) Resp:  [16-18] 18 (02/05 0355) BP: (95-125)/(66-72) 125/72 (02/05 0355) SpO2:  [99 %-100 %] 100 % (02/05 0355) Last BM Date : 06/21/23 General:    AA female in NAD Heart:  Regular rate and rhythm; no murmurs Lungs: Respirations even and unlabored, lungs CTA bilaterally Abdomen:  Soft, nontender and nondistended. Normal bowel sounds. Psych:  Cooperative. Normal mood and affect.  Intake/Output from previous day: 02/04 0701 - 02/05 0700 In: 432.7 [P.O.:240; IV Piggyback:192.7] Out: -    Lab Results: Recent Labs    06/21/23 1320 06/21/23 1907 06/22/23 0445 06/22/23 1236 06/23/23 0441  WBC 21.4*  --  15.9*  --  11.7*  HGB 7.9*   < > 6.9* 8.5* 7.5*  HCT 23.1*   < > 20.6* 25.5* 22.3*  PLT 232  --  195  --  184   < > = values in this interval not displayed.   BMET Recent Labs    06/21/23 1320 06/22/23 0445 06/23/23 0441  NA 132* 129* 133*  K 4.0 4.0 3.2*  CL 103 102 105  CO2 15* 18* 16*  GLUCOSE 113* 142* 112*  BUN 28* 29* 22*  CREATININE 1.68* 1.93* 1.26*  CALCIUM  8.1* 7.6* 8.0*      Latest Ref Rng & Units 06/23/2023    4:41 AM 06/22/2023    4:45 AM 06/21/2023    1:20 PM   Hepatic Function  Total Protein 6.5 - 8.1 g/dL 6.5  6.2  7.4   Albumin  3.5 - 5.0 g/dL 2.3  <8.4  1.9   AST 15 - 41 U/L 57  62  79   ALT 0 - 44 U/L 20  24  30    Alk Phosphatase 38 - 126 U/L 41  61  63   Total Bilirubin 0.0 - 1.2 mg/dL 4.3  3.9  5.2      PT/INR Recent Labs    06/22/23 0445 06/23/23 0441  LABPROT 25.2* 25.2*  INR 2.3* 2.3*    Studies/Results: US  ASCITES (ABDOMEN LIMITED) Result Date: 06/22/2023 CLINICAL DATA:  Ascites check. EXAM: LIMITED ABDOMEN ULTRASOUND FOR ASCITES TECHNIQUE: Limited ultrasound survey for ascites was performed in all four abdominal quadrants. COMPARISON:  None Available. FINDINGS: No abdominal free fluid is seen within any of the 4 abdominal quadrants. IMPRESSION: No evidence of ascites. Electronically Signed   By: Suzen Dials M.D.   On: 06/22/2023 17:44     Assessment / Plan:   Assessment: 1.  Acute GI bleed: Reported maroon/dark stool, hemoglobin 4.1--> 1 unit PRBCs--> 5.5--> 2 units PRBCs--> 7.9--> 10.8--> 6.9--> 1 unit PRBCs 8.5--> 0.5, still no  signs of GI bleed since in the hospital; suspect upper GI bleed most likely 2.  Alcohol  abuse with question of cirrhosis and alcoholic hepatitis: DF score on 2//25 was 60, acute hepatitis panel negative, started on Prednisolone  3.  Elevated BUN/creatinine: Some concern over HRS, improvement in creatinine overnight after a dose of albumin  4.  Leukocytosis: Improving 5.  Cough: Patient mentioned this morning, will suggest respiratory panel and possible chest x-ray due to the hospitalist  Plan: 1.  EGD today with Dr. Avram.  Went ahead and reviewed risks, benefits, limitations and alternatives the patient as she was more clearheaded this morning.  She agrees to proceed. 2.  Patient be n.p.o. until after time procedure. 3.  Continue to monitor CBC/CMP and PT/INR 4.  Started patient on Prednisolone  40 mg daily-will need to continue this for 28 days, recommend calculating Lille score on day 7 to  determine if responding 5.  Would recommend possibly checking a respiratory panel and chest x-ray due to continued cough 6.  Please await any further recommendations after time of procedure today.  Thank you for your kind consultation, we will continue to follow.   LOS: 3 days   Delon Hendricks Failing  06/23/2023, 9:35 AM   GI attending:  Improved overall. Lungs are clear, she id coughing intermittently.  EGD today  Lupita CHARLENA Avram, MD, [facility] See TRACEY on call - gastroenterology for best contact person 06/23/2023 12:07 PM

## 2023-06-23 NOTE — Anesthesia Preprocedure Evaluation (Addendum)
 Anesthesia Evaluation  Patient identified by MRN, date of birth, ID band Patient awake    Reviewed: Allergy & Precautions, NPO status , Patient's Chart, lab work & pertinent test results  Airway Mallampati: III  TM Distance: >3 FB Neck ROM: Full    Dental  (+) Teeth Intact, Dental Advisory Given   Pulmonary Current Smoker and Patient abstained from smoking.   Pulmonary exam normal breath sounds clear to auscultation       Cardiovascular hypertension, Normal cardiovascular exam Rhythm:Regular Rate:Normal     Neuro/Psych  PSYCHIATRIC DISORDERS Anxiety Depression    negative neurological ROS     GI/Hepatic ,GERD  ,,(+) Cirrhosis     substance abuse  alcohol  use, Hepatitis -hematemesis and hematochezia   Endo/Other  negative endocrine ROS    Renal/GU Renal disease (AKI)     Musculoskeletal  (+) Arthritis ,    Abdominal   Peds  Hematology  (+) Blood dyscrasia, anemia   Anesthesia Other Findings   Reproductive/Obstetrics                             Anesthesia Physical Anesthesia Plan  ASA: 3  Anesthesia Plan: MAC   Post-op Pain Management:    Induction: Intravenous  PONV Risk Score and Plan: 1 and TIVA and Treatment may vary due to age or medical condition  Airway Management Planned: Natural Airway and Simple Face Mask  Additional Equipment:   Intra-op Plan:   Post-operative Plan:   Informed Consent: I have reviewed the patients History and Physical, chart, labs and discussed the procedure including the risks, benefits and alternatives for the proposed anesthesia with the patient or authorized representative who has indicated his/her understanding and acceptance.     Dental advisory given  Plan Discussed with: CRNA  Anesthesia Plan Comments:         Anesthesia Quick Evaluation

## 2023-06-23 NOTE — Plan of Care (Signed)
   Problem: Elimination: Goal: Will not experience complications related to bowel motility Outcome: Progressing Goal: Will not experience complications related to urinary retention Outcome: Progressing   Problem: Pain Managment: Goal: General experience of comfort will improve and/or be controlled Outcome: Progressing   Problem: Safety: Goal: Ability to remain free from injury will improve Outcome: Progressing

## 2023-06-23 NOTE — H&P (View-Only) (Signed)
 Progress Note   Subjective  Hospital day #3 Chief Complaint: Anemia in the setting of alcoholic cirrhosis with reported GI bleed  This morning patient is more clearheaded and able to ask questions and also answer my own.  We discussed her upcoming EGD today.  I explained this to her and answered her questions.  Also discussed that Prednisolone  was added for alcoholic hepatitis yesterday.  Explained how important it is for her to abstain from alcohol  going home.    Also complains of a cough this morning.  Tells me she thinks has been going on for a while but is really just noticed that it is just continuous since being in the hospital.   Objective   Vital signs in last 24 hours: Temp:  [97.6 F (36.4 C)-98.8 F (37.1 C)] 97.8 F (36.6 C) (02/05 0355) Pulse Rate:  [78-99] 93 (02/05 0355) Resp:  [16-18] 18 (02/05 0355) BP: (95-125)/(66-72) 125/72 (02/05 0355) SpO2:  [99 %-100 %] 100 % (02/05 0355) Last BM Date : 06/21/23 General:    AA female in NAD Heart:  Regular rate and rhythm; no murmurs Lungs: Respirations even and unlabored, lungs CTA bilaterally Abdomen:  Soft, nontender and nondistended. Normal bowel sounds. Psych:  Cooperative. Normal mood and affect.  Intake/Output from previous day: 02/04 0701 - 02/05 0700 In: 432.7 [P.O.:240; IV Piggyback:192.7] Out: -    Lab Results: Recent Labs    06/21/23 1320 06/21/23 1907 06/22/23 0445 06/22/23 1236 06/23/23 0441  WBC 21.4*  --  15.9*  --  11.7*  HGB 7.9*   < > 6.9* 8.5* 7.5*  HCT 23.1*   < > 20.6* 25.5* 22.3*  PLT 232  --  195  --  184   < > = values in this interval not displayed.   BMET Recent Labs    06/21/23 1320 06/22/23 0445 06/23/23 0441  NA 132* 129* 133*  K 4.0 4.0 3.2*  CL 103 102 105  CO2 15* 18* 16*  GLUCOSE 113* 142* 112*  BUN 28* 29* 22*  CREATININE 1.68* 1.93* 1.26*  CALCIUM  8.1* 7.6* 8.0*      Latest Ref Rng & Units 06/23/2023    4:41 AM 06/22/2023    4:45 AM 06/21/2023    1:20 PM   Hepatic Function  Total Protein 6.5 - 8.1 g/dL 6.5  6.2  7.4   Albumin  3.5 - 5.0 g/dL 2.3  <8.4  1.9   AST 15 - 41 U/L 57  62  79   ALT 0 - 44 U/L 20  24  30    Alk Phosphatase 38 - 126 U/L 41  61  63   Total Bilirubin 0.0 - 1.2 mg/dL 4.3  3.9  5.2      PT/INR Recent Labs    06/22/23 0445 06/23/23 0441  LABPROT 25.2* 25.2*  INR 2.3* 2.3*    Studies/Results: US  ASCITES (ABDOMEN LIMITED) Result Date: 06/22/2023 CLINICAL DATA:  Ascites check. EXAM: LIMITED ABDOMEN ULTRASOUND FOR ASCITES TECHNIQUE: Limited ultrasound survey for ascites was performed in all four abdominal quadrants. COMPARISON:  None Available. FINDINGS: No abdominal free fluid is seen within any of the 4 abdominal quadrants. IMPRESSION: No evidence of ascites. Electronically Signed   By: Suzen Dials M.D.   On: 06/22/2023 17:44     Assessment / Plan:   Assessment: 1.  Acute GI bleed: Reported maroon/dark stool, hemoglobin 4.1--> 1 unit PRBCs--> 5.5--> 2 units PRBCs--> 7.9--> 10.8--> 6.9--> 1 unit PRBCs 8.5--> 0.5, still no  signs of GI bleed since in the hospital; suspect upper GI bleed most likely 2.  Alcohol  abuse with question of cirrhosis and alcoholic hepatitis: DF score on 2//25 was 60, acute hepatitis panel negative, started on Prednisolone  3.  Elevated BUN/creatinine: Some concern over HRS, improvement in creatinine overnight after a dose of albumin  4.  Leukocytosis: Improving 5.  Cough: Patient mentioned this morning, will suggest respiratory panel and possible chest x-ray due to the hospitalist  Plan: 1.  EGD today with Dr. Avram.  Went ahead and reviewed risks, benefits, limitations and alternatives the patient as she was more clearheaded this morning.  She agrees to proceed. 2.  Patient be n.p.o. until after time procedure. 3.  Continue to monitor CBC/CMP and PT/INR 4.  Started patient on Prednisolone  40 mg daily-will need to continue this for 28 days, recommend calculating Lille score on day 7 to  determine if responding 5.  Would recommend possibly checking a respiratory panel and chest x-ray due to continued cough 6.  Please await any further recommendations after time of procedure today.  Thank you for your kind consultation, we will continue to follow.   LOS: 3 days   Delon Hendricks Failing  06/23/2023, 9:35 AM   GI attending:  Improved overall. Lungs are clear, she id coughing intermittently.  EGD today  Lupita CHARLENA Avram, MD, [facility] See TRACEY on call - gastroenterology for best contact person 06/23/2023 12:07 PM

## 2023-06-23 NOTE — Transfer of Care (Signed)
 Immediate Anesthesia Transfer of Care Note  Patient: Elizabeth Bush  Procedure(s) Performed: Procedure(s): ESOPHAGOGASTRODUODENOSCOPY (EGD) WITH PROPOFOL  (N/A)  Patient Location: PACU and Endoscopy Unit  Anesthesia Type:MAC  Level of Consciousness: awake, alert  and oriented  Airway & Oxygen Therapy: Patient Spontanous Breathing and Patient connected to nasal cannula oxygen  Post-op Assessment: Report given to RN and Post -op Vital signs reviewed and stable  Post vital signs: Reviewed and stable  Last Vitals:  Vitals:   06/23/23 0355 06/23/23 1149  BP: 125/72 126/63  Pulse: 93 82  Resp: 18 14  Temp: 36.6 C (!) 36.3 C  SpO2: 100% 98%    Complications: No apparent anesthesia complications

## 2023-06-23 NOTE — Anesthesia Postprocedure Evaluation (Signed)
 Anesthesia Post Note  Patient: Elizabeth Bush  Procedure(s) Performed: ESOPHAGOGASTRODUODENOSCOPY (EGD) WITH PROPOFOL      Patient location during evaluation: Endoscopy Anesthesia Type: MAC Level of consciousness: oriented, awake and alert and awake Pain management: pain level controlled Vital Signs Assessment: post-procedure vital signs reviewed and stable Respiratory status: spontaneous breathing, nonlabored ventilation and respiratory function stable Cardiovascular status: blood pressure returned to baseline and stable Postop Assessment: no headache, no backache and no apparent nausea or vomiting Anesthetic complications: no   No notable events documented.  Last Vitals:  Vitals:   06/23/23 1255 06/23/23 1300  BP:  134/89  Pulse: 86 88  Resp: 19 18  Temp:    SpO2: 95% 96%    Last Pain:  Vitals:   06/23/23 1300  TempSrc:   PainSc: 0-No pain                 Garnette FORBES Skillern

## 2023-06-23 NOTE — Progress Notes (Signed)
  Progress Note   Patient: Elizabeth Bush FMW:[id number] DOB: [birth year] DOA: 06/20/2023     3 DOS: the patient was seen and examined on 06/23/2023   Brief hospital course: 53 year old female with history of left TKA, depression, hypertension, alcohol  use, presenting 06/19/22 to [facility] ED with increased weakness found to have hemoglobin 4.1 on presentation.  Assessment and Plan:  Acute on chronic blood loss anemia - Hemoglobin 4.1 on presentation.  Iron  studies suggest chronic iron  deficiency anemia as well.  Status post 3 units PRBCs.  Hemoglobin currently stabilizing just are 8.  Will continue to trend hemoglobin.  Transfuse if hemoglobin less than 7.  Likely acute upper GI bleed - Patient stated she had noticed dark stools especially when she was drinking alcohol .  GI following closely.  Scheduled for EGD later this morning.  Likely colonoscopy tomorrow.  Currently NPO.  EtOH abuse with possible cirrhosis and possible alcoholic hepatitis - MELD score 60.  GI following closely.  Currently on prednisolone  therapy (total 28 days).  Acute hepatitis panel negative.  Abdominal ultrasound noting no evidence of ascites.  LFTs only minimally evaded.  Will recheck Lille score on day 7.  Acute kidney injury concern for hepatorenal syndrome - No improvement after PRBCs and albumin  infusion.  Creatinine downtrending from presentation.  Will recheck BMP in AM.  Hypokalemia - Replenishment protocol initiated.     Subjective: Patient resting comfortably this morning.  Denies any fever, chills, chest pain, nausea, vomiting, abdominal pain.  Still having a bit of uncomfortable feeling in the epigastric region.  Currently n.p.o., scheduled for EGD later today.  Physical Exam: Vitals:   06/23/23 1240 06/23/23 1250 06/23/23 1255 06/23/23 1300  BP: 118/76 123/68  134/89  Pulse: 87 82 86 88  Resp: 17 20 19 18   Temp: 98.4 F (36.9 C)     TempSrc: Temporal     SpO2: 99% 97% 95% 96%  Weight:      Height:        GENERAL:  Alert, pleasant, no acute distress  HEENT:  EOMI CARDIOVASCULAR:  RRR, no murmurs appreciated RESPIRATORY:  Clear to auscultation, no wheezing, rales, or rhonchi GASTROINTESTINAL:  Soft, nontender, nondistended EXTREMITIES:  No LE edema bilaterally NEURO:  No new focal deficits appreciated SKIN:  No rashes noted PSYCH:  Appropriate mood and affect   Data Reviewed:  Results are pending, will review when available.  Family Communication: None at bedside  Disposition: Status is: Inpatient Remains inpatient appropriate because: Ongoing acute GI bleed  Planned Discharge Destination: Home    Time spent: 37 minutes  Author: Carliss LELON Canales, DO 06/23/2023 2:43 PM  For on call review [email or web address].

## 2023-06-24 ENCOUNTER — Inpatient Hospital Stay (HOSPITAL_COMMUNITY): Payer: Medicaid Other | Admitting: Anesthesiology

## 2023-06-24 ENCOUNTER — Encounter (HOSPITAL_COMMUNITY): Payer: Self-pay | Admitting: Pulmonary Disease

## 2023-06-24 ENCOUNTER — Encounter (HOSPITAL_COMMUNITY)
Admission: EM | Disposition: A | Discharge status: Home / Self Care | Payer: Self-pay | Source: Home / Self Care | Attending: Internal Medicine

## 2023-06-24 DIAGNOSIS — K921 Melena: Secondary | ICD-10-CM

## 2023-06-24 DIAGNOSIS — K922 Gastrointestinal hemorrhage, unspecified: Secondary | ICD-10-CM | POA: Diagnosis not present

## 2023-06-24 DIAGNOSIS — K648 Other hemorrhoids: Secondary | ICD-10-CM

## 2023-06-24 DIAGNOSIS — K7010 Alcoholic hepatitis without ascites: Secondary | ICD-10-CM | POA: Diagnosis not present

## 2023-06-24 DIAGNOSIS — E872 Acidosis, unspecified: Secondary | ICD-10-CM | POA: Diagnosis not present

## 2023-06-24 DIAGNOSIS — I10 Essential (primary) hypertension: Secondary | ICD-10-CM | POA: Diagnosis not present

## 2023-06-24 DIAGNOSIS — K635 Polyp of colon: Secondary | ICD-10-CM | POA: Diagnosis not present

## 2023-06-24 DIAGNOSIS — D72829 Elevated white blood cell count, unspecified: Secondary | ICD-10-CM | POA: Diagnosis not present

## 2023-06-24 DIAGNOSIS — K7030 Alcoholic cirrhosis of liver without ascites: Secondary | ICD-10-CM | POA: Diagnosis not present

## 2023-06-24 DIAGNOSIS — K6389 Other specified diseases of intestine: Secondary | ICD-10-CM

## 2023-06-24 DIAGNOSIS — K5730 Diverticulosis of large intestine without perforation or abscess without bleeding: Secondary | ICD-10-CM

## 2023-06-24 DIAGNOSIS — K649 Unspecified hemorrhoids: Secondary | ICD-10-CM | POA: Diagnosis not present

## 2023-06-24 DIAGNOSIS — K5790 Diverticulosis of intestine, part unspecified, without perforation or abscess without bleeding: Secondary | ICD-10-CM | POA: Diagnosis not present

## 2023-06-24 DIAGNOSIS — D649 Anemia, unspecified: Secondary | ICD-10-CM

## 2023-06-24 DIAGNOSIS — K625 Hemorrhage of anus and rectum: Secondary | ICD-10-CM | POA: Diagnosis not present

## 2023-06-24 DIAGNOSIS — R7989 Other specified abnormal findings of blood chemistry: Secondary | ICD-10-CM | POA: Diagnosis not present

## 2023-06-24 DIAGNOSIS — D509 Iron deficiency anemia, unspecified: Secondary | ICD-10-CM | POA: Diagnosis not present

## 2023-06-24 HISTORY — PX: COLONOSCOPY WITH PROPOFOL: SHX5780

## 2023-06-24 HISTORY — PX: BIOPSY: SHX5522

## 2023-06-24 LAB — BPAM RBC
Blood Product Expiration Date: 202503012359
Blood Product Expiration Date: 202503012359
Blood Product Expiration Date: 202503082359
Blood Product Unit Number: 202503012359
Blood Product Unit Number: 202503012359
ISSUE DATE / TIME: 202502040624
ISSUE DATE / TIME: 202502030812
PRODUCT CODE: 202502022147
PRODUCT CODE: 202502030322
PRODUCT CODE: 202503012359
PRODUCT CODE: 202503012359
Unit Type and Rh: 202503012359
Unit Type and Rh: 7300
Unit Type and Rh: 7300
Unit Type and Rh: 7300
Unit Type and Rh: 202502022147
Unit Type and Rh: 202503012359
Unit Type and Rh: 7300
Unit Type and Rh: 7300
Unit Type and Rh: 7300

## 2023-06-24 LAB — COMPREHENSIVE METABOLIC PANEL
ALT: 28 U/L (ref 0–44)
AST: 93 U/L — ABNORMAL HIGH (ref 15–41)
Albumin: 3 g/dL — ABNORMAL LOW (ref 3.5–5.0)
Alkaline Phosphatase: 52 U/L (ref 38–126)
Anion gap: 13 (ref 5–15)
BUN: 20 mg/dL (ref 6–20)
CO2: 16 mmol/L — ABNORMAL LOW (ref 22–32)
Calcium: 8.5 mg/dL — ABNORMAL LOW (ref 8.9–10.3)
Chloride: 103 mmol/L (ref 98–111)
Creatinine, Ser: 1.39 mg/dL — ABNORMAL HIGH (ref 0.44–1.00)
GFR, Estimated: 45 mL/min — ABNORMAL LOW (ref >60)
Glucose, Bld: 113 mg/dL — ABNORMAL HIGH (ref 70–99)
Potassium: 2.9 mmol/L — ABNORMAL LOW (ref 3.5–5.1)
Sodium: 132 mmol/L — ABNORMAL LOW (ref 135–145)
Total Bilirubin: 4.9 mg/dL — ABNORMAL HIGH (ref 0.0–1.2)
Total Protein: 8.2 g/dL — ABNORMAL HIGH (ref 6.5–8.1)

## 2023-06-24 LAB — TYPE AND SCREEN
ABO/RH(D): B POS
Antibody Screen: NEGATIVE
Unit division: 0
Unit division: 0
Unit division: 0
Unit division: 0
Unit division: 0

## 2023-06-24 LAB — CBC WITH DIFFERENTIAL/PLATELET
Abs Immature Granulocytes: 0.39 10*3/uL — ABNORMAL HIGH (ref 0.00–0.07)
Basophils Absolute: 0.1 10*3/uL (ref 0.0–0.1)
Basophils Relative: 1 %
Eosinophils Absolute: 0.1 10*3/uL (ref 0.0–0.5)
Eosinophils Relative: 1 %
HCT: 27.5 % — ABNORMAL LOW (ref 36.0–46.0)
Hemoglobin: 8.9 g/dL — ABNORMAL LOW (ref 12.0–15.0)
Immature Granulocytes: 3 %
Lymphocytes Relative: 16 %
Lymphs Abs: 1.9 10*3/uL (ref 0.7–4.0)
MCH: 29.4 pg (ref 26.0–34.0)
MCHC: 32.4 g/dL (ref 30.0–36.0)
MCV: 90.8 fL (ref 80.0–100.0)
Monocytes Absolute: 1.6 10*3/uL — ABNORMAL HIGH (ref 0.1–1.0)
Monocytes Relative: 13 %
Neutro Abs: 7.9 10*3/uL — ABNORMAL HIGH (ref 1.7–7.7)
Neutrophils Relative %: 66 %
Platelets: 166 10*3/uL (ref 150–400)
RBC: 3.03 MIL/uL — ABNORMAL LOW (ref 3.87–5.11)
RDW: 21.2 % — ABNORMAL HIGH (ref 11.5–15.5)
WBC: 12.1 10*3/uL — ABNORMAL HIGH (ref 4.0–10.5)
nRBC: 0.2 % (ref 0.0–0.2)

## 2023-06-24 LAB — HEMOGLOBIN AND HEMATOCRIT, BLOOD
HCT: 25.7 % — ABNORMAL LOW (ref 36.0–46.0)
Hemoglobin: 8.6 g/dL — ABNORMAL LOW (ref 12.0–15.0)

## 2023-06-24 LAB — PROTIME-INR
INR: 1.7 — ABNORMAL HIGH (ref 0.8–1.2)
Prothrombin Time: 19.9 s — ABNORMAL HIGH (ref 11.4–15.2)

## 2023-06-24 LAB — MAGNESIUM: Magnesium: 2 mg/dL (ref 1.7–2.4)

## 2023-06-24 SURGERY — COLONOSCOPY WITH PROPOFOL
Anesthesia: Monitor Anesthesia Care

## 2023-06-24 MED ORDER — PROPOFOL 10 MG/ML IV BOLUS
INTRAVENOUS | Status: DC | PRN
  Administered 2023-06-24 (×2): 20 mg via INTRAVENOUS
  Administered 2023-06-24: 80 mg via INTRAVENOUS

## 2023-06-24 MED ORDER — POTASSIUM CHLORIDE 10 MEQ/100ML IV SOLN
10.0000 meq | INTRAVENOUS | Status: AC
  Administered 2023-06-24: 10 meq via INTRAVENOUS
  Filled 2023-06-24 (×2): qty 100

## 2023-06-24 MED ORDER — HYDROCORTISONE (PERIANAL) 2.5 % EX CREA
TOPICAL_CREAM | Freq: Two times a day (BID) | CUTANEOUS | Status: DC
  Filled 2023-06-24: qty 28.35

## 2023-06-24 MED ORDER — LIDOCAINE 2% (20 MG/ML) 5 ML SYRINGE
INTRAMUSCULAR | Status: DC | PRN
  Administered 2023-06-24: 40 mg via INTRAVENOUS

## 2023-06-24 MED ORDER — SODIUM CHLORIDE 0.9 % IV SOLN: INTRAVENOUS | Status: DC | PRN

## 2023-06-24 MED ORDER — POTASSIUM CHLORIDE 20 MEQ PO PACK
40.0000 meq | PACK | Freq: Once | ORAL | Status: AC
  Administered 2023-06-24: 40 meq via ORAL
  Filled 2023-06-24: qty 2

## 2023-06-24 MED ORDER — PROPOFOL 500 MG/50ML IV EMUL
INTRAVENOUS | Status: DC | PRN
  Administered 2023-06-24: 120 ug/kg/min via INTRAVENOUS

## 2023-06-24 SURGICAL SUPPLY — 20 items
ELECT REM PT RETURN 9FT ADLT (ELECTROSURGICAL) IMPLANT
ELECTRODE REM PT RTRN 9FT ADLT (ELECTROSURGICAL) IMPLANT
FLOOR PAD 36X40 (MISCELLANEOUS) ×2 IMPLANT
FORCEPS BIOP RAD 4 LRG CAP 4 (CUTTING FORCEPS) IMPLANT
FORCEPS BIOP RJ4 240 W/NDL (CUTTING FORCEPS) IMPLANT
FORCEPS BXJMBJMB 240X2.8X (CUTTING FORCEPS) IMPLANT
INJECTOR/SNARE I SNARE (MISCELLANEOUS) IMPLANT
LUBRICANT JELLY 4.5OZ STERILE (MISCELLANEOUS) IMPLANT
MANIFOLD NEPTUNE II (INSTRUMENTS) IMPLANT
NDL SCLEROTHERAPY 25GX240 (NEEDLE) IMPLANT
NEEDLE SCLEROTHERAPY 25GX240 (NEEDLE) IMPLANT
PAD FLOOR 36X40 (MISCELLANEOUS) ×3 IMPLANT
PROBE APC STR FIRE (PROBE) IMPLANT
PROBE INJECTION GOLD 7FR (MISCELLANEOUS) IMPLANT
SNARE ROTATE MED OVAL 20MM (MISCELLANEOUS) IMPLANT
SYR 50ML LL SCALE MARK (SYRINGE) IMPLANT
TRAP SPECIMEN MUCOUS 40CC (MISCELLANEOUS) IMPLANT
TUBING ENDO SMARTCAP PENTAX (MISCELLANEOUS) IMPLANT
TUBING IRRIGATION ENDOGATOR (MISCELLANEOUS) ×3 IMPLANT
WATER STERILE IRR 1000ML POUR (IV SOLUTION) IMPLANT

## 2023-06-24 NOTE — Interval H&P Note (Signed)
 History and Physical Interval Note:  06/24/2023 12:40 PM  Elizabeth Bush  has presented today for surgery, with the diagnosis of severe anemia, hematochezia.  The various methods of treatment have been discussed with the patient and family. After consideration of risks, benefits and other options for treatment, the patient has consented to  Procedure(s): COLONOSCOPY WITH PROPOFOL  (N/A) as a surgical intervention.  The patient's history has been reviewed, patient examined, no change in status, stable for surgery.  I have reviewed the patient's chart and labs.  Questions were answered to the patient's satisfaction.     Lupita Commander

## 2023-06-24 NOTE — Progress Notes (Signed)
 Progress Note   Subjective  Hospital day #4 Chief Complaint: Anemia in the setting of alcoholic hepatitis (question of cirrhosis) with reported GI bleed  Today, patient is found sitting on her bedside commode, explains that she was up all night passing watery stool.  Tells me she just had some more, when she stands up I see the toilet is full of just bright red bloody water .  She also has some residue on the towel she had been sitting on.  Denies any new complaints or concerns.   Objective   Vital signs in last 24 hours: Temp:  [97.4 F (36.3 C)-98.4 F (36.9 C)] 97.6 F (36.4 C) (02/06 0559) Pulse Rate:  [82-90] 89 (02/06 0559) Resp:  [14-20] 17 (02/06 0559) BP: (118-134)/(63-89) 130/75 (02/06 0559) SpO2:  [95 %-100 %] 95 % (02/06 0559) Last BM Date : 06/24/23 General: AA female in NAD Heart:  Regular rate and rhythm; no murmurs Lungs: Respirations even and unlabored, lungs CTA bilaterally Abdomen:  Soft, nontender and nondistended. Normal bowel sounds. Psych:  Cooperative. Normal mood and affect.  Intake/Output from previous day: 02/05 0701 - 02/06 0700 In: 250 [I.V.:250] Out: -   Lab Results: Recent Labs    06/22/23 0445 06/22/23 1236 06/23/23 0441 06/23/23 1333 06/24/23 0135  WBC 15.9*  --  11.7*  --  12.1*  HGB 6.9*   < > 7.5* 7.8* 8.9*  HCT 20.6*   < > 22.3* 23.5* 27.5*  PLT 195  --  184  --  166   < > = values in this interval not displayed.   BMET Recent Labs    06/22/23 0445 06/23/23 0441 06/24/23 0135  NA 129* 133* 132*  K 4.0 3.2* 2.9*  CL 102 105 103  CO2 18* 16* 16*  GLUCOSE 142* 112* 113*  BUN 29* 22* 20  CREATININE 1.93* 1.26* 1.39*  CALCIUM  7.6* 8.0* 8.5*      Latest Ref Rng & Units 06/24/2023    1:35 AM 06/23/2023    4:41 AM 06/22/2023    4:45 AM  Hepatic Function  Total Protein 6.5 - 8.1 g/dL 8.2  6.5  6.2   Albumin  3.5 - 5.0 g/dL 3.0  2.3  <8.4   AST 15 - 41 U/L 93  57  62   ALT 0 - 44 U/L 28  20  24    Alk Phosphatase 38 - 126  U/L 52  41  61   Total Bilirubin 0.0 - 1.2 mg/dL 4.9  4.3  3.9      PT/INR Recent Labs    06/23/23 0441 06/24/23 0135  LABPROT 25.2* 19.9*  INR 2.3* 1.7*    Studies/Results: US  ASCITES (ABDOMEN LIMITED) Result Date: 06/22/2023 CLINICAL DATA:  Ascites check. EXAM: LIMITED ABDOMEN ULTRASOUND FOR ASCITES TECHNIQUE: Limited ultrasound survey for ascites was performed in all four abdominal quadrants. COMPARISON:  None Available. FINDINGS: No abdominal free fluid is seen within any of the 4 abdominal quadrants. IMPRESSION: No evidence of ascites. Electronically Signed   By: Suzen Dials M.D.   On: 06/22/2023 17:44    Assessment / Plan:   Assessment: 1.  Acute GI bleed: Reported maroon/dark stool initially, hemoglobin 4.1--> 1 unit PRBCs--> 5.5--> 2 units PRBCs--> 7.9--> 10.8--> 6.9--> 1 unit PRBCs--> 8.5--> 7.5--> 7.8--> 8.9, bright red blood in the toilet today after bowel prep over the evening, EGD yesterday with no etiology; likely lower GI bleed 2.  Alcohol  abuse with question of cirrhosis and alcoholic hepatitis: Creatinine  slightly increased today, DF score in 06/22/2023 ewas 60-Prednisolone  was initiated on 06/23/2023, acute hepatitis panel negative 3.  Leukocytosis: Improving 4.  Cough 5. Hypokalemia  Plan: 1.  Patient had EGD yesterday with Dr. Avram.  Reviewed.  She is scheduled for colonoscopy today.  Seems to be running clear minus bright red blood. 2.  Ordered some potassium this morning, 40 mEq p.o. and 4 rounds of 10 mg IV per recommendations from Dr. Avram. 3.  Continue n.p.o. for now. 4.  Further recommendations to come after time procedure today.  Thank you for your kind consultation, we will continue to follow.   LOS: 4 days   Elizabeth Bush Failing  06/24/2023, 9:35 AM

## 2023-06-24 NOTE — Progress Notes (Signed)
  Progress Note   Patient: Elizabeth Bush FMW:[id number] DOB: 1970-09-27 DOA: 06/20/2023     4 DOS: the patient was seen and examined on 06/24/2023   Brief hospital course: 53 year old female with history of left TKA, depression, hypertension, alcohol  use, presenting 06/19/22 to [facility] ED with increased weakness found to have hemoglobin 4.1 on presentation.  Assessment and Plan:  Acute on chronic blood loss anemia - Hemoglobin 4.1 on presentation.  Iron  studies suggest chronic iron  deficiency anemia as well.  Status post 3 units PRBCs.  Hemoglobin currently stabilizing.  Will continue to trend hemoglobin.  Transfuse if hemoglobin less than 7.  Likely acute GI bleed - Patient stated she had noticed dark stools especially when she was drinking alcohol .  GI following closely.  EGD largely unremarkable, no acute bleeding appreciated.  Cajoled for colonoscopy later this morning.  Will recheck CBC in a.m.  EtOH abuse with possible cirrhosis and possible alcoholic hepatitis - MELD score 60.  GI following closely.  Currently on prednisolone  therapy (total 28 days).  Acute hepatitis panel negative.  Abdominal ultrasound noting no evidence of ascites.  LFTs only minimally evaded.  Will recheck Lille score on day 7.  Acute kidney injury concern for hepatorenal syndrome - Showing mild improvement after PRBCs and albumin  infusion.  Creatinine downtrending from presentation.  Will recheck BMP in AM.  Hypokalemia - Worse this morning.  Likely exacerbated by colonoscopy prep/GI wasting.  Replenishment protocol initiated.     Subjective: Patient resting comfortably this morning.  Had a lot of bowel movements secondary to bowel prep.  Denies any fever, chills, chest pain, nausea, vomiting, abdominal pain.  Controlled for colonoscopy later this morning Physical Exam: Vitals:   06/24/23 1320 06/24/23 1330 06/24/23 1340 06/24/23 1356  BP: (!) 96/55 118/69 125/62 122/73  Pulse: 84 88 80 78  Resp: 19 20 18    Temp:     (!) 97.5 F (36.4 C)  TempSrc:    Oral  SpO2: 97% 98% 100% 100%  Weight:      Height:       GENERAL:  Alert, pleasant, no acute distress  HEENT:  EOMI CARDIOVASCULAR:  RRR, no murmurs appreciated RESPIRATORY:  Clear to auscultation, no wheezing, rales, or rhonchi GASTROINTESTINAL:  Soft, nontender, nondistended EXTREMITIES:  No LE edema bilaterally NEURO:  No new focal deficits appreciated SKIN:  No rashes noted PSYCH:  Appropriate mood and affect   Data Reviewed:  Results are pending, will review when available.  Family Communication: None at bedside  Disposition: Status is: Inpatient Remains inpatient appropriate because: Ongoing acute GI bleed  Planned Discharge Destination: Home    Time spent: 37 minutes  Author: Carliss LELON Canales, DO 06/24/2023 2:21 PM  For on call review [email or web address].

## 2023-06-24 NOTE — Anesthesia Postprocedure Evaluation (Signed)
 Anesthesia Post Note  Patient: Elizabeth Bush  Procedure(s) Performed: COLONOSCOPY WITH PROPOFOL  BIOPSY     Patient location during evaluation: PACU Anesthesia Type: MAC Level of consciousness: awake and alert Pain management: pain level controlled Vital Signs Assessment: post-procedure vital signs reviewed and stable Respiratory status: spontaneous breathing, nonlabored ventilation and respiratory function stable Cardiovascular status: blood pressure returned to baseline Postop Assessment: no apparent nausea or vomiting Anesthetic complications: no   No notable events documented.  Last Vitals:  Vitals:   06/24/23 1330 06/24/23 1340  BP: 118/69 125/62  Pulse: 88 80  Resp: 20 18  Temp:    SpO2: 98% 100%    Last Pain:  Vitals:   06/24/23 1340  TempSrc:   PainSc: 0-No pain                 Vertell Row

## 2023-06-24 NOTE — H&P (View-Only) (Signed)
 Progress Note   Subjective  Hospital day #4 Chief Complaint: Anemia in the setting of alcoholic hepatitis (question of cirrhosis) with reported GI bleed  Today, patient is found sitting on her bedside commode, explains that she was up all night passing watery stool.  Tells me she just had some more, when she stands up I see the toilet is full of just bright red bloody water .  She also has some residue on the towel she had been sitting on.  Denies any new complaints or concerns.   Objective   Vital signs in last 24 hours: Temp:  [97.4 F (36.3 C)-98.4 F (36.9 C)] 97.6 F (36.4 C) (02/06 0559) Pulse Rate:  [82-90] 89 (02/06 0559) Resp:  [14-20] 17 (02/06 0559) BP: (118-134)/(63-89) 130/75 (02/06 0559) SpO2:  [95 %-100 %] 95 % (02/06 0559) Last BM Date : 06/24/23 General: AA female in NAD Heart:  Regular rate and rhythm; no murmurs Lungs: Respirations even and unlabored, lungs CTA bilaterally Abdomen:  Soft, nontender and nondistended. Normal bowel sounds. Psych:  Cooperative. Normal mood and affect.  Intake/Output from previous day: 02/05 0701 - 02/06 0700 In: 250 [I.V.:250] Out: -   Lab Results: Recent Labs    06/22/23 0445 06/22/23 1236 06/23/23 0441 06/23/23 1333 06/24/23 0135  WBC 15.9*  --  11.7*  --  12.1*  HGB 6.9*   < > 7.5* 7.8* 8.9*  HCT 20.6*   < > 22.3* 23.5* 27.5*  PLT 195  --  184  --  166   < > = values in this interval not displayed.   BMET Recent Labs    06/22/23 0445 06/23/23 0441 06/24/23 0135  NA 129* 133* 132*  K 4.0 3.2* 2.9*  CL 102 105 103  CO2 18* 16* 16*  GLUCOSE 142* 112* 113*  BUN 29* 22* 20  CREATININE 1.93* 1.26* 1.39*  CALCIUM  7.6* 8.0* 8.5*      Latest Ref Rng & Units 06/24/2023    1:35 AM 06/23/2023    4:41 AM 06/22/2023    4:45 AM  Hepatic Function  Total Protein 6.5 - 8.1 g/dL 8.2  6.5  6.2   Albumin  3.5 - 5.0 g/dL 3.0  2.3  <8.4   AST 15 - 41 U/L 93  57  62   ALT 0 - 44 U/L 28  20  24    Alk Phosphatase 38 - 126  U/L 52  41  61   Total Bilirubin 0.0 - 1.2 mg/dL 4.9  4.3  3.9      PT/INR Recent Labs    06/23/23 0441 06/24/23 0135  LABPROT 25.2* 19.9*  INR 2.3* 1.7*    Studies/Results: US  ASCITES (ABDOMEN LIMITED) Result Date: 06/22/2023 CLINICAL DATA:  Ascites check. EXAM: LIMITED ABDOMEN ULTRASOUND FOR ASCITES TECHNIQUE: Limited ultrasound survey for ascites was performed in all four abdominal quadrants. COMPARISON:  None Available. FINDINGS: No abdominal free fluid is seen within any of the 4 abdominal quadrants. IMPRESSION: No evidence of ascites. Electronically Signed   By: Suzen Dials M.D.   On: 06/22/2023 17:44    Assessment / Plan:   Assessment: 1.  Acute GI bleed: Reported maroon/dark stool initially, hemoglobin 4.1--> 1 unit PRBCs--> 5.5--> 2 units PRBCs--> 7.9--> 10.8--> 6.9--> 1 unit PRBCs--> 8.5--> 7.5--> 7.8--> 8.9, bright red blood in the toilet today after bowel prep over the evening, EGD yesterday with no etiology; likely lower GI bleed 2.  Alcohol  abuse with question of cirrhosis and alcoholic hepatitis: Creatinine  slightly increased today, DF score in 06/22/2023 ewas 60-Prednisolone  was initiated on 06/23/2023, acute hepatitis panel negative 3.  Leukocytosis: Improving 4.  Cough 5. Hypokalemia  Plan: 1.  Patient had EGD yesterday with Dr. Avram.  Reviewed.  She is scheduled for colonoscopy today.  Seems to be running clear minus bright red blood. 2.  Ordered some potassium this morning, 40 mEq p.o. and 4 rounds of 10 mg IV per recommendations from Dr. Avram. 3.  Continue n.p.o. for now. 4.  Further recommendations to come after time procedure today.  Thank you for your kind consultation, we will continue to follow.   LOS: 4 days   Elizabeth Bush  06/24/2023, 9:35 AM

## 2023-06-24 NOTE — Op Note (Addendum)
 [facility] Patient Name: Elizabeth Bush Procedure Date: 06/24/2023 MRN: [id number] Attending MD: Lupita FORBES Commander , MD, [id number] Date of Birth: [birth year] CSN: [id number] Age: 53 Admit Type: Inpatient Procedure:                Colonoscopy Indications:              Hematochezia, Anemia Providers:                Lupita CHARLENA Commander, MD, Almarie Pizza, RN, Corene Southgate, Technician Referring MD:              Medicines:                Monitored Anesthesia Care Complications:            No immediate complications. Estimated Blood Loss:     Estimated blood loss was minimal. Procedure:                Pre-Anesthesia Assessment:                           - Prior to the procedure, a History and Physical                            was performed, and patient medications and                            allergies were reviewed. The patient's tolerance of                            previous anesthesia was also reviewed. The risks                            and benefits of the procedure and the sedation                            options and risks were discussed with the patient.                            All questions were answered, and informed consent                            was obtained. Prior Anticoagulants: The patient has                            taken no anticoagulant or antiplatelet agents. ASA                            Grade Assessment: III - A patient with severe                            systemic disease. After reviewing the risks and  benefits, the patient was deemed in satisfactory                            condition to undergo the procedure.                           After obtaining informed consent, the colonoscope                            was passed under direct vision. Throughout the                            procedure, the patient's blood pressure, pulse, and                            oxygen saturations were  monitored continuously. The                            CF-HQ190L ([id number]) Olympus colonoscope was                            introduced through the anus and advanced to the the                            terminal ileum, with identification of the                            appendiceal orifice and IC valve. The colonoscopy                            was performed without difficulty. The patient                            tolerated the procedure well. The quality of the                            bowel preparation was good. The terminal ileum,                            ileocecal valve, appendiceal orifice, and rectum                            were photographed. The bowel preparation used was                            MoviPrep  via split dose instruction. Scope In: 12:59:00 PM Scope Out: 1:11:15 PM Scope Withdrawal Time: 0 hours 9 minutes 38 seconds  Total Procedure Duration: 0 hours 12 minutes 15 seconds  Findings:      Hemorrhoids were found on perianal exam. Blood on finger      Bleeding internal hemorrhoids were found. The hemorrhoids were large.      Multiple diverticula were found in the sigmoid colon.      Petechiae were found in the ascending colon and in the cecum. This was  biopsied with a cold forceps for histology. Verification of patient       identification for the specimen was done. Estimated blood loss was       minimal.      The exam was otherwise without abnormality on direct and retroflexion       views. Impression:               - Hemorrhoids found on perianal exam. Friable and                            bleeding.                           - Bleeding internal hemorrhoids. - Could be an                            external component vs prolapsed internal                            hemorrhoids. In 2021 ? of rectal prolapse but I                            wonder if not hemorrhoidal as opposed to rectal.                           - Diverticulosis in the sigmoid  colon.                           - Petechia(e) in the ascending colon and in the                            cecum. Biopsied. Suspect traumatic and not a                            disease process. Though some contact friability no                            blood seen spontaneously and none distal exapt at                            anorectum where hemorrhoids were seen.                           - The examination was otherwise normal on direct                            and retroflexion views.                           - Personal history of colonic polyps. 4 diminutive                            adenomas 2021 - Dr. San Moderate Sedation:      Not Applicable - Patient had care per Anesthesia. Recommendation:           -  Return patient to hospital ward for ongoing care.                           - F/U pathology - I think the cecal and ascending                            colon changes are superficial petechial changes                            related to prep trauma as opposed to a disease                            process. Think anemia is multifactorial and                            hemorrhoids are source of bleeding. HC cream bid.                            She could be a candidate for more definitive                            treatment (banding, surgery) but not at present                            time w/ alcoholic hepatitis. She has seen Dr. Teresa                            in past 2021 and sent to [facility] - prior ?                            rectal prolapse + hemorrhoids.                           - Repeat colonoscopy in 5 years for polyp                            surveillance. Dr. San Update recalls Procedure Code(s):        --- Professional ---                           [phone], Colonoscopy, flexible; with biopsy, single                            or multiple Diagnosis Code(s):        --- Professional ---                           K64.8, Other hemorrhoids                            K63.89, Other specified diseases of intestine                           K92.1, Melena (includes Hematochezia)  D64.9, Anemia, unspecified                           K57.30, Diverticulosis of large intestine without                            perforation or abscess without bleeding CPT copyright 2022 [employer]. All rights reserved. The codes documented in this report are preliminary and upon coder review may  be revised to meet current compliance requirements. Lupita FORBES Commander, MD 06/24/2023 1:33:43 PM This report has been signed electronically. Number of Addenda: 0

## 2023-06-24 NOTE — Anesthesia Preprocedure Evaluation (Signed)
 Anesthesia Evaluation  Patient identified by MRN, date of birth, ID band Patient awake    Reviewed: Allergy & Precautions, NPO status , Patient's Chart, lab work & pertinent test results  History of Anesthesia Complications Negative for: history of anesthetic complications  Airway Mallampati: II  TM Distance: >3 FB Neck ROM: Full    Dental  (+) Missing,    Pulmonary Current Smoker and Patient abstained from smoking.   Pulmonary exam normal        Cardiovascular hypertension, Pt. on medications Normal cardiovascular exam     Neuro/Psych   Anxiety Depression    negative neurological ROS     GI/Hepatic ,GERD  Medicated,,(+) Cirrhosis         Endo/Other  negative endocrine ROS    Renal/GU Renal InsufficiencyRenal disease  negative genitourinary   Musculoskeletal  (+) Arthritis ,    Abdominal   Peds  Hematology  (+) Blood dyscrasia (Hgb 8.9), anemia   Anesthesia Other Findings Day of surgery medications reviewed with patient.  Reproductive/Obstetrics                             Anesthesia Physical Anesthesia Plan  ASA: 3  Anesthesia Plan: MAC   Post-op Pain Management: Minimal or no pain anticipated   Induction:   PONV Risk Score and Plan: Treatment may vary due to age or medical condition and Propofol  infusion  Airway Management Planned: Natural Airway and Nasal Cannula  Additional Equipment: None  Intra-op Plan:   Post-operative Plan:   Informed Consent: I have reviewed the patients History and Physical, chart, labs and discussed the procedure including the risks, benefits and alternatives for the proposed anesthesia with the patient or authorized representative who has indicated his/her understanding and acceptance.       Plan Discussed with: CRNA  Anesthesia Plan Comments:        Anesthesia Quick Evaluation

## 2023-06-24 NOTE — Transfer of Care (Signed)
 Immediate Anesthesia Transfer of Care Note  Patient: Elizabeth Bush  Procedure(s) Performed: COLONOSCOPY WITH PROPOFOL  BIOPSY  Patient Location: PACU  Anesthesia Type:MAC  Level of Consciousness: awake, alert , oriented, and patient cooperative  Airway & Oxygen Therapy: Patient Spontanous Breathing and Patient connected to face mask oxygen  Post-op Assessment: Report given to RN and Post -op Vital signs reviewed and stable  Post vital signs: Reviewed and stable  Last Vitals:  Vitals Value Taken Time  BP 96/55 06/24/23 1320  Temp    Pulse 85 06/24/23 1320  Resp 20 06/24/23 1320  SpO2 97 % 06/24/23 1320  Vitals shown include unfiled device data.  Last Pain:  Vitals:   06/24/23 1159  TempSrc: Temporal  PainSc: 0-No pain         Complications: No notable events documented.

## 2023-06-25 ENCOUNTER — Ambulatory Visit: Payer: Medicaid Other | Admitting: Internal Medicine

## 2023-06-25 DIAGNOSIS — K625 Hemorrhage of anus and rectum: Secondary | ICD-10-CM

## 2023-06-25 DIAGNOSIS — K7030 Alcoholic cirrhosis of liver without ascites: Secondary | ICD-10-CM | POA: Diagnosis not present

## 2023-06-25 DIAGNOSIS — D649 Anemia, unspecified: Secondary | ICD-10-CM | POA: Diagnosis not present

## 2023-06-25 DIAGNOSIS — K5730 Diverticulosis of large intestine without perforation or abscess without bleeding: Secondary | ICD-10-CM | POA: Diagnosis not present

## 2023-06-25 DIAGNOSIS — K7010 Alcoholic hepatitis without ascites: Secondary | ICD-10-CM

## 2023-06-25 DIAGNOSIS — D509 Iron deficiency anemia, unspecified: Secondary | ICD-10-CM

## 2023-06-25 DIAGNOSIS — K648 Other hemorrhoids: Secondary | ICD-10-CM | POA: Diagnosis not present

## 2023-06-25 DIAGNOSIS — E872 Acidosis, unspecified: Secondary | ICD-10-CM | POA: Diagnosis not present

## 2023-06-25 DIAGNOSIS — R7989 Other specified abnormal findings of blood chemistry: Secondary | ICD-10-CM | POA: Diagnosis not present

## 2023-06-25 DIAGNOSIS — K922 Gastrointestinal hemorrhage, unspecified: Secondary | ICD-10-CM | POA: Diagnosis not present

## 2023-06-25 DIAGNOSIS — K921 Melena: Secondary | ICD-10-CM | POA: Diagnosis not present

## 2023-06-25 DIAGNOSIS — D72829 Elevated white blood cell count, unspecified: Secondary | ICD-10-CM | POA: Diagnosis not present

## 2023-06-25 LAB — COMPREHENSIVE METABOLIC PANEL
ALT: 28 U/L (ref 0–44)
AST: 99 U/L — ABNORMAL HIGH (ref 15–41)
Albumin: 2.4 g/dL — ABNORMAL LOW (ref 3.5–5.0)
Alkaline Phosphatase: 47 U/L (ref 38–126)
Anion gap: 11 (ref 5–15)
BUN: 18 mg/dL (ref 6–20)
CO2: 14 mmol/L — ABNORMAL LOW (ref 22–32)
Calcium: 8.5 mg/dL — ABNORMAL LOW (ref 8.9–10.3)
Chloride: 110 mmol/L (ref 98–111)
Creatinine, Ser: 1.19 mg/dL — ABNORMAL HIGH (ref 0.44–1.00)
GFR, Estimated: 55 mL/min — ABNORMAL LOW (ref >60)
Glucose, Bld: 119 mg/dL — ABNORMAL HIGH (ref 70–99)
Potassium: 3.5 mmol/L (ref 3.5–5.1)
Sodium: 135 mmol/L (ref 135–145)
Total Bilirubin: 3.2 mg/dL — ABNORMAL HIGH (ref 0.0–1.2)
Total Protein: 7.4 g/dL (ref 6.5–8.1)

## 2023-06-25 LAB — SURGICAL PATHOLOGY

## 2023-06-25 LAB — CBC WITH DIFFERENTIAL/PLATELET
Abs Immature Granulocytes: 0.17 10*3/uL — ABNORMAL HIGH (ref 0.00–0.07)
Basophils Absolute: 0 10*3/uL (ref 0.0–0.1)
Basophils Relative: 0 %
Eosinophils Absolute: 0 10*3/uL (ref 0.0–0.5)
Eosinophils Relative: 0 %
HCT: 32.7 % — ABNORMAL LOW (ref 36.0–46.0)
Hemoglobin: 10.7 g/dL — ABNORMAL LOW (ref 12.0–15.0)
Immature Granulocytes: 2 %
Lymphocytes Relative: 16 %
Lymphs Abs: 1.4 10*3/uL (ref 0.7–4.0)
MCH: 29 pg (ref 26.0–34.0)
MCHC: 32.7 g/dL (ref 30.0–36.0)
MCV: 88.6 fL (ref 80.0–100.0)
Monocytes Absolute: 1 10*3/uL (ref 0.1–1.0)
Monocytes Relative: 11 %
Neutro Abs: 6.2 10*3/uL (ref 1.7–7.7)
Neutrophils Relative %: 71 %
Platelets: 129 10*3/uL — ABNORMAL LOW (ref 150–400)
RBC: 3.69 MIL/uL — ABNORMAL LOW (ref 3.87–5.11)
RDW: 21.7 % — ABNORMAL HIGH (ref 11.5–15.5)
WBC: 8.7 10*3/uL (ref 4.0–10.5)
nRBC: 0.2 % (ref 0.0–0.2)

## 2023-06-25 LAB — PROTIME-INR
INR: 2.1 — ABNORMAL HIGH (ref 0.8–1.2)
Prothrombin Time: 23.9 s — ABNORMAL HIGH (ref 11.4–15.2)

## 2023-06-25 LAB — HEMOGLOBIN AND HEMATOCRIT, BLOOD
HCT: 24.5 % — ABNORMAL LOW (ref 36.0–46.0)
Hemoglobin: 8.1 g/dL — ABNORMAL LOW (ref 12.0–15.0)

## 2023-06-25 MED ORDER — VITAMIN K1 10 MG/ML IJ SOLN
10.0000 mg | Freq: Once | INTRAMUSCULAR | Status: DC
  Filled 2023-06-25 (×2): qty 1

## 2023-06-25 MED ORDER — SODIUM BICARBONATE 8.4 % IV SOLN
50.0000 meq | Freq: Once | INTRAVENOUS | Status: AC
  Administered 2023-06-25: 50 meq via INTRAVENOUS
  Filled 2023-06-25: qty 50

## 2023-06-25 MED ORDER — SODIUM BICARBONATE 4.2 % IV SOLN
50.0000 meq | Freq: Once | INTRAVENOUS | Status: DC
  Filled 2023-06-25: qty 100

## 2023-06-25 NOTE — Plan of Care (Signed)

## 2023-06-25 NOTE — Progress Notes (Addendum)
   Patient Name: Elizabeth Bush Date of Encounter: 06/25/2023, 11:27 AM   Hospital day #5  Assessment and Plan  #1 alcoholic hepatitis day 3 prednisolone   #2 chronic bleeding hemorrhoids leading to iron  deficiency anemia (there were petechial-like changes in the proximal colon and cecum/ascending that I think were traumatic from the prep and not pathologic biopsies pending) - biopsies show minimally increased lymphocytes - non-specific and not something that would bleed  #3 AKI improved  #4 cough  --------------------------------------------------------------------------------------------------  Overall improved but still having bleeding from hemorrhoids.  Will continue supportive care.   She has not started the hydrocortisone  cream which she needs to start.  I am afraid the hemorrhoids are going to be difficult to treat and could require definitive therapy such as surgery which is contraindicated in the current setting.  I think she is clearing up and I am discontinuing lactulose .  INR is increased again, I will add vitamin K  1 more time.  This is certainly contributing to bleeding hemorrhoids.   Subjective  She is dripping blood from the anus after defecation and stool in the bedside commode shows brown stool with bright red blood in the water .  She reports that this is how things have gone for years regarding rectal bleeding.  She is still coughing   Objective  BP 121/70 (BP Location: Left Arm)   Pulse 85   Temp 97.7 F (36.5 C)   Resp 18   Ht 5' 7 (1.702 m)   Wt 102.8 kg   LMP  (LMP Unknown)   SpO2 100%   BMI 35.50 kg/m  She is alert and oriented x 3 there is no asterixis Lungs show few rhonchi but otherwise clear Normal heart sounds Abdomen is obese and nontender  EGD 06/23/2023 normal Colonoscopy 06/24/2023 with friable bleeding internal hemorrhoids, left-sided diverticulosis, and petechial changes in the cecum and ascending colon that I think were likely traumatic  from the prep and not pathologic biopsies pending  Recent Labs  Lab 06/23/23 0441 06/23/23 1333 06/24/23 0135 06/24/23 1425 06/25/23 0054  HGB 7.5*   < > 8.9* 8.6* 10.7*  HCT 22.3*   < > 27.5* 25.7* 32.7*  WBC 11.7*  --  12.1*  --  8.7  PLT 184  --  166  --  129*   < > = values in this interval not displayed.   Recent Labs  Lab 06/21/23 0123 06/21/23 1320 06/22/23 0445 06/23/23 0441 06/24/23 0135 06/25/23 0054  NA 130* 132* 129* 133* 132* 135  K 4.3 4.0 4.0 3.2* 2.9* 3.5  CL 104 103 102 105 103 110  CO2 13* 15* 18* 16* 16* 14*  GLUCOSE 116* 113* 142* 112* 113* 119*  BUN 30* 28* 29* 22* 20 18  CREATININE 1.69* 1.68* 1.93* 1.26* 1.39* 1.19*  CALCIUM  8.1* 8.1* 7.6* 8.0* 8.5* 8.5*  MG 1.4*  --   --   --  2.0  --    Recent Labs  Lab 06/21/23 1320 06/22/23 0445 06/23/23 0441 06/24/23 0135 06/25/23 0054  AST 79* 62* 57* 93* 99*  ALT 30 24 20 28 28   ALKPHOS 63 61 41 52 47  BILITOT 5.2* 3.9* 4.3* 4.9* 3.2*  PROT 7.4 6.2* 6.5 8.2* 7.4  ALBUMIN  1.9* <1.5* 2.3* 3.0* 2.4*  INR 2.0* 2.3* 2.3* 1.7* 2.1*        Elizabeth CHARLENA Commander, MD, [facility] See AMION on call - gastroenterology for best contact person 06/25/2023 11:27 AM

## 2023-06-25 NOTE — Progress Notes (Signed)
  Progress Note   Patient: Elizabeth Bush FMW:[id number] DOB: July 16, 1970 DOA: 06/20/2023     5 DOS: the patient was seen and examined on 06/25/2023   Brief hospital course: 53 year old female with history of left TKA, depression, hypertension, alcohol  use, presenting 06/19/22 to [facility] ED with increased weakness found to have hemoglobin 4.1 on presentation.  Assessment and Plan:  Acute on chronic blood loss anemia - Hemoglobin 4.1 on presentation.  Iron  studies suggest chronic iron  deficiency anemia as well.  Status post 3 units PRBCs.  Hemoglobin currently stabilizing.  Will continue to trend hemoglobin.  Transfuse if hemoglobin less than 7.  Likely acute GI bleed - GI following closely.  EGD largely unremarkable, no acute bleeding appreciated.  Colonoscopy revealing only friable bleeding hemorrhoids.  Will recheck CBC in a.m.  EtOH abuse with possible cirrhosis and possible alcoholic hepatitis - MELD score 60.  GI following closely.  Currently on prednisolone  therapy (day 3 of 28 days).  Acute hepatitis panel negative.  Abdominal ultrasound noting no evidence of ascites.  LFTs only minimally evaded.  Will recheck Lille score on day 7.  INR continues to be elevated.  Vitamin K  x 1 per GI.  Acute kidney injury concern for hepatorenal syndrome - Showing mild improvement after PRBCs and albumin  infusion.  Continues to show improvement.  Creatinine downtrending from presentation.  Will recheck BMP in AM.  Hypokalemia - Improved this morning.  Likely exacerbated by colonoscopy prep/GI wasting.  Continue potassium replenishment protocol.  Metabolic acidosis - Likely from diarrhea and lactulose  along with ketosis/being NPO.  Sodium bicarb x 1 IV.  Liquid diet initiated.  Could be exacerbated by underlying liver disease as well.  Will recheck BMP in AM.     Subjective: Patient resting comfortably this morning.  Had a lot of bowel movements secondary to bowel prep.  Denies any fever, chills, chest pain,  nausea, vomiting, abdominal pain.  Controlled for colonoscopy later this morning Physical Exam: Vitals:   06/24/23 1356 06/24/23 2100 06/25/23 0445 06/25/23 1249  BP: 122/73 116/76 121/70 114/76  Pulse: 78 87 85 79  Resp:  18 18 17   Temp: (!) 97.5 F (36.4 C) 97.9 F (36.6 C) 97.7 F (36.5 C) (!) 97.5 F (36.4 C)  TempSrc: Oral   Oral  SpO2: 100% 100% 100% 100%  Weight:      Height:       GENERAL:  Alert, pleasant, no acute distress  HEENT:  EOMI slight scleral icterus CARDIOVASCULAR:  RRR, no murmurs appreciated RESPIRATORY:  Clear to auscultation, no wheezing, rales, or rhonchi GASTROINTESTINAL:  Soft, nontender, nondistended EXTREMITIES:  No LE edema bilaterally NEURO:  No new focal deficits appreciated SKIN:  No rashes noted PSYCH:  Appropriate mood and affect   Data Reviewed:  Results are pending, will review when available.  Family Communication: None at bedside  Disposition: Status is: Inpatient Remains inpatient appropriate because: Ongoing acute GI bleed  Planned Discharge Destination: Home    Time spent: 31 minutes  Author: Carliss LELON Canales, DO 06/25/2023 2:30 PM  For on call review [email or web address].

## 2023-06-26 DIAGNOSIS — K922 Gastrointestinal hemorrhage, unspecified: Secondary | ICD-10-CM | POA: Diagnosis not present

## 2023-06-26 DIAGNOSIS — D649 Anemia, unspecified: Secondary | ICD-10-CM | POA: Diagnosis not present

## 2023-06-26 DIAGNOSIS — K7010 Alcoholic hepatitis without ascites: Secondary | ICD-10-CM | POA: Diagnosis not present

## 2023-06-26 DIAGNOSIS — K648 Other hemorrhoids: Secondary | ICD-10-CM | POA: Diagnosis not present

## 2023-06-26 DIAGNOSIS — D72829 Elevated white blood cell count, unspecified: Secondary | ICD-10-CM | POA: Diagnosis not present

## 2023-06-26 DIAGNOSIS — R7989 Other specified abnormal findings of blood chemistry: Secondary | ICD-10-CM | POA: Diagnosis not present

## 2023-06-26 DIAGNOSIS — K625 Hemorrhage of anus and rectum: Secondary | ICD-10-CM | POA: Diagnosis not present

## 2023-06-26 DIAGNOSIS — E872 Acidosis, unspecified: Secondary | ICD-10-CM | POA: Diagnosis not present

## 2023-06-26 DIAGNOSIS — K921 Melena: Secondary | ICD-10-CM | POA: Diagnosis not present

## 2023-06-26 DIAGNOSIS — K5730 Diverticulosis of large intestine without perforation or abscess without bleeding: Secondary | ICD-10-CM | POA: Diagnosis not present

## 2023-06-26 DIAGNOSIS — K7030 Alcoholic cirrhosis of liver without ascites: Secondary | ICD-10-CM | POA: Diagnosis not present

## 2023-06-26 DIAGNOSIS — D509 Iron deficiency anemia, unspecified: Secondary | ICD-10-CM | POA: Diagnosis not present

## 2023-06-26 LAB — HEMOGLOBIN AND HEMATOCRIT, BLOOD
HCT: 23.3 % — ABNORMAL LOW (ref 36.0–46.0)
Hemoglobin: 7.6 g/dL — ABNORMAL LOW (ref 12.0–15.0)

## 2023-06-26 LAB — PROTIME-INR
INR: 2.1 — ABNORMAL HIGH (ref 0.8–1.2)
Prothrombin Time: 23.4 s — ABNORMAL HIGH (ref 11.4–15.2)

## 2023-06-26 LAB — COMPREHENSIVE METABOLIC PANEL
ALT: 27 U/L (ref 0–44)
AST: 71 U/L — ABNORMAL HIGH (ref 15–41)
Albumin: 2.1 g/dL — ABNORMAL LOW (ref 3.5–5.0)
Alkaline Phosphatase: 48 U/L (ref 38–126)
Anion gap: 8 (ref 5–15)
BUN: 21 mg/dL — ABNORMAL HIGH (ref 6–20)
CO2: 18 mmol/L — ABNORMAL LOW (ref 22–32)
Calcium: 8.2 mg/dL — ABNORMAL LOW (ref 8.9–10.3)
Chloride: 106 mmol/L (ref 98–111)
Creatinine, Ser: 1.14 mg/dL — ABNORMAL HIGH (ref 0.44–1.00)
GFR, Estimated: 58 mL/min — ABNORMAL LOW (ref >60)
Glucose, Bld: 115 mg/dL — ABNORMAL HIGH (ref 70–99)
Potassium: 3.2 mmol/L — ABNORMAL LOW (ref 3.5–5.1)
Sodium: 132 mmol/L — ABNORMAL LOW (ref 135–145)
Total Bilirubin: 2.9 mg/dL — ABNORMAL HIGH (ref 0.0–1.2)
Total Protein: 6.6 g/dL (ref 6.5–8.1)

## 2023-06-26 LAB — CBC WITH DIFFERENTIAL/PLATELET
Abs Immature Granulocytes: 0.17 10*3/uL — ABNORMAL HIGH (ref 0.00–0.07)
Basophils Absolute: 0 10*3/uL (ref 0.0–0.1)
Basophils Relative: 0 %
Eosinophils Absolute: 0 10*3/uL (ref 0.0–0.5)
Eosinophils Relative: 0 %
HCT: 22.5 % — ABNORMAL LOW (ref 36.0–46.0)
Hemoglobin: 7.2 g/dL — ABNORMAL LOW (ref 12.0–15.0)
Immature Granulocytes: 1 %
Lymphocytes Relative: 19 %
Lymphs Abs: 2.5 10*3/uL (ref 0.7–4.0)
MCH: 29.1 pg (ref 26.0–34.0)
MCHC: 32 g/dL (ref 30.0–36.0)
MCV: 91.1 fL (ref 80.0–100.0)
Monocytes Absolute: 1.1 10*3/uL — ABNORMAL HIGH (ref 0.1–1.0)
Monocytes Relative: 9 %
Neutro Abs: 9 10*3/uL — ABNORMAL HIGH (ref 1.7–7.7)
Neutrophils Relative %: 71 %
Platelets: 175 10*3/uL (ref 150–400)
RBC: 2.47 MIL/uL — ABNORMAL LOW (ref 3.87–5.11)
RDW: 21.2 % — ABNORMAL HIGH (ref 11.5–15.5)
WBC: 12.7 10*3/uL — ABNORMAL HIGH (ref 4.0–10.5)
nRBC: 0 % (ref 0.0–0.2)

## 2023-06-26 LAB — RETICULOCYTES
Immature Retic Fract: 5.2 % (ref 2.3–15.9)
RBC.: 2.41 MIL/uL — ABNORMAL LOW (ref 3.87–5.11)
Retic Count, Absolute: 57.6 10*3/uL (ref 19.0–186.0)
Retic Ct Pct: 2.4 % (ref 0.4–3.1)

## 2023-06-26 LAB — MAGNESIUM: Magnesium: 1.4 mg/dL — ABNORMAL LOW (ref 1.7–2.4)

## 2023-06-26 MED ORDER — SODIUM BICARBONATE 650 MG PO TABS
650.0000 mg | ORAL_TABLET | Freq: Two times a day (BID) | ORAL | Status: DC
  Administered 2023-06-26 – 2023-06-27 (×3): 650 mg via ORAL
  Filled 2023-06-26 (×3): qty 1

## 2023-06-26 MED ORDER — POTASSIUM CHLORIDE 10 MEQ/100ML IV SOLN
10.0000 meq | INTRAVENOUS | Status: AC
  Administered 2023-06-26 (×2): 10 meq via INTRAVENOUS
  Filled 2023-06-26 (×2): qty 100

## 2023-06-26 MED ORDER — IRON SUCROSE 200 MG IVPB - SIMPLE MED
200.0000 mg | Status: DC
  Administered 2023-06-26: 200 mg via INTRAVENOUS
  Filled 2023-06-26: qty 110
  Filled 2023-06-26: qty 200

## 2023-06-26 MED ORDER — IPRATROPIUM-ALBUTEROL 0.5-2.5 (3) MG/3ML IN SOLN
3.0000 mL | Freq: Once | RESPIRATORY_TRACT | Status: AC
  Administered 2023-06-26: 3 mL via RESPIRATORY_TRACT
  Filled 2023-06-26: qty 3

## 2023-06-26 MED ORDER — MAGNESIUM SULFATE 2 GM/50ML IV SOLN
2.0000 g | Freq: Once | INTRAVENOUS | Status: AC
  Administered 2023-06-26: 2 g via INTRAVENOUS
  Filled 2023-06-26: qty 50

## 2023-06-26 MED ORDER — IPRATROPIUM-ALBUTEROL 0.5-2.5 (3) MG/3ML IN SOLN: 3.0000 mL | Freq: Four times a day (QID) | RESPIRATORY_TRACT | Status: DC | PRN

## 2023-06-26 NOTE — Progress Notes (Signed)
 Progress Note   Patient: Elizabeth Bush [id number] DOB: Aug 05, 1970 DOA: 06/20/2023     6 DOS: the patient was seen and examined on 06/26/2023   Brief hospital course: 53 year old female with history of left TKA, depression, hypertension, alcohol  use, presenting 06/19/22 to [facility] ED with increased weakness found to have hemoglobin 4.1 on presentation.  Assessment and Plan:  Acute on chronic blood loss anemia - Hemoglobin 4.1 on presentation.  Iron  studies suggest chronic iron  deficiency anemia as well.  Status post 3 units PRBCs.  Hemoglobin continues to show slow downtrend.  Hemoglobin 7.1 this morning.  Will recheck hemoglobin later this afternoon.  Likely transfuse if hemoglobin less than 7.  Reticulocyte count inappropriately normal.  Will also supplement with IV iron  x 2.  Acute lower GI bleed - GI following closely.  EGD largely unremarkable, no acute bleeding appreciated.  Colonoscopy revealing only friable bleeding hemorrhoids.  At this time no need for repeat endoscopy/procedures.  Work to correct hemoglobinopathies, which may prove difficult given patient's liver disease.  Vitamin K  on board.  Holding anticoagulants.  EtOH abuse with possible cirrhosis and possible alcoholic hepatitis - MELD score 60.  GI following closely.  Currently on prednisolone  therapy (day 4 of 28 days).  Acute hepatitis panel negative.  Abdominal ultrasound noting no evidence of ascites.  LFTs only minimally evaded.  Will recheck Lille score on day 7.  INR continues to be elevated.  Vitamin K  on board per GI.  Acute kidney injury concern for hepatorenal syndrome - Showing mild improvement after PRBCs and albumin  infusion.  Continues to show improvement.  Creatinine downtrending from presentation.  Will recheck BMP in AM.  Hypokalemia with hypomagnesemia - Slightly worse this morning.  Likely exacerbated by GI wasting.  Will order IV magnesium  2 g as well as IV potassium supplementation.  Metabolic acidosis -  Likely from diarrhea and lactulose  along with ketosis/being NPO.  Sodium bicarb x 1 IV.  Diet on board.  Could be exacerbated by underlying liver disease as well.  Showing mild improvement.  Will initiate on p.o. sodium bicarb 650 mg twice daily.  Will recheck BMP in AM.     Subjective: Patient resting comfortably this morning.  Continues to have loose bowel movements with bright red blood.  Denies any fever, chills, chest pain, nausea, vomiting, abdominal pain.  Patient motivated to be discharged however informed her about her decreasing hemoglobin and need for possible transfusion later today.  Physical Exam: Vitals:   06/25/23 1249 06/25/23 2026 06/26/23 0531 06/26/23 1200  BP: 114/76 (!) 137/92 123/72 119/73  Pulse: 79 85 81 78  Resp: 17 20 18 18   Temp: (!) 97.5 F (36.4 C) 98.1 F (36.7 C) 98.4 F (36.9 C) (!) 97.4 F (36.3 C)  TempSrc: Oral   Oral  SpO2: 100% 99% 95% 100%  Weight:      Height:       GENERAL:  Alert, pleasant, no acute distress  HEENT:  EOMI, slight scleral icterus CARDIOVASCULAR:  RRR, no murmurs appreciated RESPIRATORY:  Clear to auscultation, no wheezing, rales, or rhonchi GASTROINTESTINAL:  Soft, nontender, nondistended EXTREMITIES:  No LE edema bilaterally NEURO:  No new focal deficits appreciated SKIN:  No rashes noted PSYCH:  Appropriate mood and affect   Data Reviewed:  Results are pending, will review when available.  Family Communication: None at bedside  Disposition: Status is: Inpatient Remains inpatient appropriate because: Ongoing acute GI bleed  Planned Discharge Destination: Home    Time spent: 44  minutes  Author: Carliss LELON Canales, DO 06/26/2023 1:10 PM  For on call review [email or web address].

## 2023-06-26 NOTE — Plan of Care (Signed)

## 2023-06-26 NOTE — Progress Notes (Signed)
   Patient Name: Elizabeth Bush Date of Encounter: 06/26/2023, 9:39 AM   Hospital day #5  Assessment and Plan  #1 alcoholic hepatitis day 4 prednisolone  - INR still elevated (vit K 10 mg x 3), bili declining  #2 chronic bleeding hemorrhoids leading to iron  deficiency anemia (there were petechial-like changes in the proximal colon and cecum/ascending that I think were traumatic from the prep and not pathologic biopsies pending) - biopsies show minimally increased lymphocytes - non-specific and not something that would bleed -  #3 AKI improved  #4 cough - seems less  --------------------------------------------------------------------------------------------------  Overall improved but still having bleeding from hemorrhoids, Hgb drifting down - may need more blood - continue HC cream. I do not think much more can  be done now, abnl coags not helping  Continue prednisolone   Elizabeth CHARLENA Commander, MD, [facility] [place] [facility] See TRACEY on call - gastroenterology for best contact person 06/26/2023 9:46 AM    Subjective  Still bleeding from hemorrhoids, HC cream started Not coughing today at interview   Objective  BP 123/72 (BP Location: Right Arm)   Pulse 81   Temp 98.4 F (36.9 C)   Resp 18   Ht 5' 7 (1.702 m)   Wt 102.8 kg   LMP  (LMP Unknown)   SpO2 95%   BMI 35.50 kg/m  Lungs  clear Normal heart sounds Abdomen is obese and nontender Diaper is blood stained  EGD 06/23/2023 normal Colonoscopy 06/24/2023 with friable bleeding internal hemorrhoids, left-sided diverticulosis, and petechial changes in the cecum and ascending colon that I think were likely traumatic from the prep - bxs increased lymphocytes no vascular abnormalities  Recent Labs  Lab 06/24/23 0135 06/24/23 1425 06/25/23 0054 06/25/23 1329 06/26/23 0236  HGB 8.9*   < > 10.7* 8.1* 7.2*  HCT 27.5*   < > 32.7* 24.5* 22.5*  WBC 12.1*  --  8.7  --  12.7*  PLT 166  --  129*  --  175   < > = values in this  interval not displayed.   Recent Labs  Lab 06/21/23 0123 06/21/23 1320 06/22/23 0445 06/23/23 0441 06/24/23 0135 06/25/23 0054 06/26/23 0236  NA 130*   < > 129* 133* 132* 135 132*  K 4.3   < > 4.0 3.2* 2.9* 3.5 3.2*  CL 104   < > 102 105 103 110 106  CO2 13*   < > 18* 16* 16* 14* 18*  GLUCOSE 116*   < > 142* 112* 113* 119* 115*  BUN 30*   < > 29* 22* 20 18 21*  CREATININE 1.69*   < > 1.93* 1.26* 1.39* 1.19* 1.14*  CALCIUM  8.1*   < > 7.6* 8.0* 8.5* 8.5* 8.2*  MG 1.4*  --   --   --  2.0  --  1.4*   < > = values in this interval not displayed.   Recent Labs  Lab 06/22/23 0445 06/23/23 0441 06/24/23 0135 06/25/23 0054 06/26/23 0236  AST 62* 57* 93* 99* 71*  ALT 24 20 28 28 27   ALKPHOS 61 41 52 47 48  BILITOT 3.9* 4.3* 4.9* 3.2* 2.9*  PROT 6.2* 6.5 8.2* 7.4 6.6  ALBUMIN  <1.5* 2.3* 3.0* 2.4* 2.1*  INR 2.3* 2.3* 1.7* 2.1* 2.1*        Elizabeth CHARLENA Commander, MD, [facility] See AMION on call - gastroenterology for best contact person 06/26/2023 9:39 AM

## 2023-06-27 ENCOUNTER — Other Ambulatory Visit: Payer: Self-pay | Admitting: Internal Medicine

## 2023-06-27 DIAGNOSIS — D72829 Elevated white blood cell count, unspecified: Secondary | ICD-10-CM | POA: Diagnosis not present

## 2023-06-27 DIAGNOSIS — E876 Hypokalemia: Secondary | ICD-10-CM | POA: Diagnosis not present

## 2023-06-27 DIAGNOSIS — D509 Iron deficiency anemia, unspecified: Secondary | ICD-10-CM | POA: Diagnosis not present

## 2023-06-27 DIAGNOSIS — K5730 Diverticulosis of large intestine without perforation or abscess without bleeding: Secondary | ICD-10-CM | POA: Diagnosis not present

## 2023-06-27 DIAGNOSIS — D649 Anemia, unspecified: Secondary | ICD-10-CM | POA: Diagnosis not present

## 2023-06-27 DIAGNOSIS — K625 Hemorrhage of anus and rectum: Secondary | ICD-10-CM | POA: Diagnosis not present

## 2023-06-27 DIAGNOSIS — K922 Gastrointestinal hemorrhage, unspecified: Secondary | ICD-10-CM | POA: Diagnosis not present

## 2023-06-27 DIAGNOSIS — K921 Melena: Secondary | ICD-10-CM | POA: Diagnosis not present

## 2023-06-27 DIAGNOSIS — R7989 Other specified abnormal findings of blood chemistry: Secondary | ICD-10-CM | POA: Diagnosis not present

## 2023-06-27 DIAGNOSIS — K7030 Alcoholic cirrhosis of liver without ascites: Secondary | ICD-10-CM | POA: Diagnosis not present

## 2023-06-27 DIAGNOSIS — E872 Acidosis, unspecified: Secondary | ICD-10-CM | POA: Diagnosis not present

## 2023-06-27 DIAGNOSIS — K648 Other hemorrhoids: Secondary | ICD-10-CM | POA: Diagnosis not present

## 2023-06-27 DIAGNOSIS — K7010 Alcoholic hepatitis without ascites: Secondary | ICD-10-CM

## 2023-06-27 LAB — CBC WITH DIFFERENTIAL/PLATELET
Abs Immature Granulocytes: 0.24 10*3/uL — ABNORMAL HIGH (ref 0.00–0.07)
Basophils Absolute: 0 10*3/uL (ref 0.0–0.1)
Basophils Relative: 0 %
Eosinophils Absolute: 0 10*3/uL (ref 0.0–0.5)
Eosinophils Relative: 0 %
HCT: 23.2 % — ABNORMAL LOW (ref 36.0–46.0)
Hemoglobin: 7.5 g/dL — ABNORMAL LOW (ref 12.0–15.0)
Immature Granulocytes: 2 %
Lymphocytes Relative: 16 %
Lymphs Abs: 2.2 10*3/uL (ref 0.7–4.0)
MCH: 30 pg (ref 26.0–34.0)
MCHC: 32.3 g/dL (ref 30.0–36.0)
MCV: 92.8 fL (ref 80.0–100.0)
Monocytes Absolute: 1.1 10*3/uL — ABNORMAL HIGH (ref 0.1–1.0)
Monocytes Relative: 8 %
Neutro Abs: 10.5 10*3/uL — ABNORMAL HIGH (ref 1.7–7.7)
Neutrophils Relative %: 74 %
Platelets: 179 10*3/uL (ref 150–400)
RBC: 2.5 MIL/uL — ABNORMAL LOW (ref 3.87–5.11)
RDW: 21.6 % — ABNORMAL HIGH (ref 11.5–15.5)
WBC: 14.1 10*3/uL — ABNORMAL HIGH (ref 4.0–10.5)
nRBC: 0.1 % (ref 0.0–0.2)

## 2023-06-27 LAB — COMPREHENSIVE METABOLIC PANEL
ALT: 31 U/L (ref 0–44)
AST: 78 U/L — ABNORMAL HIGH (ref 15–41)
Albumin: 2.1 g/dL — ABNORMAL LOW (ref 3.5–5.0)
Alkaline Phosphatase: 43 U/L (ref 38–126)
Anion gap: 9 (ref 5–15)
BUN: 23 mg/dL — ABNORMAL HIGH (ref 6–20)
CO2: 18 mmol/L — ABNORMAL LOW (ref 22–32)
Calcium: 8.6 mg/dL — ABNORMAL LOW (ref 8.9–10.3)
Chloride: 106 mmol/L (ref 98–111)
Creatinine, Ser: 1.06 mg/dL — ABNORMAL HIGH (ref 0.44–1.00)
GFR, Estimated: >60 mL/min (ref >60)
Glucose, Bld: 98 mg/dL (ref 70–99)
Potassium: 3.2 mmol/L — ABNORMAL LOW (ref 3.5–5.1)
Sodium: 133 mmol/L — ABNORMAL LOW (ref 135–145)
Total Bilirubin: 3.4 mg/dL — ABNORMAL HIGH (ref 0.0–1.2)
Total Protein: 6.6 g/dL (ref 6.5–8.1)

## 2023-06-27 LAB — PROTIME-INR
INR: 1.9 — ABNORMAL HIGH (ref 0.8–1.2)
Prothrombin Time: 21.7 s — ABNORMAL HIGH (ref 11.4–15.2)

## 2023-06-27 MED ORDER — FERROUS SULFATE 325 (65 FE) MG PO TABS: 325.0000 mg | ORAL_TABLET | ORAL | 0 refills | Status: DC

## 2023-06-27 MED ORDER — PREDNISOLONE 5 MG PO TABS: 40.0000 mg | ORAL_TABLET | Freq: Every day | ORAL | 0 refills | Status: AC

## 2023-06-27 MED ORDER — POTASSIUM CHLORIDE CRYS ER 20 MEQ PO TBCR
40.0000 meq | EXTENDED_RELEASE_TABLET | Freq: Once | ORAL | Status: AC
Start: 2023-06-27 — End: 2023-06-27
  Administered 2023-06-27: 40 meq via ORAL
  Filled 2023-06-27: qty 2

## 2023-06-27 MED ORDER — VITAMIN B-1 100 MG PO TABS: 100.0000 mg | ORAL_TABLET | Freq: Every day | ORAL | 0 refills | Status: DC

## 2023-06-27 MED ORDER — HYDROCORTISONE (PERIANAL) 2.5 % EX CREA: TOPICAL_CREAM | Freq: Two times a day (BID) | CUTANEOUS | 0 refills | Status: DC

## 2023-06-27 NOTE — Plan of Care (Signed)

## 2023-06-27 NOTE — Discharge Summary (Signed)
 Physician Discharge Summary   Patient: Elizabeth Bush MRN: [id number] DOB: 1970/08/31  Admit date:     06/20/2023  Discharge date: 06/27/23  Discharge Physician: Carliss LELON Canales   PCP: Vicci Barnie NOVAK, MD   Recommendations at discharge:   At this time patient will be discharged home.  If you experience any symptoms such as fever, vomiting, shortness of breath, chest pain, abdominal pain, profuse bleeding, or other concerning symptoms, please call your primary care provider or go to the emergency department immediately.  Discharge Diagnoses: Principal Problem:   GI bleed Active Problems:   Anemia   Alcoholic hepatitis without ascites   Severe anemia   Hematochezia   Bleeding internal hemorrhoids  Resolved Problems:   * No resolved hospital problems. *  Hospital Course: 53 year old female with history of left TKA, depression, hypertension, alcohol  use, presenting 06/19/22 to [facility] ED with increased weakness found to have hemoglobin 4.1 on presentation.   Assessment and Plan:  Acute on chronic blood loss anemia - Hemoglobin 4.1 on presentation.  Iron  studies suggest chronic iron  deficiency anemia as well.  Status post 3 units PRBCs.  Hemoglobin initially downtrending however appears to be stable.  Uptrending since yesterday.  IV iron  received.  Will transition patient to p.o. iron  supplementation take every other day.    Acute lower GI bleed - GI following closely.  EGD largely unremarkable, no acute bleeding appreciated.  Colonoscopy revealing only friable bleeding hemorrhoids.  At this time no need for repeat endoscopy/procedures.  Medicate given due to patient's underlying liver disease.  Rectal bleeding appears to be slowing.  Recommending continued hydrocortisone  cream.  Patient will need to follow-up with primary care provider and possibly GI in the outpatient setting should continue to be a source of lower GI bleed.    EtOH abuse with possible cirrhosis and possible alcoholic  hepatitis - MELD score 60.  GI following closely.  Currently on prednisolone  therapy (day 4 of 28 days).  Acute hepatitis panel negative.  Abdominal ultrasound noting no evidence of ascites.  LFTs only minimally evaded.  Will recheck Lille score on day 7.  INR continues to be elevated.  Vitamin K  on board per GI.  Would benefit from continued outpatient follow-up with specialist.    Acute kidney injury concern for hepatorenal syndrome - Showing mild improvement after PRBCs and albumin  infusion.  Continues to show improvement.  Creatinine downtrending from presentation.     Hypokalemia with hypomagnesemia - Slightly worse this morning.  Likely exacerbated by GI wasting.  Showed improvement after supplementation.  Metabolic acidosis - Likely from diarrhea and lactulose  along with ketosis/being NPO.  Sodium bicarb x 1 IV.  Diet on board.  Could be exacerbated by underlying liver disease as well.  Showing mild improvement.  Improved after p.o. sodium bicarb 650 mg twice daily.  No Need to continue upon discharge.      Consultants: Gastroenterology Procedures performed: Esophagogastroduodenoscopy, colonoscopy Disposition: Home Diet recommendation:  Discharge Diet Orders (From admission, onward)     Start     Ordered   06/27/23 0000  Diet - low sodium heart healthy        06/27/23 0957           Regular diet DISCHARGE MEDICATION: Allergies as of 06/27/2023       Reactions   Dilaudid  [hydromorphone ] Anxiety, Other (See Comments)   Patient gets paranoid and has temporary delirium    Lisinopril Swelling   Swelling of mouth/lips  Medication List     STOP taking these medications    acetaminophen -codeine  300-30 MG tablet Commonly known as: TYLENOL  #3   celecoxib  200 MG capsule Commonly known as: CELEBREX    nystatin  powder Commonly known as: MYCOSTATIN /NYSTOP    traMADol  50 MG tablet Commonly known as: ULTRAM    triamcinolone  cream 0.1 % Commonly known as: KENALOG         TAKE these medications    amLODipine  10 MG tablet Commonly known as: NORVASC  Take 1 tablet (10 mg total) by mouth daily. For high blood pressure   cyclobenzaprine  5 MG tablet Commonly known as: FLEXERIL  Take 1 tablet (5 mg total) by mouth at bedtime. For muscle pain   DULoxetine  30 MG capsule Commonly known as: CYMBALTA  Take 1 capsule (30 mg total) by mouth every morning. Helps with depression and chronic pain   ferrous sulfate  325 (65 FE) MG tablet Take 1 tablet (325 mg total) by mouth every other day. What changed: when to take this   hydrocortisone  2.5 % rectal cream Commonly known as: ANUSOL -HC Place rectally 2 (two) times daily. What changed: how much to take   Misc. Devices Misc Rollator Walker DX M17.0   omeprazole  20 MG capsule Commonly known as: PRILOSEC TAKE ONE CAPSULE BY MOUTH ONCE DAILY   prednisoLONE  5 MG Tabs tablet Take 8 tablets (40 mg total) by mouth daily for 23 days. Start taking on: June 28, 2023   thiamine  100 MG tablet Commonly known as: Vitamin B-1 Take 1 tablet (100 mg total) by mouth daily. Start taking on: June 28, 2023        Discharge Exam: Fredricka Weights   06/20/23 1448 06/21/23 0103 06/24/23 1159  Weight: 108.9 kg 102.8 kg 102.8 kg   GENERAL:  Alert, pleasant, no acute distress  HEENT:  EOMI, slight scleral icterus CARDIOVASCULAR:  RRR, no murmurs appreciated RESPIRATORY:  Clear to auscultation, no wheezing, rales, or rhonchi GASTROINTESTINAL:  Soft, nontender, nondistended EXTREMITIES:  No LE edema bilaterally NEURO:  No new focal deficits appreciated SKIN:  No rashes noted PSYCH:  Appropriate mood and affect   Condition at discharge: improving  The results of significant diagnostics from this hospitalization (including imaging, microbiology, ancillary and laboratory) are listed below for reference.   Imaging Studies: US  ASCITES (ABDOMEN LIMITED) Result Date: 06/22/2023 CLINICAL DATA:  Ascites check. EXAM:  LIMITED ABDOMEN ULTRASOUND FOR ASCITES TECHNIQUE: Limited ultrasound survey for ascites was performed in all four abdominal quadrants. COMPARISON:  None Available. FINDINGS: No abdominal free fluid is seen within any of the 4 abdominal quadrants. IMPRESSION: No evidence of ascites. Electronically Signed   By: Suzen Dials M.D.   On: 06/22/2023 17:44   CT ANGIO ABDOMEN PELVIS  W & WO CONTRAST Result Date: 06/20/2023 CLINICAL DATA:  Lower GI bleed. EXAM: CTA ABDOMEN AND PELVIS WITHOUT AND WITH CONTRAST TECHNIQUE: Multidetector CT imaging of the abdomen and pelvis was performed using the standard protocol during bolus administration of intravenous contrast. Multiplanar reconstructed images and MIPs were obtained and reviewed to evaluate the vascular anatomy. RADIATION DOSE REDUCTION: This exam was performed according to the departmental dose-optimization program which includes automated exposure control, adjustment of the mA and/or kV according to patient size and/or use of iterative reconstruction technique. CONTRAST:  OMNIPAQUE  IOHEXOL  350 MG/ML SOLN COMPARISON:  07/23/2020. FINDINGS: VASCULAR Aorta: Mildly patent.  No aneurysm. Celiac: Widely patent. SMA: Widely patent. Renals: Widely patent. IMA: Widely patent. Inflow: Widely patent. Proximal Outflow: Widely patent. Veins: Widely patent.  Circumaortic left renal vein.  Review of the MIP images confirms the above findings. NON-VASCULAR Lower chest: No acute findings. Heart size normal. Heart is at the upper limits of normal in size to mildly enlarged. No pericardial or pleural effusion. Distal esophagus is grossly unremarkable. Hepatobiliary: Liver is decreased in attenuation diffusely and the margin is irregular. Stones in the gallbladder. No biliary ductal dilatation. Pancreas: Negative. Spleen: Negative. Adrenals/Urinary Tract: Adrenal glands are unremarkable. Low-attenuation lesion in the right kidney. Right renal cyst. Minimally hyperdense left  renal cyst. No specific follow-up necessary. No urinary stones. Ureters are decompressed. Bladder is grossly unremarkable. Stomach/Bowel: Stomach and small bowel are unremarkable. Ileocecal junction is in the high right abdomen, anterior to the liver. Appendix and colon are otherwise unremarkable. Lymphatic: No pathologically enlarged lymph nodes. Reproductive: Fibroid uterus.  No adnexal mass. Other: No free fluid.  Mesenteries and peritoneum are unremarkable. Musculoskeletal: Degenerative changes in the spine. Old left acetabular fracture. IMPRESSION: VASCULAR None. NON-VASCULAR 1. Cirrhosis. 2. Cholelithiasis. 3. Fibroid uterus. Electronically Signed   By: Newell Eke M.D.   On: 06/20/2023 19:30   MM 3D SCREENING MAMMOGRAM BILATERAL BREAST Result Date: 06/03/2023 CLINICAL DATA:  Screening. EXAM: DIGITAL SCREENING BILATERAL MAMMOGRAM WITH TOMOSYNTHESIS AND CAD TECHNIQUE: Bilateral screening digital craniocaudal and mediolateral oblique mammograms were obtained. Bilateral screening digital breast tomosynthesis was performed. The images were evaluated with computer-aided detection. COMPARISON:  Previous exam(s). ACR Breast Density Category b: There are scattered areas of fibroglandular density. FINDINGS: In the right breast, a possible asymmetry warrants further evaluation. In the left breast, no findings suspicious for malignancy. IMPRESSION: Further evaluation is suggested for possible asymmetry in the right breast. RECOMMENDATION: Diagnostic mammogram and possibly ultrasound of the right breast. (Code:[id number]) The patient will be contacted regarding the findings, and additional imaging will be scheduled. BI-RADS CATEGORY  0: Incomplete: Need additional imaging evaluation. Electronically Signed   By: Toribio Agreste M.D.   On: 06/03/2023 08:40    Microbiology: Results for orders placed or performed in visit on 01/15/21  SARS Coronavirus 2 (TAT 6-24 hrs)     Status: None   Collection Time: 01/15/21  12:00 AM  Result Value Ref Range Status   SARS Coronavirus 2 RESULT: NEGATIVE  Final    Comment: RESULT: NEGATIVESARS-CoV-2 INTERPRETATION:A NEGATIVE  test result means that SARS-CoV-2 RNA was not present in the specimen above the limit of detection of this test. This does not preclude a possible SARS-CoV-2 infection and should not be used as the  sole basis for patient management decisions. Negative results must be combined with clinical observations, patient history, and epidemiological information. Optimum specimen types and timing for peak viral levels during infections caused by SARS-CoV-2  have not been determined. Collection of multiple specimens or types of specimens may be necessary to detect virus. Improper specimen collection and handling, sequence variability under primers/probes, or organism present below the limit of detection may  lead to false negative results. Positive and negative predictive values of testing are highly dependent on prevalence. False negative test results are more likely when prevalence of disease is high.The expected result is NEGATIVE.Fact S heet for  Healthcare Providers: Collegecustoms.gl Sheet for Patients: Https://poole-freeman.org/ Reference Range - Negative     Labs: CBC: Recent Labs  Lab 06/23/23 0441 06/23/23 1333 06/24/23 0135 06/24/23 1425 06/25/23 0054 06/25/23 1329 06/26/23 0236 06/26/23 1357 06/27/23 0107  WBC 11.7*  --  12.1*  --  8.7  --  12.7*  --  14.1*  NEUTROABS 7.0  --  7.9*  --  6.2  --  9.0*  --  10.5*  HGB 7.5*   < > 8.9*   < > 10.7* 8.1* 7.2* 7.6* 7.5*  HCT 22.3*   < > 27.5*   < > 32.7* 24.5* 22.5* 23.3* 23.2*  MCV 88.8  --  90.8  --  88.6  --  91.1  --  92.8  PLT 184  --  166  --  129*  --  175  --  179   < > = values in this interval not displayed.   Basic Metabolic Panel: Recent Labs  Lab 06/21/23 0123 06/21/23 1320 06/23/23 0441 06/24/23 0135 06/25/23 0054  06/26/23 0236 06/27/23 0853  NA 130*   < > 133* 132* 135 132* 133*  K 4.3   < > 3.2* 2.9* 3.5 3.2* 3.2*  CL 104   < > 105 103 110 106 106  CO2 13*   < > 16* 16* 14* 18* 18*  GLUCOSE 116*   < > 112* 113* 119* 115* 98  BUN 30*   < > 22* 20 18 21* 23*  CREATININE 1.69*   < > 1.26* 1.39* 1.19* 1.14* 1.06*  CALCIUM  8.1*   < > 8.0* 8.5* 8.5* 8.2* 8.6*  MG 1.4*  --   --  2.0  --  1.4*  --    < > = values in this interval not displayed.   Liver Function Tests: Recent Labs  Lab 06/23/23 0441 06/24/23 0135 06/25/23 0054 06/26/23 0236 06/27/23 0853  AST 57* 93* 99* 71* 78*  ALT 20 28 28 27 31   ALKPHOS 41 52 47 48 43  BILITOT 4.3* 4.9* 3.2* 2.9* 3.4*  PROT 6.5 8.2* 7.4 6.6 6.6  ALBUMIN  2.3* 3.0* 2.4* 2.1* 2.1*   CBG: No results for input(s): GLUCAP in the last 168 hours.  Discharge time spent: less than 30 minutes.  Signed: Carliss LELON Canales, DO [facility] 06/27/2023

## 2023-06-27 NOTE — Progress Notes (Addendum)
   Patient Name: DOSHIE MAGGI Date of Encounter: 06/27/2023, 11:08 AM   Hospital day #5  Assessment and Plan  #1 alcoholic hepatitis day 5 prednisolone  -   #2 chronic bleeding hemorrhoids leading to iron  deficiency anemia (there were petechial-like changes in the proximal colon and cecum/ascending that I think were traumatic from the prep and not pathologic biopsies pending) - biopsies show minimally increased lymphocytes - non-specific and not something that would bleed -  #3 AKI improved  # 4 hypokalemia    --------------------------------------------------------------------------------------------------  Overall improved and for dc today. Hgb stable, s/p parenteral iron  - continue HC cream.  I do not think much more can  be done now, abnl coags not helping  Continue prednisolone   I have ordered labs at our office 2/12 No alcohol  - discussed - she plans to stop  40 mEq Kdur x 1    Lupita CHARLENA Commander, MD, [facility] See TRACEY on call - gastroenterology for best contact person 06/27/2023 11:08 AM    Subjective  Less bleeding from hemorrhoids Going home Says she will quit EtOH   Objective  BP (!) 153/81 (BP Location: Right Arm)   Pulse 77   Temp 97.7 F (36.5 C)   Resp 15   Ht 5' 7 (1.702 m)   Wt 102.8 kg   LMP  (LMP Unknown)   SpO2 98%   BMI 35.50 kg/m    EGD 06/23/2023 normal Colonoscopy 06/24/2023 with friable bleeding internal hemorrhoids, left-sided diverticulosis, and petechial changes in the cecum and ascending colon that I think were likely traumatic from the prep - bxs increased lymphocytes no vascular abnormalities  Recent Labs  Lab 06/25/23 0054 06/25/23 1329 06/26/23 0236 06/26/23 1357 06/27/23 0107  HGB 10.7*   < > 7.2* 7.6* 7.5*  HCT 32.7*   < > 22.5* 23.3* 23.2*  WBC 8.7  --  12.7*  --  14.1*  PLT 129*  --  175  --  179   < > = values in this interval not displayed.   Recent Labs  Lab 06/21/23 0123 06/21/23 1320  06/23/23 0441 06/24/23 0135 06/25/23 0054 06/26/23 0236 06/27/23 0853  NA 130*   < > 133* 132* 135 132* 133*  K 4.3   < > 3.2* 2.9* 3.5 3.2* 3.2*  CL 104   < > 105 103 110 106 106  CO2 13*   < > 16* 16* 14* 18* 18*  GLUCOSE 116*   < > 112* 113* 119* 115* 98  BUN 30*   < > 22* 20 18 21* 23*  CREATININE 1.69*   < > 1.26* 1.39* 1.19* 1.14* 1.06*  CALCIUM  8.1*   < > 8.0* 8.5* 8.5* 8.2* 8.6*  MG 1.4*  --   --  2.0  --  1.4*  --    < > = values in this interval not displayed.   Recent Labs  Lab 06/23/23 0441 06/24/23 0135 06/25/23 0054 06/26/23 0236 06/27/23 0853  AST 57* 93* 99* 71* 78*  ALT 20 28 28 27 31   ALKPHOS 41 52 47 48 43  BILITOT 4.3* 4.9* 3.2* 2.9* 3.4*  PROT 6.5 8.2* 7.4 6.6 6.6  ALBUMIN  2.3* 3.0* 2.4* 2.1* 2.1*  INR 2.3* 1.7* 2.1* 2.1* 1.9*        Lupita CHARLENA Commander, MD, [facility] See AMION on call - gastroenterology for best contact person 06/27/2023 11:08 AM

## 2023-06-27 NOTE — Plan of Care (Signed)
 Daughter voiced understanding of discharge instructions. Problem: Education: Goal: Knowledge of General Education information will improve Description: Including pain rating scale, medication(s)/side effects and non-pharmacologic comfort measures 06/27/2023 1317 by Conard Jon HERO, RN Outcome: Adequate for Discharge 06/27/2023 1224 by Conard Jon HERO, RN Outcome: Progressing   Problem: Health Behavior/Discharge Planning: Goal: Ability to manage health-related needs will improve 06/27/2023 1317 by Conard Jon HERO, RN Outcome: Adequate for Discharge 06/27/2023 1224 by Conard Jon HERO, RN Outcome: Progressing   Problem: Clinical Measurements: Goal: Ability to maintain clinical measurements within normal limits will improve 06/27/2023 1317 by Conard Jon HERO, RN Outcome: Adequate for Discharge 06/27/2023 1224 by Conard Jon HERO, RN Outcome: Progressing Goal: Will remain free from infection 06/27/2023 1317 by Conard Jon HERO, RN Outcome: Adequate for Discharge 06/27/2023 1224 by Conard Jon HERO, RN Outcome: Progressing Goal: Diagnostic test results will improve 06/27/2023 1317 by Conard Jon HERO, RN Outcome: Adequate for Discharge 06/27/2023 1224 by Conard Jon HERO, RN Outcome: Progressing Goal: Respiratory complications will improve 06/27/2023 1317 by Conard Jon HERO, RN Outcome: Adequate for Discharge 06/27/2023 1224 by Conard Jon HERO, RN Outcome: Progressing Goal: Cardiovascular complication will be avoided 06/27/2023 1317 by Conard Jon HERO, RN Outcome: Adequate for Discharge 06/27/2023 1224 by Conard Jon HERO, RN Outcome: Progressing   Problem: Activity: Goal: Risk for activity intolerance will decrease 06/27/2023 1317 by Conard Jon HERO, RN Outcome: Adequate for Discharge 06/27/2023 1224 by Conard Jon HERO, RN Outcome: Progressing   Problem: Nutrition: Goal: Adequate nutrition will be maintained 06/27/2023 1317 by Conard Jon HERO, RN Outcome: Adequate for  Discharge 06/27/2023 1224 by Conard Jon HERO, RN Outcome: Progressing   Problem: Coping: Goal: Level of anxiety will decrease 06/27/2023 1317 by Conard Jon HERO, RN Outcome: Adequate for Discharge 06/27/2023 1224 by Conard Jon HERO, RN Outcome: Progressing   Problem: Elimination: Goal: Will not experience complications related to bowel motility 06/27/2023 1317 by Conard Jon HERO, RN Outcome: Adequate for Discharge 06/27/2023 1224 by Conard Jon HERO, RN Outcome: Progressing Goal: Will not experience complications related to urinary retention 06/27/2023 1317 by Conard Jon HERO, RN Outcome: Adequate for Discharge 06/27/2023 1224 by Conard Jon HERO, RN Outcome: Progressing   Problem: Pain Managment: Goal: General experience of comfort will improve and/or be controlled 06/27/2023 1317 by Conard Jon HERO, RN Outcome: Adequate for Discharge 06/27/2023 1224 by Conard Jon HERO, RN Outcome: Progressing   Problem: Safety: Goal: Ability to remain free from injury will improve 06/27/2023 1317 by Conard Jon HERO, RN Outcome: Adequate for Discharge 06/27/2023 1224 by Conard Jon HERO, RN Outcome: Progressing   Problem: Skin Integrity: Goal: Risk for impaired skin integrity will decrease 06/27/2023 1317 by Conard Jon HERO, RN Outcome: Adequate for Discharge 06/27/2023 1224 by Conard Jon HERO, RN Outcome: Progressing

## 2023-06-28 ENCOUNTER — Telehealth: Payer: Self-pay

## 2023-06-28 NOTE — Telephone Encounter (Signed)
 From [facility] call:    We reviewed her medication list.  She stated she doesn't have flexeril ,and she is not sure if she has cymbalta  or omprazole. She said her daughter is getting the thiamine  tomorrow and she is not sure about the prednisolone . She went on to say that her daughter was told one med is $3000.   She was quite confused about her medications and she said her daughter will need to review the med list when she returns home. I instructed her to please call me with any questions after she reviews them . I also told her to have her daughter contact the pharmacy about the prednisolone  and what medication costs $3000 and she said she would have her daughter check on that too.

## 2023-06-28 NOTE — Transitions of Care (Post Inpatient/ED Visit) (Signed)
 06/28/2023  Name: Elizabeth Bush MRN: [id number] DOB: 1971/03/15  Today's TOC FU Call Status: Today's TOC FU Call Status:: Successful TOC FU Call Completed TOC FU Call Complete Date: 06/28/23 Patient's Name and Date of Birth confirmed.  Transition Care Management Follow-up Telephone Call Date of Discharge: 06/27/23 Type of Discharge: Inpatient Admission Primary Inpatient Discharge Diagnosis:: GI Bleed How have you been since you were released from the hospital?: Same (She said her daughter still needs to get some of her medications.) Any questions or concerns?: No  Items Reviewed: Did you receive and understand the discharge instructions provided?: Yes Medications obtained,verified, and reconciled?: Yes (Medications Reviewed) (She stated she doesn't have flexeril ,and she is not sure if she has cymbalta  or omprazole. She said her daughter is getting the thiamine  tomorrow and she is not sure about the prednisolone . She went on to say that her daughter was told one med is $3000.) Any new allergies since your discharge?: No Dietary orders reviewed?: Yes Type of Diet Ordered:: heart healthy, low sodium Do you have support at home?: Yes People in Home: child(ren), adult Name of Support/Comfort Primary Source: She said her daughter, Kazia, stays with her.  Medications Reviewed Today: Medications Reviewed Today     Reviewed by Burnett Carson, RN (Case Manager) on 06/28/23 at 1648  Med List Status: <None>   Medication Order Taking? Sig Documenting Provider Last Dose Status Informant  amLODipine  (NORVASC ) 10 MG tablet [id number] No Take 1 tablet (10 mg total) by mouth daily. For high blood pressure Lawrance Presume, MD Past Month Active Self, Pharmacy Records  cyclobenzaprine  (FLEXERIL ) 5 MG tablet [id number] No Take 1 tablet (5 mg total) by mouth at bedtime. For muscle pain Hassie Lint, PA-C Past Month Active Self, Pharmacy Records  DULoxetine  (CYMBALTA ) 30 MG capsule [id number] No Take 1  capsule (30 mg total) by mouth every morning. Helps with depression and chronic pain Jonelle Neri Stan Eans, PA-C Past Month Active Self, Pharmacy Records  ferrous sulfate  325 (65 FE) MG tablet [id number]  Take 1 tablet (325 mg total) by mouth every other day. Jodeane Mulligan, DO  Active   hydrocortisone  (ANUSOL -HC) 2.5 % rectal cream [id number]  Place rectally 2 (two) times daily. Jodeane Mulligan, DO  Active   Misc. Devices MISC [id number] No Rollator Walker DX M17.0 Lawrance Presume, MD Taking Active Self, Pharmacy Records  omeprazole  [facility]) 20 MG capsule [id number] No TAKE ONE CAPSULE BY MOUTH ONCE DAILY Lawrance Presume, MD Past Month Active Self, Pharmacy Records  prednisoLONE  5 MG TABS tablet [id number]  Take 8 tablets (40 mg total) by mouth daily for 23 days. Jodeane Mulligan, DO  Active   thiamine  (VITAMIN B-1) 100 MG tablet [id number]  Take 1 tablet (100 mg total) by mouth daily. Jodeane Mulligan, DO  Active             Home Care and Equipment/Supplies: Were Home Health Services Ordered?: No Any new equipment or medical supplies ordered?: No  Functional Questionnaire: Do you need assistance with bathing/showering or dressing?: No Do you need assistance with meal preparation?: Yes (her daughter assists) Do you need assistance with eating?: No Do you have difficulty maintaining continence: No Do you need assistance with getting out of bed/getting out of a chair/moving?: Yes (ambulates with a  rollator.  she said she needs assistance navigating the stairs,) Do you have difficulty managing or taking your medications?: Yes  Follow up appointments reviewed: PCP Follow-up appointment confirmed?: Yes Date of  PCP follow-up appointment?: 08/12/23 Follow-up Provider: [facility] Follow-up appointment confirmed?: Yes Date of Specialist follow-up appointment?: 06/30/23 Follow-Up Specialty Provider:: She is to have labs drawn at GI.   07/09/2023- mammogram Do you need  transportation to your follow-up appointment?: No Do you understand care options if your condition(s) worsen?: Yes-patient verbalized understanding  She was quite confused about her medications and she said her daughter will need to review the med list when she returns home. I instructed her to please call me with any questions after she reviews them . I also told her to have her daughter contact the pharmacy about the prednisolone  and what medication costs $3000.  She said she would have her daughter check on that too.     SIGNATURE Burnett Carson, RN

## 2023-06-30 ENCOUNTER — Other Ambulatory Visit (HOSPITAL_COMMUNITY)
Admission: RE | Admit: 2023-06-30 | Discharge: 2023-06-30 | Disposition: A | Discharge status: Home / Self Care | Payer: Medicaid Other | Source: Ambulatory Visit | Attending: Internal Medicine | Admitting: Internal Medicine

## 2023-06-30 ENCOUNTER — Telehealth: Payer: Self-pay | Admitting: Internal Medicine

## 2023-06-30 DIAGNOSIS — K7010 Alcoholic hepatitis without ascites: Secondary | ICD-10-CM | POA: Diagnosis not present

## 2023-06-30 LAB — COMPREHENSIVE METABOLIC PANEL
ALT: 62 U/L — ABNORMAL HIGH (ref 0–44)
AST: 133 U/L — ABNORMAL HIGH (ref 15–41)
Albumin: 2.2 g/dL — ABNORMAL LOW (ref 3.5–5.0)
Alkaline Phosphatase: 67 U/L (ref 38–126)
Anion gap: 9 (ref 5–15)
BUN: 16 mg/dL (ref 6–20)
CO2: 19 mmol/L — ABNORMAL LOW (ref 22–32)
Calcium: 8.6 mg/dL — ABNORMAL LOW (ref 8.9–10.3)
Chloride: 106 mmol/L (ref 98–111)
Creatinine, Ser: 0.9 mg/dL (ref 0.44–1.00)
GFR, Estimated: >60 mL/min (ref >60)
Glucose, Bld: 118 mg/dL — ABNORMAL HIGH (ref 70–99)
Potassium: 3.3 mmol/L — ABNORMAL LOW (ref 3.5–5.1)
Sodium: 134 mmol/L — ABNORMAL LOW (ref 135–145)
Total Bilirubin: 4.4 mg/dL — ABNORMAL HIGH (ref 0.0–1.2)
Total Protein: 7.2 g/dL (ref 6.5–8.1)

## 2023-06-30 LAB — CBC
HCT: 23.2 % — ABNORMAL LOW (ref 36.0–46.0)
Hemoglobin: 7.5 g/dL — ABNORMAL LOW (ref 12.0–15.0)
MCH: 30.5 pg (ref 26.0–34.0)
MCHC: 32.3 g/dL (ref 30.0–36.0)
MCV: 94.3 fL (ref 80.0–100.0)
Platelets: 228 10*3/uL (ref 150–400)
RBC: 2.46 MIL/uL — ABNORMAL LOW (ref 3.87–5.11)
RDW: 21.1 % — ABNORMAL HIGH (ref 11.5–15.5)
WBC: 13.8 10*3/uL — ABNORMAL HIGH (ref 4.0–10.5)
nRBC: 0 % (ref 0.0–0.2)

## 2023-06-30 LAB — VITAMIN B1: Vitamin B1 (Thiamine): 60 nmol/L — ABNORMAL LOW (ref 66.5–200.0)

## 2023-06-30 LAB — PROTIME-INR
INR: 1.9 — ABNORMAL HIGH (ref 0.8–1.2)
Prothrombin Time: 21.7 s — ABNORMAL HIGH (ref 11.4–15.2)

## 2023-06-30 NOTE — Telephone Encounter (Signed)
Patient of Dr. Margo Aye from hospital last week - alcoholic hepatitis + bleeding hemorrhoids  Labs show biochemical improvement in discriminant function + Lelle score  1) Stay on prednisolone  2) Stay off EtOH  Please have her see APP next available (JLL saw her in hospital so would be a good choice) - looks like there are some ready or upcoming hold spots

## 2023-06-30 NOTE — Telephone Encounter (Signed)
Attempted to reach patient by phone. Busy signal x 2. Will attempt to reach her again at a later time.

## 2023-07-01 NOTE — Telephone Encounter (Signed)
Again attempted to reach patient and got busy signal. Will send mychart message as well as letter to her home address.

## 2023-07-06 ENCOUNTER — Telehealth: Payer: Self-pay

## 2023-07-06 DIAGNOSIS — F419 Anxiety disorder, unspecified: Secondary | ICD-10-CM

## 2023-07-06 DIAGNOSIS — G8921 Chronic pain due to trauma: Secondary | ICD-10-CM

## 2023-07-06 DIAGNOSIS — M545 Low back pain, unspecified: Secondary | ICD-10-CM

## 2023-07-06 DIAGNOSIS — I10 Essential (primary) hypertension: Secondary | ICD-10-CM

## 2023-07-06 NOTE — Telephone Encounter (Signed)
Patient came in requesting to be seen today but unfortunately we have no openings for Dr Laural Benes, this was expressed to the patient she also stated she wanted these medications delivered to her house if possible.   Amlodipine 10 MG  Celecoxib 200 MG Vitamin B-1 100 MG Omeprazole 20 MG Ferrous Sulf 325 MG  Duloxetine HCL 30 MG

## 2023-07-07 MED ORDER — AMLODIPINE BESYLATE 10 MG PO TABS
10.0000 mg | ORAL_TABLET | Freq: Every day | ORAL | 1 refills | Status: DC
Start: 2023-07-07 — End: 2023-08-18

## 2023-07-07 MED ORDER — VITAMIN B-1 100 MG PO TABS
100.0000 mg | ORAL_TABLET | Freq: Every day | ORAL | 1 refills | Status: DC
Start: 2023-07-07 — End: 2023-08-18

## 2023-07-07 MED ORDER — DULOXETINE HCL 30 MG PO CPEP: 30.0000 mg | ORAL_CAPSULE | Freq: Every morning | ORAL | 1 refills | Status: DC

## 2023-07-07 MED ORDER — FERROUS SULFATE 325 (65 FE) MG PO TABS: 325.0000 mg | ORAL_TABLET | ORAL | 1 refills | Status: DC

## 2023-07-07 MED ORDER — OMEPRAZOLE 20 MG PO CPDR: 20.0000 mg | DELAYED_RELEASE_CAPSULE | Freq: Every day | ORAL | 1 refills | Status: DC

## 2023-07-07 NOTE — Addendum Note (Signed)
Addended by: Jonah Blue B on: 07/07/2023 05:14 PM   Modules accepted: Orders

## 2023-07-07 NOTE — Telephone Encounter (Signed)
Please see patient's message. Can one of you please call her and find out which pharmacy she wants me to send these prescriptions to.

## 2023-07-07 NOTE — Telephone Encounter (Signed)
Patient would like refill sent to [facility].

## 2023-07-08 ENCOUNTER — Telehealth: Payer: Self-pay

## 2023-07-08 NOTE — Telephone Encounter (Signed)
Copied from CRM [id number]. Topic: Clinical - Home Health Verbal Orders >> Jul 08, 2023  1:29 PM Fuller Mandril wrote: Caller/Agency: Patient  Callback Number: [phone] Service Requested: N/A - Patient states she received a bath chair around 2021 and it is now broken. Requesting order for new bath chair and would like a longer one if possible due to its hard for her to get in and out of tub.  Frequency: N/A Any new concerns about the patient? N/A

## 2023-07-08 NOTE — Telephone Encounter (Signed)
Spoke with patient . Advised that since it has been less than 5 years that her insurance is not going to cover the cost of a new tab chair. Patient and I looked at at few other options like consignment or purchase online. Patient voiced that she would get her daughter to help her look for a new to order online.

## 2023-07-09 ENCOUNTER — Encounter: Payer: Self-pay | Admitting: Hematology

## 2023-07-09 ENCOUNTER — Other Ambulatory Visit: Payer: Self-pay

## 2023-07-09 ENCOUNTER — Telehealth: Payer: Self-pay

## 2023-07-09 ENCOUNTER — Other Ambulatory Visit: Payer: Medicaid Other

## 2023-07-09 NOTE — Telephone Encounter (Signed)
Call to [facility] with [facility] . Verified that medication , prednisoLONE 5 MG TABS tablet needs PA. Hailey voiced that even though medication was written by hospital primary is required to get PA.

## 2023-07-09 NOTE — Telephone Encounter (Signed)
Copied from CRM [id number]. Topic: Clinical - Prescription Issue >> Jul 09, 2023 11:23 AM Shelah Lewandowsky wrote: Reason for CRM: Hailey with [facility] prednisoLONE 5 MG TABS tablet  needs prior Berkley Harvey, was ordered by hospital  [phone]

## 2023-07-09 NOTE — Telephone Encounter (Signed)
Pharmacy Patient Advocate Encounter   Received notification from Physician's Office that prior authorization for PREDNISOLONE TABLETS is required/requested.   Insurance verification completed.   The patient is insured through [facility] [place] .   Per test claim: PA required; PA submitted to above mentioned insurance via CoverMyMeds Key/confirmation #/EOC [id number] Status is pending

## 2023-07-14 ENCOUNTER — Other Ambulatory Visit: Payer: Self-pay

## 2023-07-14 NOTE — Patient Instructions (Signed)
 Visit Information  Elizabeth Bush was given information about Medicaid Managed Care team care coordination services as a part of their [facility] Medicaid benefit. Elizabeth Bush verbally consented to engagement with the [facility] Managed Care team.   If you are experiencing a medical emergency, please call 911 or report to your local emergency department or urgent care.   If you have a non-emergency medical problem during routine business hours, please contact your provider's office and ask to speak with a nurse.   For questions related to your [facility] health plan, please call: [phone] or go here:[email or web address]  If you would like to schedule transportation through your [facility] plan, please call the following number at least 2 days in advance of your appointment: [phone].   You can also use the MTM portal or MTM mobile app to manage your rides. Reimbursement for transportation is available through [facility]! For the portal, please go to [email or web address]  Call the [facility] Crisis Line at [phone], at any time, 24 hours a day, 7 days a week. If you are in danger or need immediate medical attention call 911.  If you would like help to quit smoking, call 1-800-QUIT-NOW (267-143-2970) OR Espaol: 1-855-Djelo-Ya (6-440-347-4259) o para ms informacin haga clic aqu or Text READY to 563-875 to register via text  Elizabeth Bush - following are the goals we discussed in your visit today:   Goals Addressed   None      Social Worker will follow up in 30 days.   Elizabeth Bush, Elizabeth Bush, MHA [facility]  Managed Medicaid Social Worker [phone]   Following is a copy of your plan of care:  There are no care plans that you recently modified to display for this patient.
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
## 2023-07-17 DIAGNOSIS — Z419 Encounter for procedure for purposes other than remedying health state, unspecified: Secondary | ICD-10-CM | POA: Diagnosis not present

## 2023-08-03 ENCOUNTER — Inpatient Hospital Stay: Payer: Medicaid Other | Admitting: Internal Medicine

## 2023-08-07 ENCOUNTER — Inpatient Hospital Stay (HOSPITAL_COMMUNITY)
Admission: EM | Admit: 2023-08-07 | Discharge: 2023-09-16 | DRG: 870 | Disposition: E | Attending: Pulmonary Disease | Admitting: Pulmonary Disease

## 2023-08-07 ENCOUNTER — Emergency Department (HOSPITAL_COMMUNITY)

## 2023-08-07 ENCOUNTER — Other Ambulatory Visit: Payer: Self-pay

## 2023-08-07 ENCOUNTER — Inpatient Hospital Stay (HOSPITAL_COMMUNITY)

## 2023-08-07 DIAGNOSIS — E8722 Chronic metabolic acidosis: Secondary | ICD-10-CM | POA: Diagnosis not present

## 2023-08-07 DIAGNOSIS — J9600 Acute respiratory failure, unspecified whether with hypoxia or hypercapnia: Secondary | ICD-10-CM | POA: Diagnosis not present

## 2023-08-07 DIAGNOSIS — K767 Hepatorenal syndrome: Secondary | ICD-10-CM | POA: Diagnosis present

## 2023-08-07 DIAGNOSIS — K7031 Alcoholic cirrhosis of liver with ascites: Secondary | ICD-10-CM | POA: Diagnosis not present

## 2023-08-07 DIAGNOSIS — Z6841 Body Mass Index (BMI) 40.0 and over, adult: Secondary | ICD-10-CM | POA: Diagnosis not present

## 2023-08-07 DIAGNOSIS — E162 Hypoglycemia, unspecified: Secondary | ICD-10-CM | POA: Diagnosis not present

## 2023-08-07 DIAGNOSIS — Z1152 Encounter for screening for COVID-19: Secondary | ICD-10-CM

## 2023-08-07 DIAGNOSIS — I129 Hypertensive chronic kidney disease with stage 1 through stage 4 chronic kidney disease, or unspecified chronic kidney disease: Secondary | ICD-10-CM | POA: Diagnosis present

## 2023-08-07 DIAGNOSIS — R932 Abnormal findings on diagnostic imaging of liver and biliary tract: Secondary | ICD-10-CM | POA: Diagnosis not present

## 2023-08-07 DIAGNOSIS — N858 Other specified noninflammatory disorders of uterus: Secondary | ICD-10-CM | POA: Diagnosis not present

## 2023-08-07 DIAGNOSIS — Z885 Allergy status to narcotic agent status: Secondary | ICD-10-CM

## 2023-08-07 DIAGNOSIS — F32A Depression, unspecified: Secondary | ICD-10-CM | POA: Diagnosis present

## 2023-08-07 DIAGNOSIS — Y95 Nosocomial condition: Secondary | ICD-10-CM | POA: Diagnosis present

## 2023-08-07 DIAGNOSIS — R410 Disorientation, unspecified: Secondary | ICD-10-CM | POA: Diagnosis not present

## 2023-08-07 DIAGNOSIS — D62 Acute posthemorrhagic anemia: Secondary | ICD-10-CM | POA: Diagnosis present

## 2023-08-07 DIAGNOSIS — K7460 Unspecified cirrhosis of liver: Secondary | ICD-10-CM | POA: Diagnosis not present

## 2023-08-07 DIAGNOSIS — R197 Diarrhea, unspecified: Secondary | ICD-10-CM | POA: Diagnosis not present

## 2023-08-07 DIAGNOSIS — E8770 Fluid overload, unspecified: Secondary | ICD-10-CM | POA: Diagnosis not present

## 2023-08-07 DIAGNOSIS — J80 Acute respiratory distress syndrome: Secondary | ICD-10-CM | POA: Diagnosis not present

## 2023-08-07 DIAGNOSIS — R000 Tachycardia, unspecified: Secondary | ICD-10-CM | POA: Diagnosis not present

## 2023-08-07 DIAGNOSIS — M1990 Unspecified osteoarthritis, unspecified site: Secondary | ICD-10-CM | POA: Diagnosis present

## 2023-08-07 DIAGNOSIS — R609 Edema, unspecified: Secondary | ICD-10-CM | POA: Diagnosis not present

## 2023-08-07 DIAGNOSIS — K6389 Other specified diseases of intestine: Secondary | ICD-10-CM | POA: Diagnosis not present

## 2023-08-07 DIAGNOSIS — G9340 Encephalopathy, unspecified: Secondary | ICD-10-CM

## 2023-08-07 DIAGNOSIS — Z781 Physical restraint status: Secondary | ICD-10-CM

## 2023-08-07 DIAGNOSIS — R0989 Other specified symptoms and signs involving the circulatory and respiratory systems: Secondary | ICD-10-CM | POA: Diagnosis not present

## 2023-08-07 DIAGNOSIS — D509 Iron deficiency anemia, unspecified: Secondary | ICD-10-CM | POA: Diagnosis present

## 2023-08-07 DIAGNOSIS — K625 Hemorrhage of anus and rectum: Secondary | ICD-10-CM | POA: Diagnosis not present

## 2023-08-07 DIAGNOSIS — N281 Cyst of kidney, acquired: Secondary | ICD-10-CM | POA: Diagnosis not present

## 2023-08-07 DIAGNOSIS — K5730 Diverticulosis of large intestine without perforation or abscess without bleeding: Secondary | ICD-10-CM | POA: Diagnosis not present

## 2023-08-07 DIAGNOSIS — J189 Pneumonia, unspecified organism: Secondary | ICD-10-CM | POA: Diagnosis not present

## 2023-08-07 DIAGNOSIS — D649 Anemia, unspecified: Secondary | ICD-10-CM | POA: Diagnosis not present

## 2023-08-07 DIAGNOSIS — K922 Gastrointestinal hemorrhage, unspecified: Secondary | ICD-10-CM | POA: Diagnosis present

## 2023-08-07 DIAGNOSIS — E872 Acidosis, unspecified: Secondary | ICD-10-CM | POA: Diagnosis not present

## 2023-08-07 DIAGNOSIS — R112 Nausea with vomiting, unspecified: Secondary | ICD-10-CM | POA: Diagnosis not present

## 2023-08-07 DIAGNOSIS — R0689 Other abnormalities of breathing: Secondary | ICD-10-CM | POA: Diagnosis not present

## 2023-08-07 DIAGNOSIS — J90 Pleural effusion, not elsewhere classified: Secondary | ICD-10-CM | POA: Diagnosis not present

## 2023-08-07 DIAGNOSIS — Z5982 Transportation insecurity: Secondary | ICD-10-CM

## 2023-08-07 DIAGNOSIS — R6521 Severe sepsis with septic shock: Secondary | ICD-10-CM | POA: Diagnosis present

## 2023-08-07 DIAGNOSIS — F1010 Alcohol abuse, uncomplicated: Secondary | ICD-10-CM | POA: Diagnosis present

## 2023-08-07 DIAGNOSIS — A419 Sepsis, unspecified organism: Secondary | ICD-10-CM | POA: Diagnosis not present

## 2023-08-07 DIAGNOSIS — F419 Anxiety disorder, unspecified: Secondary | ICD-10-CM | POA: Diagnosis present

## 2023-08-07 DIAGNOSIS — R4182 Altered mental status, unspecified: Secondary | ICD-10-CM | POA: Diagnosis not present

## 2023-08-07 DIAGNOSIS — I471 Supraventricular tachycardia, unspecified: Secondary | ICD-10-CM | POA: Diagnosis not present

## 2023-08-07 DIAGNOSIS — R188 Other ascites: Secondary | ICD-10-CM | POA: Diagnosis not present

## 2023-08-07 DIAGNOSIS — L89312 Pressure ulcer of right buttock, stage 2: Secondary | ICD-10-CM | POA: Diagnosis present

## 2023-08-07 DIAGNOSIS — R578 Other shock: Secondary | ICD-10-CM | POA: Diagnosis not present

## 2023-08-07 DIAGNOSIS — R140 Abdominal distension (gaseous): Secondary | ICD-10-CM | POA: Diagnosis not present

## 2023-08-07 DIAGNOSIS — L8990 Pressure ulcer of unspecified site, unspecified stage: Secondary | ICD-10-CM | POA: Insufficient documentation

## 2023-08-07 DIAGNOSIS — Z515 Encounter for palliative care: Secondary | ICD-10-CM | POA: Diagnosis not present

## 2023-08-07 DIAGNOSIS — G8929 Other chronic pain: Secondary | ICD-10-CM | POA: Diagnosis present

## 2023-08-07 DIAGNOSIS — J13 Pneumonia due to Streptococcus pneumoniae: Secondary | ICD-10-CM | POA: Diagnosis not present

## 2023-08-07 DIAGNOSIS — D689 Coagulation defect, unspecified: Secondary | ICD-10-CM | POA: Diagnosis present

## 2023-08-07 DIAGNOSIS — K760 Fatty (change of) liver, not elsewhere classified: Secondary | ICD-10-CM | POA: Diagnosis not present

## 2023-08-07 DIAGNOSIS — D500 Iron deficiency anemia secondary to blood loss (chronic): Secondary | ICD-10-CM | POA: Diagnosis not present

## 2023-08-07 DIAGNOSIS — R404 Transient alteration of awareness: Secondary | ICD-10-CM | POA: Diagnosis not present

## 2023-08-07 DIAGNOSIS — R079 Chest pain, unspecified: Secondary | ICD-10-CM | POA: Diagnosis not present

## 2023-08-07 DIAGNOSIS — Z8619 Personal history of other infectious and parasitic diseases: Secondary | ICD-10-CM

## 2023-08-07 DIAGNOSIS — R296 Repeated falls: Secondary | ICD-10-CM | POA: Diagnosis present

## 2023-08-07 DIAGNOSIS — Z4682 Encounter for fitting and adjustment of non-vascular catheter: Secondary | ICD-10-CM | POA: Diagnosis not present

## 2023-08-07 DIAGNOSIS — N179 Acute kidney failure, unspecified: Secondary | ICD-10-CM | POA: Diagnosis not present

## 2023-08-07 DIAGNOSIS — Z5986 Financial insecurity: Secondary | ICD-10-CM

## 2023-08-07 DIAGNOSIS — Z7189 Other specified counseling: Secondary | ICD-10-CM

## 2023-08-07 DIAGNOSIS — K7030 Alcoholic cirrhosis of liver without ascites: Secondary | ICD-10-CM | POA: Diagnosis not present

## 2023-08-07 DIAGNOSIS — E441 Mild protein-calorie malnutrition: Secondary | ICD-10-CM | POA: Diagnosis present

## 2023-08-07 DIAGNOSIS — J9601 Acute respiratory failure with hypoxia: Secondary | ICD-10-CM

## 2023-08-07 DIAGNOSIS — Z66 Do not resuscitate: Secondary | ICD-10-CM | POA: Diagnosis present

## 2023-08-07 DIAGNOSIS — E878 Other disorders of electrolyte and fluid balance, not elsewhere classified: Secondary | ICD-10-CM | POA: Diagnosis not present

## 2023-08-07 DIAGNOSIS — K7682 Hepatic encephalopathy: Secondary | ICD-10-CM | POA: Diagnosis not present

## 2023-08-07 DIAGNOSIS — K7011 Alcoholic hepatitis with ascites: Secondary | ICD-10-CM | POA: Diagnosis not present

## 2023-08-07 DIAGNOSIS — R4589 Other symptoms and signs involving emotional state: Secondary | ICD-10-CM

## 2023-08-07 DIAGNOSIS — R062 Wheezing: Secondary | ICD-10-CM | POA: Diagnosis not present

## 2023-08-07 DIAGNOSIS — K7290 Hepatic failure, unspecified without coma: Secondary | ICD-10-CM | POA: Diagnosis not present

## 2023-08-07 DIAGNOSIS — J811 Chronic pulmonary edema: Secondary | ICD-10-CM | POA: Diagnosis not present

## 2023-08-07 DIAGNOSIS — E871 Hypo-osmolality and hyponatremia: Secondary | ICD-10-CM | POA: Diagnosis present

## 2023-08-07 DIAGNOSIS — E870 Hyperosmolality and hypernatremia: Secondary | ICD-10-CM | POA: Diagnosis present

## 2023-08-07 DIAGNOSIS — K921 Melena: Secondary | ICD-10-CM | POA: Diagnosis not present

## 2023-08-07 DIAGNOSIS — K7581 Nonalcoholic steatohepatitis (NASH): Secondary | ICD-10-CM | POA: Diagnosis present

## 2023-08-07 DIAGNOSIS — R0600 Dyspnea, unspecified: Secondary | ICD-10-CM | POA: Diagnosis not present

## 2023-08-07 DIAGNOSIS — K7040 Alcoholic hepatic failure without coma: Secondary | ICD-10-CM | POA: Diagnosis present

## 2023-08-07 DIAGNOSIS — R001 Bradycardia, unspecified: Secondary | ICD-10-CM | POA: Diagnosis not present

## 2023-08-07 DIAGNOSIS — Z452 Encounter for adjustment and management of vascular access device: Secondary | ICD-10-CM | POA: Diagnosis not present

## 2023-08-07 DIAGNOSIS — E876 Hypokalemia: Secondary | ICD-10-CM | POA: Diagnosis not present

## 2023-08-07 DIAGNOSIS — R34 Anuria and oliguria: Secondary | ICD-10-CM | POA: Diagnosis present

## 2023-08-07 DIAGNOSIS — R579 Shock, unspecified: Secondary | ICD-10-CM | POA: Diagnosis not present

## 2023-08-07 DIAGNOSIS — N170 Acute kidney failure with tubular necrosis: Secondary | ICD-10-CM | POA: Diagnosis not present

## 2023-08-07 DIAGNOSIS — Z888 Allergy status to other drugs, medicaments and biological substances status: Secondary | ICD-10-CM

## 2023-08-07 DIAGNOSIS — N1832 Chronic kidney disease, stage 3b: Secondary | ICD-10-CM | POA: Diagnosis present

## 2023-08-07 DIAGNOSIS — K7201 Acute and subacute hepatic failure with coma: Secondary | ICD-10-CM | POA: Diagnosis not present

## 2023-08-07 DIAGNOSIS — R918 Other nonspecific abnormal finding of lung field: Secondary | ICD-10-CM | POA: Diagnosis not present

## 2023-08-07 DIAGNOSIS — D72829 Elevated white blood cell count, unspecified: Secondary | ICD-10-CM | POA: Diagnosis not present

## 2023-08-07 DIAGNOSIS — E8721 Acute metabolic acidosis: Secondary | ICD-10-CM | POA: Diagnosis not present

## 2023-08-07 DIAGNOSIS — Z96653 Presence of artificial knee joint, bilateral: Secondary | ICD-10-CM | POA: Diagnosis present

## 2023-08-07 DIAGNOSIS — K219 Gastro-esophageal reflux disease without esophagitis: Secondary | ICD-10-CM | POA: Diagnosis present

## 2023-08-07 DIAGNOSIS — Z79899 Other long term (current) drug therapy: Secondary | ICD-10-CM

## 2023-08-07 DIAGNOSIS — M7989 Other specified soft tissue disorders: Secondary | ICD-10-CM | POA: Diagnosis present

## 2023-08-07 DIAGNOSIS — Z8249 Family history of ischemic heart disease and other diseases of the circulatory system: Secondary | ICD-10-CM

## 2023-08-07 DIAGNOSIS — F17210 Nicotine dependence, cigarettes, uncomplicated: Secondary | ICD-10-CM | POA: Diagnosis present

## 2023-08-07 DIAGNOSIS — I517 Cardiomegaly: Secondary | ICD-10-CM | POA: Diagnosis not present

## 2023-08-07 DIAGNOSIS — R0602 Shortness of breath: Secondary | ICD-10-CM | POA: Diagnosis not present

## 2023-08-07 DIAGNOSIS — D696 Thrombocytopenia, unspecified: Secondary | ICD-10-CM | POA: Diagnosis not present

## 2023-08-07 DIAGNOSIS — R569 Unspecified convulsions: Secondary | ICD-10-CM | POA: Diagnosis not present

## 2023-08-07 DIAGNOSIS — E66813 Obesity, class 3: Secondary | ICD-10-CM | POA: Diagnosis present

## 2023-08-07 DIAGNOSIS — K649 Unspecified hemorrhoids: Secondary | ICD-10-CM | POA: Diagnosis present

## 2023-08-07 LAB — BLOOD GAS, VENOUS
Acid-base deficit: 19.5 mmol/L — ABNORMAL HIGH (ref 0.0–2.0)
Bicarbonate: 10.2 mmol/L — ABNORMAL LOW (ref 20.0–28.0)
O2 Saturation: 97.7 %
Patient temperature: 37
pCO2, Ven: 37 mmHg — ABNORMAL LOW (ref 44–60)
pH, Ven: 7.05 — CL (ref 7.25–7.43)
pO2, Ven: 96 mmHg — ABNORMAL HIGH (ref 32–45)

## 2023-08-07 LAB — I-STAT CHEM 8, ED
BUN: 14 mg/dL (ref 6–20)
Calcium, Ion: 1.03 mmol/L — ABNORMAL LOW (ref 1.15–1.40)
Chloride: 105 mmol/L (ref 98–111)
Creatinine, Ser: 1.8 mg/dL — ABNORMAL HIGH (ref 0.44–1.00)
Glucose, Bld: 99 mg/dL (ref 70–99)
HCT: 17 % — ABNORMAL LOW (ref 36.0–46.0)
Hemoglobin: 5.8 g/dL — CL (ref 12.0–15.0)
Potassium: 2.8 mmol/L — ABNORMAL LOW (ref 3.5–5.1)
Sodium: 136 mmol/L (ref 135–145)
TCO2: 13 mmol/L — ABNORMAL LOW (ref 22–32)

## 2023-08-07 LAB — CBC WITH DIFFERENTIAL/PLATELET
Abs Immature Granulocytes: 0.25 10*3/uL — ABNORMAL HIGH (ref 0.00–0.07)
Basophils Absolute: 0 10*3/uL (ref 0.0–0.1)
Basophils Relative: 0 %
Eosinophils Absolute: 0.1 10*3/uL (ref 0.0–0.5)
Eosinophils Relative: 1 %
HCT: 15.8 % — ABNORMAL LOW (ref 36.0–46.0)
Hemoglobin: 5 g/dL — CL (ref 12.0–15.0)
Immature Granulocytes: 2 %
Lymphocytes Relative: 9 %
Lymphs Abs: 1 10*3/uL (ref 0.7–4.0)
MCH: 30.3 pg (ref 26.0–34.0)
MCHC: 31.6 g/dL (ref 30.0–36.0)
MCV: 95.8 fL (ref 80.0–100.0)
Monocytes Absolute: 1 10*3/uL (ref 0.1–1.0)
Monocytes Relative: 9 %
Neutro Abs: 8.6 10*3/uL — ABNORMAL HIGH (ref 1.7–7.7)
Neutrophils Relative %: 79 %
Platelets: 187 10*3/uL (ref 150–400)
RBC: 1.65 MIL/uL — ABNORMAL LOW (ref 3.87–5.11)
RDW: 18.2 % — ABNORMAL HIGH (ref 11.5–15.5)
WBC: 10.8 10*3/uL — ABNORMAL HIGH (ref 4.0–10.5)
nRBC: 0.7 % — ABNORMAL HIGH (ref 0.0–0.2)

## 2023-08-07 LAB — PROTIME-INR
INR: 2.7 — ABNORMAL HIGH (ref 0.8–1.2)
Prothrombin Time: 28.9 s — ABNORMAL HIGH (ref 11.4–15.2)

## 2023-08-07 LAB — BLOOD GAS, ARTERIAL
Acid-base deficit: 18.8 mmol/L — ABNORMAL HIGH (ref 0.0–2.0)
Acid-base deficit: 19.4 mmol/L — ABNORMAL HIGH (ref 0.0–2.0)
Bicarbonate: 9.5 mmol/L — ABNORMAL LOW (ref 20.0–28.0)
Bicarbonate: 8.9 mmol/L — ABNORMAL LOW (ref 20.0–28.0)
Drawn by: 22052
Drawn by: 11249
FIO2: 100 %
FIO2: 100 %
MECHVT: 490 mL
MECHVT: 490 mL
O2 Saturation: 100 %
O2 Saturation: 100 %
Patient temperature: 37
Patient temperature: 34.1
RATE: 20 {breaths}/min
RATE: 20 {breaths}/min
pCO2 arterial: 30 mmHg — ABNORMAL LOW (ref 32–48)
pCO2 arterial: 26 mmHg — ABNORMAL LOW (ref 32–48)
pH, Arterial: 7.11 — CL (ref 7.35–7.45)
pH, Arterial: 7.12 — CL (ref 7.35–7.45)
pO2, Arterial: 242 mmHg — ABNORMAL HIGH (ref 83–108)
pO2, Arterial: 282 mmHg — ABNORMAL HIGH (ref 83–108)

## 2023-08-07 LAB — GLUCOSE, CAPILLARY
Glucose-Capillary: 135 mg/dL — ABNORMAL HIGH (ref 70–99)
Glucose-Capillary: 187 mg/dL — ABNORMAL HIGH (ref 70–99)

## 2023-08-07 LAB — RESPIRATORY PANEL BY PCR

## 2023-08-07 LAB — COMPREHENSIVE METABOLIC PANEL
ALT: 24 U/L (ref 0–44)
AST: 55 U/L — ABNORMAL HIGH (ref 15–41)
Albumin: 2 g/dL — ABNORMAL LOW (ref 3.5–5.0)
Alkaline Phosphatase: 75 U/L (ref 38–126)
Anion gap: 14 (ref 5–15)
BUN: 17 mg/dL (ref 6–20)
CO2: 12 mmol/L — ABNORMAL LOW (ref 22–32)
Calcium: 7.9 mg/dL — ABNORMAL LOW (ref 8.9–10.3)
Chloride: 106 mmol/L (ref 98–111)
Creatinine, Ser: 1.72 mg/dL — ABNORMAL HIGH (ref 0.44–1.00)
GFR, Estimated: 35 mL/min — ABNORMAL LOW (ref >60)
Glucose, Bld: 107 mg/dL — ABNORMAL HIGH (ref 70–99)
Potassium: 2.8 mmol/L — ABNORMAL LOW (ref 3.5–5.1)
Sodium: 132 mmol/L — ABNORMAL LOW (ref 135–145)
Total Bilirubin: 5.8 mg/dL — ABNORMAL HIGH (ref 0.0–1.2)
Total Protein: 5.5 g/dL — ABNORMAL LOW (ref 6.5–8.1)

## 2023-08-07 LAB — RAPID URINE DRUG SCREEN, HOSP PERFORMED
Amphetamines: NOT DETECTED
Barbiturates: NOT DETECTED
Benzodiazepines: NOT DETECTED
Cocaine: NOT DETECTED
Opiates: NOT DETECTED
Tetrahydrocannabinol: NOT DETECTED

## 2023-08-07 LAB — TROPONIN I (HIGH SENSITIVITY)
Troponin I (High Sensitivity): 53 ng/L — ABNORMAL HIGH (ref <18)
Troponin I (High Sensitivity): 69 ng/L — ABNORMAL HIGH (ref <18)

## 2023-08-07 LAB — I-STAT CG4 LACTIC ACID, ED: Lactic Acid, Venous: 9.9 mmol/L (ref 0.5–1.9)

## 2023-08-07 LAB — CBG MONITORING, ED: Glucose-Capillary: 104 mg/dL — ABNORMAL HIGH (ref 70–99)

## 2023-08-07 LAB — RESP PANEL BY RT-PCR (RSV, FLU A&B, COVID)  RVPGX2
Influenza A by PCR: NEGATIVE
Influenza B by PCR: NEGATIVE
Resp Syncytial Virus by PCR: NEGATIVE
SARS Coronavirus 2 by RT PCR: NEGATIVE

## 2023-08-07 LAB — POC OCCULT BLOOD, ED: Fecal Occult Bld: NEGATIVE

## 2023-08-07 LAB — AMMONIA: Ammonia: 192 umol/L — ABNORMAL HIGH (ref 9–35)

## 2023-08-07 LAB — ETHANOL: Alcohol, Ethyl (B): <10 mg/dL (ref <10)

## 2023-08-07 LAB — PREPARE RBC (CROSSMATCH)

## 2023-08-07 LAB — STREP PNEUMONIAE URINARY ANTIGEN: Strep Pneumo Urinary Antigen: POSITIVE — AB

## 2023-08-07 MED ORDER — SODIUM BICARBONATE 8.4 % IV SOLN
100.0000 meq | Freq: Once | INTRAVENOUS | Status: AC
  Administered 2023-08-07: 100 meq via INTRAVENOUS
  Filled 2023-08-07: qty 100
  Filled 2023-08-07: qty 50

## 2023-08-07 MED ORDER — FENTANYL 2500MCG IN NS 250ML (10MCG/ML) PREMIX INFUSION
50.0000 ug/h | INTRAVENOUS | Status: DC
  Administered 2023-08-07: 50 ug/h via INTRAVENOUS
  Administered 2023-08-08 – 2023-08-10 (×4): 150 ug/h via INTRAVENOUS
  Administered 2023-08-10: 100 ug/h via INTRAVENOUS
  Filled 2023-08-07 (×7): qty 250

## 2023-08-07 MED ORDER — POTASSIUM CHLORIDE 10 MEQ/100ML IV SOLN
10.0000 meq | INTRAVENOUS | Status: AC
  Administered 2023-08-07 (×2): 10 meq via INTRAVENOUS
  Filled 2023-08-07 (×2): qty 100

## 2023-08-07 MED ORDER — SODIUM CHLORIDE 0.9 % IV SOLN
1.0000 g | Freq: Once | INTRAVENOUS | Status: DC
  Administered 2023-08-07: 1 g via INTRAVENOUS
  Filled 2023-08-07: qty 10

## 2023-08-07 MED ORDER — FENTANYL CITRATE PF 50 MCG/ML IJ SOSY
50.0000 ug | PREFILLED_SYRINGE | Freq: Once | INTRAMUSCULAR | Status: AC
  Administered 2023-08-07: 50 ug via INTRAVENOUS
  Filled 2023-08-07: qty 1

## 2023-08-07 MED ORDER — SODIUM CHLORIDE 0.9 % IV SOLN
10.0000 mL/h | Freq: Once | INTRAVENOUS | Status: AC
Start: 2023-08-07 — End: 2023-08-07
  Administered 2023-08-07: 10 mL/h via INTRAVENOUS

## 2023-08-07 MED ORDER — SODIUM CHLORIDE 0.9% IV SOLUTION: Freq: Once | INTRAVENOUS | Status: AC

## 2023-08-07 MED ORDER — SODIUM CHLORIDE 0.9 % IV SOLN: INTRAVENOUS | Status: AC | PRN

## 2023-08-07 MED ORDER — ETOMIDATE 2 MG/ML IV SOLN
20.0000 mg | Freq: Once | INTRAVENOUS | Status: AC
  Administered 2023-08-07: 20 mg via INTRAVENOUS
  Filled 2023-08-07: qty 10

## 2023-08-07 MED ORDER — INSULIN ASPART 100 UNIT/ML IJ SOLN
0.0000 [IU] | INTRAMUSCULAR | Status: DC
  Administered 2023-08-07 – 2023-08-08 (×4): 3 [IU] via SUBCUTANEOUS
  Administered 2023-08-08: 2 [IU] via SUBCUTANEOUS
  Administered 2023-08-08: 3 [IU] via SUBCUTANEOUS
  Administered 2023-08-09 – 2023-08-12 (×13): 2 [IU] via SUBCUTANEOUS
  Administered 2023-08-13: 3 [IU] via SUBCUTANEOUS
  Administered 2023-08-14 (×2): 2 [IU] via SUBCUTANEOUS
  Administered 2023-08-14: 3 [IU] via SUBCUTANEOUS
  Administered 2023-08-15 (×3): 2 [IU] via SUBCUTANEOUS
  Filled 2023-08-07: qty 0.15

## 2023-08-07 MED ORDER — SODIUM CHLORIDE 0.9 % IV SOLN: 500.0000 mg | Freq: Once | INTRAVENOUS | Status: DC

## 2023-08-07 MED ORDER — PIPERACILLIN-TAZOBACTAM 3.375 G IVPB
3.3750 g | Freq: Three times a day (TID) | INTRAVENOUS | Status: DC
  Administered 2023-08-07 – 2023-08-08 (×2): 3.375 g via INTRAVENOUS
  Filled 2023-08-07 (×2): qty 50

## 2023-08-07 MED ORDER — ROCURONIUM BROMIDE 10 MG/ML (PF) SYRINGE
100.0000 mg | PREFILLED_SYRINGE | Freq: Once | INTRAVENOUS | Status: AC
  Administered 2023-08-07: 100 mg via INTRAVENOUS
  Filled 2023-08-07: qty 10

## 2023-08-07 MED ORDER — PANTOPRAZOLE SODIUM 40 MG IV SOLR
40.0000 mg | Freq: Two times a day (BID) | INTRAVENOUS | Status: DC
  Administered 2023-08-08 – 2023-08-16 (×18): 40 mg via INTRAVENOUS
  Filled 2023-08-07 (×17): qty 10

## 2023-08-07 MED ORDER — SODIUM BICARBONATE 8.4 % IV SOLN
INTRAVENOUS | Status: DC
  Filled 2023-08-07 (×2): qty 1000
  Filled 2023-08-07: qty 150
  Filled 2023-08-07: qty 1000
  Filled 2023-08-07: qty 150

## 2023-08-07 MED ORDER — VANCOMYCIN HCL 1750 MG/350ML IV SOLN: 1750.0000 mg | INTRAVENOUS | Status: DC

## 2023-08-07 MED ORDER — POLYETHYLENE GLYCOL 3350 17 G PO PACK: 17.0000 g | PACK | Freq: Every day | ORAL | Status: DC | PRN

## 2023-08-07 MED ORDER — DOCUSATE SODIUM 100 MG PO CAPS: 100.0000 mg | ORAL_CAPSULE | Freq: Two times a day (BID) | ORAL | Status: DC | PRN

## 2023-08-07 MED ORDER — PANTOPRAZOLE SODIUM 40 MG IV SOLR
40.0000 mg | INTRAVENOUS | Status: AC
  Filled 2023-08-07: qty 10

## 2023-08-07 MED ORDER — FENTANYL CITRATE PF 50 MCG/ML IJ SOSY
50.0000 ug | PREFILLED_SYRINGE | INTRAMUSCULAR | Status: DC | PRN
  Administered 2023-08-11: 50 ug via INTRAVENOUS
  Filled 2023-08-07: qty 1

## 2023-08-07 MED ORDER — FENTANYL BOLUS VIA INFUSION
50.0000 ug | INTRAVENOUS | Status: DC | PRN
  Administered 2023-08-08 – 2023-08-10 (×8): 50 ug via INTRAVENOUS
  Administered 2023-08-11 (×3): 100 ug via INTRAVENOUS

## 2023-08-07 MED ORDER — FENTANYL CITRATE PF 50 MCG/ML IJ SOSY
50.0000 ug | PREFILLED_SYRINGE | INTRAMUSCULAR | Status: DC | PRN
  Administered 2023-08-08: 100 ug via INTRAVENOUS
  Administered 2023-08-09: 150 ug via INTRAVENOUS

## 2023-08-07 MED ORDER — SODIUM CHLORIDE 0.9 % IV SOLN
250.0000 mL | INTRAVENOUS | Status: AC
  Administered 2023-08-08: 250 mL via INTRAVENOUS

## 2023-08-07 MED ORDER — NOREPINEPHRINE 4 MG/250ML-% IV SOLN
0.0000 ug/min | INTRAVENOUS | Status: DC
  Administered 2023-08-08: 2 ug/min via INTRAVENOUS
  Filled 2023-08-07: qty 250

## 2023-08-07 MED ORDER — PROPOFOL 1000 MG/100ML IV EMUL
0.0000 ug/kg/min | INTRAVENOUS | Status: DC
  Administered 2023-08-07: 5 ug/kg/min via INTRAVENOUS
  Filled 2023-08-07: qty 100

## 2023-08-07 MED ORDER — FAMOTIDINE 20 MG PO TABS: 20.0000 mg | ORAL_TABLET | Freq: Two times a day (BID) | ORAL | Status: DC

## 2023-08-07 MED ORDER — VANCOMYCIN HCL 2000 MG/400ML IV SOLN
2000.0000 mg | Freq: Once | INTRAVENOUS | Status: AC
  Administered 2023-08-07: 2000 mg via INTRAVENOUS
  Filled 2023-08-07: qty 400

## 2023-08-07 NOTE — Progress Notes (Signed)
 Patient brought up from ED with 1 Unit of blood transfusing, no blood product identification sheet present on transfer. Transfusion stopped around 2045. Without adverse reactions. Charge nurse made aware

## 2023-08-07 NOTE — Progress Notes (Signed)
 Pt transported on the vent from ED to ICU with RN without any complications. Report given to ICU RT.

## 2023-08-07 NOTE — Progress Notes (Signed)
 RT Note: ABG collected with patient on ventilator and sent to lab

## 2023-08-07 NOTE — ED Triage Notes (Signed)
 Pt BIB EMS coming from home. Patient altered. Strong urine odor, not answering questions but nods head, does follows command. C/O shob and chest pain when asked.   EMS 98/58, HR 120, RR 30, 90%% EMS arrival Duoneb and solumedrol given en route 20 g Lac - CBG 145
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
## 2023-08-07 NOTE — ED Provider Notes (Signed)
 [place] EMERGENCY DEPARTMENT AT [facility] Provider Note   CSN: [id number] Arrival date & time: 08/07/23  1439     History {Add pertinent medical, surgical, social history, OB history to HPI:1} Chief Complaint  Patient presents with   Altered Mental Status   Shortness of Breath    Elizabeth Bush is a 53 y.o. female.   Altered Mental Status Shortness of Breath    Patient has a history of hypertension acid reflux depression anemia who was hospitalized back in February.  Patient that time was diagnosed with a GI bleed, alcoholic hepatitis without ascites bleeding internal hemorrhoids.  During her hospitalization patient was noted to have a glim globin of 4.1.  During her colonoscopy they noted friable bleeding hemorrhoids.  Patient had a MELD score of 60.  Ultrasound that time did not show any evidence of ascites.  Patient's daughter was able to provide history.  She states that she has been having bleeding ever since she left the hospital.  She has been getting worse over the last several days.  Patient has been weak and falling.  They had to call EMS to help her up.  Patient refused coming to the hospital.  Daughter states the last time the patient was speaking was yesterday.  The daughter went out to do some errands this morning.  Friend was checking on the patient and instructed her to call 911.  Patient had been refusing to come to the hospital but only came in today because she is more confused and not speaking.  Daughter states patient has not been drinking Alcohol since her last hospitalization  Home Medications Prior to Admission medications   Medication Sig Start Date End Date Taking? Authorizing Provider  amLODipine (NORVASC) 10 MG tablet Take 1 tablet (10 mg total) by mouth daily. For high blood pressure 07/07/23   Marcine Matar, MD  cyclobenzaprine (FLEXERIL) 5 MG tablet Take 1 tablet (5 mg total) by mouth at bedtime. For muscle pain 01/14/23   Georgian Co  M, PA-C  DULoxetine (CYMBALTA) 30 MG capsule Take 1 capsule (30 mg total) by mouth every morning. Helps with depression and chronic pain 07/07/23   Marcine Matar, MD  ferrous sulfate 325 (65 FE) MG tablet Take 1 tablet (325 mg total) by mouth every other day. 07/07/23   Marcine Matar, MD  hydrocortisone (ANUSOL-HC) 2.5 % rectal cream Place rectally 2 (two) times daily. 06/27/23   Deanna Artis, DO  Misc. Devices MISC Rollator Walker DX M17.0 09/27/20   Marcine Matar, MD  omeprazole (PRILOSEC) 20 MG capsule Take 1 capsule (20 mg total) by mouth daily. 07/07/23   Marcine Matar, MD  thiamine (VITAMIN B-1) 100 MG tablet Take 1 tablet (100 mg total) by mouth daily. 07/07/23   Marcine Matar, MD      Allergies    Dilaudid [hydromorphone] and Lisinopril    Review of Systems   Review of Systems  Respiratory:  Positive for shortness of breath.     Physical Exam Updated Vital Signs BP 119/67 (BP Location: Right Arm)   Pulse (!) 110   Temp (!) 97.4 F (36.3 C) (Oral)   Resp (!) 21   LMP  (LMP Unknown)   SpO2 100%  Physical Exam Vitals and nursing note reviewed.  Constitutional:      General: She is in acute distress.     Appearance: She is ill-appearing.  HENT:     Head: Normocephalic and atraumatic.  Right Ear: External ear normal.     Left Ear: External ear normal.  Eyes:     General: No scleral icterus.       Right eye: No discharge.        Left eye: No discharge.     Conjunctiva/sclera: Conjunctivae normal.  Neck:     Trachea: No tracheal deviation.  Cardiovascular:     Rate and Rhythm: Normal rate and regular rhythm.  Pulmonary:     Effort: Pulmonary effort is normal. No respiratory distress.     Breath sounds: Normal breath sounds. No stridor. No wheezing or rales.  Abdominal:     General: Bowel sounds are normal.     Palpations: Abdomen is soft.     Tenderness: There is no abdominal tenderness. There is no guarding or rebound.     Comments:  protuberant  Genitourinary:    Comments: No gross blood noted on rectal exam, patient does have stage II decubitus ulcer breakdown in her buttock and sacral region, no active bleeding at this time Musculoskeletal:        General: No tenderness or deformity.     Cervical back: Neck supple.  Skin:    General: Skin is warm and dry.     Findings: No rash.  Neurological:     General: No focal deficit present.     Cranial Nerves: No cranial nerve deficit, dysarthria or facial asymmetry.     Sensory: No sensory deficit.     Motor: No abnormal muscle tone or seizure activity.     Coordination: Coordination normal.  Psychiatric:        Mood and Affect: Mood normal.     ED Results / Procedures / Treatments   Labs (all labs ordered are listed, but only abnormal results are displayed) Labs Reviewed - No data to display  EKG None  Radiology No results found.  Procedures .Critical Care  Performed by: Linwood Dibbles, MD Authorized by: Linwood Dibbles, MD   Critical care provider statement:    Critical care time (minutes):  45   Critical care was time spent personally by me on the following activities:  Development of treatment plan with patient or surrogate, discussions with consultants, evaluation of patient's response to treatment, examination of patient, ordering and review of laboratory studies, ordering and review of radiographic studies, ordering and performing treatments and interventions, pulse oximetry, re-evaluation of patient's condition and review of old charts Procedure Name: Intubation Date/Time: 08/07/2023 5:21 PM  Performed by: Linwood Dibbles, MDPre-anesthesia Checklist: Suction available, Patient identified, Emergency Drugs available, Patient being monitored and Timeout performed Oxygen Delivery Method: Non-rebreather mask Preoxygenation: Pre-oxygenation with 100% oxygen Induction Type: Rapid sequence Laryngoscope Size: Glidescope Grade View: Grade I Tube size: 7.5 mm Number of  attempts: 1 Airway Equipment and Method: Video-laryngoscopy Placement Confirmation: ETT inserted through vocal cords under direct vision, CO2 detector and Breath sounds checked- equal and bilateral Secured at: 23 cm Tube secured with: ETT holder Dental Injury: Teeth and Oropharynx as per pre-operative assessment       {Document cardiac monitor, telemetry assessment procedure when appropriate:1}  Medications Ordered in ED Medications - No data to display  ED Course/ Medical Decision Making/ A&P Clinical Course as of 08/07/23 1720  Sat Aug 07, 2023  1637 Patient's labs are notable for hypokalemia.  Her hemoglobin is also decreased at 5.8. [JK]  1637   INR is elevated at 2.7 [JK]  1648 Patient is becoming more agitated.  She is trying to pull  lines in the bed.  She will need to be intubated for airway protection considering her persistent altered mental status [JK]  1649 Creatinine and bilirubin increasing since previous [JK]    Clinical Course User Index [JK] Linwood Dibbles, MD   {   Click here for ABCD2, HEART and other calculatorsREFRESH Note before signing :1}                              Medical Decision Making Amount and/or Complexity of Data Reviewed Labs: ordered. Radiology: ordered.  Risk Prescription drug management.   ***  {Document critical care time when appropriate:1} {Document review of labs and clinical decision tools ie heart score, Chads2Vasc2 etc:1}  {Document your independent review of radiology images, and any outside records:1} {Document your discussion with family members, caretakers, and with consultants:1} {Document social determinants of health affecting pt's care:1} {Document your decision making why or why not admission, treatments were needed:1} Final Clinical Impression(s) / ED Diagnoses Final diagnoses:  None    Rx / DC Orders ED Discharge Orders     None

## 2023-08-07 NOTE — ED Notes (Signed)
 Successfully intubated by Dr. Lynelle Doctor, RT at bedside, Rns at bedside

## 2023-08-07 NOTE — Progress Notes (Signed)
 Pharmacy Antibiotic Note  Elizabeth Bush is a 53 y.o. female admitted on 08/07/2023 with pneumonia.  Pharmacy has been consulted for Vanc/Zosyn dosing.  Plan: Vanc 2g x 1 then 1750 mg IV q48hr Zosyn 3.375g IV q8 (extended interval infusion) Daily SCr  Height: 5' 7.01" (170.2 cm) Weight: 102 kg (224 lb 13.9 oz) IBW/kg (Calculated) : 61.62  Temp (24hrs), Avg:95.8 F (35.4 C), Min:94.8 F (34.9 C), Max:97.4 F (36.3 C)  Recent Labs  Lab 08/07/23 1606 08/07/23 1618 08/07/23 1853  WBC 10.8*  --   --   CREATININE 1.72* 1.80*  --   LATICACIDVEN  --   --  9.9*    Estimated Creatinine Clearance: 43.9 mL/min (A) (by C-G formula based on SCr of 1.8 mg/dL (H)).    Allergies  Allergen Reactions   Dilaudid [Hydromorphone] Anxiety and Other (See Comments)    Patient gets paranoid and has temporary delirium    Lisinopril Swelling    Swelling of mouth/lips     Thank you for allowing pharmacy to be a part of this patient's care.  Berkley Harvey 08/07/2023 7:13 PM

## 2023-08-07 NOTE — Progress Notes (Addendum)
 eLink Physician-Brief Progress Note Patient Name: KIMBERLE STANFILL DOB: [birth year] MRN: [id number]   Date of Service  08/07/2023  HPI/Events of Note  53 year old female with history of left TKA, depression, hypertension, alcohol use.  In 2022 she had gastritis related to H. pylori followed with: [name] and Dr. Leone Payor   Results show severe metabolic acidosis with anion gap, elevated creatinine, electrolyte disturbances, elevated lactic acid and anemia to 5.8.  Receiving 2 units of PRBCs.  eICU Interventions  Initiate norepinephrine infusion  Maintain broad-spectrum antibiotics  Awaiting second unit of transfusion.  DVT prophylaxis with SCDs GI prophylaxis with PPI IV twice daily-discontinue concurrent famotidine   2315 -pH remains 7.12.  Off norepinephrine.  Still on bicarbonate infusion.  Maintain bicarb for now, no intervention.  Reassess with a.m. labs or with change in hemodynamics  2330 -add arterial line in the setting of frequent blood draws and borderline hypotension  Intervention Category Intermediate Interventions: Hypotension - evaluation and management Evaluation Type: New Patient Evaluation  Amiel Mccaffrey 08/07/2023, 9:36 PM

## 2023-08-08 ENCOUNTER — Inpatient Hospital Stay (HOSPITAL_COMMUNITY)

## 2023-08-08 ENCOUNTER — Encounter (HOSPITAL_COMMUNITY): Payer: Self-pay | Admitting: Internal Medicine

## 2023-08-08 DIAGNOSIS — E8721 Acute metabolic acidosis: Secondary | ICD-10-CM

## 2023-08-08 DIAGNOSIS — G9340 Encephalopathy, unspecified: Secondary | ICD-10-CM | POA: Diagnosis not present

## 2023-08-08 DIAGNOSIS — J189 Pneumonia, unspecified organism: Secondary | ICD-10-CM

## 2023-08-08 DIAGNOSIS — J9600 Acute respiratory failure, unspecified whether with hypoxia or hypercapnia: Secondary | ICD-10-CM | POA: Diagnosis not present

## 2023-08-08 DIAGNOSIS — K7201 Acute and subacute hepatic failure with coma: Secondary | ICD-10-CM | POA: Diagnosis not present

## 2023-08-08 DIAGNOSIS — N179 Acute kidney failure, unspecified: Secondary | ICD-10-CM | POA: Diagnosis not present

## 2023-08-08 LAB — CBC
HCT: 21.3 % — ABNORMAL LOW (ref 36.0–46.0)
HCT: 22.9 % — ABNORMAL LOW (ref 36.0–46.0)
Hemoglobin: 6.9 g/dL — CL (ref 12.0–15.0)
Hemoglobin: 8 g/dL — ABNORMAL LOW (ref 12.0–15.0)
MCH: 29.5 pg (ref 26.0–34.0)
MCH: 30.4 pg (ref 26.0–34.0)
MCHC: 32.4 g/dL (ref 30.0–36.0)
MCHC: 34.9 g/dL (ref 30.0–36.0)
MCV: 91 fL (ref 80.0–100.0)
MCV: 87.1 fL (ref 80.0–100.0)
Platelets: 170 10*3/uL (ref 150–400)
Platelets: 132 10*3/uL — ABNORMAL LOW (ref 150–400)
RBC: 2.34 MIL/uL — ABNORMAL LOW (ref 3.87–5.11)
RBC: 2.63 MIL/uL — ABNORMAL LOW (ref 3.87–5.11)
RDW: 19.5 % — ABNORMAL HIGH (ref 11.5–15.5)
RDW: 19.9 % — ABNORMAL HIGH (ref 11.5–15.5)
WBC: 15.4 10*3/uL — ABNORMAL HIGH (ref 4.0–10.5)
WBC: 16.9 10*3/uL — ABNORMAL HIGH (ref 4.0–10.5)
nRBC: 1 % — ABNORMAL HIGH (ref 0.0–0.2)
nRBC: 0.2 % (ref 0.0–0.2)

## 2023-08-08 LAB — BASIC METABOLIC PANEL
Anion gap: 20 — ABNORMAL HIGH (ref 5–15)
Anion gap: 17 — ABNORMAL HIGH (ref 5–15)
Anion gap: 16 — ABNORMAL HIGH (ref 5–15)
Anion gap: 12 (ref 5–15)
BUN: 17 mg/dL (ref 6–20)
BUN: 18 mg/dL (ref 6–20)
BUN: 16 mg/dL (ref 6–20)
BUN: 16 mg/dL (ref 6–20)
CO2: 11 mmol/L — ABNORMAL LOW (ref 22–32)
CO2: 15 mmol/L — ABNORMAL LOW (ref 22–32)
CO2: 18 mmol/L — ABNORMAL LOW (ref 22–32)
CO2: 21 mmol/L — ABNORMAL LOW (ref 22–32)
Calcium: 7.5 mg/dL — ABNORMAL LOW (ref 8.9–10.3)
Calcium: 7.9 mg/dL — ABNORMAL LOW (ref 8.9–10.3)
Calcium: 7.9 mg/dL — ABNORMAL LOW (ref 8.9–10.3)
Calcium: 7.8 mg/dL — ABNORMAL LOW (ref 8.9–10.3)
Chloride: 103 mmol/L (ref 98–111)
Chloride: 103 mmol/L (ref 98–111)
Chloride: 103 mmol/L (ref 98–111)
Chloride: 102 mmol/L (ref 98–111)
Creatinine, Ser: 2.01 mg/dL — ABNORMAL HIGH (ref 0.44–1.00)
Creatinine, Ser: 1.6 mg/dL — ABNORMAL HIGH (ref 0.44–1.00)
Creatinine, Ser: 1.75 mg/dL — ABNORMAL HIGH (ref 0.44–1.00)
Creatinine, Ser: 1.69 mg/dL — ABNORMAL HIGH (ref 0.44–1.00)
GFR, Estimated: 29 mL/min — ABNORMAL LOW (ref >60)
GFR, Estimated: 38 mL/min — ABNORMAL LOW (ref >60)
GFR, Estimated: 34 mL/min — ABNORMAL LOW (ref >60)
GFR, Estimated: 36 mL/min — ABNORMAL LOW (ref >60)
Glucose, Bld: 197 mg/dL — ABNORMAL HIGH (ref 70–99)
Glucose, Bld: 193 mg/dL — ABNORMAL HIGH (ref 70–99)
Glucose, Bld: 187 mg/dL — ABNORMAL HIGH (ref 70–99)
Glucose, Bld: 159 mg/dL — ABNORMAL HIGH (ref 70–99)
Potassium: 3.2 mmol/L — ABNORMAL LOW (ref 3.5–5.1)
Potassium: 3 mmol/L — ABNORMAL LOW (ref 3.5–5.1)
Potassium: 3.1 mmol/L — ABNORMAL LOW (ref 3.5–5.1)
Potassium: 2.7 mmol/L — CL (ref 3.5–5.1)
Sodium: 134 mmol/L — ABNORMAL LOW (ref 135–145)
Sodium: 135 mmol/L (ref 135–145)
Sodium: 137 mmol/L (ref 135–145)
Sodium: 135 mmol/L (ref 135–145)

## 2023-08-08 LAB — BLOOD GAS, ARTERIAL
Acid-Base Excess: 1.7 mmol/L (ref 0.0–2.0)
Acid-base deficit: 15.5 mmol/L — ABNORMAL HIGH (ref 0.0–2.0)
Acid-base deficit: 7.2 mmol/L — ABNORMAL HIGH (ref 0.0–2.0)
Bicarbonate: 11.6 mmol/L — ABNORMAL LOW (ref 20.0–28.0)
Bicarbonate: 17.7 mmol/L — ABNORMAL LOW (ref 20.0–28.0)
Bicarbonate: 25.8 mmol/L (ref 20.0–28.0)
Drawn by: 11249
Drawn by: 270211
Drawn by: 11249
FIO2: 60 %
FIO2: 50 %
FIO2: 40 %
MECHVT: 490 mL
MECHVT: 490 mL
MECHVT: 490 mL
O2 Saturation: 100 %
O2 Saturation: 99.4 %
O2 Saturation: 98.8 %
PEEP: 8 cmH2O
PEEP: 3 cmH2O
PEEP: 8 cmH2O
Patient temperature: 35.9
Patient temperature: 36.1
Patient temperature: 36.9
RATE: 20 {breaths}/min
RATE: 20 {breaths}/min
pCO2 arterial: 30 mmHg — ABNORMAL LOW (ref 32–48)
pCO2 arterial: 31 mmHg — ABNORMAL LOW (ref 32–48)
pCO2 arterial: 38 mmHg (ref 32–48)
pH, Arterial: 7.19 — CL (ref 7.35–7.45)
pH, Arterial: 7.36 (ref 7.35–7.45)
pH, Arterial: 7.44 (ref 7.35–7.45)
pO2, Arterial: 96 mmHg (ref 83–108)
pO2, Arterial: 116 mmHg — ABNORMAL HIGH (ref 83–108)
pO2, Arterial: 82 mmHg — ABNORMAL LOW (ref 83–108)

## 2023-08-08 LAB — LACTIC ACID, PLASMA
Lactic Acid, Venous: >9 mmol/L (ref 0.5–1.9)
Lactic Acid, Venous: 4.1 mmol/L (ref 0.5–1.9)

## 2023-08-08 LAB — URINALYSIS, ROUTINE W REFLEX MICROSCOPIC
Bilirubin Urine: NEGATIVE
Glucose, UA: NEGATIVE mg/dL
Ketones, ur: NEGATIVE mg/dL
Nitrite: NEGATIVE
Protein, ur: 30 mg/dL — AB
RBC / HPF: >50 RBC/hpf (ref 0–5)
Specific Gravity, Urine: 1.017 (ref 1.005–1.030)
WBC, UA: >50 WBC/hpf (ref 0–5)
pH: 5 (ref 5.0–8.0)

## 2023-08-08 LAB — HEPATIC FUNCTION PANEL
ALT: 28 U/L (ref 0–44)
AST: 72 U/L — ABNORMAL HIGH (ref 15–41)
Albumin: 1.9 g/dL — ABNORMAL LOW (ref 3.5–5.0)
Alkaline Phosphatase: 62 U/L (ref 38–126)
Bilirubin, Direct: 3.1 mg/dL — ABNORMAL HIGH (ref 0.0–0.2)
Indirect Bilirubin: 3.4 mg/dL — ABNORMAL HIGH (ref 0.3–0.9)
Total Bilirubin: 6.5 mg/dL — ABNORMAL HIGH (ref 0.0–1.2)
Total Protein: 5 g/dL — ABNORMAL LOW (ref 6.5–8.1)

## 2023-08-08 LAB — GLUCOSE, CAPILLARY
Glucose-Capillary: 196 mg/dL — ABNORMAL HIGH (ref 70–99)
Glucose-Capillary: 174 mg/dL — ABNORMAL HIGH (ref 70–99)
Glucose-Capillary: 185 mg/dL — ABNORMAL HIGH (ref 70–99)
Glucose-Capillary: 173 mg/dL — ABNORMAL HIGH (ref 70–99)
Glucose-Capillary: 124 mg/dL — ABNORMAL HIGH (ref 70–99)
Glucose-Capillary: 119 mg/dL — ABNORMAL HIGH (ref 70–99)

## 2023-08-08 LAB — COMPREHENSIVE METABOLIC PANEL
ALT: 29 U/L (ref 0–44)
AST: 87 U/L — ABNORMAL HIGH (ref 15–41)
Albumin: 2.1 g/dL — ABNORMAL LOW (ref 3.5–5.0)
Alkaline Phosphatase: 58 U/L (ref 38–126)
Anion gap: 17 — ABNORMAL HIGH (ref 5–15)
BUN: 16 mg/dL (ref 6–20)
CO2: 16 mmol/L — ABNORMAL LOW (ref 22–32)
Calcium: 7.9 mg/dL — ABNORMAL LOW (ref 8.9–10.3)
Chloride: 105 mmol/L (ref 98–111)
Creatinine, Ser: 1.79 mg/dL — ABNORMAL HIGH (ref 0.44–1.00)
GFR, Estimated: 33 mL/min — ABNORMAL LOW (ref >60)
Glucose, Bld: 191 mg/dL — ABNORMAL HIGH (ref 70–99)
Potassium: 3.1 mmol/L — ABNORMAL LOW (ref 3.5–5.1)
Sodium: 138 mmol/L (ref 135–145)
Total Bilirubin: 7.8 mg/dL — ABNORMAL HIGH (ref 0.0–1.2)
Total Protein: 5.2 g/dL — ABNORMAL LOW (ref 6.5–8.1)

## 2023-08-08 LAB — RENAL FUNCTION PANEL
Albumin: 2.3 g/dL — ABNORMAL LOW (ref 3.5–5.0)
Anion gap: 13 (ref 5–15)
BUN: 17 mg/dL (ref 6–20)
CO2: 20 mmol/L — ABNORMAL LOW (ref 22–32)
Calcium: 7.9 mg/dL — ABNORMAL LOW (ref 8.9–10.3)
Chloride: 103 mmol/L (ref 98–111)
Creatinine, Ser: 1.59 mg/dL — ABNORMAL HIGH (ref 0.44–1.00)
GFR, Estimated: 38 mL/min — ABNORMAL LOW (ref >60)
Glucose, Bld: 174 mg/dL — ABNORMAL HIGH (ref 70–99)
Phosphorus: 4.1 mg/dL (ref 2.5–4.6)
Potassium: 2.9 mmol/L — ABNORMAL LOW (ref 3.5–5.1)
Sodium: 136 mmol/L (ref 135–145)

## 2023-08-08 LAB — MAGNESIUM
Magnesium: 1.3 mg/dL — ABNORMAL LOW (ref 1.7–2.4)
Magnesium: 1.5 mg/dL — ABNORMAL LOW (ref 1.7–2.4)

## 2023-08-08 LAB — PROTIME-INR
INR: 2.7 — ABNORMAL HIGH (ref 0.8–1.2)
INR: 2.7 — ABNORMAL HIGH (ref 0.8–1.2)
Prothrombin Time: 28.7 s — ABNORMAL HIGH (ref 11.4–15.2)
Prothrombin Time: 28.5 s — ABNORMAL HIGH (ref 11.4–15.2)

## 2023-08-08 LAB — TRIGLYCERIDES: Triglycerides: 90 mg/dL (ref <150)

## 2023-08-08 LAB — PHOSPHORUS
Phosphorus: 5.9 mg/dL — ABNORMAL HIGH (ref 2.5–4.6)
Phosphorus: 4.8 mg/dL — ABNORMAL HIGH (ref 2.5–4.6)

## 2023-08-08 LAB — SALICYLATE LEVEL: Salicylate Lvl: <7 mg/dL — ABNORMAL LOW (ref 7.0–30.0)

## 2023-08-08 LAB — CREATININE, URINE, RANDOM: Creatinine, Urine: 125 mg/dL

## 2023-08-08 LAB — PREPARE RBC (CROSSMATCH)

## 2023-08-08 LAB — SODIUM, URINE, RANDOM: Sodium, Ur: 12 mmol/L

## 2023-08-08 LAB — ACETAMINOPHEN LEVEL: Acetaminophen (Tylenol), Serum: <10 ug/mL — ABNORMAL LOW (ref 10–30)

## 2023-08-08 LAB — PROCALCITONIN: Procalcitonin: 1.47 ng/mL

## 2023-08-08 LAB — HIV ANTIBODY (ROUTINE TESTING W REFLEX): HIV Screen 4th Generation wRfx: NONREACTIVE

## 2023-08-08 MED ORDER — ADULT MULTIVITAMIN W/MINERALS CH: 1.0000 | ORAL_TABLET | Freq: Every day | ORAL | Status: DC

## 2023-08-08 MED ORDER — POTASSIUM CHLORIDE 20 MEQ PO PACK
40.0000 meq | PACK | Freq: Once | ORAL | Status: AC
  Administered 2023-08-08: 40 meq
  Filled 2023-08-08: qty 2

## 2023-08-08 MED ORDER — MIDAZOLAM-SODIUM CHLORIDE 100-0.9 MG/100ML-% IV SOLN
0.5000 mg/h | INTRAVENOUS | Status: DC
Start: 2023-08-08 — End: 2023-08-10
  Administered 2023-08-08: 0.5 mg/h via INTRAVENOUS
  Administered 2023-08-09: 4 mg/h via INTRAVENOUS
  Filled 2023-08-08 (×2): qty 100

## 2023-08-08 MED ORDER — HEPARIN SODIUM (PORCINE) 1000 UNIT/ML DIALYSIS: 1000.0000 [IU] | INTRAMUSCULAR | Status: DC | PRN

## 2023-08-08 MED ORDER — SODIUM CHLORIDE 0.9 % IV BOLUS
500.0000 mL | Freq: Once | INTRAVENOUS | Status: AC
  Administered 2023-08-08: 500 mL via INTRAVENOUS

## 2023-08-08 MED ORDER — MAGNESIUM SULFATE 2 GM/50ML IV SOLN: 2.0000 g | Freq: Once | INTRAVENOUS | Status: DC

## 2023-08-08 MED ORDER — LACTATED RINGERS IV BOLUS
1000.0000 mL | Freq: Once | INTRAVENOUS | Status: AC
Start: 2023-08-08 — End: 2023-08-08
  Administered 2023-08-08: 1000 mL via INTRAVENOUS

## 2023-08-08 MED ORDER — LACTATED RINGERS IV BOLUS
1000.0000 mL | Freq: Once | INTRAVENOUS | Status: AC
  Administered 2023-08-08: 1000 mL via INTRAVENOUS

## 2023-08-08 MED ORDER — SODIUM CHLORIDE 0.9% IV SOLUTION: Freq: Once | INTRAVENOUS | Status: AC

## 2023-08-08 MED ORDER — MIDAZOLAM HCL 2 MG/2ML IJ SOLN
1.0000 mg | INTRAMUSCULAR | Status: DC | PRN
  Administered 2023-08-10: 2 mg via INTRAVENOUS
  Administered 2023-08-11: 4 mg via INTRAVENOUS
  Administered 2023-08-11: 2 mg via INTRAVENOUS
  Administered 2023-08-11: 4 mg via INTRAVENOUS
  Administered 2023-08-12 – 2023-08-13 (×2): 2 mg via INTRAVENOUS
  Filled 2023-08-08: qty 4
  Filled 2023-08-08: qty 2
  Filled 2023-08-08: qty 4
  Filled 2023-08-08: qty 2
  Filled 2023-08-08: qty 4
  Filled 2023-08-08: qty 2

## 2023-08-08 MED ORDER — NOREPINEPHRINE 4 MG/250ML-% IV SOLN
2.0000 ug/min | INTRAVENOUS | Status: DC
  Filled 2023-08-08 (×2): qty 250

## 2023-08-08 MED ORDER — CHLORHEXIDINE GLUCONATE CLOTH 2 % EX PADS
6.0000 | MEDICATED_PAD | Freq: Every day | CUTANEOUS | Status: DC
  Administered 2023-08-08 – 2023-08-16 (×9): 6 via TOPICAL

## 2023-08-08 MED ORDER — ALBUMIN HUMAN 25 % IV SOLN
12.5000 g | Freq: Once | INTRAVENOUS | Status: AC
  Administered 2023-08-08: 12.5 g via INTRAVENOUS
  Filled 2023-08-08: qty 50

## 2023-08-08 MED ORDER — CEFTRIAXONE SODIUM 2 G IJ SOLR
2.0000 g | INTRAMUSCULAR | Status: DC
  Administered 2023-08-08 – 2023-08-12 (×5): 2 g via INTRAVENOUS
  Filled 2023-08-08 (×5): qty 20

## 2023-08-08 MED ORDER — STERILE WATER FOR INJECTION IV SOLN
INTRAVENOUS | Status: DC
  Filled 2023-08-08: qty 150

## 2023-08-08 MED ORDER — MIDODRINE HCL 5 MG PO TABS
10.0000 mg | ORAL_TABLET | Freq: Three times a day (TID) | ORAL | Status: DC
  Administered 2023-08-08 – 2023-08-11 (×9): 10 mg
  Filled 2023-08-08 (×9): qty 2

## 2023-08-08 MED ORDER — SODIUM CHLORIDE 0.9 % IV SOLN
500.0000 mg | INTRAVENOUS | Status: DC
  Administered 2023-08-08 – 2023-08-09 (×2): 500 mg via INTRAVENOUS
  Filled 2023-08-08 (×2): qty 5

## 2023-08-08 MED ORDER — PREDNISOLONE 5 MG PO TABS
40.0000 mg | ORAL_TABLET | Freq: Every day | ORAL | Status: DC
Start: 2023-08-08 — End: 2023-08-17
  Administered 2023-08-08 – 2023-08-14 (×7): 40 mg
  Filled 2023-08-08 (×8): qty 8

## 2023-08-08 MED ORDER — SODIUM CHLORIDE 0.9 % IV SOLN: 250.0000 mL | INTRAVENOUS | Status: AC

## 2023-08-08 MED ORDER — PHENYLEPHRINE 80 MCG/ML (10ML) SYRINGE FOR IV PUSH (FOR BLOOD PRESSURE SUPPORT)
PREFILLED_SYRINGE | INTRAVENOUS | Status: AC
Start: 2023-08-08 — End: 2023-08-08
  Filled 2023-08-08: qty 10

## 2023-08-08 MED ORDER — PNEUMOCOCCAL 20-VAL CONJ VACC 0.5 ML IM SUSY
0.5000 mL | PREFILLED_SYRINGE | INTRAMUSCULAR | Status: DC | PRN
Start: 2023-08-08 — End: 2023-08-17

## 2023-08-08 MED ORDER — VASOPRESSIN 20 UNITS/100 ML INFUSION FOR SHOCK
0.0400 [IU]/min | INTRAVENOUS | Status: DC
  Administered 2023-08-08: 0.03 [IU]/min via INTRAVENOUS
  Administered 2023-08-08: 0.04 [IU]/min via INTRAVENOUS
  Filled 2023-08-08 (×2): qty 100

## 2023-08-08 MED ORDER — CALCIUM GLUCONATE-NACL 2-0.675 GM/100ML-% IV SOLN
2.0000 g | Freq: Once | INTRAVENOUS | Status: AC
  Administered 2023-08-08: 2000 mg via INTRAVENOUS
  Filled 2023-08-08: qty 100

## 2023-08-08 MED ORDER — MAGNESIUM SULFATE 2 GM/50ML IV SOLN
2.0000 g | Freq: Once | INTRAVENOUS | Status: AC
  Administered 2023-08-08: 2 g via INTRAVENOUS
  Filled 2023-08-08: qty 50

## 2023-08-08 MED ORDER — RENA-VITE PO TABS
1.0000 | ORAL_TABLET | Freq: Every day | ORAL | Status: DC
Start: 2023-08-08 — End: 2023-08-09
  Administered 2023-08-08: 1
  Filled 2023-08-08: qty 1

## 2023-08-08 MED ORDER — LACTULOSE 10 GM/15ML PO SOLN
30.0000 g | Freq: Four times a day (QID) | ORAL | Status: DC
  Administered 2023-08-08 – 2023-08-16 (×31): 30 g
  Filled 2023-08-08 (×32): qty 45

## 2023-08-08 MED ORDER — ORAL CARE MOUTH RINSE
15.0000 mL | OROMUCOSAL | Status: DC
  Administered 2023-08-08 – 2023-08-15 (×90): 15 mL via OROMUCOSAL

## 2023-08-08 MED ORDER — PRISMASOL BGK 4/2.5 32-4-2.5 MEQ/L EC SOLN: Status: DC

## 2023-08-08 MED ORDER — MAGNESIUM SULFATE 4 GM/100ML IV SOLN
4.0000 g | Freq: Once | INTRAVENOUS | Status: AC
  Administered 2023-08-08: 4 g via INTRAVENOUS
  Filled 2023-08-08: qty 100

## 2023-08-08 MED ORDER — ORAL CARE MOUTH RINSE: 15.0000 mL | OROMUCOSAL | Status: DC | PRN
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
## 2023-08-08 NOTE — Progress Notes (Signed)
 2 RT's attempted to stick patient for art line without success. CCM aware.

## 2023-08-08 NOTE — Progress Notes (Signed)
   PM rounds  S:   -Dramatic improvement since the morning.  With fluid boluses and repeated albumin boluses now is off pressors.  Making urine.  Creatinine is improved.  Lactic acid is improved from greater than 9 to greater than 4.  Acidemia pH is resolved but that is on bicarbonate infusion.  She is now having hypokalemia.  -Oxygen needs remained stable around FiO2 60%.  -Nursing indicated QTc prolonged magnesium and potassium are low.   A Septic shock -solved Acute kidney failure injury -normal indication for CRRT -Multilobar community-acquired pneumonia pneumococcus with acute lung injury -Acidemia and acidosis improving -Prolonged QTc with low potassium and low magnesium  Plan  - Remove Levophed and vasopressin off the MAR -Give another repeat albumin [likely needs schedule albumin from 08/09/2023] -MAP goal greater than 65 - Reduce bicarbonate infusion to 100 cc/h and check blood gas at 8 PM -Replete magnesium and potassium -> monitor QTc  -Closely monitor electrolytes -Continue prednisone and midodrine and other treatments including mechanical ventilation and sedation  -Probably does not need a central line and definitely no indication for CRRT   Additional 20 minutes of critical care time      SIGNATURE    Dr. Kalman Shan, M.D., F.C.C.P,  Pulmonary and Critical Care Medicine Staff Physician, [facility] Director - Interstitial Lung Disease  Program  Pulmonary Fibrosis [facility] at [facility] [place], [place], [address]   Pager: [phone], If no answer  -> Check AMION or Try [phone] Telephone (clinical office): [phone] Telephone (research): [phone]  5:33 PM 08/08/2023

## 2023-08-08 NOTE — Progress Notes (Signed)
 Patient continuing to decline with difficulty stabilizing despite multiple increasing interventions. Patient is more responsive since warming and lactulose, does have a cough reflex.
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
## 2023-08-08 NOTE — Consult Note (Signed)
 WOC Nurse Consult Note: Reason for Consult: Stage 2 pressure injury Wound type: Stage 2 pressure injury, documented in the ED per MD Pressure Injury POA: Yes Measurement: 1.5cm x 0.5cm x no depth documented by nursing staff Wound bed: clean Drainage (amount, consistency, odor) none Periwound: intact  Dressing procedure/placement/frequency: Added skin care order set for Stage 2 On low air loss while in the ICU   Re consult if needed, will not follow at this time. Thanks  Woodruff Skirvin [employer], RN,CWOCN, CNS, CWON-AP [phone])

## 2023-08-08 NOTE — Progress Notes (Signed)
 Initial Nutrition Assessment  DOCUMENTATION CODES:   Not applicable  INTERVENTION:  Renal Multivitamin w/ minerals daily Once medically appropriate, recommend starting TF via OG-tube. Start Vital 1.5 at 20 mL/hr and advance by 10 mL every 8 hours to goal rate of 50 mL/hr (1200 mL per day) 60 mL ProSource TF20 - BID Tube feeds at goal provides 1880 kcal, 121 gm protein, and 917 mL free water daily.   NUTRITION DIAGNOSIS:   Inadequate oral intake related to inability to eat as evidenced by NPO status.  GOAL:   Patient will meet greater than or equal to 90% of their needs  MONITOR:   Vent status, TF tolerance, Labs, Weight trends  REASON FOR ASSESSMENT:   Consult, Ventilator Enteral/tube feeding initiation and management  ASSESSMENT:   53 y.o. female presented ot the ED with increased confusion and ongoing bleeding since previous admission during February. PMH includes GERD, depression, HTN, EtOH abuse, and anemia. Pt admitted with acute respiratory failure 2/2 PNA, acute encephalopathy, and concern for sepsis.   3/22 - Admitted; Intubated  RD working remotely at time of assessment. Discussed with RN, plan to hold off on TF at this time. Plan to start CRRT.   Per EMR, pt with a 6.3% weight loss within the past 3.5 months.   Patient is currently intubated on ventilator support. MV: 10.7 L/min MAP (cuff): 68 Temp (24hrs), Avg:96 F (35.6 C), Min:93.6 F (34.2 C), Max:97.4 F (36.3 C)  Drips Fentanyl Levophed Propofol: 9.18 ml/hr (242.4 kcal/day) Sodium Bicarbonate Vasopressin  Medications reviewed and include: NovoLog SSI, Lactulose, Protonix, IV antibiotics Labs reviewed: Sodium 134, Potassium 3.2, BUN 17, Creatinine 2.01, Phosphorus 5.9, Magnesium 1.3  CBG: 104-196 x 24 hrs  NUTRITION - FOCUSED PHYSICAL EXAM:  Deferred to follow-up.   Diet Order:   Diet Order             Diet NPO time specified  Diet effective now                   EDUCATION  NEEDS:  Not appropriate for education at this time  Skin:  Skin Assessment: Skin Integrity Issues: Skin Integrity Issues:: Stage II Stage II: R Buttocks  Last BM:  PTA  Height:  Ht Readings from Last 1 Encounters:  08/07/23 5' 7.01" (1.702 m)   Weight:  Wt Readings from Last 1 Encounters:  08/07/23 102 kg   Ideal Body Weight:  61.4 kg  BMI:  Body mass index is 35.21 kg/m.  Estimated Nutritional Needs:  Kcal:  1900-2100 Protein:  90-110 grams Fluid:  >/= 1.9 L   Kirby Crigler RD, LDN Clinical Dietitian
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
## 2023-08-08 NOTE — Plan of Care (Signed)
  Problem: Skin Integrity: Goal: Risk for impaired skin integrity will decrease Outcome: Progressing   Problem: Coping: Goal: Ability to adjust to condition or change in health will improve Outcome: Not Progressing   Problem: Fluid Volume: Goal: Ability to maintain a balanced intake and output will improve Outcome: Not Progressing   Problem: Tissue Perfusion: Goal: Adequacy of tissue perfusion will improve Outcome: Not Progressing

## 2023-08-09 ENCOUNTER — Inpatient Hospital Stay (HOSPITAL_COMMUNITY)

## 2023-08-09 ENCOUNTER — Other Ambulatory Visit: Payer: Self-pay

## 2023-08-09 DIAGNOSIS — N179 Acute kidney failure, unspecified: Secondary | ICD-10-CM | POA: Diagnosis not present

## 2023-08-09 DIAGNOSIS — K7682 Hepatic encephalopathy: Secondary | ICD-10-CM

## 2023-08-09 DIAGNOSIS — K7031 Alcoholic cirrhosis of liver with ascites: Secondary | ICD-10-CM

## 2023-08-09 DIAGNOSIS — K7290 Hepatic failure, unspecified without coma: Secondary | ICD-10-CM | POA: Diagnosis not present

## 2023-08-09 DIAGNOSIS — J9600 Acute respiratory failure, unspecified whether with hypoxia or hypercapnia: Secondary | ICD-10-CM | POA: Diagnosis not present

## 2023-08-09 DIAGNOSIS — K625 Hemorrhage of anus and rectum: Secondary | ICD-10-CM

## 2023-08-09 DIAGNOSIS — E872 Acidosis, unspecified: Secondary | ICD-10-CM

## 2023-08-09 DIAGNOSIS — R579 Shock, unspecified: Secondary | ICD-10-CM | POA: Diagnosis not present

## 2023-08-09 DIAGNOSIS — J9601 Acute respiratory failure with hypoxia: Secondary | ICD-10-CM

## 2023-08-09 DIAGNOSIS — J189 Pneumonia, unspecified organism: Secondary | ICD-10-CM | POA: Diagnosis not present

## 2023-08-09 DIAGNOSIS — D500 Iron deficiency anemia secondary to blood loss (chronic): Secondary | ICD-10-CM

## 2023-08-09 DIAGNOSIS — R6521 Severe sepsis with septic shock: Secondary | ICD-10-CM | POA: Diagnosis not present

## 2023-08-09 DIAGNOSIS — L8990 Pressure ulcer of unspecified site, unspecified stage: Secondary | ICD-10-CM | POA: Insufficient documentation

## 2023-08-09 LAB — BPAM RBC
Blood Product Expiration Date: 202504142359
Blood Product Expiration Date: 202504142359
Blood Product Expiration Date: 202504142359
ISSUE DATE / TIME: 202503230805
ISSUE DATE / TIME: 202503221750
ISSUE DATE / TIME: 202503222244
Unit Type and Rh: 7300
Unit Type and Rh: 7300
Unit Type and Rh: 7300

## 2023-08-09 LAB — BASIC METABOLIC PANEL
Anion gap: 10 (ref 5–15)
Anion gap: 11 (ref 5–15)
BUN: 17 mg/dL (ref 6–20)
BUN: 17 mg/dL (ref 6–20)
CO2: 23 mmol/L (ref 22–32)
CO2: 24 mmol/L (ref 22–32)
Calcium: 8.1 mg/dL — ABNORMAL LOW (ref 8.9–10.3)
Calcium: 8.1 mg/dL — ABNORMAL LOW (ref 8.9–10.3)
Chloride: 102 mmol/L (ref 98–111)
Chloride: 104 mmol/L (ref 98–111)
Creatinine, Ser: 1.78 mg/dL — ABNORMAL HIGH (ref 0.44–1.00)
Creatinine, Ser: 1.84 mg/dL — ABNORMAL HIGH (ref 0.44–1.00)
GFR, Estimated: 32 mL/min — ABNORMAL LOW (ref >60)
GFR, Estimated: 34 mL/min — ABNORMAL LOW (ref >60)
Glucose, Bld: 131 mg/dL — ABNORMAL HIGH (ref 70–99)
Glucose, Bld: 136 mg/dL — ABNORMAL HIGH (ref 70–99)
Potassium: 3.2 mmol/L — ABNORMAL LOW (ref 3.5–5.1)
Potassium: 3.3 mmol/L — ABNORMAL LOW (ref 3.5–5.1)
Sodium: 136 mmol/L (ref 135–145)
Sodium: 138 mmol/L (ref 135–145)

## 2023-08-09 LAB — BPAM FFP
Blood Product Expiration Date: 202503231747
ISSUE DATE / TIME: 202503230129
Unit Type and Rh: 202503231747
Unit Type and Rh: 7300

## 2023-08-09 LAB — LIPASE, BLOOD: Lipase: 94 U/L — ABNORMAL HIGH (ref 11–51)

## 2023-08-09 LAB — CBC
HCT: 20.2 % — ABNORMAL LOW (ref 36.0–46.0)
Hemoglobin: 7.2 g/dL — ABNORMAL LOW (ref 12.0–15.0)
MCH: 30.8 pg (ref 26.0–34.0)
MCHC: 35.6 g/dL (ref 30.0–36.0)
MCV: 86.3 fL (ref 80.0–100.0)
Platelets: 132 10*3/uL — ABNORMAL LOW (ref 150–400)
RBC: 2.34 MIL/uL — ABNORMAL LOW (ref 3.87–5.11)
RDW: 19.6 % — ABNORMAL HIGH (ref 11.5–15.5)
WBC: 16.2 10*3/uL — ABNORMAL HIGH (ref 4.0–10.5)
nRBC: 0.4 % — ABNORMAL HIGH (ref 0.0–0.2)

## 2023-08-09 LAB — TYPE AND SCREEN
ABO/RH(D): B POS
Antibody Screen: NEGATIVE
Unit division: 0
Unit division: 0
Unit division: 0

## 2023-08-09 LAB — DIC (DISSEMINATED INTRAVASCULAR COAGULATION)PANEL
D-Dimer, Quant: 5.69 ug{FEU}/mL — ABNORMAL HIGH (ref 0.00–0.50)
Fibrinogen: 142 mg/dL — ABNORMAL LOW (ref 210–475)
INR: 2.4 — ABNORMAL HIGH (ref 0.8–1.2)
Platelets: 146 10*3/uL — ABNORMAL LOW (ref 150–400)
Prothrombin Time: 26.6 s — ABNORMAL HIGH (ref 11.4–15.2)
Smear Review: NONE SEEN
aPTT: 47 s — ABNORMAL HIGH (ref 24–36)

## 2023-08-09 LAB — GLUCOSE, CAPILLARY
Glucose-Capillary: 103 mg/dL — ABNORMAL HIGH (ref 70–99)
Glucose-Capillary: 109 mg/dL — ABNORMAL HIGH (ref 70–99)
Glucose-Capillary: 116 mg/dL — ABNORMAL HIGH (ref 70–99)
Glucose-Capillary: 125 mg/dL — ABNORMAL HIGH (ref 70–99)
Glucose-Capillary: 126 mg/dL — ABNORMAL HIGH (ref 70–99)
Glucose-Capillary: 126 mg/dL — ABNORMAL HIGH (ref 70–99)
Glucose-Capillary: 137 mg/dL — ABNORMAL HIGH (ref 70–99)

## 2023-08-09 LAB — CREATININE, URINE, RANDOM: Creatinine, Urine: 55 mg/dL

## 2023-08-09 LAB — HEPATIC FUNCTION PANEL
ALT: 25 U/L (ref 0–44)
AST: 61 U/L — ABNORMAL HIGH (ref 15–41)
Albumin: 2.2 g/dL — ABNORMAL LOW (ref 3.5–5.0)
Alkaline Phosphatase: 60 U/L (ref 38–126)
Bilirubin, Direct: 2.9 mg/dL — ABNORMAL HIGH (ref 0.0–0.2)
Indirect Bilirubin: 3.3 mg/dL — ABNORMAL HIGH (ref 0.3–0.9)
Total Bilirubin: 6.2 mg/dL — ABNORMAL HIGH (ref 0.0–1.2)
Total Protein: 5 g/dL — ABNORMAL LOW (ref 6.5–8.1)

## 2023-08-09 LAB — PROTIME-INR
INR: 2.6 — ABNORMAL HIGH (ref 0.8–1.2)
Prothrombin Time: 27.9 s — ABNORMAL HIGH (ref 11.4–15.2)

## 2023-08-09 LAB — AMYLASE: Amylase: 128 U/L — ABNORMAL HIGH (ref 28–100)

## 2023-08-09 LAB — PHOSPHORUS: Phosphorus: 4 mg/dL (ref 2.5–4.6)

## 2023-08-09 LAB — PREPARE FRESH FROZEN PLASMA

## 2023-08-09 LAB — MAGNESIUM
Magnesium: 2 mg/dL (ref 1.7–2.4)
Magnesium: 2 mg/dL (ref 1.7–2.4)

## 2023-08-09 LAB — AMMONIA: Ammonia: 30 umol/L (ref 9–35)

## 2023-08-09 LAB — LEGIONELLA PNEUMOPHILA SEROGP 1 UR AG: L. pneumophila Serogp 1 Ur Ag: NEGATIVE

## 2023-08-09 MED ORDER — VITAL 1.5 CAL PO LIQD
1000.0000 mL | ORAL | Status: DC
  Administered 2023-08-09: 1000 mL
  Filled 2023-08-09 (×2): qty 1000

## 2023-08-09 MED ORDER — SODIUM CHLORIDE 0.9 % IV SOLN
100.0000 mg | Freq: Two times a day (BID) | INTRAVENOUS | Status: AC
  Administered 2023-08-10 – 2023-08-12 (×6): 100 mg via INTRAVENOUS
  Filled 2023-08-09 (×6): qty 100

## 2023-08-09 MED ORDER — SODIUM CHLORIDE 0.9% FLUSH: 10.0000 mL | INTRAVENOUS | Status: DC | PRN

## 2023-08-09 MED ORDER — SODIUM CHLORIDE 0.9% IV SOLUTION: Freq: Once | INTRAVENOUS | Status: AC

## 2023-08-09 MED ORDER — POTASSIUM CHLORIDE 20 MEQ PO PACK
20.0000 meq | PACK | ORAL | Status: AC
  Administered 2023-08-09 (×2): 20 meq
  Filled 2023-08-09 (×2): qty 1

## 2023-08-09 MED ORDER — VITAMIN K1 10 MG/ML IJ SOLN
10.0000 mg | Freq: Once | INTRAVENOUS | Status: AC
  Administered 2023-08-09: 10 mg via INTRAVENOUS
  Filled 2023-08-09: qty 1

## 2023-08-09 MED ORDER — POTASSIUM CHLORIDE 10 MEQ/100ML IV SOLN
10.0000 meq | INTRAVENOUS | Status: AC
  Administered 2023-08-09 (×4): 10 meq via INTRAVENOUS
  Filled 2023-08-09 (×4): qty 100

## 2023-08-09 MED ORDER — FUROSEMIDE 10 MG/ML IJ SOLN
120.0000 mg | Freq: Once | INTRAVENOUS | Status: AC
  Administered 2023-08-09: 120 mg via INTRAVENOUS
  Filled 2023-08-09: qty 10

## 2023-08-09 MED ORDER — SODIUM CHLORIDE 0.9% FLUSH
10.0000 mL | Freq: Two times a day (BID) | INTRAVENOUS | Status: DC
  Administered 2023-08-09: 40 mL
  Administered 2023-08-10 (×2): 10 mL
  Administered 2023-08-11: 30 mL
  Administered 2023-08-11 – 2023-08-12 (×2): 20 mL
  Administered 2023-08-12: 10 mL
  Administered 2023-08-13: 20 mL
  Administered 2023-08-13: 10 mL
  Administered 2023-08-14 – 2023-08-15 (×4): 20 mL
  Administered 2023-08-16: 10 mL
  Administered 2023-08-16: 40 mL

## 2023-08-09 NOTE — Progress Notes (Signed)
 [facility] ADULT ICU REPLACEMENT PROTOCOL   The patient does apply for the [facility] Adult ICU Electrolyte Replacment Protocol based on the criteria listed below:   1.Exclusion criteria: TCTS, ECMO, Dialysis, and Myasthenia Gravis patients 2. Is GFR >/= 30 ml/min? Yes.    Patient's GFR today is 34 3. Is SCr </= 2? Yes.   Patient's SCr is 1.78 mg/dL 4. Did SCr increase >/= 0.5 in 24 hours? No. 5.Pt's weight >40kg  Yes.   6. Abnormal electrolyte(s): K  7. Electrolytes replaced per protocol 8.  Call MD STAT for K+ </= 2.5, Phos </= 1, or Mag </= 1 Physician:  Thersa Salt Sheridyn Canino 08/09/2023 12:52 AM
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
## 2023-08-09 NOTE — Plan of Care (Signed)
  Problem: Metabolic: Goal: Ability to maintain appropriate glucose levels will improve Outcome: Progressing   Problem: Skin Integrity: Goal: Risk for impaired skin integrity will decrease Outcome: Progressing   Problem: Tissue Perfusion: Goal: Adequacy of tissue perfusion will improve Outcome: Progressing   Problem: Clinical Measurements: Goal: Cardiovascular complication will be avoided Outcome: Progressing   Problem: Pain Managment: Goal: General experience of comfort will improve and/or be controlled Outcome: Progressing   Problem: Safety: Goal: Ability to remain free from injury will improve Outcome: Progressing   Problem: Nutritional: Goal: Maintenance of adequate nutrition will improve Outcome: Not Progressing   Problem: Health Behavior/Discharge Planning: Goal: Ability to manage health-related needs will improve Outcome: Not Progressing   Problem: Nutrition: Goal: Adequate nutrition will be maintained Outcome: Not Progressing   Problem: Elimination: Goal: Will not experience complications related to urinary retention Outcome: Not Progressing
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
## 2023-08-09 NOTE — Plan of Care (Signed)
  Problem: Health Behavior/Discharge Planning: Goal: Ability to manage health-related needs will improve Outcome: Progressing   Problem: Clinical Measurements: Goal: Diagnostic test results will improve Outcome: Progressing Goal: Respiratory complications will improve Outcome: Progressing   Problem: Coping: Goal: Level of anxiety will decrease Outcome: Progressing   Problem: Safety: Goal: Ability to remain free from injury will improve Outcome: Progressing

## 2023-08-09 NOTE — Progress Notes (Signed)
 Nutrition Follow-up  DOCUMENTATION CODES:   Not applicable  INTERVENTION:  - Per CCM, can initiate trickle tube feeds today: Vital 1.5 @ 20mL/hr  - Monitor magnesium, potassium, and phosphorus BID for at least 3 days, MD to replete as needed, as pt may be at risk for refeeding syndrome.  - FWF per CCM /MD.  - Once able to advance past trickles, recommend: Vital 1.5 at 50 ml/h (1200 ml per day) *Would recommend increasing by 10mL Q8H Prosource TF20 60 ml daily Provides 1880 kcal, 101 gm protein, 917 ml free water daily   NUTRITION DIAGNOSIS:   Inadequate oral intake related to inability to eat as evidenced by NPO status. *ongoing  GOAL:   Patient will meet greater than or equal to 90% of their needs *progressing, starting TF  MONITOR:   Vent status, TF tolerance, Labs, Weight trends  REASON FOR ASSESSMENT:   Consult, Ventilator Enteral/tube feeding initiation and management  ASSESSMENT:   53 y.o. female presented ot the ED with increased confusion and ongoing bleeding since previous admission during February. PMH includes GERD, depression, HTN, EtOH abuse, and anemia. Pt admitted with acute respiratory failure 2/2 PNA, acute encephalopathy, and concern for sepsis.  3/22 - Admitted; Intubated   Patient remains intubated on ventilator support MV: 9.7 L/min Temp (24hrs), Avg:97.2 F (36.2 C), Min:95.7 F (35.4 C), Max:99 F (37.2 C)  No family at bedside at time of visit. Patient was initially thought to have needed CRRT yesterday but this was cancelled after patient improved with fluid boluses and repeated albumin boluses. Will discontinue Rena-vit now that no plans for CRRT. She is now off vasopressors.  GI notes indicate low suspicion for GI bleed at this time. No plan for repeat endoscopy.   Per discussion with CCM attending Dr. Vassie Loll, can initiate trickle tube feeds today. Discussed with RN.    Admit weight: 224# Current weight: 277# I&O's: +11.9L + for  deep pitting generalized edema  Medications reviewed and include: Rena-vit Fentanyl  Labs reviewed:  K+ 3.3 Creatinine 1.84   NUTRITION - FOCUSED PHYSICAL EXAM:  Flowsheet Row Most Recent Value  Orbital Region No depletion  Upper Arm Region No depletion  Thoracic and Lumbar Region No depletion  Buccal Region No depletion  Temple Region No depletion  Clavicle Bone Region No depletion  Clavicle and Acromion Bone Region No depletion  Scapular Bone Region Unable to assess  Dorsal Hand No depletion  Patellar Region No depletion  Anterior Thigh Region No depletion  Posterior Calf Region No depletion  Edema (RD Assessment) Moderate  Hair Reviewed  Eyes Unable to assess  Mouth Unable to assess  Skin Reviewed  Nails Reviewed       Diet Order:   Diet Order             Diet NPO time specified  Diet effective now                   EDUCATION NEEDS:  Not appropriate for education at this time  Skin:  Skin Assessment: Skin Integrity Issues: Skin Integrity Issues:: Stage II Stage II: R Buttocks  Last BM:  3/24 - rectal tube  Height:  Ht Readings from Last 1 Encounters:  08/07/23 5' 7.01" (1.702 m)   Weight:  Wt Readings from Last 1 Encounters:  08/09/23 125.9 kg   Ideal Body Weight:  61.4 kg  BMI:  Body mass index is 43.46 kg/m.  Estimated Nutritional Needs:  Kcal:  1850-2100 kcals ([employer]:  1890 kcals) Protein:  100-125 grams Fluid:  >/= 1.9L    Shelle Iron RD, LDN Contact via Secure Chat.

## 2023-08-09 NOTE — Progress Notes (Signed)
 eLink Physician-Brief Progress Note Patient Name: Elizabeth Bush DOB: 01-30-1971 MRN: [id number]   Date of Service  08/09/2023  HPI/Events of Note  pH improving, hemodynamics markedly better  eICU Interventions  Reduce bicarb to 50, end in 5 hours     Intervention Category Major Interventions: Shock - evaluation and management  Stiven Kaspar 08/09/2023, 5:26 AM

## 2023-08-09 NOTE — Progress Notes (Signed)
 [facility] Progress Note  CC:  ETOH liver disease, HE  Subjective:  Minimal urine per his nurse.  No black or bloody stools in flexiseal bag, brown stool.  Objective:  Vital signs in last 24 hours: Temp:  [96.3 F (35.7 C)-99 F (37.2 C)] 98.2 F (36.8 C) (03/24 0830) Pulse Rate:  [81-92] 83 (03/24 0830) Resp:  [13-33] 20 (03/24 0830) BP: (102-139)/(49-82) 109/57 (03/24 0830) SpO2:  [96 %-100 %] 98 % (03/24 0830) FiO2 (%):  [40 %-50 %] 40 % (03/24 0830) Weight:  [125.9 kg] 125.9 kg (03/24 0500) Last BM Date : 08/08/23 (flexiseal in place) General:  Intubated/sedated. Heart:  Regular rate and rhythm; no murmurs Abdomen:  Soft, slightly distended.  BS present.  Intake/Output from previous day: 03/23 0701 - 03/24 0700 In: 8486.9 [I.V.:3567.2; Blood:297; NG/GT:380; IV Piggyback:4032.7] Out: 910 [Urine:270; Emesis/NG output:550; Stool:90] Intake/Output this shift: Total I/O In: 150 [NG/GT:150] Out: 50 [Urine:50]  Lab Results: Recent Labs    08/08/23 0527 08/08/23 1611 08/09/23 0307 08/09/23 0308  WBC 15.4* 16.9* 16.2*  --   HGB 6.9* 8.0* 7.2*  --   HCT 21.3* 22.9* 20.2*  --   PLT 170 132* 132* 146*   BMET Recent Labs    08/08/23 1757 08/09/23 0002 08/09/23 0307  NA 135 136 138  K 2.7* 3.2* 3.3*  CL 102 102 104  CO2 21* 23 24  GLUCOSE 159* 136* 131*  BUN 16 17 17   CREATININE 1.69* 1.78* 1.84*  CALCIUM 7.8* 8.1* 8.1*   LFT Recent Labs    08/09/23 0307  PROT 5.0*  ALBUMIN 2.2*  AST 61*  ALT 25  ALKPHOS 60  BILITOT 6.2*  BILIDIR 2.9*  IBILI 3.3*   PT/INR Recent Labs    08/09/23 0307 08/09/23 0308  LABPROT 27.9* 26.6*  INR 2.6* 2.4*   Hepatitis Panel No results for input(s): "HEPBSAG", "HCVAB", "HEPAIGM", "HEPBIGM" in the last 72 hours.  DG CHEST PORT 1 VIEW Result Date: 08/09/2023 CLINICAL DATA:  [id number].  Endotracheal tube present. EXAM: PORTABLE CHEST 1 VIEW COMPARISON:  Portable chest yesterday at 5:51 p.m. FINDINGS:  4:16 a.m. ETT is not seen on today's exam and has either been removed or pulled back above the plane of the study. NGT is well placed terminating in the distal body of stomach. Stable cardiomegaly and central vascular fullness. Patchy dense opacities of the left-greater-than-right lungs demonstrate no interval improvement or worsening. There is a small underlying left pleural effusion. The mediastinum is stable. No new osseous findings. There is a tangle of overlying monitor wires. IMPRESSION: 1. ETT is not seen on today's exam and has either been removed or pulled back above the plane of the study. 2. NGT is well placed terminating in the distal body of stomach. 3. Stable cardiomegaly and central vascular fullness. 4. Patchy dense opacities of the left-greater-than-right lungs demonstrate no interval improvement or worsening. Small left pleural effusion. Electronically Signed   By: Almira Bar M.D.   On: 08/09/2023 05:54   DG CHEST PORT 1 VIEW Result Date: 08/08/2023 CLINICAL DATA:  Check endotracheal tube placement EXAM: PORTABLE CHEST 1 VIEW COMPARISON:  08/07/2023 FINDINGS: Endotracheal tube and gastric catheter are noted in satisfactory position. Cardiac shadow is stable. Patchy bilateral airspace opacities are again identified stable from the prior exam. No new focal abnormality is noted. IMPRESSION: No significant change from the previous day. Electronically Signed   By: Alcide Clever M.D.   On: 08/08/2023 20:02   US  RENAL Result Date: 08/08/2023 CLINICAL DATA:  Acute kidney injury EXAM: RENAL / URINARY TRACT ULTRASOUND COMPLETE COMPARISON:  06/20/2023 CT scan FINDINGS: Right Kidney: Renal measurements: 10.3 by 5.0 by 4.7 cm = volume: 125 mL. Echogenicity within normal limits. No mass or hydronephrosis visualized. The right kidney upper pole cyst shown on the CT from 06/20/2023 was not well characterized. Left Kidney: Renal measurements: 9.9 by 4.9 by 5.3 cm = volume: 133 mL. Echogenicity within  normal limits. No mass or hydronephrosis visualized. A 1.5 by 1.5 by 1.8 cm upper pole cyst is present. No further imaging workup of this lesion is indicated. Bladder: Appears normal for degree of bladder distention. Foley catheter in the urinary bladder. Other: Substantial ascites noted along with left pleural effusion. Limitations: Body habitus, overlying bowel gas, patient unable to assist fully with positioning. IMPRESSION: 1. No hydronephrosis. 2. Substantial ascites and left pleural effusion. 3. Foley catheter in the urinary bladder. Electronically Signed   By: Gaylyn Rong M.D.   On: 08/08/2023 14:40   DG Abd Portable 1V Result Date: 08/07/2023 CLINICAL DATA:  OG tube placement EXAM: PORTABLE ABDOMEN - 1 VIEW COMPARISON:  08/07/2023 FINDINGS: Enteric tube tip and side port overlie the proximal stomach. Upper gas pattern is unremarkable. IMPRESSION: Enteric tube tip and side port overlie the proximal stomach. Electronically Signed   By: Jasmine Pang M.D.   On: 08/07/2023 20:30   DG CHEST PORT 1 VIEW Result Date: 08/07/2023 CLINICAL DATA:  Intubated EXAM: PORTABLE CHEST 1 VIEW COMPARISON:  08/07/2023 FINDINGS: Endotracheal tube tip is about 4.6 cm superior to the carina. Enteric tube tip below the diaphragm but incompletely assessed. Cardiac enlargement. No significant change in heterogeneous bilateral perihilar consolidations. IMPRESSION: Endotracheal tube tip about 4.6 cm superior to the carina. No significant change in heterogeneous bilateral perihilar consolidations. Electronically Signed   By: Jasmine Pang M.D.   On: 08/07/2023 20:29   US Abdomen Limited Result Date: 08/07/2023 CLINICAL DATA:  Cirrhosis EXAM: ULTRASOUND ABDOMEN LIMITED RIGHT UPPER QUADRANT COMPARISON:  CT 06/20/2023 FINDINGS: Gallbladder: No shadowing stones. Upper normal wall thickness. Negative sonographic Murphy's. Common bile duct: Diameter: 4 mm Liver: Diffuse increased echogenicity limits assessment for parenchymal  abnormality. Mild surface nodularity corresponding to history of cirrhosis. Portal vein is poorly assessed due to habitus and marked fatty infiltration of the liver with poorly visible portal vessels. Other: Small to moderate volume ascites in the right upper quadrant IMPRESSION: 1. Liver cirrhosis with marked echogenic liver parenchyma/fatty infiltration. Unable to document patency of portal vein on this exam, portal vessel poorly visible due to reasons discussed above. 2. Small moderate volume ascites in the right upper quadrant Electronically Signed   By: Jasmine Pang M.D.   On: 08/07/2023 20:28   CT Head Wo Contrast Result Date: 08/07/2023 CLINICAL DATA:  Answered mental status EXAM: CT HEAD WITHOUT CONTRAST TECHNIQUE: Contiguous axial images were obtained from the base of the skull through the vertex without intravenous contrast. RADIATION DOSE REDUCTION: This exam was performed according to the departmental dose-optimization program which includes automated exposure control, adjustment of the mA and/or kV according to patient size and/or use of iterative reconstruction technique. COMPARISON:  CT brain 09/10/2021 FINDINGS: Brain: No evidence of acute infarction, hemorrhage, hydrocephalus, extra-axial collection or mass lesion/mass effect. Vascular: No hyperdense vessel or unexpected calcification. Skull: Normal. Negative for fracture or focal lesion. Sinuses/Orbits: No acute finding. Other: None. IMPRESSION: Negative non contrasted CT appearance of the brain. Electronically Signed   By: Adrian Prows.D.  On: 08/07/2023 18:48   DG Abd Portable 1V Result Date: 08/07/2023 CLINICAL DATA:  Intubated EXAM: PORTABLE ABDOMEN - 1 VIEW COMPARISON:  None Available. FINDINGS: Enteric tube tip near the GE junction, side-port in the region of distal esophagus. Upper gas pattern is unremarkable IMPRESSION: Enteric tube tip near the GE junction, side-port in the region of distal esophagus. Recommend advancement by  about 10 cm for more optimal positioning Electronically Signed   By: Jasmine Pang M.D.   On: 08/07/2023 18:46   DG Chest Portable 1 View Result Date: 08/07/2023 CLINICAL DATA:  Altered consciousness.  Status post intubation EXAM: PORTABLE CHEST 1 VIEW COMPARISON:  07/24/2020 FINDINGS: Endotracheal tube terminates at or just above the carina and should be retracted 2.5-3 cm. Nasogastric tube extends beyond the inferior aspect of the film. Patient rotated to the left. Heart size not well evaluated. No pleural effusion or pneumothorax. Lung volumes are low. Bilateral, right greater than left perihilar and upper lobe airspace disease. IMPRESSION: Endotracheal tube terminates added just above the carina and should be retracted 2.5-3 cm. Development of bilateral perihilar and upper lobe airspace disease, with pattern favoring alveolar edema. Multifocal infection or aspiration could look similar. Electronically Signed   By: Jeronimo Greaves M.D.   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 Did not have signs of portal htn on EGD 06/2023 (NL).  INR 2.6, platelets 132.  MELD 3.0: 31 at 08/09/2023  3:08 AM MELD-Na: 29 at 08/09/2023  3:08 AM Calculated from: Serum Creatinine: 1.84 mg/dL at 1/61/0960  4:54 AM Serum Sodium: 138 mmol/L (Using max of 137 mmol/L) at 08/09/2023  3:07 AM Total Bilirubin: 6.2 mg/dL at 0/98/1191  4:78 AM Serum Albumin: 2.2 g/dL at 2/95/6213  0:86 AM INR(ratio): 2.4 at 08/09/2023  3:08 AM Age at listing (hypothetical): 53 years Sex: Female at 08/09/2023  3:08 AM   Maddrey's DF on admit was 78.9.  2) Sepsis/Shock, Pneumococcal pneumonia possible ARDS   3) Acute kidney injury - ATN from shock and/or hepatorenal syndrome.  Receiving albumin and midodrine.  Nephrology is on board.   4) Severe metabolic acidosis, parameters improving.   5) Chronic rectal bleeding from hemorrhoids and chronic anemia, w/  severe anemia +decreased Hgb this admission.  Has received 3 units of packed red blood cells this admission.  Hemoglobin 7.2 g this morning.   The patient is critically ill - agree w/ care as per CCM, nephrology. Prognosis poor, but has had some improvement.  Is off of pressors and making some urine, although minimal.   Is on prednisone 40 mg daily.   Lactulose for HE, currently on 30 g 4 times daily.   Blood product support, serial coags.  ?  Trial of IV vitamin K x 3 days to see if that helps with coagulopathy.   Hold off on paracentesis given amount seen on [place] and overall situation (on broad spectrum Abx already).   LOS: 2 days   Princella Pellegrini. Sholom Dulude  08/09/2023, 9:29 AM

## 2023-08-09 NOTE — Progress Notes (Signed)
 Peripherally Inserted Central Catheter Placement  The IV Nurse has discussed with the patient and/or persons authorized to consent for the patient, the purpose of this procedure and the potential benefits and risks involved with this procedure.  The benefits include less needle sticks, lab draws from the catheter, and the patient may be discharged home with the catheter. Risks include, but not limited to, infection, bleeding, blood clot (thrombus formation), and puncture of an artery; nerve damage and irregular heartbeat and possibility to perform a PICC exchange if needed/ordered by physician.  Alternatives to this procedure were also discussed.  Bard Power PICC patient education guide, fact sheet on infection prevention and patient information card has been provided to patient /or left at bedside. RN obtained telephone consent via daughter.   PICC Placement Documentation  PICC Double Lumen 08/09/23 Left Cephalic 42 cm 0 cm (Active)  Indication for Insertion or Continuance of Line Vasoactive infusions;Limited venous access - need for IV therapy >5 days (PICC only) 08/09/23 1725  Exposed Catheter (cm) 0 cm 08/09/23 1725  Site Assessment Clean, Dry, Intact 08/09/23 1725  Lumen #1 Status Flushed;Saline locked;Blood return noted 08/09/23 1725  Lumen #2 Status Flushed;Saline locked;Blood return noted 08/09/23 1725  Dressing Type Transparent;Securing device 08/09/23 1725  Dressing Status Antimicrobial disc/dressing in place 08/09/23 1725  Line Care Connections checked and tightened 08/09/23 1725  Line Adjustment (NICU/IV Team Only) No 08/09/23 1725  Dressing Intervention New dressing;Adhesive placed at insertion site (IV team only) 08/09/23 1725  Dressing Change Due 08/16/23 08/09/23 1725       Vernona Rieger  Lea Walbert 08/09/2023, 5:27 PM

## 2023-08-09 NOTE — Progress Notes (Signed)
 Patient ID: Elizabeth Bush, female   DOB: [birth year], 53 y.o.   MRN: [id number] S: She received 3 units of PRBC's yesterday and clinically improved overnight without starting CRRT.  Currently off of pressors. O:BP 121/70   Pulse 74   Temp (!) 96.1 F (35.6 C)   Resp 20   Ht 5' 7.01" (1.702 m)   Wt 125.9 kg   LMP  (LMP Unknown)   SpO2 98%   BMI 43.46 kg/m   Intake/Output Summary (Last 24 hours) at 08/09/2023 1248 Last data filed at 08/09/2023 1200 Gross per 24 hour  Intake 4191.5 ml  Output 1540 ml  Net 2651.5 ml   Intake/Output: I/O last 3 completed shifts: In: 78295 [I.V.:5284.7; Blood:1438.8; Other:210; NG/GT:380; IV Piggyback:5146.5] Out: 955 [Urine:315; Emesis/NG output:550; Stool:90]  Intake/Output this shift:  Total I/O In: 1173.8 [I.V.:565.1; NG/GT:150; IV Piggyback:458.8] Out: 720 [Urine:175; Emesis/NG output:450; Stool:95] Weight change: 13.7 kg Gen: intubated and sedated CVS: RRR Resp: ventilated BS bilaterally AOZ:HYQMV, hypoactive bowel sounds, + fluid wave Ext:1+ pedal edema bilaterally  Recent Labs  Lab 08/07/23 1606 08/07/23 1618 08/08/23 0527 08/08/23 0800 08/08/23 1224 08/08/23 1459 08/08/23 1611 08/08/23 1757 08/09/23 0002 08/09/23 0307  NA 132*   < > 134* 135 137 138 136 135 136 138  K 2.8*   < > 3.2* 3.0* 3.1* 3.1* 2.9* 2.7* 3.2* 3.3*  CL 106   < > 103 103 103 105 103 102 102 104  CO2 12*  --  11* 15* 18* 16* 20* 21* 23 24  GLUCOSE 107*   < > 197* 193* 187* 191* 174* 159* 136* 131*  BUN 17   < > 17 18 16 16 17 16 17 17   CREATININE 1.72*   < > 2.01* 1.60* 1.75* 1.79* 1.59* 1.69* 1.78* 1.84*  ALBUMIN 2.0*  --  1.9*  --   --  2.1* 2.3*  --   --  2.2*  CALCIUM 7.9*  --  7.5* 7.9* 7.9* 7.9* 7.9* 7.8* 8.1* 8.1*  PHOS  --   --  5.9*  --   --  4.8* 4.1  --   --  4.0  AST 55*  --  72*  --   --  87*  --   --   --  61*  ALT 24  --  28  --   --  29  --   --   --  25   < > = values in this interval not displayed.   Liver Function Tests: Recent Labs   Lab 08/08/23 0527 08/08/23 1459 08/08/23 1611 08/09/23 0307  AST 72* 87*  --  61*  ALT 28 29  --  25  ALKPHOS 62 58  --  60  BILITOT 6.5* 7.8*  --  6.2*  PROT 5.0* 5.2*  --  5.0*  ALBUMIN 1.9* 2.1* 2.3* 2.2*   Recent Labs  Lab 08/09/23 0307  LIPASE 94*  AMYLASE 128*   Recent Labs  Lab 08/07/23 2146  AMMONIA 192*   CBC: Recent Labs  Lab 08/07/23 1606 08/07/23 1618 08/08/23 0527 08/08/23 1611 08/09/23 0307 08/09/23 0308  WBC 10.8*  --  15.4* 16.9* 16.2*  --   NEUTROABS 8.6*  --   --   --   --   --   HGB 5.0*   < > 6.9* 8.0* 7.2*  --   HCT 15.8*   < > 21.3* 22.9* 20.2*  --   MCV 95.8  --  91.0 87.1 86.3  --  PLT 187  --  170 132* 132* 146*   < > = values in this interval not displayed.   Cardiac Enzymes: No results for input(s): "CKTOTAL", "CKMB", "CKMBINDEX", "TROPONINI" in the last 168 hours. CBG: Recent Labs  Lab 08/08/23 2318 08/09/23 0343 08/09/23 0409 08/09/23 0738 08/09/23 1159  GLUCAP 119* 126* 137* 125* 126*    Iron Studies: No results for input(s): "IRON", "TIBC", "TRANSFERRIN", "FERRITIN" in the last 72 hours. Studies/Results: DG Chest Port 1 View Result Date: 08/09/2023 CLINICAL DATA:  Endotracheal tube positioning. EXAM: PORTABLE CHEST 1 VIEW COMPARISON:  Radiographs earlier the same date and 08/08/2023. CT 07/24/2020. FINDINGS: Tip of the endotracheal tube overlies the mid trachea, just above the top of the aortic arch, approximately 5.8 cm above the carina. Enteric tube projects below the diaphragm, tip not visualized. The heart size and mediastinal contours are stable. No significant change in bilateral perihilar airspace opacities. No evidence of pneumothorax or significant pleural effusion. Old rib fracture noted on the left. IMPRESSION: Satisfactory position of endotracheal tube. No significant change in bilateral perihilar airspace opacities. Electronically Signed   By: Carey Bullocks M.D.   On: 08/09/2023 12:37   [place] EKG SITE RITE Result  Date: 08/09/2023 If Site Rite image not attached, placement could not be confirmed due to current cardiac rhythm.  DG CHEST PORT 1 VIEW Result Date: 08/09/2023 CLINICAL DATA:  [id number].  Endotracheal tube present. EXAM: PORTABLE CHEST 1 VIEW COMPARISON:  Portable chest yesterday at 5:51 p.m. FINDINGS: 4:16 a.m. ETT is not seen on today's exam and has either been removed or pulled back above the plane of the study. NGT is well placed terminating in the distal body of stomach. Stable cardiomegaly and central vascular fullness. Patchy dense opacities of the left-greater-than-right lungs demonstrate no interval improvement or worsening. There is a small underlying left pleural effusion. The mediastinum is stable. No new osseous findings. There is a tangle of overlying monitor wires. IMPRESSION: 1. ETT is not seen on today's exam and has either been removed or pulled back above the plane of the study. 2. NGT is well placed terminating in the distal body of stomach. 3. Stable cardiomegaly and central vascular fullness. 4. Patchy dense opacities of the left-greater-than-right lungs demonstrate no interval improvement or worsening. Small left pleural effusion. Electronically Signed   By: Almira Bar M.D.   On: 08/09/2023 05:54   DG CHEST PORT 1 VIEW Result Date: 08/08/2023 CLINICAL DATA:  Check endotracheal tube placement EXAM: PORTABLE CHEST 1 VIEW COMPARISON:  08/07/2023 FINDINGS: Endotracheal tube and gastric catheter are noted in satisfactory position. Cardiac shadow is stable. Patchy bilateral airspace opacities are again identified stable from the prior exam. No new focal abnormality is noted. IMPRESSION: No significant change from the previous day. Electronically Signed   By: Alcide Clever M.D.   On: 08/08/2023 20:02   US RENAL Result Date: 08/08/2023 CLINICAL DATA:  Acute kidney injury EXAM: RENAL / URINARY TRACT ULTRASOUND COMPLETE COMPARISON:  06/20/2023 CT scan FINDINGS: Right Kidney: Renal  measurements: 10.3 by 5.0 by 4.7 cm = volume: 125 mL. Echogenicity within normal limits. No mass or hydronephrosis visualized. The right kidney upper pole cyst shown on the CT from 06/20/2023 was not well characterized. Left Kidney: Renal measurements: 9.9 by 4.9 by 5.3 cm = volume: 133 mL. Echogenicity within normal limits. No mass or hydronephrosis visualized. A 1.5 by 1.5 by 1.8 cm upper pole cyst is present. No further imaging workup of this lesion is indicated. Bladder: Appears  normal for degree of bladder distention. Foley catheter in the urinary bladder. Other: Substantial ascites noted along with left pleural effusion. Limitations: Body habitus, overlying bowel gas, patient unable to assist fully with positioning. IMPRESSION: 1. No hydronephrosis. 2. Substantial ascites and left pleural effusion. 3. Foley catheter in the urinary bladder. Electronically Signed   By: Gaylyn Rong M.D.   On: 08/08/2023 14:40   DG Abd Portable 1V Result Date: 08/07/2023 CLINICAL DATA:  OG tube placement EXAM: PORTABLE ABDOMEN - 1 VIEW COMPARISON:  08/07/2023 FINDINGS: Enteric tube tip and side port overlie the proximal stomach. Upper gas pattern is unremarkable. IMPRESSION: Enteric tube tip and side port overlie the proximal stomach. Electronically Signed   By: Jasmine Pang M.D.   On: 08/07/2023 20:30   DG CHEST PORT 1 VIEW Result Date: 08/07/2023 CLINICAL DATA:  Intubated EXAM: PORTABLE CHEST 1 VIEW COMPARISON:  08/07/2023 FINDINGS: Endotracheal tube tip is about 4.6 cm superior to the carina. Enteric tube tip below the diaphragm but incompletely assessed. Cardiac enlargement. No significant change in heterogeneous bilateral perihilar consolidations. IMPRESSION: Endotracheal tube tip about 4.6 cm superior to the carina. No significant change in heterogeneous bilateral perihilar consolidations. Electronically Signed   By: Jasmine Pang M.D.   On: 08/07/2023 20:29   US Abdomen Limited Result Date:  08/07/2023 CLINICAL DATA:  Cirrhosis EXAM: ULTRASOUND ABDOMEN LIMITED RIGHT UPPER QUADRANT COMPARISON:  CT 06/20/2023 FINDINGS: Gallbladder: No shadowing stones. Upper normal wall thickness. Negative sonographic Murphy's. Common bile duct: Diameter: 4 mm Liver: Diffuse increased echogenicity limits assessment for parenchymal abnormality. Mild surface nodularity corresponding to history of cirrhosis. Portal vein is poorly assessed due to habitus and marked fatty infiltration of the liver with poorly visible portal vessels. Other: Small to moderate volume ascites in the right upper quadrant IMPRESSION: 1. Liver cirrhosis with marked echogenic liver parenchyma/fatty infiltration. Unable to document patency of portal vein on this exam, portal vessel poorly visible due to reasons discussed above. 2. Small moderate volume ascites in the right upper quadrant Electronically Signed   By: Jasmine Pang M.D.   On: 08/07/2023 20:28   CT Head Wo Contrast Result Date: 08/07/2023 CLINICAL DATA:  Answered mental status EXAM: CT HEAD WITHOUT CONTRAST TECHNIQUE: Contiguous axial images were obtained from the base of the skull through the vertex without intravenous contrast. RADIATION DOSE REDUCTION: This exam was performed according to the departmental dose-optimization program which includes automated exposure control, adjustment of the mA and/or kV according to patient size and/or use of iterative reconstruction technique. COMPARISON:  CT brain 09/10/2021 FINDINGS: Brain: No evidence of acute infarction, hemorrhage, hydrocephalus, extra-axial collection or mass lesion/mass effect. Vascular: No hyperdense vessel or unexpected calcification. Skull: Normal. Negative for fracture or focal lesion. Sinuses/Orbits: No acute finding. Other: None. IMPRESSION: Negative non contrasted CT appearance of the brain. Electronically Signed   By: Jasmine Pang M.D.   On: 08/07/2023 18:48   DG Abd Portable 1V Result Date: 08/07/2023 CLINICAL  DATA:  Intubated EXAM: PORTABLE ABDOMEN - 1 VIEW COMPARISON:  None Available. FINDINGS: Enteric tube tip near the GE junction, side-port in the region of distal esophagus. Upper gas pattern is unremarkable IMPRESSION: Enteric tube tip near the GE junction, side-port in the region of distal esophagus. Recommend advancement by about 10 cm for more optimal positioning Electronically Signed   By: Jasmine Pang M.D.   On: 08/07/2023 18:46   DG Chest Portable 1 View Result Date: 08/07/2023 CLINICAL DATA:  Altered consciousness.  Status post intubation EXAM: PORTABLE  CHEST 1 VIEW COMPARISON:  07/24/2020 FINDINGS: Endotracheal tube terminates at or just above the carina and should be retracted 2.5-3 cm. Nasogastric tube extends beyond the inferior aspect of the film. Patient rotated to the left. Heart size not well evaluated. No pleural effusion or pneumothorax. Lung volumes are low. Bilateral, right greater than left perihilar and upper lobe airspace disease. IMPRESSION: Endotracheal tube terminates added just above the carina and should be retracted 2.5-3 cm. Development of bilateral perihilar and upper lobe airspace disease, with pattern favoring alveolar edema. Multifocal infection or aspiration could look similar. Electronically Signed   By: Jeronimo Greaves M.D.   On: 08/07/2023 18:29    Chlorhexidine Gluconate Cloth  6 each Topical Daily   feeding supplement (VITAL 1.5 CAL)  1,000 mL Per Tube Q24H   insulin aspart  0-15 Units Subcutaneous Q4H   lactulose  30 g Per Tube QID   midodrine  10 mg Per Tube TID WC   multivitamin  1 tablet Per Tube QHS   mouth rinse  15 mL Mouth Rinse Q2H   pantoprazole (PROTONIX) IV  40 mg Intravenous Q12H   prednisoLONE  40 mg Per Tube Daily    BMET    Component Value Date/Time   NA 138 08/09/2023 0307   NA 137 01/14/2023 1138   K 3.3 (L) 08/09/2023 0307   CL 104 08/09/2023 0307   CO2 24 08/09/2023 0307   GLUCOSE 131 (H) 08/09/2023 0307   BUN 17 08/09/2023 0307   BUN  8 01/14/2023 1138   CREATININE 1.84 (H) 08/09/2023 0307   CREATININE 0.68 04/21/2021 1350   CALCIUM 8.1 (L) 08/09/2023 0307   GFRNONAA 32 (L) 08/09/2023 0307   GFRNONAA >60 04/21/2021 1350   GFRAA 122 09/15/2019 0909   CBC    Component Value Date/Time   WBC 16.2 (H) 08/09/2023 0307   RBC 2.34 (L) 08/09/2023 0307   HGB 7.2 (L) 08/09/2023 0307   HGB 10.0 (L) 02/16/2023 1122   HCT 20.2 (L) 08/09/2023 0307   HCT 30.8 (L) 02/16/2023 1122   PLT 146 (L) 08/09/2023 0308   PLT CANCELED 02/16/2023 1122   MCV 86.3 08/09/2023 0307   MCV 96 02/16/2023 1122   MCH 30.8 08/09/2023 0307   MCHC 35.6 08/09/2023 0307   RDW 19.6 (H) 08/09/2023 0307   RDW 12.1 02/16/2023 1122   LYMPHSABS 1.0 08/07/2023 1606   MONOABS 1.0 08/07/2023 1606   EOSABS 0.1 08/07/2023 1606   BASOSABS 0.0 08/07/2023 1606     Assessment/Plan:  AKI/CKD stage III - presumably ischemic ATN in setting of sepsis, significant anemia, and possible hepatorenal syndrome.  She was for possible CRRT yesterday, however her clinical picture significantly improved after IVF boluses and IV albumin.  She was taken off of pressors and her acidosis improved.  She remains oliguric.  Hold off on CRRT for now but will continue to follow closely.  Renal US without obstruction.  UNa 12 so HRS is possible.   Avoid nephrotoxic medications including NSAIDs and iodinated intravenous contrast exposure unless the latter is absolutely indicated.   Preferred narcotic agents for pain control are hydromorphone, fentanyl, and methadone. Morphine should not be used.  Avoid Baclofen and avoid oral sodium phosphate and magnesium citrate based laxatives / bowel preps.  Continue strict Input and Output monitoring. Will monitor the patient closely with you and intervene or adjust therapy as indicated by changes in clinical status/labs   Circulatory shock - in setting of strep pneumonia.  Off of  levophed at this time and did respond to blood transfusions. AGMA -  due to #1 improved VDRF - intubated in ED due to AMS and CXR with possible aspiration or HAP.  Per PCCM. 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 ABLA - s/p transfusion with improvement of Hgb and Bp. Acute encephalopathy - possibly due to critical illness or hepatic encephalopathy.  Continue with lactulose and follow.   Irena Cords, MD [facility]

## 2023-08-10 ENCOUNTER — Inpatient Hospital Stay (HOSPITAL_COMMUNITY)

## 2023-08-10 DIAGNOSIS — K7290 Hepatic failure, unspecified without coma: Secondary | ICD-10-CM | POA: Diagnosis not present

## 2023-08-10 DIAGNOSIS — N179 Acute kidney failure, unspecified: Secondary | ICD-10-CM

## 2023-08-10 DIAGNOSIS — R6521 Severe sepsis with septic shock: Secondary | ICD-10-CM

## 2023-08-10 DIAGNOSIS — K7030 Alcoholic cirrhosis of liver without ascites: Secondary | ICD-10-CM | POA: Diagnosis not present

## 2023-08-10 DIAGNOSIS — R609 Edema, unspecified: Secondary | ICD-10-CM | POA: Diagnosis not present

## 2023-08-10 DIAGNOSIS — R578 Other shock: Secondary | ICD-10-CM

## 2023-08-10 DIAGNOSIS — Z7189 Other specified counseling: Secondary | ICD-10-CM | POA: Diagnosis not present

## 2023-08-10 DIAGNOSIS — D62 Acute posthemorrhagic anemia: Secondary | ICD-10-CM

## 2023-08-10 DIAGNOSIS — J9601 Acute respiratory failure with hypoxia: Secondary | ICD-10-CM

## 2023-08-10 DIAGNOSIS — R4182 Altered mental status, unspecified: Secondary | ICD-10-CM | POA: Diagnosis not present

## 2023-08-10 DIAGNOSIS — A419 Sepsis, unspecified organism: Principal | ICD-10-CM

## 2023-08-10 DIAGNOSIS — J13 Pneumonia due to Streptococcus pneumoniae: Secondary | ICD-10-CM

## 2023-08-10 DIAGNOSIS — K7682 Hepatic encephalopathy: Secondary | ICD-10-CM | POA: Diagnosis not present

## 2023-08-10 DIAGNOSIS — Z515 Encounter for palliative care: Secondary | ICD-10-CM | POA: Diagnosis not present

## 2023-08-10 DIAGNOSIS — K7031 Alcoholic cirrhosis of liver with ascites: Secondary | ICD-10-CM | POA: Diagnosis not present

## 2023-08-10 LAB — HEPATIC FUNCTION PANEL
ALT: 23 U/L (ref 0–44)
AST: 42 U/L — ABNORMAL HIGH (ref 15–41)
Albumin: 2 g/dL — ABNORMAL LOW (ref 3.5–5.0)
Alkaline Phosphatase: 59 U/L (ref 38–126)
Bilirubin, Direct: 1.9 mg/dL — ABNORMAL HIGH (ref 0.0–0.2)
Indirect Bilirubin: 2 mg/dL — ABNORMAL HIGH (ref 0.3–0.9)
Total Bilirubin: 3.9 mg/dL — ABNORMAL HIGH (ref 0.0–1.2)
Total Protein: 4.7 g/dL — ABNORMAL LOW (ref 6.5–8.1)

## 2023-08-10 LAB — PREPARE FRESH FROZEN PLASMA

## 2023-08-10 LAB — ECHOCARDIOGRAM COMPLETE
AR max vel: 2.38 cm2
AV Area VTI: 2.46 cm2
AV Area mean vel: 2.28 cm2
AV Mean grad: 7 mmHg
AV Peak grad: 13.4 mmHg
Ao pk vel: 1.83 m/s
Area-P 1/2: 3.5 cm2
Est EF: 65
Height: 67 in
S' Lateral: 3.3 cm
Weight: 4493.86 [oz_av]

## 2023-08-10 LAB — RENAL FUNCTION PANEL
Albumin: 2 g/dL — ABNORMAL LOW (ref 3.5–5.0)
Anion gap: 7 (ref 5–15)
BUN: 21 mg/dL — ABNORMAL HIGH (ref 6–20)
CO2: 25 mmol/L (ref 22–32)
Calcium: 8.1 mg/dL — ABNORMAL LOW (ref 8.9–10.3)
Chloride: 101 mmol/L (ref 98–111)
Creatinine, Ser: 1.93 mg/dL — ABNORMAL HIGH (ref 0.44–1.00)
GFR, Estimated: 30 mL/min — ABNORMAL LOW (ref >60)
Glucose, Bld: 117 mg/dL — ABNORMAL HIGH (ref 70–99)
Phosphorus: 3.5 mg/dL (ref 2.5–4.6)
Potassium: 3 mmol/L — ABNORMAL LOW (ref 3.5–5.1)
Sodium: 133 mmol/L — ABNORMAL LOW (ref 135–145)

## 2023-08-10 LAB — CBC
HCT: 20.3 % — ABNORMAL LOW (ref 36.0–46.0)
HCT: 22.2 % — ABNORMAL LOW (ref 36.0–46.0)
Hemoglobin: 6.8 g/dL — CL (ref 12.0–15.0)
Hemoglobin: 7.2 g/dL — ABNORMAL LOW (ref 12.0–15.0)
MCH: 29.7 pg (ref 26.0–34.0)
MCH: 29.1 pg (ref 26.0–34.0)
MCHC: 33.5 g/dL (ref 30.0–36.0)
MCHC: 32.4 g/dL (ref 30.0–36.0)
MCV: 88.6 fL (ref 80.0–100.0)
MCV: 89.9 fL (ref 80.0–100.0)
Platelets: 109 10*3/uL — ABNORMAL LOW (ref 150–400)
Platelets: 103 10*3/uL — ABNORMAL LOW (ref 150–400)
RBC: 2.29 MIL/uL — ABNORMAL LOW (ref 3.87–5.11)
RBC: 2.47 MIL/uL — ABNORMAL LOW (ref 3.87–5.11)
RDW: 19.6 % — ABNORMAL HIGH (ref 11.5–15.5)
RDW: 20.3 % — ABNORMAL HIGH (ref 11.5–15.5)
WBC: 12.6 10*3/uL — ABNORMAL HIGH (ref 4.0–10.5)
WBC: 15.3 10*3/uL — ABNORMAL HIGH (ref 4.0–10.5)
nRBC: 0.7 % — ABNORMAL HIGH (ref 0.0–0.2)
nRBC: 0.5 % — ABNORMAL HIGH (ref 0.0–0.2)

## 2023-08-10 LAB — BPAM FFP
Blood Product Expiration Date: 202503292359
ISSUE DATE / TIME: 202503241343
Unit Type and Rh: 7300

## 2023-08-10 LAB — AMMONIA: Ammonia: 24 umol/L (ref 9–35)

## 2023-08-10 LAB — APTT: aPTT: 41 s — ABNORMAL HIGH (ref 24–36)

## 2023-08-10 LAB — PROTIME-INR
INR: 2.1 — ABNORMAL HIGH (ref 0.8–1.2)
Prothrombin Time: 23.8 s — ABNORMAL HIGH (ref 11.4–15.2)

## 2023-08-10 LAB — HEMOGLOBIN A1C
Hgb A1c MFr Bld: 3.5 % — ABNORMAL LOW (ref 4.8–5.6)
Mean Plasma Glucose: 53.75 mg/dL

## 2023-08-10 LAB — MAGNESIUM
Magnesium: 1.9 mg/dL (ref 1.7–2.4)
Magnesium: 2 mg/dL (ref 1.7–2.4)

## 2023-08-10 LAB — GLUCOSE, CAPILLARY
Glucose-Capillary: 113 mg/dL — ABNORMAL HIGH (ref 70–99)
Glucose-Capillary: 122 mg/dL — ABNORMAL HIGH (ref 70–99)
Glucose-Capillary: 113 mg/dL — ABNORMAL HIGH (ref 70–99)
Glucose-Capillary: 122 mg/dL — ABNORMAL HIGH (ref 70–99)
Glucose-Capillary: 116 mg/dL — ABNORMAL HIGH (ref 70–99)

## 2023-08-10 LAB — HEMOGLOBIN AND HEMATOCRIT, BLOOD
HCT: 22.9 % — ABNORMAL LOW (ref 36.0–46.0)
Hemoglobin: 7.5 g/dL — ABNORMAL LOW (ref 12.0–15.0)

## 2023-08-10 LAB — PHOSPHORUS: Phosphorus: 3.6 mg/dL (ref 2.5–4.6)

## 2023-08-10 MED ORDER — PHYTONADIONE 5 MG PO TABS
2.5000 mg | ORAL_TABLET | Freq: Once | ORAL | Status: AC
  Administered 2023-08-10: 2.5 mg
  Filled 2023-08-10: qty 1

## 2023-08-10 MED ORDER — SODIUM CHLORIDE 0.9% IV SOLUTION: Freq: Once | INTRAVENOUS | Status: AC

## 2023-08-10 MED ORDER — VITAL 1.5 CAL PO LIQD
1000.0000 mL | ORAL | Status: DC
  Administered 2023-08-10: 1000 mL
  Filled 2023-08-10 (×4): qty 1000

## 2023-08-10 MED ORDER — PROSOURCE TF20 ENFIT COMPATIBL EN LIQD
60.0000 mL | Freq: Every day | ENTERAL | Status: DC
  Administered 2023-08-10 – 2023-08-15 (×6): 60 mL
  Filled 2023-08-10 (×6): qty 60

## 2023-08-10 MED ORDER — ALBUMIN HUMAN 25 % IV SOLN
25.0000 g | Freq: Once | INTRAVENOUS | Status: AC
  Administered 2023-08-10: 25 g via INTRAVENOUS
  Filled 2023-08-10: qty 100

## 2023-08-10 MED ORDER — FUROSEMIDE 10 MG/ML IJ SOLN
120.0000 mg | Freq: Four times a day (QID) | INTRAVENOUS | Status: DC
  Administered 2023-08-10 – 2023-08-13 (×12): 120 mg via INTRAVENOUS
  Filled 2023-08-10: qty 2
  Filled 2023-08-10 (×4): qty 10
  Filled 2023-08-10: qty 2
  Filled 2023-08-10 (×2): qty 10
  Filled 2023-08-10: qty 12
  Filled 2023-08-10 (×4): qty 10
  Filled 2023-08-10: qty 12

## 2023-08-10 MED ORDER — POTASSIUM CHLORIDE 20 MEQ PO PACK
40.0000 meq | PACK | ORAL | Status: AC
  Administered 2023-08-10 (×2): 40 meq
  Filled 2023-08-10 (×2): qty 2

## 2023-08-10 NOTE — Consult Note (Signed)
 WOC Nurse Consult Note: Reason for Consult: Consult requested for right arm.  Preformed remotely after review of progress notes and photo in the EMR.  Right arm and hand with generalized edema and erythremia and patchy areas of yellow fluid filled blisters.  Some are intact and others have ruptured and evolved into full thickness tissue loss, red and moist.  Pt is critically ill in ICU with multiple systemic factors which can impair healing.  Dressing procedure/placement/frequency: Topical treatment orders provided for bedside nurses to perform as follows to promote drying and healing: Apply Xeroform gauze to right arm blistered areas and wounds Q day, then cover with ABD pads and hold in place with ace wrap to reduce swelling.  Elevate if possible. Please re-consult if further assistance is needed.  Thank-you,  Cammie Mcgee MSN, RN, CWOCN, [place], CNS [phone]

## 2023-08-10 NOTE — Progress Notes (Signed)
 Echocardiogram 2D Echocardiogram has been performed.  Lucendia Herrlich 08/10/2023, 5:45 PM
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
## 2023-08-10 NOTE — Progress Notes (Signed)
 Bilateral upper extremity venous duplex has been completed. Preliminary results can be found in CV Proc through chart review.   08/10/23 10:14 AM Olen Cordial RVT
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
## 2023-08-10 NOTE — Progress Notes (Signed)
 Patient ID: Elizabeth Bush, female   DOB: Sep 27, 1970, 53 y.o.   MRN: [id number] S: Pt became more agitated today and now with restraints to prevent self-extubation. O:BP 112/64 (BP Location: Left Leg)   Pulse 83   Temp (!) 97.3 F (36.3 C) (Bladder)   Resp 20   Ht 5\' 7"  (1.702 m)   Wt 127.4 kg   LMP  (LMP Unknown)   SpO2 100%   BMI 43.99 kg/m   Intake/Output Summary (Last 24 hours) at 08/10/2023 1358 Last data filed at 08/10/2023 1300 Gross per 24 hour  Intake 1620.52 ml  Output 575 ml  Net 1045.52 ml   Intake/Output: I/O last 3 completed shifts: In: 3957.8 [I.V.:2071.5; Blood:194.5; Other:60; NG/GT:674; IV Piggyback:957.7] Out: 1885 [Urine:625; Emesis/NG output:1000; Stool:260]  Intake/Output this shift:  Total I/O In: 681.5 [I.V.:104.7; NG/GT:335; IV Piggyback:241.8] Out: 105 [Urine:105] Weight change: 11.7 kg WGN:[id number] and sedated CVS: RRR Resp: ventilated BS bilaterally Abd: obese, +BS Ext:2+ anasarca  Recent Labs  Lab 08/07/23 1606 08/07/23 1618 08/08/23 0527 08/08/23 0800 08/08/23 1224 08/08/23 1459 08/08/23 1611 08/08/23 1757 08/09/23 0002 08/09/23 0307 08/10/23 0545  NA 132*   < > 134*   < > 137 138 136 135 136 138 133*  K 2.8*   < > 3.2*   < > 3.1* 3.1* 2.9* 2.7* 3.2* 3.3* 3.0*  CL 106   < > 103   < > 103 105 103 102 102 104 101  CO2 12*  --  11*   < > 18* 16* 20* 21* 23 24 25   GLUCOSE 107*   < > 197*   < > 187* 191* 174* 159* 136* 131* 117*  BUN 17   < > 17   < > 16 16 17 16 17 17  21*  CREATININE 1.72*   < > 2.01*   < > 1.75* 1.79* 1.59* 1.69* 1.78* 1.84* 1.93*  ALBUMIN 2.0*  --  1.9*  --   --  2.1* 2.3*  --   --  2.2* 2.0*  2.0*  CALCIUM 7.9*  --  7.5*   < > 7.9* 7.9* 7.9* 7.8* 8.1* 8.1* 8.1*  PHOS  --   --  5.9*  --   --  4.8* 4.1  --   --  4.0 3.5  AST 55*  --  72*  --   --  87*  --   --   --  61* 42*  ALT 24  --  28  --   --  29  --   --   --  25 23   < > = values in this interval not displayed.   Liver Function Tests: Recent Labs  Lab  08/08/23 1459 08/08/23 1611 08/09/23 0307 08/10/23 0545  AST 87*  --  61* 42*  ALT 29  --  25 23  ALKPHOS 58  --  60 59  BILITOT 7.8*  --  6.2* 3.9*  PROT 5.2*  --  5.0* 4.7*  ALBUMIN 2.1* 2.3* 2.2* 2.0*  2.0*   Recent Labs  Lab 08/09/23 0307  LIPASE 94*  AMYLASE 128*   Recent Labs  Lab 08/07/23 2146 08/09/23 1419  AMMONIA 192* 30   CBC: Recent Labs  Lab 08/07/23 1606 08/07/23 1618 08/08/23 0527 08/08/23 1611 08/09/23 0307 08/09/23 0308 08/10/23 0545 08/10/23 0929  WBC 10.8*  --  15.4* 16.9* 16.2*  --  12.6*  --   NEUTROABS 8.6*  --   --   --   --   --   --   --  HGB 5.0*   < > 6.9* 8.0* 7.2*  --  6.8* 7.5*  HCT 15.8*   < > 21.3* 22.9* 20.2*  --  20.3* 22.9*  MCV 95.8  --  91.0 87.1 86.3  --  88.6  --   PLT 187  --  170 132* 132* 146* 109*  --    < > = values in this interval not displayed.   Cardiac Enzymes: No results for input(s): "CKTOTAL", "CKMB", "CKMBINDEX", "TROPONINI" in the last 168 hours. CBG: Recent Labs  Lab 08/09/23 1953 08/09/23 2357 08/10/23 0339 08/10/23 0732 08/10/23 1148  GLUCAP 109* 103* 113* 122* 113*    Iron Studies: No results for input(s): "IRON", "TIBC", "TRANSFERRIN", "FERRITIN" in the last 72 hours. Studies/Results: VAS [place] UPPER EXTREMITY VENOUS DUPLEX Result Date: 08/10/2023 UPPER VENOUS STUDY  Patient Name:  Elizabeth Bush  Date of Exam:   08/10/2023 Medical Rec #: [id number]      Accession #:    [id number] Date of Birth: Oct 23, 1970       Patient Gender: F Patient Age:   54 years Exam Location:  [facility] Procedure:      VAS [place] UPPER EXTREMITY VENOUS DUPLEX Referring Phys: Emilie Rutter --------------------------------------------------------------------------------  Indications: Edema Risk Factors: None identified. Limitations: Poor ultrasound/tissue interface, bandages, open wound, line and patient positioning, patient immobility, vent. Comparison Study: No prior studies. Performing Technologist: Chanda Busing RVT   Examination Guidelines: A complete evaluation includes B-mode imaging, spectral Doppler, color Doppler, and power Doppler as needed of all accessible portions of each vessel. Bilateral testing is considered an integral part of a complete examination. Limited examinations for reoccurring indications may be performed as noted.  Right Findings: +----------+------------+---------+-----------+----------+-------+ RIGHT     CompressiblePhasicitySpontaneousPropertiesSummary +----------+------------+---------+-----------+----------+-------+ IJV           Full       Yes       Yes                      +----------+------------+---------+-----------+----------+-------+ Subclavian    Full       Yes       Yes                      +----------+------------+---------+-----------+----------+-------+ Axillary      Full       Yes       Yes                      +----------+------------+---------+-----------+----------+-------+ Brachial      Full                                          +----------+------------+---------+-----------+----------+-------+ Radial        Full                                          +----------+------------+---------+-----------+----------+-------+ Ulnar         Full                                          +----------+------------+---------+-----------+----------+-------+ Cephalic      Full                                          +----------+------------+---------+-----------+----------+-------+  Basilic       Full                                          +----------+------------+---------+-----------+----------+-------+  Left Findings: +----------+------------+---------+-----------+----------+-------+ LEFT      CompressiblePhasicitySpontaneousPropertiesSummary +----------+------------+---------+-----------+----------+-------+ IJV           Full       Yes       Yes                       +----------+------------+---------+-----------+----------+-------+ Subclavian    Full       Yes       Yes                      +----------+------------+---------+-----------+----------+-------+ Axillary      Full       Yes       Yes                      +----------+------------+---------+-----------+----------+-------+ Brachial      Full                                          +----------+------------+---------+-----------+----------+-------+ Radial        Full                                          +----------+------------+---------+-----------+----------+-------+ Ulnar         Full                                          +----------+------------+---------+-----------+----------+-------+ Cephalic      Full                                          +----------+------------+---------+-----------+----------+-------+ Basilic       Full                                          +----------+------------+---------+-----------+----------+-------+  Summary:  Right: No evidence of deep vein thrombosis in the upper extremity. No evidence of superficial vein thrombosis in the upper extremity.  Left: No evidence of deep vein thrombosis in the upper extremity. No evidence of superficial vein thrombosis in the upper extremity.  *See table(s) above for measurements and observations.  Diagnosing physician: Carolynn Sayers Electronically signed by Carolynn Sayers on 08/10/2023 at 12:19:16 PM.    Final    DG Chest Port 1 View Result Date: 08/09/2023 CLINICAL DATA:  Endotracheal tube positioning. EXAM: PORTABLE CHEST 1 VIEW COMPARISON:  Radiographs earlier the same date and 08/08/2023. CT 07/24/2020. FINDINGS: Tip of the endotracheal tube overlies the mid trachea, just above the top of the aortic arch, approximately 5.8 cm above the carina. Enteric tube projects below the diaphragm, tip not visualized. The heart size and mediastinal contours are stable. No significant change in  bilateral perihilar  airspace opacities. No evidence of pneumothorax or significant pleural effusion. Old rib fracture noted on the left. IMPRESSION: Satisfactory position of endotracheal tube. No significant change in bilateral perihilar airspace opacities. Electronically Signed   By: Carey Bullocks M.D.   On: 08/09/2023 12:37   [place] EKG SITE RITE Result Date: 08/09/2023 If Site Rite image not attached, placement could not be confirmed due to current cardiac rhythm.  DG CHEST PORT 1 VIEW Result Date: 08/09/2023 CLINICAL DATA:  [id number].  Endotracheal tube present. EXAM: PORTABLE CHEST 1 VIEW COMPARISON:  Portable chest yesterday at 5:51 p.m. FINDINGS: 4:16 a.m. ETT is not seen on today's exam and has either been removed or pulled back above the plane of the study. NGT is well placed terminating in the distal body of stomach. Stable cardiomegaly and central vascular fullness. Patchy dense opacities of the left-greater-than-right lungs demonstrate no interval improvement or worsening. There is a small underlying left pleural effusion. The mediastinum is stable. No new osseous findings. There is a tangle of overlying monitor wires. IMPRESSION: 1. ETT is not seen on today's exam and has either been removed or pulled back above the plane of the study. 2. NGT is well placed terminating in the distal body of stomach. 3. Stable cardiomegaly and central vascular fullness. 4. Patchy dense opacities of the left-greater-than-right lungs demonstrate no interval improvement or worsening. Small left pleural effusion. Electronically Signed   By: Almira Bar M.D.   On: 08/09/2023 05:54   DG CHEST PORT 1 VIEW Result Date: 08/08/2023 CLINICAL DATA:  Check endotracheal tube placement EXAM: PORTABLE CHEST 1 VIEW COMPARISON:  08/07/2023 FINDINGS: Endotracheal tube and gastric catheter are noted in satisfactory position. Cardiac shadow is stable. Patchy bilateral airspace opacities are again identified stable from the prior  exam. No new focal abnormality is noted. IMPRESSION: No significant change from the previous day. Electronically Signed   By: Alcide Clever M.D.   On: 08/08/2023 20:02    sodium chloride   Intravenous Once   Chlorhexidine Gluconate Cloth  6 each Topical Daily   feeding supplement (PROSource TF20)  60 mL Per Tube Daily   insulin aspart  0-15 Units Subcutaneous Q4H   lactulose  30 g Per Tube QID   midodrine  10 mg Per Tube TID WC   mouth rinse  15 mL Mouth Rinse Q2H   pantoprazole (PROTONIX) IV  40 mg Intravenous Q12H   phytonadione  2.5 mg Per Tube Once   prednisoLONE  40 mg Per Tube Daily   sodium chloride flush  10-40 mL Intracatheter Q12H    BMET    Component Value Date/Time   NA 133 (L) 08/10/2023 0545   NA 137 01/14/2023 1138   K 3.0 (L) 08/10/2023 0545   CL 101 08/10/2023 0545   CO2 25 08/10/2023 0545   GLUCOSE 117 (H) 08/10/2023 0545   BUN 21 (H) 08/10/2023 0545   BUN 8 01/14/2023 1138   CREATININE 1.93 (H) 08/10/2023 0545   CREATININE 0.68 04/21/2021 1350   CALCIUM 8.1 (L) 08/10/2023 0545   GFRNONAA 30 (L) 08/10/2023 0545   GFRNONAA >60 04/21/2021 1350   GFRAA 122 09/15/2019 0909   CBC    Component Value Date/Time   WBC 12.6 (H) 08/10/2023 0545   RBC 2.29 (L) 08/10/2023 0545   HGB 7.5 (L) 08/10/2023 0929   HGB 10.0 (L) 02/16/2023 1122   HCT 22.9 (L) 08/10/2023 0929   HCT 30.8 (L) 02/16/2023 1122   PLT 109 (L) 08/10/2023 0545  PLT CANCELED 02/16/2023 1122   MCV 88.6 08/10/2023 0545   MCV 96 02/16/2023 1122   MCH 29.7 08/10/2023 0545   MCHC 33.5 08/10/2023 0545   RDW 19.6 (H) 08/10/2023 0545   RDW 12.1 02/16/2023 1122   LYMPHSABS 1.0 08/07/2023 1606   MONOABS 1.0 08/07/2023 1606   EOSABS 0.1 08/07/2023 1606   BASOSABS 0.0 08/07/2023 1606      Assessment/Plan:   AKI/CKD stage III - presumably ischemic ATN in setting of sepsis, significant anemia, and possible hepatorenal syndrome.  Renal US without obstruction.  UNa 12 so HRS is possible.  She was for  possible CRRT at time of admission, however her clinical picture significantly improved after IVF boluses and IV albumin.  She was taken off of pressors and her acidosis improved.  She remains oliguric and UOP has been dropping and she now has significant volume overload.  Will dose with high dose IV lasix and follow response but we may need to start CRRT tomorrow if she does not respond.   Avoid nephrotoxic medications including NSAIDs and iodinated intravenous contrast exposure unless the latter is absolutely indicated.   Preferred narcotic agents for pain control are hydromorphone, fentanyl, and methadone. Morphine should not be used.  Avoid Baclofen and avoid oral sodium phosphate and magnesium citrate based laxatives / bowel preps.  Continue strict Input and Output monitoring. Will monitor the patient closely with you and intervene or adjust therapy as indicated by changes in clinical status/labs   Circulatory shock - in setting of strep pneumonia.  Off of levophed at this time and did respond to blood transfusions. AGMA - due to #1 improved VDRF - intubated in ED due to AMS and CXR with possible aspiration or HAP.  Per PCCM. 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 ABLA - s/p transfusion with improvement of Hgb and Bp. Acute encephalopathy - possibly due to critical illness or hepatic encephalopathy.  Continue with lactulose and follow.   Irena Cords, MD [employer] [phone]

## 2023-08-10 NOTE — Progress Notes (Signed)
 Brief Nutrition Note  Per discussion with CCM, can start advancing tube feeds towards goal today. Meds and labs reviewed. Remains off pressors. Drop in electrolytes since starting TF, will plan to advance slowly towards goal. Discussed plan with RN.   Latest Reference Range & Units 08/09/23 03:07 08/10/23 05:45  Potassium 3.5 - 5.1 mmol/L 3.3 (L) 3.0 (L)  Phosphorus 2.5 - 4.6 mg/dL 4.0 3.5  Magnesium 1.7 - 2.4 mg/dL 2.0 1.9  (L): Data is abnormally low (H): Data is abnormally high  Interventions: - Begin advancing tube feeds to goal today: Vital 1.5 at 50 ml/h (1200 ml per day) *Increase to 51mL/hr then begin increasing by 10mL Q12H Prosource TF20 60 ml daily Provides 1880 kcal, 101 gm protein, 917 ml free water daily   - Monitor magnesium, potassium, and phosphorus BID for at least 3 days, MD to replete as needed, as pt may be at risk for refeeding syndrome.   - FWF per CCM /MD.   Shelle Iron RD, LDN Contact via Secure Chat.

## 2023-08-10 NOTE — Progress Notes (Signed)
  Gastroenterology Progress Note  CC:  ETOH liver disease, HE   Subjective:  More agitated today, requiring restraints so not to self-extubate.  No sign of GI bleeding.  Getting swollen with weeping areas on her arms.  Objective:  Vital signs in last 24 hours: Temp:  [95.7 F (35.4 C)-99.5 F (37.5 C)] 97.3 F (36.3 C) (03/25 1138) Pulse Rate:  [72-83] 81 (03/25 1138) Resp:  [20] 20 (03/25 1138) BP: (94-125)/(46-67) 112/67 (03/25 1130) SpO2:  [91 %-99 %] 99 % (03/25 1138) FiO2 (%):  [35 %-40 %] 35 % (03/25 1100) Weight:  [127.4 kg] 127.4 kg (03/25 0500) Last BM Date : 08/10/23 General:  Intubated and sedated. Heart:  Regular rate and rhythm; no murmurs Pulm:  Mechanical breath sounds noted.  Abdomen:  Soft, distended.  BS present. Extremities:  Swelling of upper extremities with weeping areas/blisters.  Intake/Output from previous day: 03/24 0701 - 03/25 0700 In: 2112.9 [I.V.:805.6; Blood:194.5; NG/GT:524; IV Piggyback:558.8] Out: 1190 [Urine:525; Emesis/NG output:450; Stool:215] Intake/Output this shift: Total I/O In: 681.5 [I.V.:104.7; NG/GT:335; IV Piggyback:241.8] Out: 80 [Urine:80]  Lab Results: Recent Labs    08/08/23 1611 08/09/23 0307 08/09/23 0308 08/10/23 0545 08/10/23 0929  WBC 16.9* 16.2*  --  12.6*  --   HGB 8.0* 7.2*  --  6.8* 7.5*  HCT 22.9* 20.2*  --  20.3* 22.9*  PLT 132* 132* 146* 109*  --    BMET Recent Labs    08/09/23 0002 08/09/23 0307 08/10/23 0545  NA 136 138 133*  K 3.2* 3.3* 3.0*  CL 102 104 101  CO2 23 24 25   GLUCOSE 136* 131* 117*  BUN 17 17 21*  CREATININE 1.78* 1.84* 1.93*  CALCIUM 8.1* 8.1* 8.1*   LFT Recent Labs    08/10/23 0545  PROT 4.7*  ALBUMIN 2.0*  2.0*  AST 42*  ALT 23  ALKPHOS 59  BILITOT 3.9*  BILIDIR 1.9*  IBILI 2.0*   PT/INR Recent Labs    08/09/23 0308 08/10/23 0929  LABPROT 26.6* 23.8*  INR 2.4* 2.1*   VAS [place] UPPER EXTREMITY VENOUS DUPLEX Result Date: 08/10/2023 UPPER VENOUS  STUDY  Patient Name:  Elizabeth Bush  Date of Exam:   08/10/2023 Medical Rec #: [id number]      Accession #:    [id number] Date of Birth: Aug 31, 1970       Patient Gender: F Patient Age:   53 years Exam Location:  [facility] Procedure:      VAS [place] UPPER EXTREMITY VENOUS DUPLEX Referring Phys: Emilie Rutter --------------------------------------------------------------------------------  Indications: Edema Risk Factors: None identified. Limitations: Poor ultrasound/tissue interface, bandages, open wound, line and patient positioning, patient immobility, vent. Comparison Study: No prior studies. Performing Technologist: Chanda Busing RVT  Examination Guidelines: A complete evaluation includes B-mode imaging, spectral Doppler, color Doppler, and power Doppler as needed of all accessible portions of each vessel. Bilateral testing is considered an integral part of a complete examination. Limited examinations for reoccurring indications may be performed as noted.  Right Findings: +----------+------------+---------+-----------+----------+-------+ RIGHT     CompressiblePhasicitySpontaneousPropertiesSummary +----------+------------+---------+-----------+----------+-------+ IJV           Full       Yes       Yes                      +----------+------------+---------+-----------+----------+-------+ Subclavian    Full       Yes       Yes                      +----------+------------+---------+-----------+----------+-------+  Axillary      Full       Yes       Yes                      +----------+------------+---------+-----------+----------+-------+ Brachial      Full                                          +----------+------------+---------+-----------+----------+-------+ Radial        Full                                          +----------+------------+---------+-----------+----------+-------+ Ulnar         Full                                           +----------+------------+---------+-----------+----------+-------+ Cephalic      Full                                          +----------+------------+---------+-----------+----------+-------+ Basilic       Full                                          +----------+------------+---------+-----------+----------+-------+  Left Findings: +----------+------------+---------+-----------+----------+-------+ LEFT      CompressiblePhasicitySpontaneousPropertiesSummary +----------+------------+---------+-----------+----------+-------+ IJV           Full       Yes       Yes                      +----------+------------+---------+-----------+----------+-------+ Subclavian    Full       Yes       Yes                      +----------+------------+---------+-----------+----------+-------+ Axillary      Full       Yes       Yes                      +----------+------------+---------+-----------+----------+-------+ Brachial      Full                                          +----------+------------+---------+-----------+----------+-------+ Radial        Full                                          +----------+------------+---------+-----------+----------+-------+ Ulnar         Full                                          +----------+------------+---------+-----------+----------+-------+ Cephalic      Full                                          +----------+------------+---------+-----------+----------+-------+  Basilic       Full                                          +----------+------------+---------+-----------+----------+-------+  Summary:  Right: No evidence of deep vein thrombosis in the upper extremity. No evidence of superficial vein thrombosis in the upper extremity.  Left: No evidence of deep vein thrombosis in the upper extremity. No evidence of superficial vein thrombosis in the upper extremity.  *See table(s) above for measurements and  observations.  Diagnosing physician: Carolynn Sayers Electronically signed by Carolynn Sayers on 08/10/2023 at 12:19:16 PM.    Final    DG Chest Port 1 View Result Date: 08/09/2023 CLINICAL DATA:  Endotracheal tube positioning. EXAM: PORTABLE CHEST 1 VIEW COMPARISON:  Radiographs earlier the same date and 08/08/2023. CT 07/24/2020. FINDINGS: Tip of the endotracheal tube overlies the mid trachea, just above the top of the aortic arch, approximately 5.8 cm above the carina. Enteric tube projects below the diaphragm, tip not visualized. The heart size and mediastinal contours are stable. No significant change in bilateral perihilar airspace opacities. No evidence of pneumothorax or significant pleural effusion. Old rib fracture noted on the left. IMPRESSION: Satisfactory position of endotracheal tube. No significant change in bilateral perihilar airspace opacities. Electronically Signed   By: Carey Bullocks M.D.   On: 08/09/2023 12:37   [place] EKG SITE RITE Result Date: 08/09/2023 If Site Rite image not attached, placement could not be confirmed due to current cardiac rhythm.  DG CHEST PORT 1 VIEW Result Date: 08/09/2023 CLINICAL DATA:  [id number].  Endotracheal tube present. EXAM: PORTABLE CHEST 1 VIEW COMPARISON:  Portable chest yesterday at 5:51 p.m. FINDINGS: 4:16 a.m. ETT is not seen on today's exam and has either been removed or pulled back above the plane of the study. NGT is well placed terminating in the distal body of stomach. Stable cardiomegaly and central vascular fullness. Patchy dense opacities of the left-greater-than-right lungs demonstrate no interval improvement or worsening. There is a small underlying left pleural effusion. The mediastinum is stable. No new osseous findings. There is a tangle of overlying monitor wires. IMPRESSION: 1. ETT is not seen on today's exam and has either been removed or pulled back above the plane of the study. 2. NGT is well placed terminating in the distal body of  stomach. 3. Stable cardiomegaly and central vascular fullness. 4. Patchy dense opacities of the left-greater-than-right lungs demonstrate no interval improvement or worsening. Small left pleural effusion. Electronically Signed   By: Almira Bar M.D.   On: 08/09/2023 05:54   DG CHEST PORT 1 VIEW Result Date: 08/08/2023 CLINICAL DATA:  Check endotracheal tube placement EXAM: PORTABLE CHEST 1 VIEW COMPARISON:  08/07/2023 FINDINGS: Endotracheal tube and gastric catheter are noted in satisfactory position. Cardiac shadow is stable. Patchy bilateral airspace opacities are again identified stable from the prior exam. No new focal abnormality is noted. IMPRESSION: No significant change from the previous day. Electronically Signed   By: Alcide Clever M.D.   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 Did not have signs of portal htn on EGD 06/2023 (NL).  INR 2.1, platelets 109.  Ammonia level now normal.   MELD 3.0: 31 at 08/10/2023  9:29 AM MELD-Na: 28 at 08/10/2023  9:29 AM Calculated from: Serum Creatinine: 1.93 mg/dL at  08/10/2023  5:45 AM Serum Sodium: 133 mmol/L at 08/10/2023  5:45 AM Total Bilirubin: 3.9 mg/dL at 1/61/0960  4:54 AM Serum Albumin: 2 g/dL at 0/98/1191  4:78 AM INR(ratio): 2.1 at 08/10/2023  9:29 AM Age at listing (hypothetical): 53 years Sex: Female at 08/10/2023  9:29 AM   Maddrey's DF on admit was 78.9.   2) Sepsis/Shock, Pneumococcal pneumonia possible ARDS   3) Acute kidney injury - ATN from shock and/or hepatorenal syndrome.  Receiving albumin and midodrine.  Nephrology is on board.   4) Severe metabolic acidosis, parameters improving.   5) Chronic rectal bleeding from hemorrhoids and chronic anemia, w/ severe anemia +decreased Hgb this admission.  Has received 3 units of packed red blood cells this admission.  Hemoglobin 6.8 g this morning, but repeat was 7.5 grams so not transfused.    The patient is critically ill - agree w/ care as per CCM, nephrology. Prognosis poor, but has had some improvement.  Is off of pressors and making some urine, although minimal.   Is on prednisolone 40 mg daily.   Lactulose for HE, currently on 30 g 4 times daily.   Blood product support, serial coags.  ?  Trial of IV vitamin K x 3 days to see if that helps with coagulopathy.   Hold off on paracentesis given amount seen on [place] and overall situation (on broad spectrum Abx already).     LOS: 3 days   Princella Pellegrini. Zen Cedillos  08/10/2023, 12:50 PM

## 2023-08-11 ENCOUNTER — Inpatient Hospital Stay (HOSPITAL_COMMUNITY)

## 2023-08-11 DIAGNOSIS — N179 Acute kidney failure, unspecified: Secondary | ICD-10-CM | POA: Diagnosis not present

## 2023-08-11 DIAGNOSIS — K767 Hepatorenal syndrome: Secondary | ICD-10-CM

## 2023-08-11 DIAGNOSIS — K7011 Alcoholic hepatitis with ascites: Secondary | ICD-10-CM

## 2023-08-11 DIAGNOSIS — Z66 Do not resuscitate: Secondary | ICD-10-CM

## 2023-08-11 DIAGNOSIS — K7030 Alcoholic cirrhosis of liver without ascites: Secondary | ICD-10-CM | POA: Diagnosis not present

## 2023-08-11 DIAGNOSIS — K7290 Hepatic failure, unspecified without coma: Secondary | ICD-10-CM | POA: Diagnosis not present

## 2023-08-11 DIAGNOSIS — R4589 Other symptoms and signs involving emotional state: Secondary | ICD-10-CM

## 2023-08-11 DIAGNOSIS — K7682 Hepatic encephalopathy: Secondary | ICD-10-CM | POA: Diagnosis not present

## 2023-08-11 DIAGNOSIS — K7031 Alcoholic cirrhosis of liver with ascites: Secondary | ICD-10-CM | POA: Diagnosis not present

## 2023-08-11 DIAGNOSIS — R4182 Altered mental status, unspecified: Secondary | ICD-10-CM | POA: Diagnosis not present

## 2023-08-11 DIAGNOSIS — R140 Abdominal distension (gaseous): Secondary | ICD-10-CM

## 2023-08-11 DIAGNOSIS — Z515 Encounter for palliative care: Secondary | ICD-10-CM | POA: Diagnosis not present

## 2023-08-11 LAB — PREPARE FRESH FROZEN PLASMA

## 2023-08-11 LAB — CBC
HCT: 24.6 % — ABNORMAL LOW (ref 36.0–46.0)
Hemoglobin: 7.7 g/dL — ABNORMAL LOW (ref 12.0–15.0)
MCH: 28.3 pg (ref 26.0–34.0)
MCHC: 31.3 g/dL (ref 30.0–36.0)
MCV: 90.4 fL (ref 80.0–100.0)
Platelets: 102 10*3/uL — ABNORMAL LOW (ref 150–400)
RBC: 2.72 MIL/uL — ABNORMAL LOW (ref 3.87–5.11)
RDW: 20.1 % — ABNORMAL HIGH (ref 11.5–15.5)
WBC: 16.7 10*3/uL — ABNORMAL HIGH (ref 4.0–10.5)
nRBC: 0.2 % (ref 0.0–0.2)

## 2023-08-11 LAB — BPAM FFP
Blood Product Expiration Date: 202503272359
ISSUE DATE / TIME: 202503251605
Unit Type and Rh: 7300

## 2023-08-11 LAB — BASIC METABOLIC PANEL
Anion gap: 11 (ref 5–15)
BUN: 27 mg/dL — ABNORMAL HIGH (ref 6–20)
CO2: 24 mmol/L (ref 22–32)
Calcium: 9 mg/dL (ref 8.9–10.3)
Chloride: 105 mmol/L (ref 98–111)
Creatinine, Ser: 1.88 mg/dL — ABNORMAL HIGH (ref 0.44–1.00)
GFR, Estimated: 31 mL/min — ABNORMAL LOW (ref >60)
Glucose, Bld: 134 mg/dL — ABNORMAL HIGH (ref 70–99)
Potassium: 3 mmol/L — ABNORMAL LOW (ref 3.5–5.1)
Sodium: 140 mmol/L (ref 135–145)

## 2023-08-11 LAB — GLUCOSE, CAPILLARY
Glucose-Capillary: 116 mg/dL — ABNORMAL HIGH (ref 70–99)
Glucose-Capillary: 116 mg/dL — ABNORMAL HIGH (ref 70–99)
Glucose-Capillary: 125 mg/dL — ABNORMAL HIGH (ref 70–99)
Glucose-Capillary: 131 mg/dL — ABNORMAL HIGH (ref 70–99)
Glucose-Capillary: 132 mg/dL — ABNORMAL HIGH (ref 70–99)
Glucose-Capillary: 135 mg/dL — ABNORMAL HIGH (ref 70–99)

## 2023-08-11 LAB — HEPATIC FUNCTION PANEL
ALT: 22 U/L (ref 0–44)
AST: 40 U/L (ref 15–41)
Albumin: 2.4 g/dL — ABNORMAL LOW (ref 3.5–5.0)
Alkaline Phosphatase: 78 U/L (ref 38–126)
Bilirubin, Direct: 1.6 mg/dL — ABNORMAL HIGH (ref 0.0–0.2)
Indirect Bilirubin: 1.7 mg/dL — ABNORMAL HIGH (ref 0.3–0.9)
Total Bilirubin: 3.3 mg/dL — ABNORMAL HIGH (ref 0.0–1.2)
Total Protein: 5.3 g/dL — ABNORMAL LOW (ref 6.5–8.1)

## 2023-08-11 LAB — PHOSPHORUS: Phosphorus: 3.3 mg/dL (ref 2.5–4.6)

## 2023-08-11 LAB — RENAL FUNCTION PANEL
Albumin: 2.4 g/dL — ABNORMAL LOW (ref 3.5–5.0)
Anion gap: 10 (ref 5–15)
BUN: 27 mg/dL — ABNORMAL HIGH (ref 6–20)
CO2: 25 mmol/L (ref 22–32)
Calcium: 8.9 mg/dL (ref 8.9–10.3)
Chloride: 105 mmol/L (ref 98–111)
Creatinine, Ser: 2.08 mg/dL — ABNORMAL HIGH (ref 0.44–1.00)
GFR, Estimated: 28 mL/min — ABNORMAL LOW (ref >60)
Glucose, Bld: 131 mg/dL — ABNORMAL HIGH (ref 70–99)
Phosphorus: 3.3 mg/dL (ref 2.5–4.6)
Potassium: 3.2 mmol/L — ABNORMAL LOW (ref 3.5–5.1)
Sodium: 140 mmol/L (ref 135–145)

## 2023-08-11 LAB — PROTIME-INR
INR: 2.1 — ABNORMAL HIGH (ref 0.8–1.2)
Prothrombin Time: 23.5 s — ABNORMAL HIGH (ref 11.4–15.2)

## 2023-08-11 LAB — MAGNESIUM
Magnesium: 1.8 mg/dL (ref 1.7–2.4)
Magnesium: 2 mg/dL (ref 1.7–2.4)

## 2023-08-11 LAB — LACTIC ACID, PLASMA: Lactic Acid, Venous: 1.5 mmol/L (ref 0.5–1.9)

## 2023-08-11 LAB — CALCIUM, IONIZED: Calcium, Ionized, Serum: 4.8 mg/dL (ref 4.5–5.6)

## 2023-08-11 MED ORDER — POTASSIUM CHLORIDE 10 MEQ/50ML IV SOLN
10.0000 meq | INTRAVENOUS | Status: AC
  Administered 2023-08-11 – 2023-08-12 (×6): 10 meq via INTRAVENOUS
  Filled 2023-08-11 (×6): qty 50

## 2023-08-11 MED ORDER — DEXMEDETOMIDINE HCL IN NACL 200 MCG/50ML IV SOLN
0.0000 ug/kg/h | INTRAVENOUS | Status: DC
  Administered 2023-08-11: 0.4 ug/kg/h via INTRAVENOUS
  Filled 2023-08-11: qty 50

## 2023-08-11 MED ORDER — MAGNESIUM SULFATE 2 GM/50ML IV SOLN
2.0000 g | Freq: Once | INTRAVENOUS | Status: AC
  Administered 2023-08-11: 2 g via INTRAVENOUS
  Filled 2023-08-11: qty 50

## 2023-08-11 MED ORDER — HYDRALAZINE HCL 20 MG/ML IJ SOLN: 5.0000 mg | Freq: Four times a day (QID) | INTRAMUSCULAR | Status: DC | PRN

## 2023-08-11 MED ORDER — POTASSIUM CHLORIDE 20 MEQ PO PACK
40.0000 meq | PACK | Freq: Once | ORAL | Status: AC
  Administered 2023-08-11: 40 meq
  Filled 2023-08-11: qty 2

## 2023-08-11 MED ORDER — POTASSIUM CHLORIDE 10 MEQ/100ML IV SOLN: 10.0000 meq | INTRAVENOUS | Status: DC

## 2023-08-11 MED ORDER — DEXMEDETOMIDINE HCL IN NACL 400 MCG/100ML IV SOLN
0.0000 ug/kg/h | INTRAVENOUS | Status: DC
  Administered 2023-08-11 – 2023-08-12 (×3): 0.5 ug/kg/h via INTRAVENOUS
  Filled 2023-08-11 (×3): qty 100

## 2023-08-11 NOTE — Progress Notes (Signed)
 Daily Progress Note   Patient Name: Elizabeth Bush       Date: 08/11/2023 DOB: [birth year]  Age: 53 y.o. MRN#: [id number] Attending Physician: Oretha Milch, MD Primary Care Physician: Marcine Matar, MD Admit Date: 08/07/2023 Length of Stay: 4 days  Reason for Consultation/Follow-up: Establishing goals of care  Subjective:   CC: Patient remains intubated and spoke with patient's children at bedside.  Following up regarding complex medical decision making.  Subjective:  Reviewed EMR prior to presenting to bedside.  Also discussed care with PCCM provider, nephrology, and RN to coordinate care.  Planned family meeting today at noon.  Went to see patient prior to meeting.  Patient remains edematous.  Patient continuing to have agitation when sedating medications weaned which is preventing extubation at this time.  Presented to bedside when informed by RN that family was present.  During the meeting joined by [facility] PA and nephrology, Dr. Arrie Aran.  Present at bedside for patient's family were her daughterNelle Bush and sonLisbeth Bush.  They were able to call their sister (patient's other daughter)- Elizabeth Bush so she could participate in conversation as well.  After introductions able to spend time learning about patient prior to hospitalization. Elizabeth Bush noted she had moved back home about 2 months ago and was able to describe patient's gradual decline and hospitalizations.  Daughter described that patient was eating less, sleeping more, and having multiple falls.   With permission, then able to spend time discussing patient's current medical illness including her liver disease and how this is affecting her renal function.  Expressed concern about inability to "fix" to these problems and essentially are supporting with medical care though this will not overall change the processes of her liver and kidneys.  Spent time discussing progression of disease.  Also discussed how when attempting to wean patient's  sedation to assist with management for extubation, patient becomes severely agitated not following commands.  Expressed how this leads to concern about ability to protect airway.  At this time patient is making urine with albumin and IV Lasix.  We spent time discussing how based on patient's medical illness, she is not a candidate for long-term hemodialysis.  Feel that even CRRT at this time would not change overall outcome.  Spent time answering questions regarding this. Discussed possible pathways for medical care moving forward.  At this time family wanting to proceed with current medical interventions.  Patient will not be started on CRRT since would not change overall outcome.  Attempting to medically "optimize" as able with plan for extubation as per PCCM guidance.  Once patient is extubated, she will not be reintubated.  If patient is able to breathe and protect her airway on her own, would continue appropriate medical management monitoring for signs of deterioration.  Should patient deteriorate after extubation, recommended transition to comfort focused care.  Encouraged there would be further discussions as this process continues.  Family agreeing with this plan.  Encouraged that family be present once time for extubation determined in case patient does not do well and need to focus on her comfort at the end of life.  We also discussed that patient could deteriorate prior to extubation.  Should she medically deteriorate, interventions such as cardiac resuscitation would not bring quality of life in someone with her severe medical illnesses.  Family agreeing with CODE STATUS changed to DNR/DNI.  Spent time providing emotional support via active listening.  Answered questions as able.  Noted palliative medicine team will continue  to follow with patient's medical journey.  Discussed care with IDT to coordinate care planning moving forward.  Objective:   Vital Signs:  BP (!) 115/56 (BP Location: Left  Leg)   Pulse 85   Temp 98.1 F (36.7 C)   Resp 20   Ht 5\' 7"  (1.702 m)   Wt 129 kg   LMP  (LMP Unknown)   SpO2 98%   BMI 44.54 kg/m   Physical Exam: General: Intubated, sedated, chronically ill-appearing Cardiovascular: RRR, anasarca present in extremities Respiratory: Intubated on ventilator support Neuro: Sedated  Imaging: I personally reviewed recent imaging.   Assessment & Plan:   Assessment: Patient is a 53 year old female with a past medical history of EtOH cirrhosis/hepatitis, depression, stage II decubitus ulcer present on arrival, and hypertension who was admitted on 08/07/2023 due to ongoing bleeding and increasing confusion. 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 Upon admission, patient noted to have acute hypoxic respiratory failure secondary to pneumonia, acute encephalopathy in the setting of EtOH cirrhosis, acute blood loss anemia, AKI, and septic shock. Patient has received aggressive medical management in the ICU to support this while patient remains on ventilator support. GI and nephrology consulted for recommendations. Palliative medicine team consulted to assist with complex medical decision making.   Recommendations/Plan: # Complex medical decision making/goals of care:   -Patient unable to participate in complex medical decision-making due to current medical status.                -Discussed care with patient's children as detailed above in HPI.  At this time continuing appropriate medical management with hope to optimize as able.  Will not proceed with CRRT as would not change patient's overall outcome and could cause harm; patient would also not be a candidate for long-term dialysis.  Plan for extubation as per PCCM guidance to give patient the "best opportunity".  Once patient is extubated, she will not be  reintubated.  If patient able to protect her airway, will continue medical management while monitoring closely to determine next steps of care.  Should patient deteriorate after extubation, would recommend transition to comfort focused care.  Palliative medicine team continuing to follow along to engage in conversations as able.                -Have already inquired if patient had completed ACP documentation and though this has been discussed, has not been completed. Elizabeth Bush noted patient is not married.  Discussed based on [place] laws if patient does not have healthcare power of attorney and is not married, medical decision making would fall to majority of reasonably available parents and children over the age of 89.           Code Status: Limited: Do not attempt resuscitation (DNR) -DNR-LIMITED -Do Not Intubate/DNI   # Psycho-social/Spiritual Support:  - Support System: 2 daughters, 1 son   # Discharge Planning:  To Be Determined   Discussed with: Patient's 3 children, RN, PCCM provider, nephrologist, gastroenterologist  Thank you for allowing the palliative care team to participate in the care Elizabeth Bush.  Alvester Morin, DO Palliative Care Provider PMT # [phone]  If patient remains symptomatic despite maximum doses, please call PMT at [phone] between 0700 and 1900. Outside of these hours, please call attending, as PMT does not have night coverage.  Personally spent 65 minutes in patient  care including extensive chart review (labs, imaging, progress/consult notes, vital signs), medically appropraite exam, discussed with treatment team, education to patient, family, and staff, documenting clinical information, medication review and management, coordination of care, and available advanced directive documents.

## 2023-08-11 NOTE — Progress Notes (Signed)
 [facility] ROUNDING NOTE   Subjective: Family meeting held today, plans made to transition CODE STATUS to DNR/DNI, and plan for one-way extubation when ready.  Agreed not to proceed with CRRT. Patient noted to have higher residuals of tube feeds as well as distended abdomen and hypoactive bowel sounds, prompting plain film showing what appears to be a dilated loop of colon.  CT ordered.   Objective: Vital signs in last 24 hours: Temp:  [96.6 F (35.9 C)-99.3 F (37.4 C)] 96.6 F (35.9 C) (03/26 1500) Pulse Rate:  [65-86] 65 (03/26 1500) Resp:  [20-24] 20 (03/26 1500) BP: (109-177)/(51-80) 177/80 (03/26 1509) SpO2:  [94 %-100 %] 99 % (03/26 1500) FiO2 (%):  [28 %] 28 % (03/26 1405) Weight:  [129 kg] 129 kg (03/26 0500) Last BM Date : 08/11/23 General: Intubated/sedated, ill-appearing African American female Lungs: CTA b/l, no w/r/r Heart:  RRR, no m/r/g Abdomen: Soft, distended, hypoactive bowel sounds Ext: Anasarca    Intake/Output from previous day: 03/25 0701 - 03/26 0700 In: 2497.2 [I.V.:449.5; Blood:270; NG/GT:888.5; IV Piggyback:859.2] Out: 1110 [Urine:1025; Stool:85] Intake/Output this shift: Total I/O In: 1010.1 [I.V.:117; NG/GT:531.2; IV Piggyback:362] Out: 2105 [Urine:575; Emesis/NG output:900; Stool:630]   Lab Results: Recent Labs    08/09/23 0307 08/09/23 0308 08/10/23 0545 08/10/23 0929 08/10/23 1849  WBC 16.2*  --  12.6*  --  15.3*  HGB 7.2*  --  6.8* 7.5* 7.2*  PLT 132* 146* 109*  --  103*  MCV 86.3  --  88.6  --  89.9   BMET Recent Labs    08/09/23 0307 08/10/23 0545 08/11/23 0533  NA 138 133* 140  K 3.3* 3.0* 3.2*  CL 104 101 105  CO2 24 25 25   GLUCOSE 131* 117* 131*  BUN 17 21* 27*  CREATININE 1.84* 1.93* 2.08*  CALCIUM 8.1* 8.1* 8.9   LFT Recent Labs    08/09/23 0307 08/10/23 0545 08/11/23 0533  PROT 5.0* 4.7* 5.3*  ALBUMIN 2.2* 2.0*  2.0* 2.4*  2.4*  AST 61* 42* 40  ALT 25 23 22   ALKPHOS 60 59 78  BILITOT  6.2* 3.9* 3.3*  BILIDIR 2.9* 1.9* 1.6*  IBILI 3.3* 2.0* 1.7*   PT/INR Recent Labs    08/10/23 0929 08/11/23 0533  INR 2.1* 2.1*      Imaging/Other results: DG Abd 1 View Result Date: 08/11/2023 CLINICAL DATA:  [id number] Nausea AND vomiting [id number] EXAM: ABDOMEN - 1 VIEW COMPARISON:  07/18/2023, 06/20/2023 FINDINGS: Enteric tube terminates in the left upper quadrant. Gaseous distension of a bowel structure in the mid abdomen, right of midline measuring 12.6 cm in diameter. This does not appear to represent the stomach and may represent a dilated segment of transverse colon. No definite small bowel dilation is evident. No gross free intraperitoneal air on portable supine imaging. IMPRESSION: Gaseous distension of a bowel structure in the mid abdomen, right of midline measuring 12.6 cm in diameter. This does not appear to represent the stomach and may represent a dilated segment of transverse colon. Consider CT of the abdomen and pelvis to further assess. Electronically Signed   By: Duanne Guess D.O.   On: 08/11/2023 14:28   ECHOCARDIOGRAM COMPLETE Result Date: 08/10/2023    ECHOCARDIOGRAM REPORT   Patient Name:   SHAKEDA PEARSE Bardales Date of Exam: 08/10/2023 Medical Rec #:  [id number]     Height:       67.0 in Accession #:    [id number]    Weight:  280.9 lb Date of Birth:  1970-11-28      BSA:          2.336 m Patient Age:    53 years      BP:           112/64 mmHg Patient Gender: F             HR:           81 bpm. Exam Location:  Inpatient Procedure: 2D Echo, Cardiac Doppler and Color Doppler (Both Spectral and Color            Flow Doppler were utilized during procedure). Indications:    Shock R57.9  History:        Patient has no prior history of Echocardiogram examinations.                 Arrythmias:Tachycardia; Risk Factors:Hypertension.  Sonographer:    Lucendia Herrlich RCS Referring Phys: [phone] LAURA R GLEASON IMPRESSIONS  1. Left ventricular ejection fraction, by estimation, is 65%. The left  ventricle has normal function. The left ventricle has no regional wall motion abnormalities. Left ventricular diastolic parameters are consistent with Grade I diastolic dysfunction (impaired relaxation).  2. Right ventricular systolic function is normal. The right ventricular size is normal.  3. The mitral valve is normal in structure. No evidence of mitral valve regurgitation. No evidence of mitral stenosis.  4. The aortic valve was not well visualized. Aortic valve regurgitation is not visualized. No aortic stenosis is present. Comparison(s): No prior Echocardiogram. FINDINGS  Left Ventricle: No Strain or 3D seen on study review. Left ventricular ejection fraction, by estimation, is 65%. The left ventricle has normal function. The left ventricle has no regional wall motion abnormalities. Strain was performed and the global longitudinal strain is indeterminate. The left ventricular internal cavity size was normal in size. There is no left ventricular hypertrophy. Left ventricular diastolic parameters are consistent with Grade I diastolic dysfunction (impaired relaxation). Right Ventricle: The right ventricular size is normal. No increase in right ventricular wall thickness. Right ventricular systolic function is normal. Left Atrium: Left atrial size was normal in size. Right Atrium: Right atrial size was normal in size. Pericardium: Trivial pericardial effusion is present. The pericardial effusion is anterior to the right ventricle. Mitral Valve: The mitral valve is normal in structure. No evidence of mitral valve regurgitation. No evidence of mitral valve stenosis. Tricuspid Valve: The tricuspid valve is normal in structure. Tricuspid valve regurgitation is not demonstrated. No evidence of tricuspid stenosis. Aortic Valve: The aortic valve was not well visualized. Aortic valve regurgitation is not visualized. No aortic stenosis is present. Aortic valve mean gradient measures 7.0 mmHg. Aortic valve peak gradient  measures 13.4 mmHg. Aortic valve area, by VTI measures 2.46 cm. Pulmonic Valve: The pulmonic valve was not well visualized. Pulmonic valve regurgitation is not visualized. No evidence of pulmonic stenosis. Aorta: The aortic root and ascending aorta are structurally normal, with no evidence of dilitation. IAS/Shunts: The atrial septum is grossly normal. Additional Comments: 3D was performed not requiring image post processing on an independent workstation and was indeterminate.  LEFT VENTRICLE PLAX 2D LVIDd:         5.00 cm   Diastology LVIDs:         3.30 cm   LV e' medial:    6.09 cm/s LV PW:         0.90 cm   LV E/e' medial:  12.8 LV IVS:  0.80 cm   LV e' lateral:   7.18 cm/s LVOT diam:     2.10 cm   LV E/e' lateral: 10.8 LV SV:         83 LV SV Index:   36 LVOT Area:     3.46 cm  RIGHT VENTRICLE             IVC RV S prime:     15.70 cm/s  IVC diam: 1.90 cm TAPSE (M-mode): 2.4 cm LEFT ATRIUM             Index        RIGHT ATRIUM           Index LA diam:        3.10 cm 1.33 cm/m   RA Area:     12.30 cm LA Vol (A2C):   40.2 ml 17.21 ml/m  RA Volume:   27.80 ml  11.90 ml/m LA Vol (A4C):   33.5 ml 14.34 ml/m LA Biplane Vol: 38.0 ml 16.27 ml/m  AORTIC VALVE AV Area (Vmax):    2.38 cm AV Area (Vmean):   2.28 cm AV Area (VTI):     2.46 cm AV Vmax:           183.00 cm/s AV Vmean:          125.000 cm/s AV VTI:            0.337 m AV Peak Grad:      13.4 mmHg AV Mean Grad:      7.0 mmHg LVOT Vmax:         126.00 cm/s LVOT Vmean:        82.175 cm/s LVOT VTI:          0.240 m LVOT/AV VTI ratio: 0.71  AORTA Ao Root diam: 3.10 cm Ao Asc diam:  3.70 cm MITRAL VALVE               TRICUSPID VALVE MV Area (PHT): 3.50 cm    TR Peak grad:   22.3 mmHg MV Decel Time: 217 msec    TR Vmax:        236.00 cm/s MV E velocity: 77.75 cm/s MV A velocity: 97.65 cm/s  SHUNTS MV E/A ratio:  0.80        Systemic VTI:  0.24 m                            Systemic Diam: 2.10 cm Riley Lam MD Electronically signed by Riley Lam MD Signature Date/Time: 08/10/2023/6:02:03 PM    Final    VAS [place] UPPER EXTREMITY VENOUS DUPLEX Result Date: 08/10/2023 UPPER VENOUS STUDY  Patient Name:  NICKCOLE BRALLEY Javid  Date of Exam:   08/10/2023 Medical Rec #: [id number]      Accession #:    [id number] Date of Birth: 1971/03/24       Patient Gender: F Patient Age:   54 years Exam Location:  [facility] Procedure:      VAS [place] UPPER EXTREMITY VENOUS DUPLEX Referring Phys: Emilie Rutter --------------------------------------------------------------------------------  Indications: Edema Risk Factors: None identified. Limitations: Poor ultrasound/tissue interface, bandages, open wound, line and patient positioning, patient immobility, vent. Comparison Study: No prior studies. Performing Technologist: Chanda Busing RVT  Examination Guidelines: A complete evaluation includes B-mode imaging, spectral Doppler, color Doppler, and power Doppler as needed of all accessible portions of each vessel. Bilateral testing is considered an integral part of a  complete examination. Limited examinations for reoccurring indications may be performed as noted.  Right Findings: +----------+------------+---------+-----------+----------+-------+ RIGHT     CompressiblePhasicitySpontaneousPropertiesSummary +----------+------------+---------+-----------+----------+-------+ IJV           Full       Yes       Yes                      +----------+------------+---------+-----------+----------+-------+ Subclavian    Full       Yes       Yes                      +----------+------------+---------+-----------+----------+-------+ Axillary      Full       Yes       Yes                      +----------+------------+---------+-----------+----------+-------+ Brachial      Full                                          +----------+------------+---------+-----------+----------+-------+ Radial        Full                                           +----------+------------+---------+-----------+----------+-------+ Ulnar         Full                                          +----------+------------+---------+-----------+----------+-------+ Cephalic      Full                                          +----------+------------+---------+-----------+----------+-------+ Basilic       Full                                          +----------+------------+---------+-----------+----------+-------+  Left Findings: +----------+------------+---------+-----------+----------+-------+ LEFT      CompressiblePhasicitySpontaneousPropertiesSummary +----------+------------+---------+-----------+----------+-------+ IJV           Full       Yes       Yes                      +----------+------------+---------+-----------+----------+-------+ Subclavian    Full       Yes       Yes                      +----------+------------+---------+-----------+----------+-------+ Axillary      Full       Yes       Yes                      +----------+------------+---------+-----------+----------+-------+ Brachial      Full                                          +----------+------------+---------+-----------+----------+-------+  Radial        Full                                          +----------+------------+---------+-----------+----------+-------+ Ulnar         Full                                          +----------+------------+---------+-----------+----------+-------+ Cephalic      Full                                          +----------+------------+---------+-----------+----------+-------+ Basilic       Full                                          +----------+------------+---------+-----------+----------+-------+  Summary:  Right: No evidence of deep vein thrombosis in the upper extremity. No evidence of superficial vein thrombosis in the upper extremity.  Left: No evidence of deep vein thrombosis in  the upper extremity. No evidence of superficial vein thrombosis in the upper extremity.  *See table(s) above for measurements and observations.  Diagnosing physician: Carolynn Sayers Electronically signed by Carolynn Sayers on 08/10/2023 at 12:19:16 PM.    Final    MELD 3.0: 27 at 08/11/2023  3:48 PM MELD-Na: 25 at 08/11/2023  3:48 PM Calculated from: Serum Creatinine: 1.88 mg/dL at 1/61/0960  4:54 PM Serum Sodium: 140 mmol/L (Using max of 137 mmol/L) at 08/11/2023  3:48 PM Total Bilirubin: 3.3 mg/dL at 0/98/1191  4:78 AM Serum Albumin: 2.4 g/dL at 2/95/6213  0:86 AM INR(ratio): 2.1 at 08/11/2023  5:33 AM Age at listing (hypothetical): 53 years Sex: Female at 08/11/2023  3:48 PM     Assessment and Plan:  53 year old female with recent diagnosis of severe alcoholic hepatitis treated with prednisolone, readmitted with sepsis secondary to pneumonia and evidence of ongoing liver and renal failure, consistent with hepatorenal syndrome.  Alcoholic liver disease/alcoholic hepatitis Liver parameters have improved, MELD still very high at 27 - Continue prednisolone for now  Profound encephalopathy, likely multifactorial (hepatic, sepsis) - ammonia normalized, would continue current lactulose dosing until off sedation - Patient still showing significant agitation when sedation is lowered, resulting in ongoing need for intubation  Acute on chronic anemia, likely multifactorial - No evidence of active GI bleeding based on stool appearance, patient has had recent upper and lower endoscopy with no obvious bleeding sources other than hemorrhoids - No plans for endoscopy - Continue PPI  Distended abdomen/dilated colon - CT abdomen/pelvis pending - Query whether patient may have colonic pseudoobstruction/ileus - Holding tube feeds  Acute renal failure/hepatorenal syndrome - Patient has made some urine in response to high-dose Lasix, but decision made not to pursue CRRT based on very poor prognosis of  hepatorenal syndrome  Septic shock/ARDS to strep pneumo - Antibiotics per PCCM (ceftriaxone, doxycycline -Shock physiology resolved, midodrine being stopped   Jenel Lucks, MD  08/11/2023, 4:23 PM  Gastroenterology

## 2023-08-11 NOTE — Progress Notes (Addendum)
 Patient ID: Elizabeth Bush, female   DOB: 01/30/1971, 53 y.o.   MRN: [id number] S: Family at the bedside along with Palliative care and PCCM.  Discussed that she does not need dialysis at this time and that she is responding to IV lasix, however would not offer CRRT given likelihood of hepatorenal syndrome and poor prognosis. O:BP 133/77   Pulse 79   Temp (!) 96.8 F (36 C)   Resp 20   Ht 5\' 7"  (1.702 m)   Wt 129 kg   LMP  (LMP Unknown)   SpO2 100%   BMI 44.54 kg/m   Intake/Output Summary (Last 24 hours) at 08/11/2023 1311 Last data filed at 08/11/2023 1200 Gross per 24 hour  Intake 2672.69 ml  Output 1635 ml  Net 1037.69 ml   Intake/Output: I/O last 3 completed shifts: In: 3028.1 [I.V.:549.8; Blood:270; Other:60; UX/LK:4401.0; IV Piggyback:959.2] Out: 1580 [Urine:1375; Stool:205]  Intake/Output this shift:  Total I/O In: 857 [I.V.:104.5; NG/GT:441.2; IV Piggyback:311.3] Out: 630 [Stool:630] Weight change: 1.6 kg UVO:[id number] and sedated CVS: RRR Resp: ventilated BS bilaterally  VQQ:VZDGL +BS, soft, NT Ext: 2+ anasarca  Recent Labs  Lab 08/07/23 1606 08/07/23 1618 08/08/23 0527 08/08/23 0800 08/08/23 1459 08/08/23 1611 08/08/23 1757 08/09/23 0002 08/09/23 0307 08/10/23 0545 08/10/23 1455 08/11/23 0533  NA 132*   < > 134*   < > 138 136 135 136 138 133*  --  140  K 2.8*   < > 3.2*   < > 3.1* 2.9* 2.7* 3.2* 3.3* 3.0*  --  3.2*  CL 106   < > 103   < > 105 103 102 102 104 101  --  105  CO2 12*  --  11*   < > 16* 20* 21* 23 24 25   --  25  GLUCOSE 107*   < > 197*   < > 191* 174* 159* 136* 131* 117*  --  131*  BUN 17   < > 17   < > 16 17 16 17 17  21*  --  27*  CREATININE 1.72*   < > 2.01*   < > 1.79* 1.59* 1.69* 1.78* 1.84* 1.93*  --  2.08*  ALBUMIN 2.0*  --  1.9*  --  2.1* 2.3*  --   --  2.2* 2.0*  2.0*  --  2.4*  2.4*  CALCIUM 7.9*  --  7.5*   < > 7.9* 7.9* 7.8* 8.1* 8.1* 8.1*  --  8.9  PHOS  --   --  5.9*  --  4.8* 4.1  --   --  4.0 3.5 3.6 3.3  3.3  AST 55*  --   72*  --  87*  --   --   --  61* 42*  --  40  ALT 24  --  28  --  29  --   --   --  25 23  --  22   < > = values in this interval not displayed.   Liver Function Tests: Recent Labs  Lab 08/09/23 0307 08/10/23 0545 08/11/23 0533  AST 61* 42* 40  ALT 25 23 22   ALKPHOS 60 59 78  BILITOT 6.2* 3.9* 3.3*  PROT 5.0* 4.7* 5.3*  ALBUMIN 2.2* 2.0*  2.0* 2.4*  2.4*   Recent Labs  Lab 08/09/23 0307  LIPASE 94*  AMYLASE 128*   Recent Labs  Lab 08/07/23 2146 08/09/23 1419 08/10/23 1455  AMMONIA 192* 30 24   CBC: Recent Labs  Lab 08/07/23 1606 08/07/23 1618 08/08/23 0527 08/08/23 1611 08/09/23 0307 08/09/23 0308 08/10/23 0545 08/10/23 0929 08/10/23 1849  WBC 10.8*  --  15.4* 16.9* 16.2*  --  12.6*  --  15.3*  NEUTROABS 8.6*  --   --   --   --   --   --   --   --   HGB 5.0*   < > 6.9* 8.0* 7.2*  --  6.8* 7.5* 7.2*  HCT 15.8*   < > 21.3* 22.9* 20.2*  --  20.3* 22.9* 22.2*  MCV 95.8  --  91.0 87.1 86.3  --  88.6  --  89.9  PLT 187  --  170 132* 132* 146* 109*  --  103*   < > = values in this interval not displayed.   Cardiac Enzymes: No results for input(s): "CKTOTAL", "CKMB", "CKMBINDEX", "TROPONINI" in the last 168 hours. CBG: Recent Labs  Lab 08/10/23 1947 08/11/23 0029 08/11/23 0406 08/11/23 0749 08/11/23 1222  GLUCAP 116* 132* 116* 131* 116*    Iron Studies: No results for input(s): "IRON", "TIBC", "TRANSFERRIN", "FERRITIN" in the last 72 hours. Studies/Results: ECHOCARDIOGRAM COMPLETE Result Date: 08/10/2023    ECHOCARDIOGRAM REPORT   Patient Name:   Elizabeth Bush Date of Exam: 08/10/2023 Medical Rec #:  [id number]     Height:       67.0 in Accession #:    [id number]    Weight:       280.9 lb Date of Birth:  08/23/1970      BSA:          2.336 m Patient Age:    53 years      BP:           112/64 mmHg Patient Gender: F             HR:           81 bpm. Exam Location:  Inpatient Procedure: 2D Echo, Cardiac Doppler and Color Doppler (Both Spectral and Color             Flow Doppler were utilized during procedure). Indications:    Shock R57.9  History:        Patient has no prior history of Echocardiogram examinations.                 Arrythmias:Tachycardia; Risk Factors:Hypertension.  Sonographer:    Lucendia Herrlich RCS Referring Phys: [phone] LAURA R GLEASON IMPRESSIONS  1. Left ventricular ejection fraction, by estimation, is 65%. The left ventricle has normal function. The left ventricle has no regional wall motion abnormalities. Left ventricular diastolic parameters are consistent with Grade I diastolic dysfunction (impaired relaxation).  2. Right ventricular systolic function is normal. The right ventricular size is normal.  3. The mitral valve is normal in structure. No evidence of mitral valve regurgitation. No evidence of mitral stenosis.  4. The aortic valve was not well visualized. Aortic valve regurgitation is not visualized. No aortic stenosis is present. Comparison(s): No prior Echocardiogram. FINDINGS  Left Ventricle: No Strain or 3D seen on study review. Left ventricular ejection fraction, by estimation, is 65%. The left ventricle has normal function. The left ventricle has no regional wall motion abnormalities. Strain was performed and the global longitudinal strain is indeterminate. The left ventricular internal cavity size was normal in size. There is no left ventricular hypertrophy. Left ventricular diastolic parameters are consistent with Grade I diastolic dysfunction (impaired relaxation). Right Ventricle: The right ventricular size is normal. No  increase in right ventricular wall thickness. Right ventricular systolic function is normal. Left Atrium: Left atrial size was normal in size. Right Atrium: Right atrial size was normal in size. Pericardium: Trivial pericardial effusion is present. The pericardial effusion is anterior to the right ventricle. Mitral Valve: The mitral valve is normal in structure. No evidence of mitral valve regurgitation. No evidence  of mitral valve stenosis. Tricuspid Valve: The tricuspid valve is normal in structure. Tricuspid valve regurgitation is not demonstrated. No evidence of tricuspid stenosis. Aortic Valve: The aortic valve was not well visualized. Aortic valve regurgitation is not visualized. No aortic stenosis is present. Aortic valve mean gradient measures 7.0 mmHg. Aortic valve peak gradient measures 13.4 mmHg. Aortic valve area, by VTI measures 2.46 cm. Pulmonic Valve: The pulmonic valve was not well visualized. Pulmonic valve regurgitation is not visualized. No evidence of pulmonic stenosis. Aorta: The aortic root and ascending aorta are structurally normal, with no evidence of dilitation. IAS/Shunts: The atrial septum is grossly normal. Additional Comments: 3D was performed not requiring image post processing on an independent workstation and was indeterminate.  LEFT VENTRICLE PLAX 2D LVIDd:         5.00 cm   Diastology LVIDs:         3.30 cm   LV e' medial:    6.09 cm/s LV PW:         0.90 cm   LV E/e' medial:  12.8 LV IVS:        0.80 cm   LV e' lateral:   7.18 cm/s LVOT diam:     2.10 cm   LV E/e' lateral: 10.8 LV SV:         83 LV SV Index:   36 LVOT Area:     3.46 cm  RIGHT VENTRICLE             IVC RV S prime:     15.70 cm/s  IVC diam: 1.90 cm TAPSE (M-mode): 2.4 cm LEFT ATRIUM             Index        RIGHT ATRIUM           Index LA diam:        3.10 cm 1.33 cm/m   RA Area:     12.30 cm LA Vol (A2C):   40.2 ml 17.21 ml/m  RA Volume:   27.80 ml  11.90 ml/m LA Vol (A4C):   33.5 ml 14.34 ml/m LA Biplane Vol: 38.0 ml 16.27 ml/m  AORTIC VALVE AV Area (Vmax):    2.38 cm AV Area (Vmean):   2.28 cm AV Area (VTI):     2.46 cm AV Vmax:           183.00 cm/s AV Vmean:          125.000 cm/s AV VTI:            0.337 m AV Peak Grad:      13.4 mmHg AV Mean Grad:      7.0 mmHg LVOT Vmax:         126.00 cm/s LVOT Vmean:        82.175 cm/s LVOT VTI:          0.240 m LVOT/AV VTI ratio: 0.71  AORTA Ao Root diam: 3.10 cm Ao Asc  diam:  3.70 cm MITRAL VALVE               TRICUSPID VALVE MV Area (PHT): 3.50 cm  TR Peak grad:   22.3 mmHg MV Decel Time: 217 msec    TR Vmax:        236.00 cm/s MV E velocity: 77.75 cm/s MV A velocity: 97.65 cm/s  SHUNTS MV E/A ratio:  0.80        Systemic VTI:  0.24 m                            Systemic Diam: 2.10 cm Riley Lam MD Electronically signed by Riley Lam MD Signature Date/Time: 08/10/2023/6:02:03 PM    Final    VAS [place] UPPER EXTREMITY VENOUS DUPLEX Result Date: 08/10/2023 UPPER VENOUS STUDY  Patient Name:  TARSHA BLANDO Capitano  Date of Exam:   08/10/2023 Medical Rec #: [id number]      Accession #:    [id number] Date of Birth: [birth year]       Patient Gender: F Patient Age:   46 years Exam Location:  [facility] Procedure:      VAS [place] UPPER EXTREMITY VENOUS DUPLEX Referring Phys: Emilie Rutter --------------------------------------------------------------------------------  Indications: Edema Risk Factors: None identified. Limitations: Poor ultrasound/tissue interface, bandages, open wound, line and patient positioning, patient immobility, vent. Comparison Study: No prior studies. Performing Technologist: Chanda Busing RVT  Examination Guidelines: A complete evaluation includes B-mode imaging, spectral Doppler, color Doppler, and power Doppler as needed of all accessible portions of each vessel. Bilateral testing is considered an integral part of a complete examination. Limited examinations for reoccurring indications may be performed as noted.  Right Findings: +----------+------------+---------+-----------+----------+-------+ RIGHT     CompressiblePhasicitySpontaneousPropertiesSummary +----------+------------+---------+-----------+----------+-------+ IJV           Full       Yes       Yes                      +----------+------------+---------+-----------+----------+-------+ Subclavian    Full       Yes       Yes                       +----------+------------+---------+-----------+----------+-------+ Axillary      Full       Yes       Yes                      +----------+------------+---------+-----------+----------+-------+ Brachial      Full                                          +----------+------------+---------+-----------+----------+-------+ Radial        Full                                          +----------+------------+---------+-----------+----------+-------+ Ulnar         Full                                          +----------+------------+---------+-----------+----------+-------+ Cephalic      Full                                          +----------+------------+---------+-----------+----------+-------+  Basilic       Full                                          +----------+------------+---------+-----------+----------+-------+  Left Findings: +----------+------------+---------+-----------+----------+-------+ LEFT      CompressiblePhasicitySpontaneousPropertiesSummary +----------+------------+---------+-----------+----------+-------+ IJV           Full       Yes       Yes                      +----------+------------+---------+-----------+----------+-------+ Subclavian    Full       Yes       Yes                      +----------+------------+---------+-----------+----------+-------+ Axillary      Full       Yes       Yes                      +----------+------------+---------+-----------+----------+-------+ Brachial      Full                                          +----------+------------+---------+-----------+----------+-------+ Radial        Full                                          +----------+------------+---------+-----------+----------+-------+ Ulnar         Full                                          +----------+------------+---------+-----------+----------+-------+ Cephalic      Full                                           +----------+------------+---------+-----------+----------+-------+ Basilic       Full                                          +----------+------------+---------+-----------+----------+-------+  Summary:  Right: No evidence of deep vein thrombosis in the upper extremity. No evidence of superficial vein thrombosis in the upper extremity.  Left: No evidence of deep vein thrombosis in the upper extremity. No evidence of superficial vein thrombosis in the upper extremity.  *See table(s) above for measurements and observations.  Diagnosing physician: Carolynn Sayers Electronically signed by Carolynn Sayers on 08/10/2023 at 12:19:16 PM.    Final     Chlorhexidine Gluconate Cloth  6 each Topical Daily   feeding supplement (PROSource TF20)  60 mL Per Tube Daily   insulin aspart  0-15 Units Subcutaneous Q4H   lactulose  30 g Per Tube QID   midodrine  10 mg Per Tube TID WC   mouth rinse  15 mL Mouth Rinse Q2H   pantoprazole (PROTONIX) IV  40 mg Intravenous Q12H   prednisoLONE  40 mg Per Tube Daily  sodium chloride flush  10-40 mL Intracatheter Q12H    BMET    Component Value Date/Time   NA 140 08/11/2023 0533   NA 137 01/14/2023 1138   K 3.2 (L) 08/11/2023 0533   CL 105 08/11/2023 0533   CO2 25 08/11/2023 0533   GLUCOSE 131 (H) 08/11/2023 0533   BUN 27 (H) 08/11/2023 0533   BUN 8 01/14/2023 1138   CREATININE 2.08 (H) 08/11/2023 0533   CREATININE 0.68 04/21/2021 1350   CALCIUM 8.9 08/11/2023 0533   GFRNONAA 28 (L) 08/11/2023 0533   GFRNONAA >60 04/21/2021 1350   GFRAA 122 09/15/2019 0909   CBC    Component Value Date/Time   WBC 15.3 (H) 08/10/2023 1849   RBC 2.47 (L) 08/10/2023 1849   HGB 7.2 (L) 08/10/2023 1849   HGB 10.0 (L) 02/16/2023 1122   HCT 22.2 (L) 08/10/2023 1849   HCT 30.8 (L) 02/16/2023 1122   PLT 103 (L) 08/10/2023 1849   PLT CANCELED 02/16/2023 1122   MCV 89.9 08/10/2023 1849   MCV 96 02/16/2023 1122   MCH 29.1 08/10/2023 1849   MCHC 32.4 08/10/2023 1849    RDW 20.3 (H) 08/10/2023 1849   RDW 12.1 02/16/2023 1122   LYMPHSABS 1.0 08/07/2023 1606   MONOABS 1.0 08/07/2023 1606   EOSABS 0.1 08/07/2023 1606   BASOSABS 0.0 08/07/2023 1606    Assessment/Plan:   AKI/CKD stage IIIb - presumably ischemic ATN in setting of sepsis, significant anemia, and possible hepatorenal syndrome.  Renal US without obstruction.  UNa 12 so HRS is possible.  She was for possible CRRT at time of admission, however her clinical picture significantly improved after IVF boluses and IV albumin.  She was taken off of pressors and her acidosis improved.  She is responding to high dose IV lasix and IV albumin with improved UOP and stable BUN/CR.  I was present at the family meeting and agree no CRRT.  Will manage conservatively.  Nothing further to add, will sign off.  Please call with questions or concerns.  Avoid nephrotoxic medications including NSAIDs and iodinated intravenous contrast exposure unless the latter is absolutely indicated.   Preferred narcotic agents for pain control are hydromorphone, fentanyl, and methadone. Morphine should not be used.  Avoid Baclofen and avoid oral sodium phosphate and magnesium citrate based laxatives / bowel preps.  Continue strict Input and Output monitoring. Will monitor the patient closely with you and intervene or adjust therapy as indicated by changes in clinical status/labs   Circulatory shock - in setting of strep pneumonia.  Off of levophed at this time and did respond to blood transfusions. AGMA - due to #1 improved VDRF - intubated in ED due to AMS and CXR with possible aspiration or HAP.  Per PCCM. 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 ABLA - s/p transfusion with improvement of Hgb and Bp. Acute encephalopathy - possibly due to critical illness or hepatic encephalopathy.  Continue with lactulose and follow.   Irena Cords, MD [employer] [phone]
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
## 2023-08-11 NOTE — Progress Notes (Addendum)
 Interim progress note:  Pt started vomiting TF, KUB with concern for dilated colon. CT abd/pelvis noted as below.  Pt also had a small amount of blood from OG, hgb stable at 7.7.  continue Protonix, GI following, trend hemoglobin and transfuse as needed.  Family updated at the bedside. K 3.0, have ordered IV and added on magnesium level   IMPRESSION: 1. Small bilateral pleural effusions. Airspace opacities in both lower lobes, the lingula, and the right middle lobe, cannot exclude multilobar pneumonia. 2. Moderate gaseous distention of the cecum with wandering cecum but no findings of volvulus or obstruction. Otherwise no dilated bowel. 3. Mild cardiomegaly. 4. Low-density blood pool suggests anemia. 5. Prominent caudate lobe and lateral segment left hepatic lobe suspicious for cirrhosis morphology. 6. Moderate ascites. Diffuse subcutaneous and flank edema. Mesenteric edema. Appearance favors third spacing of fluids. 7. High density in the gallbladder possibilities including gallstones, sludge, or vicarious contrast excretion from a recent contrast administration. 8. Descending and sigmoid colon diverticulosis without findings of active diverticulitis. 9. Uterine masses favor fibroids. 10. Lower lumbar spondylosis and degenerative disc disease with suspected impingement at L2-3, L3-4, and especially L4-5.  Darcella Gasman Hawk Mones, PA-C

## 2023-08-11 NOTE — Progress Notes (Signed)
 Pt transported on ventilator to CT and back to room 1230. Vitals stable throughout. No distress noted during transport. No complications.  RN at bedside. Will continue to monitor.

## 2023-08-11 NOTE — Plan of Care (Signed)
  Problem: Metabolic: Goal: Ability to maintain appropriate glucose levels will improve Outcome: Progressing   Problem: Clinical Measurements: Goal: Cardiovascular complication will be avoided Outcome: Progressing   Problem: Fluid Volume: Goal: Ability to maintain a balanced intake and output will improve Outcome: Not Progressing   Problem: Skin Integrity: Goal: Risk for impaired skin integrity will decrease Outcome: Not Progressing   Problem: Clinical Measurements: Goal: Respiratory complications will improve Outcome: Not Progressing   Problem: Activity: Goal: Risk for activity intolerance will decrease Outcome: Not Progressing   Problem: Coping: Goal: Level of anxiety will decrease Outcome: Not Progressing   Problem: Skin Integrity: Goal: Risk for impaired skin integrity will decrease Outcome: Not Progressing   Problem: Safety: Goal: Non-violent Restraint(s) Outcome: Not Progressing

## 2023-08-12 ENCOUNTER — Inpatient Hospital Stay: Payer: Medicaid Other | Admitting: Internal Medicine

## 2023-08-12 DIAGNOSIS — K7682 Hepatic encephalopathy: Secondary | ICD-10-CM | POA: Diagnosis not present

## 2023-08-12 DIAGNOSIS — Z515 Encounter for palliative care: Secondary | ICD-10-CM | POA: Diagnosis not present

## 2023-08-12 DIAGNOSIS — Z66 Do not resuscitate: Secondary | ICD-10-CM | POA: Diagnosis not present

## 2023-08-12 DIAGNOSIS — K7031 Alcoholic cirrhosis of liver with ascites: Secondary | ICD-10-CM | POA: Diagnosis not present

## 2023-08-12 DIAGNOSIS — Z7189 Other specified counseling: Secondary | ICD-10-CM | POA: Diagnosis not present

## 2023-08-12 DIAGNOSIS — R6521 Severe sepsis with septic shock: Secondary | ICD-10-CM | POA: Diagnosis not present

## 2023-08-12 DIAGNOSIS — K7290 Hepatic failure, unspecified without coma: Secondary | ICD-10-CM | POA: Diagnosis not present

## 2023-08-12 LAB — CBC WITH DIFFERENTIAL/PLATELET
Abs Immature Granulocytes: 0.23 10*3/uL — ABNORMAL HIGH (ref 0.00–0.07)
Basophils Absolute: 0 10*3/uL (ref 0.0–0.1)
Basophils Relative: 0 %
Eosinophils Absolute: 0 10*3/uL (ref 0.0–0.5)
Eosinophils Relative: 0 %
HCT: 26 % — ABNORMAL LOW (ref 36.0–46.0)
Hemoglobin: 8.5 g/dL — ABNORMAL LOW (ref 12.0–15.0)
Immature Granulocytes: 2 %
Lymphocytes Relative: 15 %
Lymphs Abs: 2.4 10*3/uL (ref 0.7–4.0)
MCH: 28.7 pg (ref 26.0–34.0)
MCHC: 32.7 g/dL (ref 30.0–36.0)
MCV: 87.8 fL (ref 80.0–100.0)
Monocytes Absolute: 1.2 10*3/uL — ABNORMAL HIGH (ref 0.1–1.0)
Monocytes Relative: 8 %
Neutro Abs: 11.9 10*3/uL — ABNORMAL HIGH (ref 1.7–7.7)
Neutrophils Relative %: 75 %
Platelets: 99 10*3/uL — ABNORMAL LOW (ref 150–400)
RBC: 2.96 MIL/uL — ABNORMAL LOW (ref 3.87–5.11)
RDW: 19.8 % — ABNORMAL HIGH (ref 11.5–15.5)
WBC: 15.7 10*3/uL — ABNORMAL HIGH (ref 4.0–10.5)
nRBC: 0.3 % — ABNORMAL HIGH (ref 0.0–0.2)

## 2023-08-12 LAB — RENAL FUNCTION PANEL
Albumin: 2.3 g/dL — ABNORMAL LOW (ref 3.5–5.0)
Anion gap: 11 (ref 5–15)
BUN: 31 mg/dL — ABNORMAL HIGH (ref 6–20)
CO2: 23 mmol/L (ref 22–32)
Calcium: 9.1 mg/dL (ref 8.9–10.3)
Chloride: 108 mmol/L (ref 98–111)
Creatinine, Ser: 1.87 mg/dL — ABNORMAL HIGH (ref 0.44–1.00)
GFR, Estimated: 32 mL/min — ABNORMAL LOW (ref >60)
Glucose, Bld: 131 mg/dL — ABNORMAL HIGH (ref 70–99)
Phosphorus: 3.4 mg/dL (ref 2.5–4.6)
Potassium: 3.5 mmol/L (ref 3.5–5.1)
Sodium: 142 mmol/L (ref 135–145)

## 2023-08-12 LAB — BLOOD GAS, ARTERIAL
Acid-Base Excess: 5.9 mmol/L — ABNORMAL HIGH (ref 0.0–2.0)
Bicarbonate: 27.2 mmol/L (ref 20.0–28.0)
Drawn by: 31394
FIO2: 28 %
MECHVT: 490 mL
O2 Saturation: 97.9 %
PEEP: 5 cmH2O
Patient temperature: 35.6
RATE: 20 {breaths}/min
pCO2 arterial: 27 mmHg — ABNORMAL LOW (ref 32–48)
pH, Arterial: 7.6 — ABNORMAL HIGH (ref 7.35–7.45)
pO2, Arterial: 69 mmHg — ABNORMAL LOW (ref 83–108)

## 2023-08-12 LAB — CULTURE, BLOOD (ROUTINE X 2)
Culture: NO GROWTH
Special Requests: ADEQUATE

## 2023-08-12 LAB — LACTATE DEHYDROGENASE, PLEURAL OR PERITONEAL FLUID: LD, Fluid: 86 U/L — ABNORMAL HIGH (ref 3–23)

## 2023-08-12 LAB — HEPATIC FUNCTION PANEL
ALT: 27 U/L (ref 0–44)
AST: 42 U/L — ABNORMAL HIGH (ref 15–41)
Albumin: 2.3 g/dL — ABNORMAL LOW (ref 3.5–5.0)
Alkaline Phosphatase: 89 U/L (ref 38–126)
Bilirubin, Direct: 1.3 mg/dL — ABNORMAL HIGH (ref 0.0–0.2)
Indirect Bilirubin: 1.7 mg/dL — ABNORMAL HIGH (ref 0.3–0.9)
Total Bilirubin: 3 mg/dL — ABNORMAL HIGH (ref 0.0–1.2)
Total Protein: 5.6 g/dL — ABNORMAL LOW (ref 6.5–8.1)

## 2023-08-12 LAB — BODY FLUID CELL COUNT WITH DIFFERENTIAL
Eos, Fluid: 0 %
Lymphs, Fluid: 31 %
Monocyte-Macrophage-Serous Fluid: 36 % — ABNORMAL LOW (ref 50–90)
Neutrophil Count, Fluid: 33 % — ABNORMAL HIGH (ref 0–25)
Total Nucleated Cell Count, Fluid: 12 uL (ref 0–1000)

## 2023-08-12 LAB — PHOSPHORUS: Phosphorus: 3.3 mg/dL (ref 2.5–4.6)

## 2023-08-12 LAB — BASIC METABOLIC PANEL WITH GFR
Anion gap: 9 (ref 5–15)
BUN: 32 mg/dL — ABNORMAL HIGH (ref 6–20)
CO2: 26 mmol/L (ref 22–32)
Calcium: 9.1 mg/dL (ref 8.9–10.3)
Chloride: 107 mmol/L (ref 98–111)
Creatinine, Ser: 1.87 mg/dL — ABNORMAL HIGH (ref 0.44–1.00)
GFR, Estimated: 32 mL/min — ABNORMAL LOW (ref >60)
Glucose, Bld: 135 mg/dL — ABNORMAL HIGH (ref 70–99)
Potassium: 3.6 mmol/L (ref 3.5–5.1)
Sodium: 142 mmol/L (ref 135–145)

## 2023-08-12 LAB — GLUCOSE, CAPILLARY
Glucose-Capillary: 106 mg/dL — ABNORMAL HIGH (ref 70–99)
Glucose-Capillary: 115 mg/dL — ABNORMAL HIGH (ref 70–99)
Glucose-Capillary: 119 mg/dL — ABNORMAL HIGH (ref 70–99)
Glucose-Capillary: 122 mg/dL — ABNORMAL HIGH (ref 70–99)
Glucose-Capillary: 128 mg/dL — ABNORMAL HIGH (ref 70–99)
Glucose-Capillary: 128 mg/dL — ABNORMAL HIGH (ref 70–99)
Glucose-Capillary: 129 mg/dL — ABNORMAL HIGH (ref 70–99)
Glucose-Capillary: 132 mg/dL — ABNORMAL HIGH (ref 70–99)

## 2023-08-12 LAB — PROTEIN, PLEURAL OR PERITONEAL FLUID: Total protein, fluid: <3 g/dL

## 2023-08-12 LAB — MAGNESIUM: Magnesium: 1.8 mg/dL (ref 1.7–2.4)

## 2023-08-12 LAB — LACTATE DEHYDROGENASE: LDH: 291 U/L — ABNORMAL HIGH (ref 98–192)

## 2023-08-12 LAB — ALBUMIN, PLEURAL OR PERITONEAL FLUID: Albumin, Fluid: <1.5 g/dL

## 2023-08-12 MED ORDER — ALBUMIN HUMAN 25 % IV SOLN
50.0000 g | Freq: Once | INTRAVENOUS | Status: AC
  Administered 2023-08-12: 50 g via INTRAVENOUS
  Filled 2023-08-12: qty 200

## 2023-08-12 MED ORDER — NOREPINEPHRINE 4 MG/250ML-% IV SOLN: 2.0000 ug/min | INTRAVENOUS | Status: DC

## 2023-08-12 MED ORDER — MAGNESIUM SULFATE 2 GM/50ML IV SOLN
2.0000 g | Freq: Once | INTRAVENOUS | Status: AC
  Administered 2023-08-12: 2 g via INTRAVENOUS
  Filled 2023-08-12: qty 50

## 2023-08-12 MED ORDER — SODIUM CHLORIDE 0.9 % IV SOLN
250.0000 mL | INTRAVENOUS | Status: AC
  Administered 2023-08-12: 250 mL via INTRAVENOUS

## 2023-08-12 MED ORDER — POTASSIUM CHLORIDE 10 MEQ/50ML IV SOLN
10.0000 meq | INTRAVENOUS | Status: AC
  Administered 2023-08-12 (×4): 10 meq via INTRAVENOUS
  Filled 2023-08-12 (×4): qty 50

## 2023-08-12 MED ORDER — NOREPINEPHRINE 4 MG/250ML-% IV SOLN
0.0000 ug/min | INTRAVENOUS | Status: DC
Start: 2023-08-12 — End: 2023-08-13
  Administered 2023-08-12: 2 ug/min via INTRAVENOUS
  Filled 2023-08-12: qty 250

## 2023-08-12 NOTE — Procedures (Signed)
 Paracentesis Procedure Note  MISAO FACKRELL  [id number]  1970-10-27  Date:08/12/23  Time:10:14 AM   Provider Performing:Marylu Dudenhoeffer V. Eleazar Kimmey    Procedure: Paracentesis with imaging guidance ([id number])  Indication(s) Ascites  Consent Risks of the procedure as well as the alternatives and risks of each were explained to the patient and/or caregiver.  Consent for the procedure was obtained and is signed in the bedside chart  Anesthesia Topical only with 1% lidocaine    Time Out Verified patient identification, verified procedure, site/side was marked, verified correct patient position, special equipment/implants available, medications/allergies/relevant history reviewed, required imaging and test results available.   Sterile Technique Maximal sterile technique including full sterile barrier drape, hand hygiene, sterile gown, sterile gloves, mask, hair covering, sterile ultrasound probe cover (if used).   Procedure Description Ultrasound used to identify appropriate peritoneal anatomy for placement and overlying skin marked.  Area of drainage cleaned and draped in sterile fashion. Lidocaine was used to anesthetize the skin and subcutaneous tissue.  2000 cc's of pale yellow appearing fluid was drained. Catheter then removed and bandaid applied to site.   Complications/Tolerance None; patient tolerated the procedure well.   EBL Minimal   Specimen(s) Peritoneal fluid  Leopoldo Mazzie V. Vassie Loll MD

## 2023-08-12 NOTE — Progress Notes (Signed)
 Daily Progress Note   Patient Name: Elizabeth Bush       Date: 08/12/2023 DOB: Apr 24, 1971  Age: 53 y.o. MRN#: [id number] Attending Physician: Oretha Milch, MD Primary Care Physician: Marcine Matar, MD Admit Date: 08/07/2023 Length of Stay: 5 days  Reason for Consultation/Follow-up: Establishing goals of care  Subjective:   CC: Patient remains intubated and sedated.  Following up regarding complex medical decision making.  Subjective:  Reviewed EMR prior to presenting to bedside.  Reviewed CMP noting creatinine downtrending to 1.87 with GFR of 32.  Bilirubin also noted to be downtrending to 3.  Leukocytosis remains elevated at 15.7 and platelet count decreased to 99. Reviewed new CT abdomen pelvis without contrast.  Discussed care with PCCM providers.  Patient was changed to Precedex overnight.  Patient's mentation still poor at this time.  When initially presenting to see patient in a.m., patient undergoing paracentesis for ascites management.  Family was present at bedside though did not interrupt procedure.  Represented to bedside later in afternoon.  No family noted at bedside at that time.  Again discussed care with PCCM provider.  At this time continuing current medical management.  Patient will not be extubated today.  Continue optimization to allow best chance of patient being able to support herself after ventilator support. Goals for care remain clear at this time with continuing appropriate medical management.  Continuing to monitor closely for decompensation.  Objective:   Vital Signs:  BP (!) 143/77   Pulse (!) 53   Temp (!) 97.3 F (36.3 C) (Oral)   Resp 20   Ht 5\' 7"  (1.702 m)   Wt 123.1 kg   LMP  (LMP Unknown)   SpO2 97%   BMI 42.51 kg/m   Physical Exam: General: Intubated, sedated, chronically ill-appearing Cardiovascular: Bradycardia noted, anasarca present in extremities Respiratory: Intubated on ventilator support Neuro: Sedated  Imaging: I  personally reviewed recent imaging.   Assessment & Plan:   Assessment: Patient is a 53 year old female with a past medical history of EtOH cirrhosis/hepatitis, depression, stage II decubitus ulcer present on arrival, and hypertension who was admitted on 08/07/2023 due to ongoing bleeding and increasing confusion. 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 Upon admission, patient noted to have acute hypoxic respiratory failure secondary to pneumonia, acute encephalopathy in the setting of EtOH cirrhosis, acute blood loss anemia, AKI, and septic shock. Patient has received aggressive medical management in the ICU to support this while patient remains on ventilator support. GI and nephrology consulted for recommendations. Palliative medicine team consulted to assist with complex medical decision making.   Recommendations/Plan: # Complex medical decision making/goals of care:   -Patient unable to participate in complex medical decision-making due to current medical status.                -Discussed care with patient's 3 children on 08/11/2023.  Please see prior note for full discussion.  No family present at bedside today when able to visit.  At this time continuing appropriate medical management with hope to optimize as able.  Will not proceed with CRRT as would not change patient's overall outcome and could cause harm; patient would also not be a candidate for long-term dialysis.  Plan for extubation as per PCCM guidance to give patient the "best opportunity".  Once patient is extubated, she will not be reintubated.  If patient  able to protect her airway, will continue medical management while monitoring closely to determine next steps of care.  Should patient deteriorate after extubation, would recommend transition to comfort focused care.  Palliative medicine team  continuing to follow along to engage in conversations as able.                -Have already inquired if patient had completed ACP documentation and though this has been discussed, has not been completed. [name] noted patient is not married.  Discussed based on [place] laws if patient does not have healthcare power of attorney and is not married, medical decision making would fall to majority of reasonably available parents and children over the age of 10.           Code Status: Limited: Do not attempt resuscitation (DNR) -DNR-LIMITED -Do Not Intubate/DNI   # Psycho-social/Spiritual Support:  - Support System: 2 daughters, 1 son   # Discharge Planning:  To Be Determined   Discussed with: Patient's 3 children, RN, PCCM provider, nephrologist, gastroenterologist  Thank you for allowing the palliative care team to participate in the care Darci M Angulo.  Alvester Morin, DO Palliative Care Provider PMT # [phone]  If patient remains symptomatic despite maximum doses, please call PMT at [phone] between 0700 and 1900. Outside of these hours, please call attending, as PMT does not have night coverage.  Personally spent 35 minutes in patient care including extensive chart review (labs, imaging, progress/consult notes, vital signs), medically appropraite exam, discussed with treatment team, education to patient, family, and staff, documenting clinical information, medication review and management, coordination of care, and available advanced directive documents.

## 2023-08-12 NOTE — Progress Notes (Signed)
 [facility] Progress Note  CC:   ETOH liver disease, HE   Subjective:  No sign of GI bleeding.  Code status was changed yesterday.  Had 2 Liter paracentesis today.  Objective:  Vital signs in last 24 hours: Temp:  [96.1 F (35.6 C)-98.2 F (36.8 C)] 96.1 F (35.6 C) (03/27 1200) Pulse Rate:  [48-81] 48 (03/27 1200) Resp:  [20] 20 (03/27 1200) BP: (106-184)/(61-87) 126/76 (03/27 1200) SpO2:  [94 %-100 %] 99 % (03/27 1200) FiO2 (%):  [28 %] 28 % (03/27 1200) Weight:  [123.1 kg] 123.1 kg (03/27 0401) Last BM Date : 08/11/23 General:  Intubated and sedated. Heart:  Bradycardic Pulm:  Mechanical breath sounds noted. Abdomen:  Soft, less distended after paracentesis.  BS present. Extremities:  Diffuse edema/anasarca.  Intake/Output from previous day: 03/26 0701 - 03/27 0700 In: 2270.5 [I.V.:379.4; NG/GT:591.2; IV Piggyback:1299.9] Out: 0981 [Urine:4375; Emesis/NG output:1350; Stool:890] Intake/Output this shift: Total I/O In: 194 [I.V.:8.7; IV Piggyback:185.3] Out: 900 [Urine:900]  Lab Results: Recent Labs    08/10/23 1849 08/11/23 1548 08/12/23 0310  WBC 15.3* 16.7* 15.7*  HGB 7.2* 7.7* 8.5*  HCT 22.2* 24.6* 26.0*  PLT 103* 102* 99*   BMET Recent Labs    08/11/23 0533 08/11/23 1548 08/12/23 0310  NA 140 140 142  K 3.2* 3.0* 3.5  CL 105 105 108  CO2 25 24 23   GLUCOSE 131* 134* 131*  BUN 27* 27* 31*  CREATININE 2.08* 1.88* 1.87*  CALCIUM 8.9 9.0 9.1   LFT Recent Labs    08/12/23 0310  PROT 5.6*  ALBUMIN 2.3*  2.3*  AST 42*  ALT 27  ALKPHOS 89  BILITOT 3.0*  BILIDIR 1.3*  IBILI 1.7*   PT/INR Recent Labs    08/10/23 0929 08/11/23 0533  LABPROT 23.8* 23.5*  INR 2.1* 2.1*   CT ABDOMEN PELVIS WO CONTRAST Result Date: 08/11/2023 CLINICAL DATA:  Suspected bowel obstruction EXAM: CT ABDOMEN AND PELVIS WITHOUT CONTRAST TECHNIQUE: Multidetector CT imaging of the abdomen and pelvis was performed following the standard protocol  without IV contrast. RADIATION DOSE REDUCTION: This exam was performed according to the departmental dose-optimization program which includes automated exposure control, adjustment of the mA and/or kV according to patient size and/or use of iterative reconstruction technique. COMPARISON:  06/20/2023 FINDINGS: Lower chest: Small bilateral pleural effusions. Airspace opacities in both lower lobes, the lingula, and the right middle lobe, cannot exclude multilobar pneumonia. Mild cardiomegaly. Nasogastric tube terminates in the stomach body. Low-density blood pool suggests anemia. Hepatobiliary: Prominent caudate lobe and lateral segment left hepatic lobe suspicious for cirrhosis morphology. High density in the gallbladder possibilities including gallstones, sludge, or vicarious contrast excretion from a recent contrast administration. Pancreas: Peripancreatic edema is not necessarily disproportionate to edema elsewhere along the mesentery and subcutaneous tissues. Correlate with lipase levels in assessing for pancreatitis, but imaging findings are not considered highly suspicious for pancreatitis. Spleen: Unremarkable Adrenals/Urinary Tract: Bilateral renal cysts. No further imaging workup of these lesions is indicated. Adrenal glands unremarkable. Foley catheter in the urinary bladder. Stomach/Bowel: Dilute oral contrast medium in the stomach. Fecal management system noted. Descending and sigmoid colon diverticulosis without findings of active diverticulitis. Mobile cecum demonstrating mild gaseous distension but no twisted appearance or overt volvulus. No dilated bowel to further indicate obstruction. Vascular/Lymphatic: Unremarkable Reproductive: Uterine masses favor fibroids. Appearance similar to prior. These are primarily eccentric along the right fundus. Other: Moderate ascites. Diffuse subcutaneous and flank edema. Mesenteric edema noted. Appearance favors third spacing  of fluids. Musculoskeletal: Old deformity  of the left acetabulum from remote fracture. Grade 1 degenerative anterolisthesis at L3-4 and L4-5 with lower lumbar spondylosis and degenerative disc disease with suspected impingement at L2-3, L3-4, and especially L4-5. IMPRESSION: 1. Small bilateral pleural effusions. Airspace opacities in both lower lobes, the lingula, and the right middle lobe, cannot exclude multilobar pneumonia. 2. Moderate gaseous distention of the cecum with wandering cecum but no findings of volvulus or obstruction. Otherwise no dilated bowel. 3. Mild cardiomegaly. 4. Low-density blood pool suggests anemia. 5. Prominent caudate lobe and lateral segment left hepatic lobe suspicious for cirrhosis morphology. 6. Moderate ascites. Diffuse subcutaneous and flank edema. Mesenteric edema. Appearance favors third spacing of fluids. 7. High density in the gallbladder possibilities including gallstones, sludge, or vicarious contrast excretion from a recent contrast administration. 8. Descending and sigmoid colon diverticulosis without findings of active diverticulitis. 9. Uterine masses favor fibroids. 10. Lower lumbar spondylosis and degenerative disc disease with suspected impingement at L2-3, L3-4, and especially L4-5. Electronically Signed   By: Gaylyn Rong M.D.   On: 08/11/2023 18:21   DG Abd 1 View Result Date: 08/11/2023 CLINICAL DATA:  [id number] Nausea AND vomiting [id number] EXAM: ABDOMEN - 1 VIEW COMPARISON:  07/18/2023, 06/20/2023 FINDINGS: Enteric tube terminates in the left upper quadrant. Gaseous distension of a bowel structure in the mid abdomen, right of midline measuring 12.6 cm in diameter. This does not appear to represent the stomach and may represent a dilated segment of transverse colon. No definite small bowel dilation is evident. No gross free intraperitoneal air on portable supine imaging. IMPRESSION: Gaseous distension of a bowel structure in the mid abdomen, right of midline measuring 12.6 cm in diameter. This does not  appear to represent the stomach and may represent a dilated segment of transverse colon. Consider CT of the abdomen and pelvis to further assess. Electronically Signed   By: Duanne Guess D.O.   On: 08/11/2023 14:28   ECHOCARDIOGRAM COMPLETE Result Date: 08/10/2023    ECHOCARDIOGRAM REPORT   Patient Name:   Elizabeth Bush Date of Exam: 08/10/2023 Medical Rec #:  [id number]     Height:       67.0 in Accession #:    [id number]    Weight:       280.9 lb Date of Birth:  1970-05-30      BSA:          2.336 m Patient Age:    53 years      BP:           112/64 mmHg Patient Gender: F             HR:           81 bpm. Exam Location:  Inpatient Procedure: 2D Echo, Cardiac Doppler and Color Doppler (Both Spectral and Color            Flow Doppler were utilized during procedure). Indications:    Shock R57.9  History:        Patient has no prior history of Echocardiogram examinations.                 Arrythmias:Tachycardia; Risk Factors:Hypertension.  Sonographer:    Lucendia Herrlich RCS Referring Phys: [id number] LAURA R GLEASON IMPRESSIONS  1. Left ventricular ejection fraction, by estimation, is 65%. The left ventricle has normal function. The left ventricle has no regional wall motion abnormalities. Left ventricular diastolic parameters are consistent with Grade I diastolic dysfunction (impaired relaxation).  2.  Right ventricular systolic function is normal. The right ventricular size is normal.  3. The mitral valve is normal in structure. No evidence of mitral valve regurgitation. No evidence of mitral stenosis.  4. The aortic valve was not well visualized. Aortic valve regurgitation is not visualized. No aortic stenosis is present. Comparison(s): No prior Echocardiogram. FINDINGS  Left Ventricle: No Strain or 3D seen on study review. Left ventricular ejection fraction, by estimation, is 65%. The left ventricle has normal function. The left ventricle has no regional wall motion abnormalities. Strain was performed and the  global longitudinal strain is indeterminate. The left ventricular internal cavity size was normal in size. There is no left ventricular hypertrophy. Left ventricular diastolic parameters are consistent with Grade I diastolic dysfunction (impaired relaxation). Right Ventricle: The right ventricular size is normal. No increase in right ventricular wall thickness. Right ventricular systolic function is normal. Left Atrium: Left atrial size was normal in size. Right Atrium: Right atrial size was normal in size. Pericardium: Trivial pericardial effusion is present. The pericardial effusion is anterior to the right ventricle. Mitral Valve: The mitral valve is normal in structure. No evidence of mitral valve regurgitation. No evidence of mitral valve stenosis. Tricuspid Valve: The tricuspid valve is normal in structure. Tricuspid valve regurgitation is not demonstrated. No evidence of tricuspid stenosis. Aortic Valve: The aortic valve was not well visualized. Aortic valve regurgitation is not visualized. No aortic stenosis is present. Aortic valve mean gradient measures 7.0 mmHg. Aortic valve peak gradient measures 13.4 mmHg. Aortic valve area, by VTI measures 2.46 cm. Pulmonic Valve: The pulmonic valve was not well visualized. Pulmonic valve regurgitation is not visualized. No evidence of pulmonic stenosis. Aorta: The aortic root and ascending aorta are structurally normal, with no evidence of dilitation. IAS/Shunts: The atrial septum is grossly normal. Additional Comments: 3D was performed not requiring image post processing on an independent workstation and was indeterminate.  LEFT VENTRICLE PLAX 2D LVIDd:         5.00 cm   Diastology LVIDs:         3.30 cm   LV e' medial:    6.09 cm/s LV PW:         0.90 cm   LV E/e' medial:  12.8 LV IVS:        0.80 cm   LV e' lateral:   7.18 cm/s LVOT diam:     2.10 cm   LV E/e' lateral: 10.8 LV SV:         83 LV SV Index:   36 LVOT Area:     3.46 cm  RIGHT VENTRICLE              IVC RV S prime:     15.70 cm/s  IVC diam: 1.90 cm TAPSE (M-mode): 2.4 cm LEFT ATRIUM             Index        RIGHT ATRIUM           Index LA diam:        3.10 cm 1.33 cm/m   RA Area:     12.30 cm LA Vol (A2C):   40.2 ml 17.21 ml/m  RA Volume:   27.80 ml  11.90 ml/m LA Vol (A4C):   33.5 ml 14.34 ml/m LA Biplane Vol: 38.0 ml 16.27 ml/m  AORTIC VALVE AV Area (Vmax):    2.38 cm AV Area (Vmean):   2.28 cm AV Area (VTI):     2.46 cm  AV Vmax:           183.00 cm/s AV Vmean:          125.000 cm/s AV VTI:            0.337 m AV Peak Grad:      13.4 mmHg AV Mean Grad:      7.0 mmHg LVOT Vmax:         126.00 cm/s LVOT Vmean:        82.175 cm/s LVOT VTI:          0.240 m LVOT/AV VTI ratio: 0.71  AORTA Ao Root diam: 3.10 cm Ao Asc diam:  3.70 cm MITRAL VALVE               TRICUSPID VALVE MV Area (PHT): 3.50 cm    TR Peak grad:   22.3 mmHg MV Decel Time: 217 msec    TR Vmax:        236.00 cm/s MV E velocity: 77.75 cm/s MV A velocity: 97.65 cm/s  SHUNTS MV E/A ratio:  0.80        Systemic VTI:  0.24 m                            Systemic Diam: 2.10 cm Riley Lam MD Electronically signed by Riley Lam MD Signature Date/Time: 08/10/2023/6:02:03 PM    Final    Assessment / Plan: 1) 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 Did not have signs of portal htn on EGD 06/2023 (NL).  INR 2.1, platelets 99.  Ammonia level now normal. With moderate ascites seen on CT scan she had 2 Liter paracentesis performed bedside today.  Fluid studies pending.   MELD 3.0: 27 at 08/12/2023  3:10 AM MELD-Na: 25 at 08/12/2023  3:10 AM Calculated from: Serum Creatinine: 1.87 mg/dL at 9/60/4540  9:81 AM Serum Sodium: 142 mmol/L (Using max of 137 mmol/L) at 08/12/2023  3:10 AM Total Bilirubin: 3 mg/dL at 1/91/4782  9:56 AM Serum Albumin: 2.3 g/dL at 07/01/863  7:84 AM INR(ratio): 2.1 at 08/11/2023  5:33 AM Age at listing (hypothetical): 53 years Sex: Female at 08/12/2023  3:10  AM   Maddrey's DF on admit was 78.9.   2) Sepsis/Shock, Pneumococcal pneumonia possible ARDS   3) Acute kidney injury - ATN from shock and/or hepatorenal syndrome.  Received albumin and midodrine stopped 3/26.  Nephrology is on board.  Decision has been made to not proceed with CRRT.   4) Severe metabolic acidosis, parameters improving.   5) Chronic rectal bleeding from hemorrhoids and chronic anemia, w/ severe anemia +decreased Hgb this admission.  Has received 3 units of packed red blood cells this admission.  Hemoglobin stable at 8.5 grams this morning.  6) Distended abdomen/dilated colon, vomited TFs.  CT scan 3/26 showed moderate gaseous distention of the cecum with wandering cecum but no findings of volvulus or obstruction. Otherwise no dilated bowel.  TFs were on hold but I think they are going to try to resume those, ? At slower rate.   The patient is critically ill - agree w/ care as per CCM, nephrology. Prognosis poor, has been made DNR and plan for one-way extubation, no CRRT.   Is on prednisolone 40 mg daily.   Lactulose for HE, currently on 30 g 4 times daily.   Blood product support, serial coags.  Follow-up fluid studies from paracentesis.     LOS: 5  days   Elizabeth Bush. Elizabeth Bush  08/12/2023, 12:35 PM

## 2023-08-12 NOTE — Progress Notes (Signed)
 [facility] ADULT ICU REPLACEMENT PROTOCOL   The patient does apply for the [facility] Adult ICU Electrolyte Replacment Protocol based on the criteria listed below:   1.Exclusion criteria: TCTS, ECMO, Dialysis, and Myasthenia Gravis patients 2. Is GFR >/= 30 ml/min? Yes.    Patient's GFR today is 32 3. Is SCr </= 2? Yes.   Patient's SCr is 1.87 mg/dL 4. Did SCr increase >/= 0.5 in 24 hours? No. 5.Pt's weight >40kg  Yes.   6. Abnormal electrolyte(s): Potassium, Magnesium  7. Electrolytes replaced per protocol 8.  Call MD STAT for K+ </= 2.5, Phos </= 1, or Mag </= 1 Physician:  Dr. Namon Cirri A Karista Aispuro 08/12/2023 5:09 AM

## 2023-08-12 NOTE — Progress Notes (Addendum)
 eLink Physician-Brief Progress Note Patient Name: Elizabeth Bush DOB: 1970-07-20 MRN: [id number]   Date of Service  08/12/2023  HPI/Events of Note  Patient with hypotension after large volume paracentesis.  eICU Interventions  50 gm of 25 % Albumin ordered. Peripheral Levophed gtt also added to the [facility].        Migdalia Dk 08/12/2023, 10:36 PM
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
## 2023-08-13 ENCOUNTER — Inpatient Hospital Stay (HOSPITAL_COMMUNITY)

## 2023-08-13 DIAGNOSIS — Z66 Do not resuscitate: Secondary | ICD-10-CM | POA: Diagnosis not present

## 2023-08-13 DIAGNOSIS — K7682 Hepatic encephalopathy: Secondary | ICD-10-CM | POA: Diagnosis not present

## 2023-08-13 DIAGNOSIS — D696 Thrombocytopenia, unspecified: Secondary | ICD-10-CM

## 2023-08-13 DIAGNOSIS — Z7189 Other specified counseling: Secondary | ICD-10-CM | POA: Diagnosis not present

## 2023-08-13 DIAGNOSIS — Z515 Encounter for palliative care: Secondary | ICD-10-CM | POA: Diagnosis not present

## 2023-08-13 LAB — GLUCOSE, CAPILLARY
Glucose-Capillary: 115 mg/dL — ABNORMAL HIGH (ref 70–99)
Glucose-Capillary: 117 mg/dL — ABNORMAL HIGH (ref 70–99)
Glucose-Capillary: 118 mg/dL — ABNORMAL HIGH (ref 70–99)
Glucose-Capillary: 166 mg/dL — ABNORMAL HIGH (ref 70–99)
Glucose-Capillary: 81 mg/dL (ref 70–99)
Glucose-Capillary: 82 mg/dL (ref 70–99)
Glucose-Capillary: 84 mg/dL (ref 70–99)

## 2023-08-13 LAB — PROCALCITONIN: Procalcitonin: 1.58 ng/mL

## 2023-08-13 LAB — HEPATIC FUNCTION PANEL
ALT: 28 U/L (ref 0–44)
AST: 51 U/L — ABNORMAL HIGH (ref 15–41)
Albumin: 3.1 g/dL — ABNORMAL LOW (ref 3.5–5.0)
Alkaline Phosphatase: 65 U/L (ref 38–126)
Bilirubin, Direct: 1.2 mg/dL — ABNORMAL HIGH (ref 0.0–0.2)
Indirect Bilirubin: 1.2 mg/dL — ABNORMAL HIGH (ref 0.3–0.9)
Total Bilirubin: 2.4 mg/dL — ABNORMAL HIGH (ref 0.0–1.2)
Total Protein: 5.6 g/dL — ABNORMAL LOW (ref 6.5–8.1)

## 2023-08-13 LAB — RENAL FUNCTION PANEL
Albumin: 3.1 g/dL — ABNORMAL LOW (ref 3.5–5.0)
Anion gap: 13 (ref 5–15)
BUN: 35 mg/dL — ABNORMAL HIGH (ref 6–20)
CO2: 23 mmol/L (ref 22–32)
Calcium: 9.2 mg/dL (ref 8.9–10.3)
Chloride: 106 mmol/L (ref 98–111)
Creatinine, Ser: 1.74 mg/dL — ABNORMAL HIGH (ref 0.44–1.00)
GFR, Estimated: 34 mL/min — ABNORMAL LOW (ref >60)
Glucose, Bld: 144 mg/dL — ABNORMAL HIGH (ref 70–99)
Phosphorus: 4 mg/dL (ref 2.5–4.6)
Potassium: 3.3 mmol/L — ABNORMAL LOW (ref 3.5–5.1)
Sodium: 142 mmol/L (ref 135–145)

## 2023-08-13 LAB — MAGNESIUM: Magnesium: 2 mg/dL (ref 1.7–2.4)

## 2023-08-13 LAB — LACTIC ACID, PLASMA: Lactic Acid, Venous: 2.4 mmol/L (ref 0.5–1.9)

## 2023-08-13 LAB — CBC
HCT: 25.1 % — ABNORMAL LOW (ref 36.0–46.0)
Hemoglobin: 8 g/dL — ABNORMAL LOW (ref 12.0–15.0)
MCH: 29 pg (ref 26.0–34.0)
MCHC: 31.9 g/dL (ref 30.0–36.0)
MCV: 90.9 fL (ref 80.0–100.0)
Platelets: 74 10*3/uL — ABNORMAL LOW (ref 150–400)
RBC: 2.76 MIL/uL — ABNORMAL LOW (ref 3.87–5.11)
RDW: 20.4 % — ABNORMAL HIGH (ref 11.5–15.5)
WBC: 19.5 10*3/uL — ABNORMAL HIGH (ref 4.0–10.5)
nRBC: 0.1 % (ref 0.0–0.2)

## 2023-08-13 LAB — PHOSPHORUS: Phosphorus: 3.9 mg/dL (ref 2.5–4.6)

## 2023-08-13 LAB — PATHOLOGIST SMEAR REVIEW

## 2023-08-13 LAB — MRSA NEXT GEN BY PCR, NASAL: MRSA by PCR Next Gen: NOT DETECTED

## 2023-08-13 LAB — CULTURE, BLOOD (ROUTINE X 2): Culture: NO GROWTH

## 2023-08-13 MED ORDER — LINEZOLID 600 MG/300ML IV SOLN
600.0000 mg | Freq: Two times a day (BID) | INTRAVENOUS | Status: DC
  Administered 2023-08-13 – 2023-08-14 (×2): 600 mg via INTRAVENOUS
  Filled 2023-08-13 (×2): qty 300

## 2023-08-13 MED ORDER — RIFAXIMIN 550 MG PO TABS
550.0000 mg | ORAL_TABLET | Freq: Two times a day (BID) | ORAL | Status: DC
  Administered 2023-08-13 – 2023-08-16 (×6): 550 mg
  Filled 2023-08-13 (×6): qty 1

## 2023-08-13 MED ORDER — FENTANYL CITRATE PF 50 MCG/ML IJ SOSY
25.0000 ug | PREFILLED_SYRINGE | INTRAMUSCULAR | Status: DC | PRN
  Administered 2023-08-16: 50 ug via INTRAVENOUS
  Filled 2023-08-13: qty 1

## 2023-08-13 MED ORDER — ALBUMIN HUMAN 25 % IV SOLN
25.0000 g | Freq: Four times a day (QID) | INTRAVENOUS | Status: AC
  Administered 2023-08-13 – 2023-08-14 (×3): 25 g via INTRAVENOUS
  Administered 2023-08-14: 12.5 g via INTRAVENOUS
  Filled 2023-08-13 (×4): qty 100

## 2023-08-13 MED ORDER — POTASSIUM CHLORIDE 20 MEQ PO PACK
40.0000 meq | PACK | ORAL | Status: AC
  Administered 2023-08-13 (×2): 40 meq
  Filled 2023-08-13 (×2): qty 2

## 2023-08-13 MED ORDER — VITAL 1.5 CAL PO LIQD
1000.0000 mL | ORAL | Status: DC
  Administered 2023-08-13 – 2023-08-14 (×2): 1000 mL
  Filled 2023-08-13 (×3): qty 1000

## 2023-08-13 MED ORDER — PIPERACILLIN-TAZOBACTAM 3.375 G IVPB
3.3750 g | Freq: Three times a day (TID) | INTRAVENOUS | Status: DC
  Administered 2023-08-13 – 2023-08-17 (×11): 3.375 g via INTRAVENOUS
  Filled 2023-08-13 (×11): qty 50

## 2023-08-13 MED ORDER — MIDAZOLAM HCL 2 MG/2ML IJ SOLN
2.0000 mg | Freq: Once | INTRAMUSCULAR | Status: AC
  Administered 2023-08-13: 2 mg via INTRAVENOUS
  Filled 2023-08-13: qty 2

## 2023-08-13 MED ORDER — NOREPINEPHRINE 16 MG/250ML-% IV SOLN
0.0000 ug/min | INTRAVENOUS | Status: DC
  Administered 2023-08-13: 14 ug/min via INTRAVENOUS
  Filled 2023-08-13: qty 250

## 2023-08-13 NOTE — Progress Notes (Addendum)
 Nutrition Follow-up  DOCUMENTATION CODES:   Not applicable  INTERVENTION:  - Per CCM, can initiate trickle tube feeds today: Vital 1.5 @ 30mL/hr   - Monitor magnesium, potassium, and phosphorus BID for at least 3 days, MD to replete as needed, as pt may be at risk for refeeding syndrome.   - FWF per CCM /MD.   - Once able to advance past trickles, recommend: Vital 1.5 at 50 ml/h (1200 ml per day) *Would consider increasing by 10mL Q12H Prosource TF20 60 ml BID Provides 1960 kcal, 121 gm protein, 917 ml free water daily    NUTRITION DIAGNOSIS:   Inadequate oral intake related to inability to eat as evidenced by NPO status. *ongoing  GOAL:   Patient will meet greater than or equal to 90% of their needs *progressing  MONITOR:   Vent status, TF tolerance, Labs, Weight trends  REASON FOR ASSESSMENT:   Consult, Ventilator Enteral/tube feeding initiation and management  ASSESSMENT:   53 y.o. female presented ot the ED with increased confusion and ongoing bleeding since previous admission during February. PMH includes GERD, depression, HTN, EtOH abuse, and anemia. Pt admitted with acute respiratory failure 2/2 PNA, acute encephalopathy, and concern for sepsis.  3/22 - Admitted; Intubated  3/24 - Trickle TF started  3/25 - Began advancing TF 3/26 - TF's stopped due to white/bile secretions from bite block  Patient remains intubated on ventilator support MV: 8.5 L/min Temp (24hrs), Avg:96.8 F (36 C), Min:94.1 F (34.5 C), Max:99.8 F (37.7 C)  TF's turned off 3/26 due to white/bile secretions from bite block. CT abdomen/pelvis from that same day showed moderate gaseous distension of the cecum but no findings of obstruction and no dilated bowel.  Per discussion with CCM NP, can restart trickle tube feeds today.  CCM hopeful to advance TF tomorrow, will provide goal recs above.  Of note, palliative care following patient. She is now DNR/DNI. Plan to continue current  medical interventions through the weekend to allow time for mentation to improve. If little or no improvement, family to consider to comfort care.   Admit weight: 224# Current weight: 266# I&O's: +9.1L + for deep pitting edema to left and right upper and lower extremities   Medications reviewed and include: Lactulose QID Levophed @ 15 mcg/min   Labs reviewed:  K+ 3.3 Creatinine 1.74   Diet Order:   Diet Order             Diet NPO time specified  Diet effective now                   EDUCATION NEEDS:  Not appropriate for education at this time  Skin:  Skin Assessment: Skin Integrity Issues: Skin Integrity Issues:: Stage II Stage II: R Buttocks  Last BM:  3/27 - rectal tube  Height:  Ht Readings from Last 1 Encounters:  08/12/23 5' 7.01" (1.702 m)   Weight:  Wt Readings from Last 1 Encounters:  08/13/23 121 kg   Ideal Body Weight:  61.4 kg  BMI:  Body mass index is 41.77 kg/m.  Estimated Nutritional Needs:  Kcal:  1850-2100 kcals Protein:  100-125 grams Fluid:  >/= 1.9L    Shelle Iron RD, LDN Contact via Secure Chat.

## 2023-08-13 NOTE — Progress Notes (Signed)
 Brief PCCM Progress note   Given rise in fever curve with persistent leukocytosis attending repeated septic workup which revealed elevated lactic and procal. Decision was made to broaden antibiotics to Zosyn and Vanc and to check repeat sputum culture.   Elizabeth Kaczynski D. Harris, NP-C [facility] Personal contact information can be found on Amion  If no contact or response made please call [phone] 08/13/2023, 5:49 PM

## 2023-08-13 NOTE — Progress Notes (Signed)
 Patient transported on the ventilator to CT and back to 1230 with no complications. Vitals stable. RN at bedside.

## 2023-08-13 NOTE — Progress Notes (Signed)
 [facility] ADULT ICU REPLACEMENT PROTOCOL   The patient does apply for the [facility] Adult ICU Electrolyte Replacment Protocol based on the criteria listed below:   1.Exclusion criteria: TCTS, ECMO, Dialysis, and Myasthenia Gravis patients 2. Is GFR >/= 30 ml/min? Yes.    Patient's GFR today is 34 3. Is SCr </= 2? Yes.   Patient's SCr is 1.74 mg/dL 4. Did SCr increase >/= 0.5 in 24 hours? No. 5.Pt's weight >40kg  Yes.   6. Abnormal electrolyte(s): Potassium  7. Electrolytes replaced per protocol 8.  Call MD STAT for K+ </= 2.5, Phos </= 1, or Mag </= 1 Physician:  Dr. Namon Cirri A Yannet Rincon 08/13/2023 5:28 AM

## 2023-08-13 NOTE — IPAL (Signed)
  Interdisciplinary Goals of Care Family Meeting   Date carried out: 08/13/2023  Location of the meeting: Phone conference  Member's involved: Nurse Practitioner and Family Member or next of kin  Durable Power of Attorney or acting medical decision maker: Shared among children     Discussion: Plan of care discussion held with all three children today for ongoing goals of care. Currently mentation continues to remain a barrier to extubation. Decision was made to continue current interventions through the weekend to allow time for mentation to improve. If little to no improvement is seen family would like to consider transition to a more comfort care approach to prioritize quality of life   Code status:   Code Status: Limited: Do not attempt resuscitation (DNR) -DNR-LIMITED -Do Not Intubate/DNI    Disposition: Continue current acute care  Time spent for the meeting: 42 mins   Tunisha Ruland D. Harris, NP-C [facility] Personal contact information can be found on Amion  If no contact or response made please call [phone] 08/13/2023, 2:37 PM

## 2023-08-13 NOTE — Progress Notes (Addendum)
 MB called and spoke to nurse Eileen Stanford), Pt is pending for a STAT CT at this point and not available for EEG. Nurse was informed that next Staff availability as schedule permits  would be available sometime Monday afternoon due to Staff Cutoff for Friday afternoon.

## 2023-08-13 NOTE — Plan of Care (Signed)
  Problem: Fluid Volume: Goal: Ability to maintain a balanced intake and output will improve Outcome: Progressing   Problem: Metabolic: Goal: Ability to maintain appropriate glucose levels will improve Outcome: Progressing   Problem: Tissue Perfusion: Goal: Adequacy of tissue perfusion will improve Outcome: Progressing   Problem: Clinical Measurements: Goal: Ability to maintain clinical measurements within normal limits will improve Outcome: Progressing

## 2023-08-13 NOTE — Progress Notes (Signed)
 Daily Progress Note   Patient Name: Elizabeth Bush       Date: 08/13/2023 DOB: Jun 23, 1970  Age: 52 y.o. MRN#: [id number] Attending Physician: Oretha Milch, MD Primary Care Physician: Marcine Matar, MD Admit Date: 08/07/2023 Length of Stay: 6 days  Reason for Consultation/Follow-up: Establishing goals of care  Subjective:   CC: Patient remains intubated and only responding to pain.  Following up regarding complex medical decision making.  Subjective:  Reviewed EMR prior to presenting to bedside.  Reviewed CMP noting creatinine continues to downtrend to 1.74, GFR 34, bilirubin 2.4.  Reviewed CBC noting leukocytosis of 19.5 (on prednisone) and platelets of 74. Discussed care with RN and PCCM provider extensively for updates.  Patient will not be extubated today.  Potentially tomorrow.  PCCM provider, Janeann Forehand, NP, has already called to discuss care with patient's 3 children today.  Continuing current plan of optimizing patient with plan for extubation, no reintubation.  Presented to bedside to see patient.  No family present at bedside.  Patient responsive to pain.  Appears chronically ill.  Noted palliative medicine team to continue following with patient's medical journey.  Objective:   Vital Signs:  BP 104/75   Pulse (!) 124   Temp (P) 99.8 F (37.7 C) (Oral)   Resp 18   Ht 5' 7.01" (1.702 m)   Wt 121 kg   LMP  (LMP Unknown)   SpO2 100%   BMI 41.77 kg/m   Physical Exam: General: Intubated, unresponsive, chronically ill-appearing Cardiovascular: Bradycardia noted, anasarca present in extremities Respiratory: Intubated on ventilator support Neuro: Sedated  Imaging: I personally reviewed recent imaging.   Assessment & Plan:   Assessment: Patient is a 53 year old female with a past medical history of EtOH cirrhosis/hepatitis, depression, stage II decubitus ulcer present on arrival, and hypertension who was admitted on 08/07/2023 due to ongoing bleeding and  increasing confusion. 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 Upon admission, patient noted to have acute hypoxic respiratory failure secondary to pneumonia, acute encephalopathy in the setting of EtOH cirrhosis, acute blood loss anemia, AKI, and septic shock. Patient has received aggressive medical management in the ICU to support this while patient remains on ventilator support. GI and nephrology consulted for recommendations. Palliative medicine team consulted to assist with complex medical decision making.   Recommendations/Plan: # Complex medical decision making/goals of care:   -Patient unable to participate in complex medical decision-making due to current medical status.                -Discussed care with patient's 3 children on 08/11/2023.  Please see prior note for full discussion.  No family present at bedside today when visited today.  At this time continuing appropriate medical management with hope to optimize as able.  Will not proceed with CRRT as would not change patient's overall outcome and could cause harm; patient would also not be a candidate for long-term dialysis.  Plan for extubation as per PCCM guidance to give patient the "best opportunity".  Once patient is extubated, she will not be reintubated.  If patient able to protect her airway, will continue medical management while monitoring closely to determine next steps of care.  Should patient deteriorate after extubation, would recommend transition to comfort focused care.  3 children have agreed with this plan during multiple discussions to different providers as patient's quality of life  moving forward would be important.  Palliative medicine team continuing to follow along to engage in conversations as able.                -Have already inquired if patient had completed ACP  documentation and though this has been discussed, has not been completed. [name] noted patient is not married.  Discussed based on [place] laws if patient does not have healthcare power of attorney and is not married, medical decision making would fall to majority of reasonably available parents and children over the age of 72.           Code Status: Limited: Do not attempt resuscitation (DNR) -DNR-LIMITED -Do Not Intubate/DNI   # Psycho-social/Spiritual Support:  - Support System: 2 daughters, 1 son   # Discharge Planning:  To Be Determined   Discussed with: RN, PCCM provider  Thank you for allowing the palliative care team to participate in the care Devany M Paolella.  Alvester Morin, DO Palliative Care Provider PMT # [phone]  If patient remains symptomatic despite maximum doses, please call PMT at [phone] between 0700 and 1900. Outside of these hours, please call attending, as PMT does not have night coverage.  Personally spent 30 minutes in patient care including extensive chart review (labs, imaging, progress/consult notes, vital signs), medically appropraite exam, discussed with treatment team, education to patient, family, and staff, documenting clinical information, medication review and management, coordination of care, and available advanced directive documents.
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
## 2023-08-14 ENCOUNTER — Inpatient Hospital Stay (HOSPITAL_COMMUNITY)

## 2023-08-14 DIAGNOSIS — Z7189 Other specified counseling: Secondary | ICD-10-CM | POA: Diagnosis not present

## 2023-08-14 DIAGNOSIS — D72829 Elevated white blood cell count, unspecified: Secondary | ICD-10-CM

## 2023-08-14 DIAGNOSIS — E870 Hyperosmolality and hypernatremia: Secondary | ICD-10-CM

## 2023-08-14 DIAGNOSIS — Z66 Do not resuscitate: Secondary | ICD-10-CM | POA: Diagnosis not present

## 2023-08-14 DIAGNOSIS — K767 Hepatorenal syndrome: Secondary | ICD-10-CM | POA: Diagnosis not present

## 2023-08-14 DIAGNOSIS — Z515 Encounter for palliative care: Secondary | ICD-10-CM | POA: Diagnosis not present

## 2023-08-14 LAB — GLUCOSE, CAPILLARY
Glucose-Capillary: 127 mg/dL — ABNORMAL HIGH (ref 70–99)
Glucose-Capillary: 120 mg/dL — ABNORMAL HIGH (ref 70–99)
Glucose-Capillary: 99 mg/dL (ref 70–99)
Glucose-Capillary: 119 mg/dL — ABNORMAL HIGH (ref 70–99)
Glucose-Capillary: 156 mg/dL — ABNORMAL HIGH (ref 70–99)
Glucose-Capillary: 126 mg/dL — ABNORMAL HIGH (ref 70–99)

## 2023-08-14 LAB — COMPREHENSIVE METABOLIC PANEL WITH GFR
ALT: 31 U/L (ref 0–44)
AST: 57 U/L — ABNORMAL HIGH (ref 15–41)
Albumin: 3.7 g/dL (ref 3.5–5.0)
Alkaline Phosphatase: 60 U/L (ref 38–126)
Anion gap: 14 (ref 5–15)
BUN: 36 mg/dL — ABNORMAL HIGH (ref 6–20)
CO2: 24 mmol/L (ref 22–32)
Calcium: 9.2 mg/dL (ref 8.9–10.3)
Chloride: 108 mmol/L (ref 98–111)
Creatinine, Ser: 1.85 mg/dL — ABNORMAL HIGH (ref 0.44–1.00)
GFR, Estimated: 32 mL/min — ABNORMAL LOW (ref >60)
Glucose, Bld: 157 mg/dL — ABNORMAL HIGH (ref 70–99)
Potassium: 3.4 mmol/L — ABNORMAL LOW (ref 3.5–5.1)
Sodium: 146 mmol/L — ABNORMAL HIGH (ref 135–145)
Total Bilirubin: 2.8 mg/dL — ABNORMAL HIGH (ref 0.0–1.2)
Total Protein: 6.1 g/dL — ABNORMAL LOW (ref 6.5–8.1)

## 2023-08-14 LAB — LACTIC ACID, PLASMA: Lactic Acid, Venous: 2.2 mmol/L (ref 0.5–1.9)

## 2023-08-14 LAB — CBC
HCT: 22.9 % — ABNORMAL LOW (ref 36.0–46.0)
Hemoglobin: 7.2 g/dL — ABNORMAL LOW (ref 12.0–15.0)
MCH: 29.4 pg (ref 26.0–34.0)
MCHC: 31.4 g/dL (ref 30.0–36.0)
MCV: 93.5 fL (ref 80.0–100.0)
Platelets: 61 10*3/uL — ABNORMAL LOW (ref 150–400)
RBC: 2.45 MIL/uL — ABNORMAL LOW (ref 3.87–5.11)
RDW: 20.9 % — ABNORMAL HIGH (ref 11.5–15.5)
WBC: 27.3 10*3/uL — ABNORMAL HIGH (ref 4.0–10.5)
nRBC: 0.1 % (ref 0.0–0.2)

## 2023-08-14 LAB — MAGNESIUM
Magnesium: 1.8 mg/dL (ref 1.7–2.4)
Magnesium: 1.9 mg/dL (ref 1.7–2.4)

## 2023-08-14 LAB — RENAL FUNCTION PANEL
Albumin: 3.7 g/dL (ref 3.5–5.0)
Anion gap: 16 — ABNORMAL HIGH (ref 5–15)
BUN: 37 mg/dL — ABNORMAL HIGH (ref 6–20)
CO2: 22 mmol/L (ref 22–32)
Calcium: 9.2 mg/dL (ref 8.9–10.3)
Chloride: 109 mmol/L (ref 98–111)
Creatinine, Ser: 1.83 mg/dL — ABNORMAL HIGH (ref 0.44–1.00)
GFR, Estimated: 32 mL/min — ABNORMAL LOW (ref >60)
Glucose, Bld: 154 mg/dL — ABNORMAL HIGH (ref 70–99)
Phosphorus: 4.2 mg/dL (ref 2.5–4.6)
Potassium: 3.5 mmol/L (ref 3.5–5.1)
Sodium: 147 mmol/L — ABNORMAL HIGH (ref 135–145)

## 2023-08-14 LAB — PROCALCITONIN: Procalcitonin: 2.81 ng/mL

## 2023-08-14 LAB — PROTIME-INR
INR: 2.7 — ABNORMAL HIGH (ref 0.8–1.2)
Prothrombin Time: 28.9 s — ABNORMAL HIGH (ref 11.4–15.2)

## 2023-08-14 LAB — PHOSPHORUS
Phosphorus: 4.2 mg/dL (ref 2.5–4.6)
Phosphorus: 3.9 mg/dL (ref 2.5–4.6)

## 2023-08-14 LAB — AMMONIA: Ammonia: 19 umol/L (ref 9–35)

## 2023-08-14 MED ORDER — FREE WATER
200.0000 mL | Status: DC
Start: 2023-08-14 — End: 2023-08-15
  Administered 2023-08-14 – 2023-08-15 (×6): 200 mL

## 2023-08-14 MED ORDER — POTASSIUM CHLORIDE 20 MEQ PO PACK
40.0000 meq | PACK | Freq: Once | ORAL | Status: AC
  Administered 2023-08-14: 40 meq
  Filled 2023-08-14 (×2): qty 2

## 2023-08-14 MED ORDER — MAGNESIUM SULFATE 2 GM/50ML IV SOLN
2.0000 g | Freq: Once | INTRAVENOUS | Status: AC
  Administered 2023-08-14: 2 g via INTRAVENOUS
  Filled 2023-08-14 (×2): qty 50

## 2023-08-14 MED ORDER — STERILE WATER FOR INJECTION IJ SOLN
INTRAMUSCULAR | Status: AC
Start: 2023-08-14 — End: 2023-08-14
  Administered 2023-08-14: 10 mL
  Filled 2023-08-14: qty 10

## 2023-08-14 MED ORDER — FUROSEMIDE 10 MG/ML IJ SOLN
80.0000 mg | Freq: Four times a day (QID) | INTRAMUSCULAR | Status: AC
  Administered 2023-08-14 (×2): 80 mg via INTRAVENOUS
  Filled 2023-08-14 (×2): qty 8

## 2023-08-14 NOTE — Progress Notes (Signed)
 PATIENT REMAINS ON CPAP PS 10/5 DOING WELL AT THIS TIME.

## 2023-08-14 NOTE — Progress Notes (Signed)
 Daily Progress Note   Patient Name: Elizabeth Bush       Date: 08/14/2023 DOB: May 11, 1971  Age: 53 y.o. MRN#: [id number] Attending Physician: Karren Burly, MD Primary Care Physician: Marcine Matar, MD Admit Date: 08/07/2023 Length of Stay: 7 days  Reason for Consultation/Follow-up: Establishing goals of care  Subjective:   CC: Patient remains intubated, though much more awake and following commands at this time.  Following up regarding complex medical decision making.  Subjective:  Reviewed EMR prior to presenting to bedside.  Reviewed PCCM provider recommendations for today.  Hoping for extubation tomorrow afternoon based on patient's improved mentation.  When presenting to bedside, is awake with eyes open while intubated.  Patient following commands regarding extremities. Discussed care with bedside RN for medical updates.  Objective:   Vital Signs:  BP (!) 137/58   Pulse (!) 101   Temp 97.8 F (36.6 C) (Axillary)   Resp 14   Ht 5' 7.01" (1.702 m)   Wt 116.1 kg   LMP  (LMP Unknown)   SpO2 100%   BMI 40.08 kg/m   Physical Exam: General: Intubated, awake, following commands, chronically ill-appearing Cardiovascular: Tachycardia noted Respiratory: Intubated on ventilator support Neuro: Awake on ventilator, following commands  Imaging: I personally reviewed recent imaging.   Assessment & Plan:   Assessment: Patient is a 53 year old female with a past medical history of EtOH cirrhosis/hepatitis, depression, stage II decubitus ulcer present on arrival, and hypertension who was admitted on 08/07/2023 due to ongoing bleeding and increasing confusion. 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 Upon admission, patient noted to have acute hypoxic respiratory failure secondary to pneumonia, acute encephalopathy in the setting  of EtOH cirrhosis, acute blood loss anemia, AKI, and septic shock. Patient has received aggressive medical management in the ICU to support this while patient remains on ventilator support. GI and nephrology consulted for recommendations. Palliative medicine team consulted to assist with complex medical decision making.   Recommendations/Plan: # Complex medical decision making/goals of care:   -Patient unable to participate in complex medical decision-making due to current medical status.                -Discussed care with patient's 3 children on 08/11/2023.  Please see prior note for full discussion.  No family present at bedside today when visited today.  At this time continuing appropriate medical management with hope to optimize as able.  Will not proceed with CRRT as would not change patient's overall outcome and could cause harm; patient would also not be a candidate for long-term dialysis.  Plan for extubation as per PCCM guidance to give patient the "best opportunity".  Once patient is extubated, she will not be reintubated.  If patient able to protect her airway, will continue medical management while monitoring closely to determine next steps of care.  Should patient deteriorate after extubation, would recommend transition to comfort focused care.  3 children have agreed with this plan during multiple discussions to different providers as patient's quality of life moving forward would be important.  Palliative medicine team continuing to follow along to engage in conversations as able.   -When patient is going to be extubated as per PCCM guidance, please inform children so can be present at bedside prior to extubation so if patient deteriorates, they can be present at bedside if needing comfort care.                -  Have already inquired if patient had completed ACP documentation and though this has been discussed, has not been completed. [name] noted patient is not married.  Discussed based on [place] laws if patient does not have healthcare power of attorney and is not married, medical decision making would fall to majority of reasonably available parents and children over the age of 58.           Code Status: Limited: Do not attempt resuscitation (DNR) -DNR-LIMITED -Do Not Intubate/DNI   # Psycho-social/Spiritual Support:  - Support System: 2 daughters, 1 son   # Discharge Planning:  To Be Determined   Discussed with: RN, PCCM provider  Thank you for allowing the palliative care team to participate in the care Danay M Maselli.  Alvester Morin, DO Palliative Care Provider PMT # [phone]  If patient remains symptomatic despite maximum doses, please call PMT at [phone] between 0700 and 1900. Outside of these hours, please call attending, as PMT does not have night coverage.
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
## 2023-08-14 NOTE — Progress Notes (Signed)
 [facility] ADULT ICU REPLACEMENT PROTOCOL   The patient does apply for the [facility] Adult ICU Electrolyte Replacment Protocol based on the criteria listed below:   1.Exclusion criteria: TCTS, ECMO, Dialysis, and Myasthenia Gravis patients 2. Is GFR >/= 30 ml/min? Yes.    Patient's GFR today is 32 3. Is SCr </= 2? Yes.   Patient's SCr is 1.85 mg/dL 4. Did SCr increase >/= 0.5 in 24 hours? No. 5.Pt's weight >40kg  Yes.   6. Abnormal electrolyte(s): Potassium, Magnesium  7. Electrolytes replaced per protocol 8.  Call MD STAT for K+ </= 2.5, Phos </= 1, or Mag </= 1 Physician:  Dr. Namon Cirri A Maude Hettich 08/14/2023 6:09 AM

## 2023-08-15 DIAGNOSIS — Z515 Encounter for palliative care: Secondary | ICD-10-CM | POA: Diagnosis not present

## 2023-08-15 DIAGNOSIS — Z7189 Other specified counseling: Secondary | ICD-10-CM | POA: Diagnosis not present

## 2023-08-15 DIAGNOSIS — R4589 Other symptoms and signs involving emotional state: Secondary | ICD-10-CM | POA: Diagnosis not present

## 2023-08-15 DIAGNOSIS — K767 Hepatorenal syndrome: Secondary | ICD-10-CM | POA: Diagnosis not present

## 2023-08-15 LAB — RENAL FUNCTION PANEL
Albumin: 3.3 g/dL — ABNORMAL LOW (ref 3.5–5.0)
Anion gap: 12 (ref 5–15)
BUN: 38 mg/dL — ABNORMAL HIGH (ref 6–20)
CO2: 26 mmol/L (ref 22–32)
Calcium: 9.6 mg/dL (ref 8.9–10.3)
Chloride: 112 mmol/L — ABNORMAL HIGH (ref 98–111)
Creatinine, Ser: 1.54 mg/dL — ABNORMAL HIGH (ref 0.44–1.00)
GFR, Estimated: 40 mL/min — ABNORMAL LOW (ref >60)
Glucose, Bld: 148 mg/dL — ABNORMAL HIGH (ref 70–99)
Phosphorus: 3.7 mg/dL (ref 2.5–4.6)
Potassium: 2.9 mmol/L — ABNORMAL LOW (ref 3.5–5.1)
Sodium: 150 mmol/L — ABNORMAL HIGH (ref 135–145)

## 2023-08-15 LAB — GLUCOSE, CAPILLARY
Glucose-Capillary: 110 mg/dL — ABNORMAL HIGH (ref 70–99)
Glucose-Capillary: 122 mg/dL — ABNORMAL HIGH (ref 70–99)
Glucose-Capillary: 126 mg/dL — ABNORMAL HIGH (ref 70–99)
Glucose-Capillary: 141 mg/dL — ABNORMAL HIGH (ref 70–99)
Glucose-Capillary: 81 mg/dL (ref 70–99)
Glucose-Capillary: 94 mg/dL (ref 70–99)

## 2023-08-15 LAB — BODY FLUID CULTURE W GRAM STAIN
Culture: NO GROWTH
Gram Stain: NONE SEEN

## 2023-08-15 LAB — PHOSPHORUS: Phosphorus: 3.7 mg/dL (ref 2.5–4.6)

## 2023-08-15 LAB — MAGNESIUM: Magnesium: 1.9 mg/dL (ref 1.7–2.4)

## 2023-08-15 MED ORDER — POTASSIUM CHLORIDE 20 MEQ PO PACK: 40.0000 meq | PACK | Freq: Once | ORAL | Status: DC

## 2023-08-15 MED ORDER — POTASSIUM CHLORIDE 20 MEQ PO PACK
40.0000 meq | PACK | ORAL | Status: AC
  Administered 2023-08-15: 40 meq
  Filled 2023-08-15: qty 2

## 2023-08-15 MED ORDER — MAGNESIUM SULFATE 2 GM/50ML IV SOLN
2.0000 g | Freq: Once | INTRAVENOUS | Status: AC
  Administered 2023-08-15: 2 g via INTRAVENOUS
  Filled 2023-08-15: qty 50

## 2023-08-15 MED ORDER — CLONAZEPAM 0.5 MG PO TABS: 0.5000 mg | ORAL_TABLET | Freq: Three times a day (TID) | ORAL | Status: DC

## 2023-08-15 MED ORDER — FREE WATER
200.0000 mL | Status: DC
  Administered 2023-08-15 (×2): 200 mL

## 2023-08-15 MED ORDER — OXYCODONE HCL 5 MG PO TABS: 10.0000 mg | ORAL_TABLET | ORAL | Status: DC

## 2023-08-15 MED ORDER — POTASSIUM CHLORIDE 10 MEQ/50ML IV SOLN
10.0000 meq | INTRAVENOUS | Status: AC
  Administered 2023-08-15 (×4): 10 meq via INTRAVENOUS
  Filled 2023-08-15 (×4): qty 50

## 2023-08-15 MED ORDER — ORAL CARE MOUTH RINSE: 15.0000 mL | OROMUCOSAL | Status: DC | PRN

## 2023-08-15 MED ORDER — PROPOFOL 1000 MG/100ML IV EMUL: 5.0000 ug/kg/min | INTRAVENOUS | Status: DC

## 2023-08-15 MED ORDER — ORAL CARE MOUTH RINSE
15.0000 mL | OROMUCOSAL | Status: DC
  Administered 2023-08-15 – 2023-08-17 (×6): 15 mL via OROMUCOSAL

## 2023-08-15 NOTE — Plan of Care (Signed)
 Patient extubated today, remains on room air oxygen level 100%, respirations 20-26, patient able to answer yes no questions, follows simple commands and opens her eyes.  Problem: Education: Goal: Ability to describe self-care measures that may prevent or decrease complications (Diabetes Survival Skills Education) will improve Outcome: Progressing Goal: Individualized Educational Video(s) Outcome: Progressing   Problem: Coping: Goal: Ability to adjust to condition or change in health will improve Outcome: Progressing   Problem: Fluid Volume: Goal: Ability to maintain a balanced intake and output will improve Outcome: Progressing   Problem: Health Behavior/Discharge Planning: Goal: Ability to identify and utilize available resources and services will improve Outcome: Progressing Goal: Ability to manage health-related needs will improve Outcome: Progressing   Problem: Metabolic: Goal: Ability to maintain appropriate glucose levels will improve Outcome: Progressing   Problem: Nutritional: Goal: Maintenance of adequate nutrition will improve Outcome: Progressing Goal: Progress toward achieving an optimal weight will improve Outcome: Progressing   Problem: Skin Integrity: Goal: Risk for impaired skin integrity will decrease Outcome: Progressing   Problem: Tissue Perfusion: Goal: Adequacy of tissue perfusion will improve Outcome: Progressing   Problem: Education: Goal: Knowledge of General Education information will improve Description: Including pain rating scale, medication(s)/side effects and non-pharmacologic comfort measures Outcome: Progressing   Problem: Health Behavior/Discharge Planning: Goal: Ability to manage health-related needs will improve Outcome: Progressing   Problem: Clinical Measurements: Goal: Ability to maintain clinical measurements within normal limits will improve Outcome: Progressing Goal: Will remain free from infection Outcome:  Progressing Goal: Diagnostic test results will improve Outcome: Progressing Goal: Respiratory complications will improve Outcome: Progressing Goal: Cardiovascular complication will be avoided Outcome: Progressing   Problem: Activity: Goal: Risk for activity intolerance will decrease Outcome: Progressing   Problem: Nutrition: Goal: Adequate nutrition will be maintained Outcome: Progressing   Problem: Coping: Goal: Level of anxiety will decrease Outcome: Progressing   Problem: Elimination: Goal: Will not experience complications related to bowel motility Outcome: Progressing Goal: Will not experience complications related to urinary retention Outcome: Progressing   Problem: Pain Managment: Goal: General experience of comfort will improve and/or be controlled Outcome: Progressing   Problem: Safety: Goal: Ability to remain free from injury will improve Outcome: Progressing   Problem: Skin Integrity: Goal: Risk for impaired skin integrity will decrease Outcome: Progressing   Problem: Safety: Goal: Non-violent Restraint(s) Outcome: Progressing
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
## 2023-08-15 NOTE — Progress Notes (Signed)
 Daily Progress Note   Patient Name: Elizabeth Bush       Date: 08/15/2023 DOB: [birth year]  Age: 53 y.o. MRN#: [id number] Attending Physician: Karren Burly, MD Primary Care Physician: Marcine Matar, MD Admit Date: 08/07/2023 Length of Stay: 8 days  Reason for Consultation/Follow-up: Establishing goals of care  Subjective:   CC: Patient remains intubated, though awake and following commands at this time.  Following up regarding complex medical decision making.  Subjective:  Reviewed EMR prior to presenting to bedside.  Review of CMP noted creatinine 1.54 and GFR 40.  Discussed care with PCCM provider for medical updates.  Planning for extubation around this morning once family at bedside.  Presented to bedside to meet with PCCM provider and RN once family present.  I again reviewed how patient is medically optimized at this point and this would be the appropriate window to attempt extubation to allow patient time to interact with family.  Again confirmed with family at bedside including 2 of patient's children that plan would be to extubate, continue to support medically if doing well, and allow time for outcomes.  If patient deteriorates, would not reintubate, and would need to transition to focus on comfort.  Family still agreeing with this plan.  Noted palliative medicine team continue to follow on the patient's medical journey.  Objective:   Vital Signs:  BP 128/72   Pulse (!) 104   Temp 97.9 F (36.6 C)   Resp 15   Ht 5' 7.01" (1.702 m)   Wt 116.6 kg   LMP  (LMP Unknown)   SpO2 100%   BMI 40.25 kg/m   Physical Exam: General: Intubated, awake, following commands, chronically ill-appearing Cardiovascular: Tachycardia noted Respiratory: Intubated on ventilator support Neuro: Awake on ventilator, following commands  Imaging: I personally reviewed recent imaging.   Assessment & Plan:   Assessment: Patient is a 53 year old female with a past medical history of  EtOH cirrhosis/hepatitis, depression, stage II decubitus ulcer present on arrival, and hypertension who was admitted on 08/07/2023 due to ongoing bleeding and increasing confusion. 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 Upon admission, patient noted to have acute hypoxic respiratory failure secondary to pneumonia, acute encephalopathy in the setting of EtOH cirrhosis, acute blood loss anemia, AKI, and septic shock. Patient has received aggressive medical management in the ICU to support this while patient remains on ventilator support. GI and nephrology consulted for recommendations. Palliative medicine team consulted to assist with complex medical decision making.   Recommendations/Plan: # Complex medical decision making/goals of care:   -Patient unable to participate in complex medical decision-making due to current medical status.                -Discussed care with patient's children at bedside again today and confirmed discussion decisions that were already made on 08/11/2023. Plan for extubation today under PCCM's guidance to give patient the "best opportunity".  Once patient is extubated, she will not be reintubated.  If patient able to protect her airway, will continue medical management while monitoring closely to determine next steps of care.  Should patient deteriorate after extubation, would recommend transition to comfort focused care.  3 children have agreed with this plan during multiple discussions to different providers as patient's quality of life moving forward would be important.  Palliative medicine team continuing to follow along to engage in conversations  as able.                -Have already inquired if patient had completed ACP documentation and though this has been discussed, has not been completed. Elizabeth Bush noted patient is not married.   Discussed based on [place] laws if patient does not have healthcare power of attorney and is not married, medical decision making would fall to majority of reasonably available parents and children over the age of 51.           Code Status: Limited: Do not attempt resuscitation (DNR) -DNR-LIMITED -Do Not Intubate/DNI   # Psycho-social/Spiritual Support:  - Support System: 2 daughters, 1 son   # Discharge Planning:  To Be Determined   Discussed with: RN, PCCM provider  Thank you for allowing the palliative care team to participate in the care Elizabeth Bush.  Elizabeth Morin, DO Palliative Care Provider PMT # [phone]  If patient remains symptomatic despite maximum doses, please call PMT at [phone] between 0700 and 1900. Outside of these hours, please call attending, as PMT does not have night coverage.  Personally spent 35 minutes in patient care including extensive chart review (labs, imaging, progress/consult notes, vital signs), medically appropraite exam, discussed with treatment team, education to patient, family, and staff, documenting clinical information, medication review and management, coordination of care, and available advanced directive documents.

## 2023-08-15 NOTE — Plan of Care (Signed)
 Problem: Education: Goal: Ability to describe self-care measures that may prevent or decrease complications (Diabetes Survival Skills Education) will improve 08/15/2023 0137 by Justus Memory, RN Outcome: Progressing 08/15/2023 0137 by Justus Memory, RN Outcome: Progressing Goal: Individualized Educational Video(s) 08/15/2023 0137 by Justus Memory, RN Outcome: Progressing 08/15/2023 0137 by Justus Memory, RN Outcome: Progressing   Problem: Coping: Goal: Ability to adjust to condition or change in health will improve 08/15/2023 0137 by Justus Memory, RN Outcome: Progressing 08/15/2023 0137 by Justus Memory, RN Outcome: Progressing   Problem: Fluid Volume: Goal: Ability to maintain a balanced intake and output will improve 08/15/2023 0137 by Justus Memory, RN Outcome: Progressing 08/15/2023 0137 by Justus Memory, RN Outcome: Progressing   Problem: Health Behavior/Discharge Planning: Goal: Ability to identify and utilize available resources and services will improve 08/15/2023 0137 by Justus Memory, RN Outcome: Progressing 08/15/2023 0137 by Justus Memory, RN Outcome: Progressing Goal: Ability to manage health-related needs will improve 08/15/2023 0137 by Justus Memory, RN Outcome: Progressing 08/15/2023 0137 by Justus Memory, RN Outcome: Progressing   Problem: Metabolic: Goal: Ability to maintain appropriate glucose levels will improve 08/15/2023 0137 by Justus Memory, RN Outcome: Progressing 08/15/2023 0137 by Justus Memory, RN Outcome: Progressing   Problem: Nutritional: Goal: Maintenance of adequate nutrition will improve 08/15/2023 0137 by Justus Memory, RN Outcome: Progressing 08/15/2023 0137 by Justus Memory, RN Outcome: Progressing Goal: Progress toward achieving an optimal weight will improve 08/15/2023 0137 by Justus Memory, RN Outcome: Progressing 08/15/2023 0137 by Justus Memory, RN Outcome: Progressing   Problem:  Skin Integrity: Goal: Risk for impaired skin integrity will decrease 08/15/2023 0137 by Justus Memory, RN Outcome: Progressing 08/15/2023 0137 by Justus Memory, RN Outcome: Progressing   Problem: Tissue Perfusion: Goal: Adequacy of tissue perfusion will improve 08/15/2023 0137 by Justus Memory, RN Outcome: Progressing 08/15/2023 0137 by Justus Memory, RN Outcome: Progressing   Problem: Education: Goal: Knowledge of General Education information will improve Description: Including pain rating scale, medication(s)/side effects and non-pharmacologic comfort measures 08/15/2023 0137 by Justus Memory, RN Outcome: Progressing 08/15/2023 0137 by Justus Memory, RN Outcome: Progressing   Problem: Health Behavior/Discharge Planning: Goal: Ability to manage health-related needs will improve 08/15/2023 0137 by Justus Memory, RN Outcome: Progressing 08/15/2023 0137 by Justus Memory, RN Outcome: Progressing   Problem: Clinical Measurements: Goal: Ability to maintain clinical measurements within normal limits will improve 08/15/2023 0137 by Justus Memory, RN Outcome: Progressing 08/15/2023 0137 by Justus Memory, RN Outcome: Progressing Goal: Will remain free from infection 08/15/2023 0137 by Justus Memory, RN Outcome: Progressing 08/15/2023 0137 by Justus Memory, RN Outcome: Progressing Goal: Diagnostic test results will improve 08/15/2023 0137 by Justus Memory, RN Outcome: Progressing 08/15/2023 0137 by Justus Memory, RN Outcome: Progressing Goal: Respiratory complications will improve 08/15/2023 0137 by Justus Memory, RN Outcome: Progressing 08/15/2023 0137 by Justus Memory, RN Outcome: Progressing Goal: Cardiovascular complication will be avoided 08/15/2023 1610 by Justus Memory, RN Outcome: Progressing 08/15/2023 0137 by Justus Memory, RN Outcome: Progressing   Problem: Activity: Goal: Risk for activity intolerance will  decrease 08/15/2023 0137 by Justus Memory, RN Outcome: Progressing 08/15/2023 0137 by Justus Memory, RN Outcome: Progressing   Problem: Nutrition: Goal: Adequate nutrition will be maintained 08/15/2023 0137 by Justus Memory, RN Outcome: Progressing 08/15/2023 0137 by Justus Memory, RN Outcome: Progressing  Problem: Coping: Goal: Level of anxiety will decrease 08/15/2023 0137 by Justus Memory, RN Outcome: Progressing 08/15/2023 0137 by Justus Memory, RN Outcome: Progressing   Problem: Elimination: Goal: Will not experience complications related to bowel motility 08/15/2023 0137 by Justus Memory, RN Outcome: Progressing 08/15/2023 0137 by Justus Memory, RN Outcome: Progressing Goal: Will not experience complications related to urinary retention 08/15/2023 0137 by Justus Memory, RN Outcome: Progressing 08/15/2023 0137 by Justus Memory, RN Outcome: Progressing   Problem: Pain Managment: Goal: General experience of comfort will improve and/or be controlled 08/15/2023 0137 by Justus Memory, RN Outcome: Progressing 08/15/2023 0137 by Justus Memory, RN Outcome: Progressing   Problem: Safety: Goal: Ability to remain free from injury will improve 08/15/2023 0137 by Justus Memory, RN Outcome: Progressing 08/15/2023 0137 by Justus Memory, RN Outcome: Progressing   Problem: Skin Integrity: Goal: Risk for impaired skin integrity will decrease 08/15/2023 0137 by Justus Memory, RN Outcome: Progressing 08/15/2023 0137 by Justus Memory, RN Outcome: Progressing   Problem: Safety: Goal: Non-violent Restraint(s) 08/15/2023 0137 by Justus Memory, RN Outcome: Progressing 08/15/2023 0137 by Justus Memory, RN Outcome: Progressing

## 2023-08-15 NOTE — Progress Notes (Signed)
 [facility] ADULT ICU REPLACEMENT PROTOCOL   The patient does apply for the [facility] Adult ICU Electrolyte Replacment Protocol based on the criteria listed below:   1.Exclusion criteria: TCTS, ECMO, Dialysis, and Myasthenia Gravis patients 2. Is GFR >/= 30 ml/min? Yes.    Patient's GFR today is 40 3. Is SCr </= 2? Yes.   Patient's SCr is 1.54 mg/dL 4. Did SCr increase >/= 0.5 in 24 hours? No. 5.Pt's weight >40kg  Yes.   6. Abnormal electrolyte(s): K+ 2.9  7. Electrolytes replaced per protocol 8.  Call MD STAT for K+ </= 2.5, Phos </= 1, or Mag </= 1 Physician:  Dr Wallie Char, Lilia Argue 08/15/2023 7:17 AM

## 2023-08-15 NOTE — Progress Notes (Signed)
 Pharmacy Electrolyte Replacement  Recent Labs:  Recent Labs    08/15/23 0539  K 2.9*  MG 1.9  PHOS 3.7  3.7  CREATININE 1.54*    Low Critical Values (K </= 2.5, Phos </= 1, Mg </= 1) Present: None  MD Contacted: N/A Potassium already replaced this morning.   Plan:  Magnesium 2 g IV x 1 Recheck renal function panel in am    Thank you for allowing pharmacy to be a part of this patient's care.  Selinda Eon, PharmD, BCPS Clinical Pharmacist [place] 08/15/2023 8:55 AM

## 2023-08-15 NOTE — IPAL (Signed)
  Interdisciplinary Goals of Care Family Meeting   Date carried out: 08/15/2023  Location of the meeting: Bedside  Member's involved: Physician, Bedside Registered Nurse, Family Member or next of kin, and Other: Dr. Patterson [facility]  Durable Power of Attorney or acting medical decision maker: consensus of children    Discussion: We discussed goals of care for Elizabeth Bush .  Discussed previous plan as outlined in palliative care note.  Continue DNR/DNI status.  No plan to go back on the ventilator after extubation in the case of clinical decline.  All in attendance agreed.  Code status:   Code Status: Limited: Do not attempt resuscitation (DNR) -DNR-LIMITED -Do Not Intubate/DNI    Disposition: Continue current acute care  Time spent for the meeting: 5 minutes    Karren Burly, MD  08/15/2023, 11:15 AM

## 2023-08-15 NOTE — Procedures (Signed)
 Extubation Procedure Note  Patient Details:   Name: Elizabeth Bush DOB: 1970/09/13 MRN: [id number]   Airway Documentation:    Vent end date: 08/15/23 Vent end time: 1142   Evaluation  O2 sats: stable throughout Complications: No apparent complications Patient did tolerate procedure well. Bilateral Breath Sounds: Diminished   Yes  [name] 08/15/2023, 11:43 AM

## 2023-08-16 ENCOUNTER — Other Ambulatory Visit: Payer: Self-pay

## 2023-08-16 ENCOUNTER — Inpatient Hospital Stay (HOSPITAL_COMMUNITY)

## 2023-08-16 ENCOUNTER — Inpatient Hospital Stay (HOSPITAL_COMMUNITY)
Admit: 2023-08-16 | Discharge: 2023-08-16 | Disposition: A | Discharge status: Home / Self Care | Attending: Pulmonary Disease | Admitting: Pulmonary Disease

## 2023-08-16 ENCOUNTER — Other Ambulatory Visit

## 2023-08-16 ENCOUNTER — Encounter

## 2023-08-16 DIAGNOSIS — Z66 Do not resuscitate: Secondary | ICD-10-CM | POA: Diagnosis not present

## 2023-08-16 DIAGNOSIS — R4182 Altered mental status, unspecified: Secondary | ICD-10-CM | POA: Diagnosis not present

## 2023-08-16 DIAGNOSIS — K767 Hepatorenal syndrome: Secondary | ICD-10-CM | POA: Diagnosis not present

## 2023-08-16 DIAGNOSIS — R569 Unspecified convulsions: Secondary | ICD-10-CM | POA: Diagnosis not present

## 2023-08-16 DIAGNOSIS — Z515 Encounter for palliative care: Secondary | ICD-10-CM | POA: Diagnosis not present

## 2023-08-16 DIAGNOSIS — E878 Other disorders of electrolyte and fluid balance, not elsewhere classified: Secondary | ICD-10-CM

## 2023-08-16 DIAGNOSIS — K7682 Hepatic encephalopathy: Secondary | ICD-10-CM | POA: Diagnosis not present

## 2023-08-16 LAB — GLUCOSE, CAPILLARY
Glucose-Capillary: 100 mg/dL — ABNORMAL HIGH (ref 70–99)
Glucose-Capillary: 103 mg/dL — ABNORMAL HIGH (ref 70–99)
Glucose-Capillary: 117 mg/dL — ABNORMAL HIGH (ref 70–99)
Glucose-Capillary: 134 mg/dL — ABNORMAL HIGH (ref 70–99)
Glucose-Capillary: 136 mg/dL — ABNORMAL HIGH (ref 70–99)
Glucose-Capillary: 93 mg/dL (ref 70–99)

## 2023-08-16 LAB — RENAL FUNCTION PANEL
Albumin: 3.1 g/dL — ABNORMAL LOW (ref 3.5–5.0)
Anion gap: 11 (ref 5–15)
BUN: 46 mg/dL — ABNORMAL HIGH (ref 6–20)
CO2: 25 mmol/L (ref 22–32)
Calcium: 9.3 mg/dL (ref 8.9–10.3)
Chloride: 113 mmol/L — ABNORMAL HIGH (ref 98–111)
Creatinine, Ser: 1.46 mg/dL — ABNORMAL HIGH (ref 0.44–1.00)
GFR, Estimated: 43 mL/min — ABNORMAL LOW (ref >60)
Glucose, Bld: 116 mg/dL — ABNORMAL HIGH (ref 70–99)
Phosphorus: 4 mg/dL (ref 2.5–4.6)
Potassium: 3.4 mmol/L — ABNORMAL LOW (ref 3.5–5.1)
Sodium: 149 mmol/L — ABNORMAL HIGH (ref 135–145)

## 2023-08-16 LAB — TRIGLYCERIDES: Triglycerides: 93 mg/dL (ref <150)

## 2023-08-16 LAB — CBC
HCT: 26.5 % — ABNORMAL LOW (ref 36.0–46.0)
Hemoglobin: 8 g/dL — ABNORMAL LOW (ref 12.0–15.0)
MCH: 29.3 pg (ref 26.0–34.0)
MCHC: 30.2 g/dL (ref 30.0–36.0)
MCV: 97.1 fL (ref 80.0–100.0)
Platelets: 68 10*3/uL — ABNORMAL LOW (ref 150–400)
RBC: 2.73 MIL/uL — ABNORMAL LOW (ref 3.87–5.11)
RDW: 22 % — ABNORMAL HIGH (ref 11.5–15.5)
WBC: 20.4 10*3/uL — ABNORMAL HIGH (ref 4.0–10.5)
nRBC: 0.1 % (ref 0.0–0.2)

## 2023-08-16 LAB — CULTURE, RESPIRATORY W GRAM STAIN: Culture: NO GROWTH

## 2023-08-16 LAB — MAGNESIUM: Magnesium: 1.9 mg/dL (ref 1.7–2.4)

## 2023-08-16 MED ORDER — DEXTROSE 5 % IV SOLN
INTRAVENOUS | Status: AC
Start: 2023-08-16 — End: 2023-08-17

## 2023-08-16 MED ORDER — SILVER SULFADIAZINE 1 % EX CREA
TOPICAL_CREAM | Freq: Every day | CUTANEOUS | Status: DC
Start: 2023-08-16 — End: 2023-08-17
  Filled 2023-08-16: qty 50

## 2023-08-16 MED ORDER — THIAMINE HCL 100 MG/ML IJ SOLN
100.0000 mg | Freq: Every day | INTRAMUSCULAR | Status: DC
  Administered 2023-08-16: 100 mg via INTRAVENOUS
  Filled 2023-08-16: qty 2

## 2023-08-16 MED ORDER — MAGNESIUM SULFATE 2 GM/50ML IV SOLN
2.0000 g | Freq: Once | INTRAVENOUS | Status: AC
  Administered 2023-08-16: 2 g via INTRAVENOUS
  Filled 2023-08-16: qty 50

## 2023-08-16 MED ORDER — ACETAMINOPHEN 160 MG/5ML PO SOLN
650.0000 mg | Freq: Four times a day (QID) | ORAL | Status: DC | PRN
  Administered 2023-08-16 – 2023-08-17 (×2): 650 mg
  Filled 2023-08-16 (×2): qty 20.3

## 2023-08-16 MED ORDER — ACETAMINOPHEN 10 MG/ML IV SOLN
1000.0000 mg | Freq: Once | INTRAVENOUS | Status: AC
  Administered 2023-08-16: 1000 mg via INTRAVENOUS
  Filled 2023-08-16: qty 100

## 2023-08-16 MED ORDER — POTASSIUM CHLORIDE 10 MEQ/50ML IV SOLN
10.0000 meq | INTRAVENOUS | Status: AC
  Administered 2023-08-16 (×4): 10 meq via INTRAVENOUS
  Filled 2023-08-16 (×4): qty 50

## 2023-08-16 MED ORDER — ACETAMINOPHEN 325 MG PO TABS
650.0000 mg | ORAL_TABLET | Freq: Four times a day (QID) | ORAL | Status: DC | PRN
  Filled 2023-08-16: qty 2

## 2023-08-16 MED ORDER — FREE WATER
100.0000 mL | Status: DC
Start: 2023-08-16 — End: 2023-08-17
  Administered 2023-08-16 – 2023-08-17 (×4): 100 mL

## 2023-08-16 MED ORDER — METOPROLOL TARTRATE 5 MG/5ML IV SOLN
5.0000 mg | Freq: Once | INTRAVENOUS | Status: AC
  Administered 2023-08-16: 5 mg via INTRAVENOUS
  Filled 2023-08-16: qty 5

## 2023-08-16 MED ORDER — GERHARDT'S BUTT CREAM
TOPICAL_CREAM | Freq: Two times a day (BID) | CUTANEOUS | Status: DC
Start: 2023-08-16 — End: 2023-08-17
  Filled 2023-08-16: qty 60

## 2023-08-16 MED ORDER — VITAL 1.5 CAL PO LIQD
1000.0000 mL | ORAL | Status: DC
  Administered 2023-08-16: 1000 mL
  Filled 2023-08-16 (×2): qty 1000

## 2023-08-16 NOTE — Consult Note (Signed)
 WOC Nurse Consult Note: patient previously consulted on R arm wounds, see consult note 3/25, Xeroform ordered  Reason for Consult: wound care recommendations  Wound type:  Stage 2 Pressure Injury sacrum, moisture, friction and pressure contributing  Pressure Injury POA:  no Measurement: see nursing flowsheet  Wound bed:  100% pink moist   Drainage (amount, consistency, odor)  Periwound: moisture associated skin damage with peeling skin  Dressing procedure/placement/frequency: Cleanse sacrum/buttocks with Vashe wound cleanser [place] [phone]), do not rinse and allow to air dry. Apply Xeroform gauze to wound bed daily. Coat surrounding skin with Gerhardt's Butt Cream.  Cover with silicone foam or ABD pad whichever is preferred.   POC discussed with bedside nurse. WOC team will not follow. RE-consult if further needs arise.   Thank you,    Priscella Mann MSN, RN-BC, [employer] [phone]
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
## 2023-08-16 NOTE — Progress Notes (Signed)
 Attempted to see Elizabeth Bush for support and possible HCPOA paperwork, but she was resting. Will attempt at a later time.

## 2023-08-16 NOTE — Progress Notes (Signed)
 Daily Progress Note   Patient Name: Elizabeth Bush       Date: 08/16/2023 DOB: 1970-06-17  Age: 53 y.o. MRN#: [id number] Attending Physician: Karren Burly, MD Primary Care Physician: Marcine Matar, MD Admit Date: 08/07/2023 Length of Stay: 9 days  Reason for Consultation/Follow-up: Establishing goals of care  Subjective:   CC: Patient laying in bed awake, though not speaking.  Following up regarding complex medical decision making.  Subjective:  Reviewed EMR prior to presenting to bedside.  Patient was extubated on 08/15/2023 and has maintained on room air since that time.  Discussed care with PCCM provider and SLP.  Patient to have speech evaluation today, potential MBS as well.  Presented to bedside to see patient.  Patient laying in bed awake though not speaking.  Patient at times attempting to mumble.  Discussed care with RN at bedside.  Patient appeared comfortable without any signs of distress.  Again spoke with PCCM provider who was able to call patient's family to update.  Discussed that should patient need a core track for nutrition assistance, would recommend discussion with family that this is for a time limited trial with hope patient can regain her own strength to participate with eating.  Children have discussed how important patient's quality of life would be moving forward including not being continuously attached to machines.  With patient's liver failure and ascites and high infection risk, would not be appropriate for long-term nutrition for source such as PEG tube or TPN.  PCCM provider to consider if further imaging for workup needed.  Palliative medicine team to continue following along with patient's medical journey.  Objective:   Vital Signs:  BP 128/60   Pulse (!) 116   Temp 99.3 F (37.4 C)   Resp 20   Ht 5' 7.01" (1.702 m)   Wt 116.6 kg   LMP  (LMP Unknown)   SpO2 98%   BMI 40.25 kg/m   Physical Exam: General:Awake, chronically  ill-appearing, not speaking Cardiovascular: Tachycardia noted Respiratory: No increased respiratory work noted Neuro: Awake, not able to speak at this time  Imaging: I personally reviewed recent imaging.   Assessment & Plan:   Assessment: Patient is a 53 year old female with a past medical history of EtOH cirrhosis/hepatitis, depression, stage II decubitus ulcer present on arrival, and hypertension who was admitted on 08/07/2023 due to ongoing bleeding and increasing confusion. 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 Upon admission, patient noted to have acute hypoxic respiratory failure secondary to pneumonia, acute encephalopathy in the setting of EtOH cirrhosis, acute blood loss anemia, AKI, and septic shock. Patient has received aggressive medical management in the ICU to support this while patient remains on ventilator support. GI and nephrology consulted for recommendations. Palliative medicine team consulted to assist with complex medical decision making.   Recommendations/Plan: # Complex medical decision making/goals of care:   -Patient unable to participate in complex medical decision-making due to current medical status.                -Have had multiple discussions with patient's 3 children (2 daughters and 1 son) regarding goals of care moving forward.  Patient was extubated on 08/16/2023; noted would not be reintubated.  CODE STATUS remains DNR/DNI.  At this time patient's nutrition is primary concern.  SLP evaluating and further recommendations as per PCCM providers.  Have discussed recommendation  that should patient be recommended to have nutritional support such as core track, this should be set with a time-limited trial as children have continued to discuss how patient's quality of life was very important to her.  Have expressed concern  that patient would not be a candidate for long-term nutritional support such as PEG tube or TPN due to hepatorenal syndrome with ascites and high infection risk.  Had already discussed with children that should patient deteriorate at any time, need to have further discussions about full transition to comfort focused care.  Palliative medicine team continuing to follow along to engage in conversations as able.                -Have already inquired if patient had completed ACP documentation and though this has been discussed, has not been completed. [name] noted patient is not married.  Discussed based on [place] laws if patient does not have healthcare power of attorney and is not married, medical decision making would fall to majority of reasonably available parents and children over the age of 66.           Code Status: Limited: Do not attempt resuscitation (DNR) -DNR-LIMITED -Do Not Intubate/DNI   # Psycho-social/Spiritual Support:  - Support System: 2 daughters, 1 son   # Discharge Planning:  To Be Determined   Discussed with: RN, PCCM provider, patient, SLP  Thank you for allowing the palliative care team to participate in the care Eshani M Hoggard.  Alvester Morin, DO Palliative Care Provider PMT # [phone]  If patient remains symptomatic despite maximum doses, please call PMT at [phone] between 0700 and 1900. Outside of these hours, please call attending, as PMT does not have night coverage.  Personally spent 35 minutes in patient care including extensive chart review (labs, imaging, progress/consult notes, vital signs), medically appropraite exam, discussed with treatment team, education to patient, family, and staff, documenting clinical information, medication review and management, coordination of care, and available advanced directive documents.

## 2023-08-16 NOTE — Progress Notes (Addendum)
 eLink Physician-Brief Progress Note Patient Name: Elizabeth Bush DOB: 01-Jul-1970 MRN: [id number]   Date of Service  08/16/2023  HPI/Events of Note  Blood glucose levels trending low.  She is n.p.o. following extubation.  Current CBG is 81.  eICU Interventions  D5W ordered to prevent hypoglycemia.  Discussed with bedside RN.     Intervention Category Intermediate Interventions: Other: (Hypoglycemia)  Addendum: Patient developed SVT with heart rate in the 130s sustained. Will obtain twelve-lead EKG to ascertain if it is sinus tachycardia or A-fib.  Order placed.  RN informed.  Carilyn Goodpasture 08/16/2023, 1:49 AM

## 2023-08-16 NOTE — Procedures (Signed)
 Patient Name: Elizabeth Bush  MRN: [id number]  Epilepsy Attending: Charlsie Quest  Referring Physician/Provider: Kalman Shan, MD  Date: 08/16/2023 Duration: 23.50 mins  Patient history: 53 year old female with altered mental status.  EEG to evaluate for seizure.  Level of alertness: Awake/ lethargic   AEDs during EEG study: None  Technical aspects: This EEG study was done with scalp electrodes positioned according to the 10-20 International system of electrode placement. Electrical activity was reviewed with band pass filter of 1-70Hz , sensitivity of 7 uV/mm, display speed of 59mm/sec with a 60Hz  notched filter applied as appropriate. EEG data were recorded continuously and digitally stored.  Video monitoring was available and reviewed as appropriate.  Description: No clear posterior dominant rhythm was seen.  EEG showed continuous generalized 3-6 hertz theta-delta slowing.  Hyperventilation and photic stimulation were not performed.     ABNORMALITY - Continuous slow, generalized  IMPRESSION: This study is suggestive of moderate diffuse encephalopathy. No seizures or epileptiform discharges were seen throughout the recording.  Saud Bail Annabelle Harman
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
## 2023-08-16 NOTE — Patient Outreach (Signed)
 Cancelling appointment due to patient being inpatient.   Elizabeth Bush, MHA [facility]  [facility] Social Worker [phone]
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
## 2023-08-16 NOTE — Progress Notes (Signed)
 EEG complete - results pending

## 2023-08-16 NOTE — Procedures (Signed)
 Modified Barium Swallow Study  Patient Details  Name: Elizabeth Bush MRN: [id number] Date of Birth: 1971-04-26  Today's Date: 08/16/2023  Modified Barium Swallow completed.  Full report located under Chart Review in the Imaging Section.  History of Present Illness Patient is a 53 y.o. female with PMH: left TKA, depression, HTN, ETOH use disorder. She had gastritis related to H. pylori in 2022. She was recently admitted last month and discharged 06/27/23 for acute on chronic blood loss anemia, associated lower GI bleed with EGD being unremarkable and colonoscopy showing friable hemorrhoids. She was diagnosed with AKI with possible cirrhosis and possible alcoholic hepatitis. She presented to the ER at [facility] on 08/07/23 due to ongoing bleeding since prior discharge. She refused to come to the hospital in morning and had increasing confusion. She was obtunded with stage II decub and was promptly intubated. She underwent a one-way extubation on 3/30 and has been NPO. Palliative is following.   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 She exhibited oral holding and delayed anterior to posterior transit of all PO's. Swallow initiated at level of pyriform sinus with all tested consistencies (honey thick, nectar thick, thin, puree). She exhibited partial anterior hyoid movement, partial laryngeal elevation but complete epiglottic inversion. When swallow was initiated, PES opening was [facility] and only trace to minimal amount of residuals observed on base of tongue and in vallecular sinus. Aspiration event suspected with thin liquids but occured when fluoroscopy was turned off, and was likely from barium flowing over from pyriform sinus prior to swallow initiation. In addition, approximately midway through study, patient with frequent throat clearing and SLP suspecting this to be from pharyngeal secretions. Patient is not safe for PO's at this time. SLP  recommending continue NPO but allow for PRN small sips of water after oral care and with supervision from Nursing. Factors that may increase risk of adverse event in presence of aspiration [employer] 2021): Reduced cognitive function;Frail or deconditioned;Poor general health and/or compromised immunity  Swallow Evaluation Recommendations Recommendations: NPO;Free water protocol after oral care Liquid Administration via: Cup;Spoon Medication Administration: Via alternative means Supervision: Full assist for feeding Swallowing strategies  : Small bites/sips;Slow rate Oral care recommendations: Oral care QID (4x/day);Staff/trained caregiver to provide oral care;Oral care before ice chips/water      Angela Nevin, MA, CCC-SLP Speech Therapy

## 2023-08-16 NOTE — Progress Notes (Signed)
 Nutrition Follow-up  DOCUMENTATION CODES:   Not applicable  INTERVENTION:  - Once NGT placed and xray verified, recommend initiating TF as below: Osmolite 1.5 at 55 ml/h (1320 ml per day) *Would recommend starting at 32mL/hr and advancing by 10mL Q12H Prosource TF20 60 ml daily Provides 2060 kcal, 103 gm protein, 1006 ml free water daily  - Recommend Q4H FWF ( ) to provide a total of 1939mL/day  - Monitor magnesium, potassium, and phosphorus BID for at least 3 days, MD to replete as needed, as pt is at risk for refeeding syndrome.  - Monitor weight trends.   NUTRITION DIAGNOSIS:   Inadequate oral intake related to inability to eat as evidenced by NPO status. *ongoing  GOAL:   Patient will meet greater than or equal to 90% of their needs *not met  MONITOR:   Vent status, TF tolerance, Labs, Weight trends  REASON FOR ASSESSMENT:   Consult Assessment of nutrition requirement/status  ASSESSMENT:   53 y.o. female presented ot the ED with increased confusion and ongoing bleeding since previous admission during February. PMH includes GERD, depression, HTN, EtOH abuse, and anemia. Pt admitted with acute respiratory failure 2/2 PNA, acute encephalopathy, and concern for sepsis.  3/22 - Admitted; Intubated  3/24 - Trickle TF started  3/25 - Began advancing TF 3/26 - TF's stopped due to white/bile secretions from bite block 3/28 - Trickle tube feeds restarted 3/30 - One-way extubation  Patient extubated yesterday, on room air today. Per chart review, patient oriented to self only with difficult to understand speech.  Failed SLP eval today. Had MBS after initial bedside eval but recs still pending.  NGT/Cortrak ordered by CCM. Tube has not been placed yet. Will provide tube feed recommendations.   Palliative care continues to follow patient. Note today indicates NGT/Cortrak will be a time limited trial with hope patient can regain her strength. Palliative care  recommending against long term nutrition such as PEG.    Admit weight: 224# Current weight: 257# I&O's: +6.6L + for deep pitting generalized edema and moderate to deep pitting edema to left and right upper and lower extremities  Medications reviewed and include: Lactulose QID, 100mg  thiamine, D5 @ 33mL/hr (provides 163 kcals over 24 hours)  Labs reviewed:  Na 149 K+ 3.4 Creatinine 1.46   Diet Order:   Diet Order             Diet NPO time specified  Diet effective now                   EDUCATION NEEDS:  Not appropriate for education at this time  Skin:  Skin Assessment: Skin Integrity Issues: Skin Integrity Issues:: Stage II Stage II: R Buttocks  Last BM:  3/30 - rectal tube  Height:  Ht Readings from Last 1 Encounters:  08/12/23 5' 7.01" (1.702 m)   Weight:  Wt Readings from Last 1 Encounters:  08/15/23 116.6 kg   Ideal Body Weight:  61.4 kg  BMI:  Body mass index is 40.25 kg/m.  Estimated Nutritional Needs:  Kcal:  1850-2100 kcals Protein:  100-125 grams Fluid:  >/= 1.9L    Shelle Iron RD, LDN Contact via Secure Chat.

## 2023-08-17 DIAGNOSIS — R6521 Severe sepsis with septic shock: Secondary | ICD-10-CM | POA: Diagnosis not present

## 2023-08-17 DIAGNOSIS — A419 Sepsis, unspecified organism: Secondary | ICD-10-CM | POA: Diagnosis not present

## 2023-08-17 DIAGNOSIS — R0600 Dyspnea, unspecified: Secondary | ICD-10-CM

## 2023-08-17 LAB — RENAL FUNCTION PANEL
Albumin: 2.6 g/dL — ABNORMAL LOW (ref 3.5–5.0)
Anion gap: 13 (ref 5–15)
BUN: 46 mg/dL — ABNORMAL HIGH (ref 6–20)
CO2: 22 mmol/L (ref 22–32)
Calcium: 8.9 mg/dL (ref 8.9–10.3)
Chloride: 114 mmol/L — ABNORMAL HIGH (ref 98–111)
Creatinine, Ser: 1.6 mg/dL — ABNORMAL HIGH (ref 0.44–1.00)
GFR, Estimated: 38 mL/min — ABNORMAL LOW (ref >60)
Glucose, Bld: 155 mg/dL — ABNORMAL HIGH (ref 70–99)
Phosphorus: 3.6 mg/dL (ref 2.5–4.6)
Potassium: 3.4 mmol/L — ABNORMAL LOW (ref 3.5–5.1)
Sodium: 149 mmol/L — ABNORMAL HIGH (ref 135–145)

## 2023-08-17 LAB — BASIC METABOLIC PANEL WITH GFR
Anion gap: 14 (ref 5–15)
BUN: 47 mg/dL — ABNORMAL HIGH (ref 6–20)
CO2: 21 mmol/L — ABNORMAL LOW (ref 22–32)
Calcium: 8.9 mg/dL (ref 8.9–10.3)
Chloride: 114 mmol/L — ABNORMAL HIGH (ref 98–111)
Creatinine, Ser: 1.52 mg/dL — ABNORMAL HIGH (ref 0.44–1.00)
GFR, Estimated: 41 mL/min — ABNORMAL LOW (ref >60)
Glucose, Bld: 154 mg/dL — ABNORMAL HIGH (ref 70–99)
Potassium: 3.4 mmol/L — ABNORMAL LOW (ref 3.5–5.1)
Sodium: 149 mmol/L — ABNORMAL HIGH (ref 135–145)

## 2023-08-17 LAB — CBC
HCT: 22.9 % — ABNORMAL LOW (ref 36.0–46.0)
Hemoglobin: 7 g/dL — ABNORMAL LOW (ref 12.0–15.0)
MCH: 29.5 pg (ref 26.0–34.0)
MCHC: 30.6 g/dL (ref 30.0–36.0)
MCV: 96.6 fL (ref 80.0–100.0)
Platelets: 75 10*3/uL — ABNORMAL LOW (ref 150–400)
RBC: 2.37 MIL/uL — ABNORMAL LOW (ref 3.87–5.11)
RDW: 22 % — ABNORMAL HIGH (ref 11.5–15.5)
WBC: 19.9 10*3/uL — ABNORMAL HIGH (ref 4.0–10.5)
nRBC: 0.1 % (ref 0.0–0.2)

## 2023-08-17 LAB — PREPARE RBC (CROSSMATCH)

## 2023-08-17 LAB — MAGNESIUM: Magnesium: 2.1 mg/dL (ref 1.7–2.4)

## 2023-08-17 LAB — GLUCOSE, CAPILLARY: Glucose-Capillary: 136 mg/dL — ABNORMAL HIGH (ref 70–99)

## 2023-08-17 MED ORDER — SODIUM CHLORIDE 0.9% IV SOLUTION: Freq: Once | INTRAVENOUS | Status: AC

## 2023-08-17 MED ORDER — ACETAMINOPHEN 650 MG RE SUPP: 650.0000 mg | Freq: Four times a day (QID) | RECTAL | Status: DC | PRN

## 2023-08-17 MED ORDER — GLYCOPYRROLATE 0.2 MG/ML IJ SOLN: 0.2000 mg | INTRAMUSCULAR | Status: DC | PRN

## 2023-08-17 MED ORDER — MORPHINE 100MG IN NS 100ML (1MG/ML) PREMIX INFUSION
0.0000 mg/h | INTRAVENOUS | Status: DC
  Administered 2023-08-17: 5 mg/h via INTRAVENOUS
  Filled 2023-08-17: qty 100

## 2023-08-17 MED ORDER — IOHEXOL 9 MG/ML PO SOLN
500.0000 mL | ORAL | Status: AC
Start: 2023-08-17 — End: 2023-08-17

## 2023-08-17 MED ORDER — EPINEPHRINE HCL 5 MG/250ML IV SOLN IN NS
0.5000 ug/min | INTRAVENOUS | Status: DC
  Administered 2023-08-17: 0.5 ug/min via INTRAVENOUS
  Filled 2023-08-17: qty 250

## 2023-08-17 MED ORDER — NOREPINEPHRINE 4 MG/250ML-% IV SOLN
INTRAVENOUS | Status: AC
Start: 2023-08-17 — End: 2023-08-17
  Filled 2023-08-17: qty 250

## 2023-08-17 MED ORDER — ACETAMINOPHEN 325 MG PO TABS: 650.0000 mg | ORAL_TABLET | Freq: Four times a day (QID) | ORAL | Status: DC | PRN

## 2023-08-17 MED ORDER — IOHEXOL 9 MG/ML PO SOLN
ORAL | Status: AC
  Filled 2023-08-17: qty 1000

## 2023-08-17 MED ORDER — LORAZEPAM 2 MG/ML IJ SOLN: 0.5000 mg | INTRAMUSCULAR | Status: DC | PRN

## 2023-08-17 MED ORDER — LACTATED RINGERS IV BOLUS
500.0000 mL | Freq: Once | INTRAVENOUS | Status: AC
  Administered 2023-08-17: 500 mL via INTRAVENOUS

## 2023-08-17 MED ORDER — MORPHINE BOLUS VIA INFUSION
5.0000 mg | INTRAVENOUS | Status: DC | PRN
  Administered 2023-08-17 (×3): 5 mg via INTRAVENOUS

## 2023-08-17 MED ORDER — NOREPINEPHRINE 4 MG/250ML-% IV SOLN
0.0000 ug/min | INTRAVENOUS | Status: DC
  Administered 2023-08-17: 60 ug/min via INTRAVENOUS
  Administered 2023-08-17: 2 ug/min via INTRAVENOUS
  Filled 2023-08-17 (×2): qty 250

## 2023-08-17 MED ORDER — CALCIUM GLUCONATE-NACL 1-0.675 GM/50ML-% IV SOLN: 1.0000 g | Freq: Once | INTRAVENOUS | Status: DC

## 2023-08-17 MED ORDER — MIDAZOLAM HCL 2 MG/2ML IJ SOLN
2.0000 mg | INTRAMUSCULAR | Status: DC | PRN
  Administered 2023-08-17: 2 mg via INTRAVENOUS
  Filled 2023-08-17: qty 2

## 2023-08-17 MED ORDER — VASOPRESSIN 20 UNITS/100 ML INFUSION FOR SHOCK
0.0000 [IU]/min | INTRAVENOUS | Status: DC
  Administered 2023-08-17: 0.04 [IU]/min via INTRAVENOUS
  Filled 2023-08-17: qty 100

## 2023-08-17 MED ORDER — POLYVINYL ALCOHOL 1.4 % OP SOLN: 1.0000 [drp] | Freq: Four times a day (QID) | OPHTHALMIC | Status: DC | PRN

## 2023-08-17 MED ORDER — GLYCOPYRROLATE 1 MG PO TABS: 1.0000 mg | ORAL_TABLET | ORAL | Status: DC | PRN

## 2023-08-17 MED ORDER — POTASSIUM CHLORIDE 10 MEQ/50ML IV SOLN: 10.0000 meq | INTRAVENOUS | Status: AC

## 2023-08-17 DEATH — deceased

## 2023-08-21 LAB — BPAM RBC
Blood Product Expiration Date: 202505012359
Blood Product Expiration Date: 202505012359
Blood Product Expiration Date: 202505022359
Blood Product Expiration Date: 202505072359
Blood Product Expiration Date: 202505082359
ISSUE DATE / TIME: 202504010709
ISSUE DATE / TIME: 202504010750
ISSUE DATE / TIME: 202504010909
Unit Type and Rh: 7300
Unit Type and Rh: 7300
Unit Type and Rh: 7300
Unit Type and Rh: 9500
Unit Type and Rh: 9500

## 2023-08-21 LAB — TYPE AND SCREEN
ABO/RH(D): B POS
Antibody Screen: NEGATIVE
Unit division: 0
Unit division: 0
Unit division: 0
Unit division: 0
Unit division: 0

## 2023-08-25 ENCOUNTER — Telehealth: Payer: Self-pay | Admitting: Physician Assistant

## 2023-09-16 NOTE — Progress Notes (Signed)
  Daily Progress Note   Patient Name: Elizabeth Bush       Date: 09/06/2023 DOB: 1970-10-26  Age: 53 y.o. MRN#: [id number] Attending Physician: Tomma Lightning, MD Primary Care Physician: Marcine Matar, MD Admit Date: 08/07/2023 Length of Stay: 10 days  Reason for Consultation/Follow-up: Establishing goals of care  Subjective:   CC: Patient l continues to be hemodynamically unstable, on 3 different types of vasopressors, displaying nonverbal gestures of distress and discomfort with air hunger evident, nursing staff supporting the family 2 daughters and 1 son who are present at the bedside.  Comfort cart has been ordered, shifting focus to prioritize comfort measures is taking place Following up regarding complex medical decision making.  Subjective:  Reviewed EMR prior to presenting to bedside.  Overnight events noted. Family present at bedside Appreciate nursing colleagues present at bedside  Palliative medicine team to continue following along with patient's medical journey.  Objective:   Vital Signs:  BP (!) 102/50   Pulse (!) 156   Temp 100 F (37.8 C) (Bladder)   Resp (!) 43   Ht 5' 7.01" (1.702 m)   Wt 116.6 kg   LMP  (LMP Unknown)   SpO2 100%   BMI 40.25 kg/m   Physical Exam: General:Awake, chronically ill-appearing, not able to speak due to air hunger cardiovascular: Tachycardia noted, monitor noted Respiratory: Moderate to severe increased respiratory work noted Neuro: Awake, not able to speak at this time  Imaging: I personally reviewed recent imaging.   Assessment & Plan:   Assessment: Patient is a 53 year old female with a past medical history of EtOH cirrhosis/hepatitis, depression, stage II decubitus ulcer present on arrival, and hypertension who was admitted on 08/07/2023 due to ongoing bleeding and increasing confusion. Of note patient has had multiple recent admissions for acute on chronic blood loss anemia with colonoscopy showing friable  hemorrhoids, management of AKI, and possible alcoholic hepatitis. Upon admission, patient noted to have acute hypoxic respiratory failure secondary to pneumonia, acute encephalopathy in the setting of EtOH cirrhosis, acute blood loss anemia, AKI, and septic shock. Patient has received aggressive medical management in the ICU to support this while patient remains on ventilator support. GI and nephrology consulted for recommendations. Palliative medicine team consulted to assist with complex medical decision making.   Recommendations/Plan: # Complex medical decision making/goals of care:   -Patient appears to be in moderate to severe distress, displaying nonverbal signs of discomfort, air hunger.  Family present at bedside. DNR/DNI, comfort measures Is on 3 different types of vasopressors Continues to be hemodynamically unstable            Code Status: Do not attempt resuscitation (DNR) - Comfort care  # Psycho-social/Spiritual Support:  - Support System: 2 daughters, 1 son-they are present at the bedside   # Discharge Planning: Anticipate hospital death  Discussed with: RN, patient and family present at bedside Thank you for allowing the palliative care team to participate in the care Rosiland M Djordjevic.  MOD MDM Rosalin Hawking, MD Palliative Care Provider PMT # [phone]  If patient remains symptomatic despite maximum doses, please call PMT at [phone] between 0700 and 1900. Outside of these hours, please call attending, as PMT does not have night coverage.
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
## 2023-09-16 NOTE — Progress Notes (Addendum)
 eLink Physician-Brief Progress Note Patient Name: YVETTA DROTAR DOB: [birth year] MRN: [id number]   Date of Service  08/24/2023  HPI/Events of Note  New abdominal pain with bloody stools noted in flexiseal when previously brown stool  Increased tachycardia 130. BP stable with SBP 120s  AM CBC pending  Has had prior  colonoscopy with hemorrhoids 06/24/23  eICU Interventions  LR bolus 500 cc now F/u CBC Already on PPI BID Defer to day team to re-engage GI if clinically worsening   08/20/2023 New hypotension with SBP 70s. STAT PRBC x 1 ordered. RN to contact blood bank to pick up  6:50 AM Worsening hypotension. Levophed started. Additional 500 cc LR. Notified day team regarding critical illness  Intervention Category Intermediate Interventions: Bleeding - evaluation and treatment with blood products  Ege Muckey Mechele Collin 09/05/2023, 6:08 AM

## 2023-09-16 NOTE — Plan of Care (Signed)
  Problem: Metabolic: Goal: Ability to maintain appropriate glucose levels will improve Outcome: Progressing   Problem: Nutritional: Goal: Maintenance of adequate nutrition will improve Outcome: Progressing   Problem: Clinical Measurements: Goal: Respiratory complications will improve Outcome: Progressing   Problem: Education: Goal: Knowledge of General Education information will improve Description: Including pain rating scale, medication(s)/side effects and non-pharmacologic comfort measures Outcome: Not Progressing   Problem: Clinical Measurements: Goal: Cardiovascular complication will be avoided Outcome: Not Progressing

## 2023-09-16 NOTE — Death Summary Note (Signed)
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 gradient measures 13.4 mmHg. Aortic valve area, by VTI measures 2.46 cm. Pulmonic Valve: The pulmonic valve was not well visualized. Pulmonic valve regurgitation is not visualized. No evidence of pulmonic stenosis. Aorta: The aortic root and ascending aorta are structurally normal, with no evidence of dilitation. IAS/Shunts: The atrial septum is grossly normal. Additional Comments: 3D was performed not requiring image post processing on an independent workstation and was indeterminate.  LEFT VENTRICLE PLAX 2D LVIDd:         5.00 cm   Diastology LVIDs:         3.30 cm   LV e' medial:    6.09 cm/s LV PW:         0.90 cm   LV E/e' medial:  12.8 LV IVS:        0.80 cm   LV e' lateral:   7.18 cm/s LVOT diam:     2.10 cm   LV E/e' lateral: 10.8 LV SV:         83 LV SV Index:   36 LVOT Area:     3.46 cm  RIGHT VENTRICLE             IVC RV S prime:     15.70 cm/s  IVC diam: 1.90 cm TAPSE (M-mode): 2.4 cm LEFT ATRIUM             Index        RIGHT ATRIUM           Index LA diam:        3.10 cm 1.33 cm/m   RA Area:     12.30 cm LA Vol (A2C):   40.2 ml 17.21 ml/m  RA Volume:   27.80 ml  11.90 ml/m LA Vol (A4C):   33.5 ml 14.34 ml/m LA Biplane Vol: 38.0 ml 16.27 ml/m  AORTIC VALVE AV Area (Vmax):    2.38 cm AV Area (Vmean):   2.28 cm AV Area (VTI):     2.46 cm AV Vmax:           183.00 cm/s AV Vmean:           125.000 cm/s AV VTI:            0.337 m AV Peak Grad:      13.4 mmHg AV Mean Grad:      7.0 mmHg LVOT Vmax:         126.00 cm/s LVOT Vmean:        82.175 cm/s LVOT VTI:          0.240 m LVOT/AV VTI ratio: 0.71  AORTA Ao Root diam: 3.10 cm Ao Asc diam:  3.70 cm MITRAL VALVE               TRICUSPID VALVE MV Area (PHT): 3.50 cm    TR Peak grad:   22.3 mmHg MV Decel Time: 217 msec    TR Vmax:        236.00 cm/s MV E velocity: 77.75 cm/s MV A velocity: 97.65 cm/s  SHUNTS MV E/A ratio:  0.80        Systemic VTI:  0.24 m  Systemic Diam: 2.10 cm Riley Lam MD Electronically signed by Riley Lam MD Signature Date/Time: 08/10/2023/6:02:03 PM    Final    VAS [place] UPPER EXTREMITY VENOUS DUPLEX Result Date: 08/10/2023 UPPER VENOUS STUDY  Patient Name:  JULIANI LADUKE Rapaport  Date of Exam:   08/10/2023 Medical Rec #: [id number]      Accession #:    [id number] Date of Birth: 06/12/1970       Patient Gender: F Patient Age:   3 years Exam Location:  [facility] Procedure:      VAS [place] UPPER EXTREMITY VENOUS DUPLEX Referring Phys: Emilie Rutter --------------------------------------------------------------------------------  Indications: Edema Risk Factors: None identified. Limitations: Poor ultrasound/tissue interface, bandages, open wound, line and patient positioning, patient immobility, vent. Comparison Study: No prior studies. Performing Technologist: Chanda Busing RVT  Examination Guidelines: A complete evaluation includes B-mode imaging, spectral Doppler, color Doppler, and power Doppler as needed of all accessible portions of each vessel. Bilateral testing is considered an integral part of a complete examination. Limited examinations for reoccurring indications may be performed as noted.  Right Findings: +----------+------------+---------+-----------+----------+-------+ RIGHT     CompressiblePhasicitySpontaneousPropertiesSummary  +----------+------------+---------+-----------+----------+-------+ IJV           Full       Yes       Yes                      +----------+------------+---------+-----------+----------+-------+ Subclavian    Full       Yes       Yes                      +----------+------------+---------+-----------+----------+-------+ Axillary      Full       Yes       Yes                      +----------+------------+---------+-----------+----------+-------+ Brachial      Full                                          +----------+------------+---------+-----------+----------+-------+ Radial        Full                                          +----------+------------+---------+-----------+----------+-------+ Ulnar         Full                                          +----------+------------+---------+-----------+----------+-------+ Cephalic      Full                                          +----------+------------+---------+-----------+----------+-------+ Basilic       Full                                          +----------+------------+---------+-----------+----------+-------+  Left Findings: +----------+------------+---------+-----------+----------+-------+ LEFT      CompressiblePhasicitySpontaneousPropertiesSummary +----------+------------+---------+-----------+----------+-------+ IJV  Full       Yes       Yes                      +----------+------------+---------+-----------+----------+-------+ Subclavian    Full       Yes       Yes                      +----------+------------+---------+-----------+----------+-------+ Axillary      Full       Yes       Yes                      +----------+------------+---------+-----------+----------+-------+ Brachial      Full                                          +----------+------------+---------+-----------+----------+-------+ Radial        Full                                           +----------+------------+---------+-----------+----------+-------+ Ulnar         Full                                          +----------+------------+---------+-----------+----------+-------+ Cephalic      Full                                          +----------+------------+---------+-----------+----------+-------+ Basilic       Full                                          +----------+------------+---------+-----------+----------+-------+  Summary:  Right: No evidence of deep vein thrombosis in the upper extremity. No evidence of superficial vein thrombosis in the upper extremity.  Left: No evidence of deep vein thrombosis in the upper extremity. No evidence of superficial vein thrombosis in the upper extremity.  *See table(s) above for measurements and observations.  Diagnosing physician: Carolynn Sayers Electronically signed by Carolynn Sayers on 08/10/2023 at 12:19:16 PM.    Final    DG Chest Port 1 View Result Date: 08/09/2023 CLINICAL DATA:  Endotracheal tube positioning. EXAM: PORTABLE CHEST 1 VIEW COMPARISON:  Radiographs earlier the same date and 08/08/2023. CT 07/24/2020. FINDINGS: Tip of the endotracheal tube overlies the mid trachea, just above the top of the aortic arch, approximately 5.8 cm above the carina. Enteric tube projects below the diaphragm, tip not visualized. The heart size and mediastinal contours are stable. No significant change in bilateral perihilar airspace opacities. No evidence of pneumothorax or significant pleural effusion. Old rib fracture noted on the left. IMPRESSION: Satisfactory position of endotracheal tube. No significant change in bilateral perihilar airspace opacities. Electronically Signed   By: Carey Bullocks M.D.   On: 08/09/2023 12:37   [place] EKG SITE RITE Result Date: 08/09/2023 If Site Rite image not attached, placement could not be confirmed due to current cardiac rhythm.  DG CHEST PORT  1 VIEW Result Date: 08/09/2023 CLINICAL  DATA:  [id number].  Endotracheal tube present. EXAM: PORTABLE CHEST 1 VIEW COMPARISON:  Portable chest yesterday at 5:51 p.m. FINDINGS: 4:16 a.m. ETT is not seen on today's exam and has either been removed or pulled back above the plane of the study. NGT is well placed terminating in the distal body of stomach. Stable cardiomegaly and central vascular fullness. Patchy dense opacities of the left-greater-than-right lungs demonstrate no interval improvement or worsening. There is a small underlying left pleural effusion. The mediastinum is stable. No new osseous findings. There is a tangle of overlying monitor wires. IMPRESSION: 1. ETT is not seen on today's exam and has either been removed or pulled back above the plane of the study. 2. NGT is well placed terminating in the distal body of stomach. 3. Stable cardiomegaly and central vascular fullness. 4. Patchy dense opacities of the left-greater-than-right lungs demonstrate no interval improvement or worsening. Small left pleural effusion. Electronically Signed   By: Almira Bar M.D.   On: 08/09/2023 05:54   DG CHEST PORT 1 VIEW Result Date: 08/08/2023 CLINICAL DATA:  Check endotracheal tube placement EXAM: PORTABLE CHEST 1 VIEW COMPARISON:  08/07/2023 FINDINGS: Endotracheal tube and gastric catheter are noted in satisfactory position. Cardiac shadow is stable. Patchy bilateral airspace opacities are again identified stable from the prior exam. No new focal abnormality is noted. IMPRESSION: No significant change from the previous day. Electronically Signed   By: Alcide Clever M.D.   On: 08/08/2023 20:02   US RENAL Result Date: 08/08/2023 CLINICAL DATA:  Acute kidney injury EXAM: RENAL / URINARY TRACT ULTRASOUND COMPLETE COMPARISON:  06/20/2023 CT scan FINDINGS: Right Kidney: Renal measurements: 10.3 by 5.0 by 4.7 cm = volume: 125 mL. Echogenicity within normal limits. No mass or hydronephrosis visualized. The right kidney upper pole cyst shown on the CT from  06/20/2023 was not well characterized. Left Kidney: Renal measurements: 9.9 by 4.9 by 5.3 cm = volume: 133 mL. Echogenicity within normal limits. No mass or hydronephrosis visualized. A 1.5 by 1.5 by 1.8 cm upper pole cyst is present. No further imaging workup of this lesion is indicated. Bladder: Appears normal for degree of bladder distention. Foley catheter in the urinary bladder. Other: Substantial ascites noted along with left pleural effusion. Limitations: Body habitus, overlying bowel gas, patient unable to assist fully with positioning. IMPRESSION: 1. No hydronephrosis. 2. Substantial ascites and left pleural effusion. 3. Foley catheter in the urinary bladder. Electronically Signed   By: Gaylyn Rong M.D.   On: 08/08/2023 14:40   DG Abd Portable 1V Result Date: 08/07/2023 CLINICAL DATA:  OG tube placement EXAM: PORTABLE ABDOMEN - 1 VIEW COMPARISON:  08/07/2023 FINDINGS: Enteric tube tip and side port overlie the proximal stomach. Upper gas pattern is unremarkable. IMPRESSION: Enteric tube tip and side port overlie the proximal stomach. Electronically Signed   By: Jasmine Pang M.D.   On: 08/07/2023 20:30   DG CHEST PORT 1 VIEW Result Date: 08/07/2023 CLINICAL DATA:  Intubated EXAM: PORTABLE CHEST 1 VIEW COMPARISON:  08/07/2023 FINDINGS: Endotracheal tube tip is about 4.6 cm superior to the carina. Enteric tube tip below the diaphragm but incompletely assessed. Cardiac enlargement. No significant change in heterogeneous bilateral perihilar consolidations. IMPRESSION: Endotracheal tube tip about 4.6 cm superior to the carina. No significant change in heterogeneous bilateral perihilar consolidations. Electronically Signed   By: Jasmine Pang M.D.   On: 08/07/2023 20:29   US Abdomen Limited Result Date: 08/07/2023 CLINICAL DATA:  Cirrhosis  EXAM: ULTRASOUND ABDOMEN LIMITED RIGHT UPPER QUADRANT COMPARISON:  CT 06/20/2023 FINDINGS: Gallbladder: No shadowing stones. Upper normal wall thickness.  Negative sonographic Murphy's. Common bile duct: Diameter: 4 mm Liver: Diffuse increased echogenicity limits assessment for parenchymal abnormality. Mild surface nodularity corresponding to history of cirrhosis. Portal vein is poorly assessed due to habitus and marked fatty infiltration of the liver with poorly visible portal vessels. Other: Small to moderate volume ascites in the right upper quadrant IMPRESSION: 1. Liver cirrhosis with marked echogenic liver parenchyma/fatty infiltration. Unable to document patency of portal vein on this exam, portal vessel poorly visible due to reasons discussed above. 2. Small moderate volume ascites in the right upper quadrant Electronically Signed   By: Jasmine Pang M.D.   On: 08/07/2023 20:28   CT Head Wo Contrast Result Date: 08/07/2023 CLINICAL DATA:  Answered mental status EXAM: CT HEAD WITHOUT CONTRAST TECHNIQUE: Contiguous axial images were obtained from the base of the skull through the vertex without intravenous contrast. RADIATION DOSE REDUCTION: This exam was performed according to the departmental dose-optimization program which includes automated exposure control, adjustment of the mA and/or kV according to patient size and/or use of iterative reconstruction technique. COMPARISON:  CT brain 09/10/2021 FINDINGS: Brain: No evidence of acute infarction, hemorrhage, hydrocephalus, extra-axial collection or mass lesion/mass effect. Vascular: No hyperdense vessel or unexpected calcification. Skull: Normal. Negative for fracture or focal lesion. Sinuses/Orbits: No acute finding. Other: None. IMPRESSION: Negative non contrasted CT appearance of the brain. Electronically Signed   By: Jasmine Pang M.D.   On: 08/07/2023 18:48   DG Abd Portable 1V Result Date: 08/07/2023 CLINICAL DATA:  Intubated EXAM: PORTABLE ABDOMEN - 1 VIEW COMPARISON:  None Available. FINDINGS: Enteric tube tip near the GE junction, side-port in the region of distal esophagus. Upper gas pattern is  unremarkable IMPRESSION: Enteric tube tip near the GE junction, side-port in the region of distal esophagus. Recommend advancement by about 10 cm for more optimal positioning Electronically Signed   By: Jasmine Pang M.D.   On: 08/07/2023 18:46   DG Chest Portable 1 View Result Date: 08/07/2023 CLINICAL DATA:  Altered consciousness.  Status post intubation EXAM: PORTABLE CHEST 1 VIEW COMPARISON:  07/24/2020 FINDINGS: Endotracheal tube terminates at or just above the carina and should be retracted 2.5-3 cm. Nasogastric tube extends beyond the inferior aspect of the film. Patient rotated to the left. Heart size not well evaluated. No pleural effusion or pneumothorax. Lung volumes are low. Bilateral, right greater than left perihilar and upper lobe airspace disease. IMPRESSION: Endotracheal tube terminates added just above the carina and should be retracted 2.5-3 cm. Development of bilateral perihilar and upper lobe airspace disease, with pattern favoring alveolar edema. Multifocal infection or aspiration could look similar. Electronically Signed   By: Jeronimo Greaves M.D.   On: 08/07/2023 18:29    Microbiology Recent Results (from the past 240 hours)  Culture, blood (Routine X 2) w Reflex to ID Panel     Status: None   Collection Time: 08/07/23  4:06 PM   Specimen: BLOOD  Result Value Ref Range Status   Specimen Description   Final    BLOOD LEFT ANTECUBITAL Performed at [facility], [address], [place], [place] [address]    Special Requests   Final    BOTTLES DRAWN AEROBIC AND ANAEROBIC Blood Culture adequate volume Performed at [facility], [address] [place], [place] [address]    Culture   Final    NO GROWTH  5 DAYS Performed at [facility], [address] [place], [place] [address]    Report Status 08/12/2023 FINAL  Final  Resp panel by RT-PCR (RSV, Flu A&B, Covid) Anterior Nasal Swab     Status: None   Collection Time: 08/07/23  7:14 PM    Specimen: Anterior Nasal Swab  Result Value Ref Range Status   SARS Coronavirus 2 by RT PCR NEGATIVE NEGATIVE Final    Comment: (NOTE) SARS-CoV-2 target nucleic acids are NOT DETECTED.  The SARS-CoV-2 RNA is generally detectable in upper respiratory specimens during the acute phase of infection. The lowest concentration of SARS-CoV-2 viral copies this assay can detect is 138 copies/mL. A negative result does not preclude SARS-Cov-2 infection and should not be used as the sole basis for treatment or other patient management decisions. A negative result may occur with  improper specimen collection/handling, submission of specimen other than nasopharyngeal swab, presence of viral mutation(s) within the areas targeted by this assay, and inadequate number of viral copies(<138 copies/mL). A negative result must be combined with clinical observations, patient history, and epidemiological information. The expected result is Negative.  Fact Sheet for Patients:  BloggerCourse.com  Fact Sheet for Healthcare Providers:  SeriousBroker.it  This test is no t yet approved or cleared by the [place] FDA and  has been authorized for detection and/or diagnosis of SARS-CoV-2 by FDA under an Emergency Use Authorization (EUA). This EUA will remain  in effect (meaning this test can be used) for the duration of the COVID-19 declaration under Section 564(b)(1) of the Act, 21 U.S.C.section 360bbb-3(b)(1), unless the authorization is terminated  or revoked sooner.       Influenza A by PCR NEGATIVE NEGATIVE Final   Influenza B by PCR NEGATIVE NEGATIVE Final    Comment: (NOTE) The Xpert Xpress SARS-CoV-2/FLU/RSV plus assay is intended as an aid in the diagnosis of influenza from Nasopharyngeal swab specimens and should not be used as a sole basis for treatment. Nasal washings and aspirates are unacceptable for Xpert Xpress  SARS-CoV-2/FLU/RSV testing.  Fact Sheet for Patients: BloggerCourse.com  Fact Sheet for Healthcare Providers: SeriousBroker.it  This test is not yet approved or cleared by the [place] FDA and has been authorized for detection and/or diagnosis of SARS-CoV-2 by FDA under an Emergency Use Authorization (EUA). This EUA will remain in effect (meaning this test can be used) for the duration of the COVID-19 declaration under Section 564(b)(1) of the Act, 21 U.S.C. section 360bbb-3(b)(1), unless the authorization is terminated or revoked.     Resp Syncytial Virus by PCR NEGATIVE NEGATIVE Final    Comment: (NOTE) Fact Sheet for Patients: BloggerCourse.com  Fact Sheet for Healthcare Providers: SeriousBroker.it  This test is not yet approved or cleared by the [place] FDA and has been authorized for detection and/or diagnosis of SARS-CoV-2 by FDA under an Emergency Use Authorization (EUA). This EUA will remain in effect (meaning this test can be used) for the duration of the COVID-19 declaration under Section 564(b)(1) of the Act, 21 U.S.C. section 360bbb-3(b)(1), unless the authorization is terminated or revoked.  Performed at [facility], [address] [place], [place] [address]   Respiratory (~20 pathogens) panel by PCR     Status: None   Collection Time: 08/07/23  7:14 PM   Specimen: Nasopharyngeal Swab; Respiratory  Result Value Ref Range Status   Adenovirus NOT DETECTED NOT DETECTED Final   Coronavirus 229E NOT DETECTED NOT DETECTED Final    Comment: (NOTE)  The Coronavirus on the Respiratory Panel, DOES NOT test for the novel  Coronavirus (2019 nCoV)    Coronavirus HKU1 NOT DETECTED NOT DETECTED Final   Coronavirus NL63 NOT DETECTED NOT DETECTED Final   Coronavirus OC43 NOT DETECTED NOT DETECTED Final   Metapneumovirus NOT DETECTED NOT  DETECTED Final   Rhinovirus / Enterovirus NOT DETECTED NOT DETECTED Final   Influenza A NOT DETECTED NOT DETECTED Final   Influenza B NOT DETECTED NOT DETECTED Final   Parainfluenza Virus 1 NOT DETECTED NOT DETECTED Final   Parainfluenza Virus 2 NOT DETECTED NOT DETECTED Final   Parainfluenza Virus 3 NOT DETECTED NOT DETECTED Final   Parainfluenza Virus 4 NOT DETECTED NOT DETECTED Final   Respiratory Syncytial Virus NOT DETECTED NOT DETECTED Final   Bordetella pertussis NOT DETECTED NOT DETECTED Final   Bordetella Parapertussis NOT DETECTED NOT DETECTED Final   Chlamydophila pneumoniae NOT DETECTED NOT DETECTED Final   Mycoplasma pneumoniae NOT DETECTED NOT DETECTED Final    Comment: Performed at [facility], [address], [place], [place] [address]  Culture, blood (Routine X 2) w Reflex to ID Panel     Status: None   Collection Time: 08/07/23  9:46 PM   Specimen: BLOOD RIGHT HAND  Result Value Ref Range Status   Specimen Description   Final    BLOOD RIGHT HAND Performed at [facility], [address], [place], [place] [address]    Special Requests   Final    BOTTLES DRAWN AEROBIC ONLY Blood Culture results may not be optimal due to an inadequate volume of blood received in culture bottles Performed at [facility], [address] [place], [place] [address]    Culture   Final    NO GROWTH 5 DAYS Performed at [facility], [address] [place], [place] [address]    Report Status 08/13/2023 FINAL  Final  Body fluid culture w Gram Stain     Status: None   Collection Time: 08/12/23 10:25 AM   Specimen: Peritoneal Cavity; Peritoneal Fluid  Result Value Ref Range Status   Specimen Description   Final    PERITONEAL CAVITY Performed at [facility], [address], [place], [place] [address]    Special Requests   Final    NONE Performed at [facility], [address], [place], [place] [address]     Gram Stain NO WBC SEEN NO ORGANISMS SEEN   Final   Culture   Final    NO GROWTH 3 DAYS Performed at [facility], [address], [place], [place] [address]    Report Status 08/15/2023 FINAL  Final  Culture, Respiratory w Gram Stain     Status: None   Collection Time: 08/13/23  2:45 PM   Specimen: Tracheal Aspirate; Respiratory  Result Value Ref Range Status   Specimen Description   Final    TRACHEAL ASPIRATE Performed at [facility], [address], [place], [place] [address]    Special Requests   Final    NONE Performed at [facility], [address] [place], [place] [address]    Gram Stain   Final    FEW WBC PRESENT, PREDOMINANTLY PMN RARE BUDDING YEAST SEEN Performed at [facility], [address], [place], [place] [address]    Culture ABUNDANT CANDIDA ALBICANS  Final   Report Status 08/16/2023 FINAL  Final  MRSA Next Gen by PCR,  Nasal     Status: None   Collection Time: 08/13/23  6:42 PM   Specimen: Nasal Mucosa; Nasal Swab  Result Value Ref Range Status   MRSA by PCR Next Gen NOT DETECTED NOT DETECTED Final    Comment: (NOTE) The GeneXpert MRSA Assay (FDA approved for NASAL specimens only), is one component of a comprehensive MRSA colonization surveillance program. It is not intended to diagnose MRSA infection nor to guide or monitor treatment for MRSA infections. Test performance is not FDA approved in patients less than 54 years old. Performed at [facility], [address] [place], [place] [address]     Lab Basic Metabolic Panel: Recent Labs  Lab 08/13/23 0403 08/14/23 0350 08/14/23 1734 08/15/23 0539 08/16/23 0615 08/16/23 0616 08/25/2023 0602  NA 142 147*  146*  --  150*  --  149* 149*  149*  K 3.3* 3.5  3.4*  --  2.9*  --  3.4* 3.4*  3.4*  CL 106 109  108  --  112*  --  113* 114*  114*  CO2 23 22  24   --  26  --  25 22  21*  GLUCOSE 144* 154*  157*  --  148*  --   116* 155*  154*  BUN 35* 37*  36*  --  38*  --  46* 46*  47*  CREATININE 1.74* 1.83*  1.85*  --  1.54*  --  1.46* 1.60*  1.52*  CALCIUM 9.2 9.2  9.2  --  9.6  --  9.3 8.9  8.9  MG 2.0 1.8 1.9 1.9 1.9  --  2.1  PHOS 3.9  4.0 4.2  4.2 3.9 3.7  3.7  --  4.0 3.6   Liver Function Tests: Recent Labs  Lab 08/11/23 0533 08/12/23 0310 08/13/23 0403 08/14/23 0350 08/15/23 0539 08/16/23 0616 08/30/2023 0602  AST 40 42* 51* 57*  --   --   --   ALT 22 27 28 31   --   --   --   ALKPHOS 78 89 65 60  --   --   --   BILITOT 3.3* 3.0* 2.4* 2.8*  --   --   --   PROT 5.3* 5.6* 5.6* 6.1*  --   --   --   ALBUMIN 2.4*  2.4* 2.3*  2.3* 3.1*  3.1* 3.7  3.7 3.3* 3.1* 2.6*   No results for input(s): "LIPASE", "AMYLASE" in the last 168 hours. Recent Labs  Lab 08/10/23 1455 08/14/23 0350  AMMONIA 24 19   CBC: Recent Labs  Lab 08/12/23 0310 08/13/23 0403 08/14/23 0350 08/16/23 0911 08/22/2023 0602  WBC 15.7* 19.5* 27.3* 20.4* 19.9*  NEUTROABS 11.9*  --   --   --   --   HGB 8.5* 8.0* 7.2* 8.0* 7.0*  HCT 26.0* 25.1* 22.9* 26.5* 22.9*  MCV 87.8 90.9 93.5 97.1 96.6  PLT 99* 74* 61* 68* 75*   Cardiac Enzymes: No results for input(s): "CKTOTAL", "CKMB", "CKMBINDEX", "TROPONINI" in the last 168 hours. Sepsis Labs: Recent Labs  Lab 08/11/23 1548 08/12/23 0310 08/13/23 0403 08/13/23 1411 08/14/23 0350 08/16/23 0911 08/26/2023 0602  PROCALCITON  --   --   --  1.58 2.81  --   --   WBC 16.7*   < > 19.5*  --  27.3* 20.4* 19.9*  LATICACIDVEN 1.5  --   --  2.4* 2.2*  --   --    < > = values in  this interval not displayed.    Procedures/Operations  Intubation 3/22 Paracentesis 3/27   Lidia Collum 08/22/2023, 11:12 AM

## 2023-09-16 NOTE — Progress Notes (Signed)
 Speech Language Pathology Discharge Patient Details Name: Elizabeth Bush MRN: [id number] DOB: [birth year] Today's Date: 09/11/2023 Time:  -     Patient discharged from SLP services secondary to medical decline - will need to re-order SLP to resume therapy services.  Please see latest therapy progress note for current level of functioning and progress toward goals.    Progress and discharge plan discussed with patient and/or caregiver: Patient unable to participate in discharge planning and no caregivers available  Rolena Infante, MS [facility] SLP Acute Rehab Services Office [phone]  Chales Abrahams 09/01/2023, 10:04 AM

## 2023-09-16 NOTE — Progress Notes (Signed)
 Physical Therapy Discharge Patient Details Name: Elizabeth Bush MRN: [id number] DOB: Jan 12, 1971 Today's Date: 08/18/2023 Time:  -     Patient discharged from PT services secondary to medical decline - will need to re-order PT to resume therapy services. Patient is transitioned to Comfort Care measures.  Please see latest therapy progress note for current level of functioning and progress toward goals.    Progress and discharge plan discussed with patient and/or caregiver: N/A  Elizabeth Bush    Elizabeth Bush PT Acute Rehabilitation Services Office [phone]   Elizabeth Bush 08/25/2023, 9:59 AM

## 2023-09-16 NOTE — Progress Notes (Signed)
 1 unit of PRBC given under downtime protocol due to emergent need.  Green sheet completed and placed in chart.  2 RN verified, vitals filed as per normal protocol.

## 2023-09-16 NOTE — IPAL (Signed)
  Interdisciplinary Goals of Care Family Meeting   Date carried out: 09/03/2023  Location of the meeting: Bedside  Member's involved: Nurse Practitioner, Bedside Registered Nurse, and Family Member or next of kin  Durable Power of Attorney or acting medical decision maker: daughter [name]   Discussion: We discussed goals of care for [employer] .  Spoke w/ daughters and son at bedside. Patient now maxed on 3 pressors getting 3rd unit of PRBC and still hypotensive. Patient now having pain and increased wob w/ RR in 40s. Decision made to start pain medicine and transition to comfort care. Will get morphine drip initiated and then will start titrating off pressors.   Code status:   Code Status: Do not attempt resuscitation (DNR) - Comfort care   Disposition: In-patient comfort care  Time spent for the meeting: 35 minutes    Lidia Collum, PA-C  08/31/2023, 9:48 AM

## 2023-09-16 NOTE — Progress Notes (Signed)
 Nutrition Brief Note  Chart reviewed. Pt now transitioning to comfort care.  No further nutrition interventions planned at this time.  Please re-consult as needed.   Shelle Iron RD, LDN Contact via [employer].

## 2023-09-16 NOTE — Progress Notes (Signed)
 OT Cancellation Note  Patient Details Name: Elizabeth Bush MRN: [id number] DOB: August 22, 1970   Cancelled Treatment:    Reason Eval/Treat Not Completed: Other (comment) Patient is transitioning to comfort care at this time. Please see previous OT note for recommendations.  Rosalio Loud, MS Acute Rehabilitation Department Office# [phone]  08/27/2023, 10:04 AM

## 2023-09-16 NOTE — TOC Progression Note (Signed)
 Transition of Care [facility]) - Progression Note    Patient Details  Name: Elizabeth Bush MRN: [id number] Date of Birth: Nov 10, 1970  Transition [facility] CM/SW Contact  Kynslie Ringle, Olegario Messier, RN Phone Number: 08/26/2023, 11:03 AM  Clinical Narrative:  Noted comfort care.On morphine drip.     Expected Discharge Plan:  (TBD) Barriers to Discharge: Other (must enter comment) (comfort care)  Expected Discharge Plan and Services                                               Social Determinants of Health (SDOH) Interventions SDOH Screenings   Food Insecurity: Patient Unable To Answer (08/08/2023)  Housing: Unknown (06/21/2023)  Recent Concern: Housing - Medium Risk (04/22/2023)  Transportation Needs: No Transportation Needs (06/21/2023)  Recent Concern: Transportation Needs - Unmet Transportation Needs (04/22/2023)  Utilities: Not At Risk (06/21/2023)  Alcohol Screen: Medium Risk (04/22/2023)  Depression (PHQ2-9): High Risk (04/22/2023)  Financial Resource Strain: Medium Risk (04/22/2023)  Physical Activity: Inactive (04/22/2023)  Social Connections: Moderately Isolated (06/21/2023)  Stress: Stress Concern Present (04/22/2023)  Tobacco Use: High Risk (08/08/2023)  Health Literacy: Inadequate Health Literacy (04/22/2023)    Readmission Risk Interventions    06/23/2023   10:33 AM  Readmission Risk Prevention Plan  Post Dischage Appt Complete  Medication Screening Complete  Transportation Screening Complete

## 2023-09-16 NOTE — Progress Notes (Signed)
 Mild malnutrition present, decreased albumin

## 2023-09-16 DEATH — deceased
